# Patient Record
Sex: Male | Born: 1935
Health system: Southern US, Community
[De-identification: ages and names within clinical notes are randomized; demographics above are authoritative.]

## PROBLEM LIST (undated history)

## (undated) DIAGNOSIS — I4892 Unspecified atrial flutter: Secondary | ICD-10-CM

## (undated) DIAGNOSIS — E119 Type 2 diabetes mellitus without complications: Secondary | ICD-10-CM

## (undated) DIAGNOSIS — N529 Male erectile dysfunction, unspecified: Secondary | ICD-10-CM

## (undated) DIAGNOSIS — M1990 Unspecified osteoarthritis, unspecified site: Secondary | ICD-10-CM

## (undated) DIAGNOSIS — I2699 Other pulmonary embolism without acute cor pulmonale: Secondary | ICD-10-CM

## (undated) DIAGNOSIS — I428 Other cardiomyopathies: Secondary | ICD-10-CM

## (undated) DIAGNOSIS — I509 Heart failure, unspecified: Secondary | ICD-10-CM

## (undated) DIAGNOSIS — I447 Left bundle-branch block, unspecified: Secondary | ICD-10-CM

## (undated) DIAGNOSIS — I951 Orthostatic hypotension: Secondary | ICD-10-CM

## (undated) DIAGNOSIS — K635 Polyp of colon: Secondary | ICD-10-CM

## (undated) DIAGNOSIS — I10 Essential (primary) hypertension: Secondary | ICD-10-CM

## (undated) DIAGNOSIS — E785 Hyperlipidemia, unspecified: Secondary | ICD-10-CM

## (undated) DIAGNOSIS — Z950 Presence of cardiac pacemaker: Secondary | ICD-10-CM

## (undated) DIAGNOSIS — B019 Varicella without complication: Secondary | ICD-10-CM

## (undated) DIAGNOSIS — I495 Sick sinus syndrome: Secondary | ICD-10-CM

## (undated) DIAGNOSIS — M5020 Other cervical disc displacement, unspecified cervical region: Secondary | ICD-10-CM

## (undated) DIAGNOSIS — N400 Enlarged prostate without lower urinary tract symptoms: Secondary | ICD-10-CM

## (undated) DIAGNOSIS — D039 Melanoma in situ, unspecified: Secondary | ICD-10-CM

## (undated) HISTORY — PX: CYSTOSCOPY W/ TRANSURETHRAL RESECTION OF POSTERIOR URETHERAL VALVES: SHX1428

## (undated) HISTORY — PX: PACEMAKER INSERTION: SHX728

## (undated) HISTORY — DX: Type 2 diabetes mellitus without complications: E11.9

## (undated) HISTORY — DX: Male erectile dysfunction, unspecified: N52.9

## (undated) HISTORY — DX: Presence of cardiac pacemaker: Z95.0

## (undated) HISTORY — DX: Sick sinus syndrome: I49.5

## (undated) HISTORY — DX: Hyperlipidemia, unspecified: E78.5

## (undated) HISTORY — DX: Other cardiomyopathies: I42.8

## (undated) HISTORY — DX: Gilbert syndrome: E80.4

## (undated) HISTORY — DX: Left bundle-branch block, unspecified: I44.7

## (undated) HISTORY — DX: Polyp of colon: K63.5

## (undated) HISTORY — DX: Heart failure, unspecified: I50.9

## (undated) HISTORY — DX: Essential (primary) hypertension: I10

## (undated) HISTORY — DX: Varicella without complication: B01.9

## (undated) HISTORY — PX: CARDIOVERSION: SHX1299

## (undated) HISTORY — DX: Other pulmonary embolism without acute cor pulmonale: I26.99

## (undated) HISTORY — DX: Other cervical disc displacement, unspecified cervical region: M50.20

## (undated) HISTORY — DX: Orthostatic hypotension: I95.1

## (undated) HISTORY — DX: Melanoma in situ, unspecified: D03.9

## (undated) HISTORY — DX: Benign prostatic hyperplasia without lower urinary tract symptoms: N40.0

## (undated) HISTORY — DX: Unspecified osteoarthritis, unspecified site: M19.90

## (undated) HISTORY — DX: Unspecified atrial flutter: I48.92

## (undated) HISTORY — PX: OTHER SURGICAL HISTORY: SHX169

---

## 2002-10-21 ENCOUNTER — Encounter: Admission: RE | Admit: 2002-10-21 | Discharge: 2002-10-21 | Payer: Self-pay | Admitting: Orthopedic Surgery

## 2002-10-21 ENCOUNTER — Encounter: Payer: Self-pay | Admitting: Orthopedic Surgery

## 2002-11-26 ENCOUNTER — Emergency Department (HOSPITAL_COMMUNITY): Admission: EM | Admit: 2002-11-26 | Discharge: 2002-11-26 | Payer: Self-pay | Admitting: Emergency Medicine

## 2004-08-09 ENCOUNTER — Ambulatory Visit: Payer: Self-pay | Admitting: Cardiology

## 2004-08-09 ENCOUNTER — Inpatient Hospital Stay (HOSPITAL_COMMUNITY): Admission: EM | Admit: 2004-08-09 | Discharge: 2004-08-12 | Payer: Self-pay | Admitting: Emergency Medicine

## 2004-08-10 ENCOUNTER — Encounter: Payer: Self-pay | Admitting: Cardiology

## 2004-08-29 ENCOUNTER — Ambulatory Visit: Payer: Self-pay

## 2004-08-29 ENCOUNTER — Ambulatory Visit: Payer: Self-pay | Admitting: Internal Medicine

## 2004-11-29 ENCOUNTER — Ambulatory Visit: Payer: Self-pay | Admitting: Internal Medicine

## 2004-12-07 ENCOUNTER — Ambulatory Visit: Payer: Self-pay

## 2005-02-01 ENCOUNTER — Ambulatory Visit: Payer: Self-pay | Admitting: Gastroenterology

## 2005-02-16 ENCOUNTER — Ambulatory Visit: Payer: Self-pay | Admitting: Gastroenterology

## 2005-04-26 ENCOUNTER — Ambulatory Visit: Payer: Self-pay | Admitting: Cardiology

## 2005-08-21 ENCOUNTER — Ambulatory Visit: Payer: Self-pay

## 2006-01-10 ENCOUNTER — Emergency Department (HOSPITAL_COMMUNITY): Admission: EM | Admit: 2006-01-10 | Discharge: 2006-01-10 | Payer: Self-pay | Admitting: Emergency Medicine

## 2006-09-03 ENCOUNTER — Ambulatory Visit: Payer: Self-pay

## 2006-12-06 ENCOUNTER — Emergency Department (HOSPITAL_COMMUNITY): Admission: EM | Admit: 2006-12-06 | Discharge: 2006-12-07 | Payer: Self-pay | Admitting: Emergency Medicine

## 2006-12-19 ENCOUNTER — Encounter (INDEPENDENT_AMBULATORY_CARE_PROVIDER_SITE_OTHER): Payer: Self-pay | Admitting: Urology

## 2006-12-19 ENCOUNTER — Ambulatory Visit (HOSPITAL_COMMUNITY): Admission: RE | Admit: 2006-12-19 | Discharge: 2006-12-20 | Payer: Self-pay | Admitting: Urology

## 2006-12-25 ENCOUNTER — Emergency Department (HOSPITAL_COMMUNITY): Admission: EM | Admit: 2006-12-25 | Discharge: 2006-12-25 | Payer: Self-pay | Admitting: Emergency Medicine

## 2006-12-25 ENCOUNTER — Ambulatory Visit: Payer: Self-pay | Admitting: Internal Medicine

## 2007-04-26 ENCOUNTER — Ambulatory Visit: Payer: Self-pay | Admitting: Cardiology

## 2007-05-01 ENCOUNTER — Encounter: Payer: Self-pay | Admitting: Cardiology

## 2007-05-01 ENCOUNTER — Ambulatory Visit: Payer: Self-pay

## 2007-05-23 ENCOUNTER — Ambulatory Visit: Payer: Self-pay | Admitting: Cardiology

## 2007-06-05 ENCOUNTER — Ambulatory Visit: Payer: Self-pay | Admitting: Internal Medicine

## 2007-06-25 ENCOUNTER — Ambulatory Visit: Payer: Self-pay | Admitting: Cardiology

## 2007-08-08 ENCOUNTER — Ambulatory Visit: Payer: Self-pay | Admitting: Cardiology

## 2007-08-21 ENCOUNTER — Ambulatory Visit: Payer: Self-pay

## 2007-09-16 ENCOUNTER — Ambulatory Visit: Payer: Self-pay | Admitting: Cardiology

## 2007-11-20 ENCOUNTER — Ambulatory Visit: Payer: Self-pay | Admitting: Internal Medicine

## 2007-11-22 ENCOUNTER — Ambulatory Visit: Payer: Self-pay | Admitting: Cardiology

## 2008-02-18 ENCOUNTER — Ambulatory Visit: Payer: Self-pay | Admitting: Cardiology

## 2008-02-19 ENCOUNTER — Inpatient Hospital Stay (HOSPITAL_COMMUNITY): Admission: EM | Admit: 2008-02-19 | Discharge: 2008-02-24 | Payer: Self-pay | Admitting: Emergency Medicine

## 2008-02-20 ENCOUNTER — Ambulatory Visit: Payer: Self-pay | Admitting: Vascular Surgery

## 2008-02-20 ENCOUNTER — Encounter (INDEPENDENT_AMBULATORY_CARE_PROVIDER_SITE_OTHER): Payer: Self-pay | Admitting: Internal Medicine

## 2008-02-27 ENCOUNTER — Ambulatory Visit: Payer: Self-pay

## 2008-03-19 ENCOUNTER — Ambulatory Visit: Payer: Self-pay | Admitting: Cardiology

## 2008-04-01 ENCOUNTER — Ambulatory Visit: Payer: Self-pay | Admitting: Internal Medicine

## 2008-04-03 ENCOUNTER — Ambulatory Visit (HOSPITAL_COMMUNITY): Admission: RE | Admit: 2008-04-03 | Discharge: 2008-04-03 | Payer: Self-pay | Admitting: Cardiology

## 2008-06-03 ENCOUNTER — Encounter: Payer: Self-pay | Admitting: Internal Medicine

## 2008-06-03 ENCOUNTER — Ambulatory Visit: Payer: Self-pay | Admitting: Internal Medicine

## 2008-06-17 ENCOUNTER — Encounter: Payer: Self-pay | Admitting: Internal Medicine

## 2008-06-22 ENCOUNTER — Ambulatory Visit: Payer: Self-pay | Admitting: Internal Medicine

## 2008-08-27 DIAGNOSIS — I82409 Acute embolism and thrombosis of unspecified deep veins of unspecified lower extremity: Secondary | ICD-10-CM | POA: Insufficient documentation

## 2008-08-27 DIAGNOSIS — I2699 Other pulmonary embolism without acute cor pulmonale: Secondary | ICD-10-CM

## 2008-08-27 DIAGNOSIS — I509 Heart failure, unspecified: Secondary | ICD-10-CM | POA: Insufficient documentation

## 2008-08-27 DIAGNOSIS — I428 Other cardiomyopathies: Secondary | ICD-10-CM

## 2008-08-27 DIAGNOSIS — E785 Hyperlipidemia, unspecified: Secondary | ICD-10-CM | POA: Insufficient documentation

## 2008-08-27 DIAGNOSIS — K649 Unspecified hemorrhoids: Secondary | ICD-10-CM | POA: Insufficient documentation

## 2008-08-31 ENCOUNTER — Ambulatory Visit: Payer: Self-pay | Admitting: Internal Medicine

## 2008-08-31 DIAGNOSIS — Z860100 Personal history of colon polyps, unspecified: Secondary | ICD-10-CM | POA: Insufficient documentation

## 2008-08-31 DIAGNOSIS — R159 Full incontinence of feces: Secondary | ICD-10-CM | POA: Insufficient documentation

## 2008-09-03 ENCOUNTER — Ambulatory Visit: Payer: Self-pay | Admitting: Internal Medicine

## 2008-09-03 ENCOUNTER — Encounter: Payer: Self-pay | Admitting: Internal Medicine

## 2008-09-07 ENCOUNTER — Encounter: Payer: Self-pay | Admitting: Internal Medicine

## 2008-09-09 ENCOUNTER — Telehealth: Payer: Self-pay | Admitting: Internal Medicine

## 2008-09-14 ENCOUNTER — Ambulatory Visit: Payer: Self-pay

## 2008-09-14 ENCOUNTER — Encounter: Payer: Self-pay | Admitting: Internal Medicine

## 2008-10-01 ENCOUNTER — Encounter: Payer: Self-pay | Admitting: Internal Medicine

## 2008-10-06 ENCOUNTER — Encounter: Payer: Self-pay | Admitting: Internal Medicine

## 2008-10-06 ENCOUNTER — Ambulatory Visit: Payer: Self-pay | Admitting: Internal Medicine

## 2008-10-06 DIAGNOSIS — R55 Syncope and collapse: Secondary | ICD-10-CM

## 2008-10-06 DIAGNOSIS — I447 Left bundle-branch block, unspecified: Secondary | ICD-10-CM

## 2008-10-06 DIAGNOSIS — I495 Sick sinus syndrome: Secondary | ICD-10-CM | POA: Insufficient documentation

## 2008-12-03 ENCOUNTER — Ambulatory Visit: Payer: Self-pay | Admitting: Internal Medicine

## 2008-12-30 ENCOUNTER — Encounter: Payer: Self-pay | Admitting: Internal Medicine

## 2009-01-05 ENCOUNTER — Encounter: Payer: Self-pay | Admitting: Internal Medicine

## 2009-01-06 ENCOUNTER — Ambulatory Visit: Payer: Self-pay | Admitting: Internal Medicine

## 2009-03-24 ENCOUNTER — Encounter (INDEPENDENT_AMBULATORY_CARE_PROVIDER_SITE_OTHER): Payer: Self-pay | Admitting: *Deleted

## 2009-04-09 ENCOUNTER — Encounter: Payer: Self-pay | Admitting: Internal Medicine

## 2009-04-11 ENCOUNTER — Encounter: Payer: Self-pay | Admitting: Internal Medicine

## 2009-04-12 ENCOUNTER — Ambulatory Visit: Payer: Self-pay | Admitting: Internal Medicine

## 2009-04-14 ENCOUNTER — Telehealth: Payer: Self-pay | Admitting: Internal Medicine

## 2009-04-26 ENCOUNTER — Encounter: Payer: Self-pay | Admitting: Internal Medicine

## 2009-05-24 ENCOUNTER — Ambulatory Visit: Payer: Self-pay | Admitting: Cardiology

## 2009-05-24 ENCOUNTER — Encounter: Payer: Self-pay | Admitting: Internal Medicine

## 2009-05-24 ENCOUNTER — Ambulatory Visit (HOSPITAL_COMMUNITY): Admission: RE | Admit: 2009-05-24 | Discharge: 2009-05-24 | Payer: Self-pay | Admitting: Internal Medicine

## 2009-05-24 ENCOUNTER — Ambulatory Visit: Payer: Self-pay | Admitting: Internal Medicine

## 2009-05-24 DIAGNOSIS — I10 Essential (primary) hypertension: Secondary | ICD-10-CM | POA: Insufficient documentation

## 2009-07-09 ENCOUNTER — Encounter: Payer: Self-pay | Admitting: Internal Medicine

## 2009-07-10 ENCOUNTER — Encounter: Payer: Self-pay | Admitting: Internal Medicine

## 2009-07-14 ENCOUNTER — Ambulatory Visit: Payer: Self-pay | Admitting: Internal Medicine

## 2009-07-16 ENCOUNTER — Encounter: Payer: Self-pay | Admitting: Internal Medicine

## 2009-07-21 ENCOUNTER — Encounter: Payer: Self-pay | Admitting: Internal Medicine

## 2009-09-08 ENCOUNTER — Encounter: Payer: Self-pay | Admitting: Internal Medicine

## 2009-09-27 ENCOUNTER — Encounter: Payer: Self-pay | Admitting: Internal Medicine

## 2009-09-28 ENCOUNTER — Ambulatory Visit: Payer: Self-pay | Admitting: Internal Medicine

## 2009-11-17 ENCOUNTER — Ambulatory Visit: Payer: Self-pay | Admitting: Internal Medicine

## 2009-12-30 ENCOUNTER — Ambulatory Visit: Payer: Self-pay | Admitting: Internal Medicine

## 2010-01-02 ENCOUNTER — Encounter: Payer: Self-pay | Admitting: Internal Medicine

## 2010-01-05 ENCOUNTER — Encounter: Payer: Self-pay | Admitting: Internal Medicine

## 2010-04-10 ENCOUNTER — Encounter: Payer: Self-pay | Admitting: Internal Medicine

## 2010-04-15 ENCOUNTER — Encounter: Payer: Self-pay | Admitting: Internal Medicine

## 2010-04-20 ENCOUNTER — Telehealth (INDEPENDENT_AMBULATORY_CARE_PROVIDER_SITE_OTHER): Payer: Self-pay | Admitting: *Deleted

## 2010-04-20 ENCOUNTER — Telehealth: Payer: Self-pay | Admitting: Internal Medicine

## 2010-04-25 ENCOUNTER — Ambulatory Visit: Payer: Self-pay

## 2010-04-25 ENCOUNTER — Encounter: Payer: Self-pay | Admitting: Internal Medicine

## 2010-05-20 ENCOUNTER — Ambulatory Visit (HOSPITAL_COMMUNITY)
Admission: RE | Admit: 2010-05-20 | Discharge: 2010-05-20 | Payer: Self-pay | Source: Home / Self Care | Admitting: Internal Medicine

## 2010-05-20 ENCOUNTER — Ambulatory Visit: Payer: Self-pay | Admitting: Cardiology

## 2010-05-20 ENCOUNTER — Encounter: Payer: Self-pay | Admitting: Internal Medicine

## 2010-05-20 ENCOUNTER — Ambulatory Visit: Payer: Self-pay | Admitting: Internal Medicine

## 2010-07-19 NOTE — Progress Notes (Signed)
Summary: [facility]  [facility]   Imported By: Cala Bradford Mesiemore 04/20/2010 15:20:03  _____________________________________________________________________  External Attachment:    Type:   Image     Comment:   External Document

## 2010-07-19 NOTE — Cardiovascular Report (Signed)
Summary: Office Visit Remote   Office Visit Remote   Imported By: Roderic Ovens 07/26/2009 16:43:11  _____________________________________________________________________  External Attachment:    Type:   Image     Comment:   External Document

## 2010-07-19 NOTE — Progress Notes (Signed)
Summary: Med List   Med List   Imported By: Roderic Ovens 09/27/2009 15:20:28  _____________________________________________________________________  External Attachment:    Type:   Image     Comment:   External Document

## 2010-07-19 NOTE — Progress Notes (Signed)
   Walk in Patient Form Recieved " Pt left notes from Hospitalization" sent to Herbert Seta S this message was taken on 10/25...and ended up in paperwork..I just received today. [facility] Mesiemore  April 20, 2010 8:48 AM

## 2010-07-19 NOTE — Progress Notes (Signed)
   Phone Note Call from Patient   Summary of Call: pt called about delinquent letter ---device was checked on 04-08-10 at [facility].  device functioning normally.  scheduled pt for next remote transmission on 07-14-10.  pt aware of date. Vella Kohler  April 20, 2010 11:21 AM

## 2010-07-19 NOTE — Assessment & Plan Note (Signed)
Summary: pacer check.mdt.amber  Medications Added VITAMIN D 1000 UNIT  TABS (CHOLECALCIFEROL) once daily      Allergies Added: NKDA  Primary Provider:  Rodrigo Ran MD   History of Present Illness: History of Present Illness: Steven Macdonald is seen in followup for a pacemaker implant for severe bradycardia left bundle branch block and syncope. He has had no recurrent syncope.  He also is a history of pulmonary embolism  He has also had problems with a nonischemic cardiomyopathy with ejection fraction last fall of 31%. Echocardiogram from December demonstrated stable ejection fraction of 45-50%. The patient has modest fatigue but no significant shortness of breath. He is tolerating his medications well.  [place] has [place]     Current Medications (verified): 1)  Simvastatin 40 Mg Tabs (Simvastatin) .... 1/2 Tablet By Mouth Bid 2)  Lisinopril 10 Mg Tabs (Lisinopril) .Marland Kitchen.. 1 By Mouth Two Times A Day 3)  Coumadin 5 Mg Tabs (Warfarin Sodium) .... Take As Directed 4)  Coreg 25 Mg Tabs (Carvedilol) .Marland Kitchen.. 1 Tablet By Mouth Two Times A Day 5)  Viagra 100 Mg Tabs (Sildenafil Citrate) .... As Needed 6)  Fish Oil  Oil (Fish Oil) .... 1000mg  Once Daily 7)  Qc Fiber Laxative 0.52 Gm Caps (Psyllium) .... Take One Tablet By Mouth Once Daily. 8)  Vitamin D 1000 Unit  Tabs (Cholecalciferol) .... Once Daily  Allergies (verified): No Known Drug Allergies  Vital Signs:  Patient profile:   76 year old male Height:      76 inches Weight:      183 pounds BMI:     22.36 Pulse rate:   69 / minute BP sitting:   109 / 62  (right arm) Cuff size:   regular  Vitals Entered By: Steven Macdonald, RMA (September 28, 2009 11:08 AM)  Physical Exam  General:  The patient was alert and oriented in no acute distress. HEENT Normal.  Neck veins were flat, carotids were brisk.  Lungs were clear.  Heart sounds were regular without murmurs or gallops.  Abdomen was soft  with active bowel sounds. There is no clubbing cyanosis or edema. Skin Warm and dry    PPM Specifications Following MD:  Steven Manges, MD     PPM Vendor:  Medtronic     PPM Model Number:  [id number]     PPM Serial Number:  [id number] PPM DOI:  08/11/2004     PPM Implanting MD:  Steven Manges, MD  Lead 1    Location: RA     DOI: 08/11/2004     Model #: [id number]     Serial #: [id number]     Status: active Lead 2    Location: RV     DOI: 08/11/2004     Model #: 3875     Serial #: [id number]     Status: active  Magnet Response Rate:  BOL 85 ERI 65  Indications:  Syncope; Sinus node dysfunction Carelink   PPM Follow Up Remote Check?  No Battery Voltage:  2.77 V     Battery Est. Longevity:  5 years     Pacer Dependent:  Yes       PPM Device Measurements Atrium  Impedance: 512 ohms, Threshold: 0.5 V at 0.4 msec Right Ventricle  Amplitude: 5.6 mV, Impedance: 570 ohms, Threshold: 0.75 V at 0.4 msec  Episodes MS Episodes:  0     Percent Mode Switch:  0  Coumadin:  Yes Ventricular High Rate:  0     Atrial Pacing:  97.6%     Ventricular Pacing:  0.3%  Parameters Mode:  DDDR     Lower Rate Limit:  60     Upper Rate Limit:  140 Paced AV Delay:  180     Sensed AV Delay:  150 Next Remote Date:  12/30/2009     Next Cardiology Appt Due:  09/18/2010 Tech Comments:  2.5x safety margin for atrial autocapture.  Carelink transmissions every 3 months.  Checked by [employer].  ROV 1 year with Dr. Graciela Macdonald. Steven Harm, LPN  September 28, 2009 11:21 AM   Impression & Recommendations:  Problem # 1:  PACEMAKER, PERMANENT, MEDTRONIC ENPULSE (ICD-V45.01) Device parameters and data were reviewed and no changes were made  Problem # 2:  CARDIOMYOPATHY (ICD-425.4) largely improved ejection fraction of 50%    Problem # 3:  SICK SINUS/ TACHY-BRADY SYNDROME (ICD-427.81) newly 100% atrial paced with intrinsic intnsic conduction His updated medication list for this problem includes:    Lisinopril 10 Mg Tabs  (Lisinopril) .Marland Kitchen... 1 by mouth two times a day    Coumadin 5 Mg Tabs (Warfarin sodium) .Marland Kitchen... Take as directed    Coreg 25 Mg Tabs (Carvedilol) .Marland Kitchen... 1 tablet by mouth two times a day  Patient Instructions: 1)  Your physician wants you to follow-up in:12 months with Dr Steven Macdonald.   You will receive a reminder letter in the mail two months in advance. If you don't receive a letter, please call our office to schedule the follow-up appointment.

## 2010-07-19 NOTE — Letter (Signed)
Summary: [facility]   Imported By: Marylou Mccoy 02/10/2010 13:46:16  _____________________________________________________________________  External Attachment:    Type:   Image     Comment:   External Document

## 2010-07-19 NOTE — Miscellaneous (Signed)
  Clinical Lists Changes  Observations: Added new observation of ECHOINTERP:   - Left ventricle: There is hypokinesis of the inferior wall and some       dyssynergy related to pacing. There is mild upper septal       thickening. The cavity size was normal. Wall thickness was       increased in a pattern of mild LVH. The estimated ejection       fraction was 55%.     - Aorta: There is mild dilitation of the ascending aorta.     - Mitral valve: Mild regurgitation.     - Right ventricle: Pacer wire or catheter noted in right ventricle.     - Tricuspid valve: Mild regurgitation. (06/03/2009 15:45)      Echocardiogram  Procedure date:  06/03/2009  Findings:        - Left ventricle: There is hypokinesis of the inferior wall and some       dyssynergy related to pacing. There is mild upper septal       thickening. The cavity size was normal. Wall thickness was       increased in a pattern of mild LVH. The estimated ejection       fraction was 55%.     - Aorta: There is mild dilitation of the ascending aorta.     - Mitral valve: Mild regurgitation.     - Right ventricle: Pacer wire or catheter noted in right ventricle.     - Tricuspid valve: Mild regurgitation.

## 2010-07-19 NOTE — Cardiovascular Report (Signed)
Summary: Office Visit    Office Visit    Imported By: Roderic Ovens 09/28/2009 12:41:54  _____________________________________________________________________  External Attachment:    Type:   Image     Comment:   External Document

## 2010-07-19 NOTE — Letter (Signed)
Summary: [facility] Office Note  [facility] Office Note   Imported By: Roderic Ovens 09/27/2009 15:18:04  _____________________________________________________________________  External Attachment:    Type:   Image     Comment:   External Document

## 2010-07-19 NOTE — Letter (Signed)
Summary: Remote Device Check  [employer], Main Office  [address]   [place], [place] [address]   Phone: [phone]  Fax: [phone]     July 21, 2009 MRN: [id number]   [facility] [address] [place], [place]  [address]   Dear Steven Macdonald,   Your remote transmission was recieved and reviewed by your physician.  All diagnostics were within normal limits for you.    __X____Your next office visit is scheduled for:    APRIL 2011 WITH DR Graciela Husbands. Please call our office to schedule an appointment.    Sincerely,  Proofreader

## 2010-07-19 NOTE — Progress Notes (Signed)
Summary: syncopal episode   Phone Note Outgoing Call   Call placed by: Meredith Staggers, RN,  April 20, 2010 3:01 PM Call placed to: Patient Summary of Call: called pt regarding his mess he had left about being in hospital.  He states he was in [place] and had 2 syncopal episodes on 10/21.  He states he had a very busy day and had forgotten to eat lunch, he had a drink before dinner and then 2 glasses of wine w/dinner which is a little more than he normally has.  When he got up to go up to his room he passed out, after he got up he went to elevated and passed out again.  He states EMS was called and he was transported to hospital BP was low at 80/40 for a while, they did a lot of test including labs, cardiac enzymes, and head CT all of which were normal.  He also reports a device rep came in and checked his pacer which was normal as well.  Since then pt has been fine w/no problems, he has been doing normal activities including going to the gym w/no problem.  He is sch to see Dr Gala Romney 12/2 and is fine w/that appt, he just wanted Dr Gala Romney to be aware of what had happened, will forward mess to him.     Appended Document: syncopal episode please have our device guys look at his pacer and confirm no events. thanks  Appended Document: syncopal episode pt aware appt sch for 11/7 at 9:15 w/device clinic

## 2010-07-19 NOTE — Letter (Signed)
Summary: Device-Delinquent Phone Journalist, newspaper, Main Office  [address]   [place], [place] [address]   Phone: [phone]  Fax: [phone]     April 15, 2010 MRN: [id number]   [facility] [address] [place], [place]  [address]   Dear Steven Macdonald,  According to our records, you were scheduled for a device phone transmission on 04-07-2010.     We did not receive any results from this check.  If you transmitted on your scheduled day, please call us to help troubleshoot your system.  If you forgot to send your transmission, please send one upon receipt of this letter.  Thank you,   Architectural technologist [facility]

## 2010-07-19 NOTE — Cardiovascular Report (Signed)
Summary: Office Visit   Office Visit   Imported By: Roderic Ovens 04/28/2010 14:29:16  _____________________________________________________________________  External Attachment:    Type:   Image     Comment:   External Document

## 2010-07-19 NOTE — Assessment & Plan Note (Signed)
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 Normal coronaries on cardiac CT in 209 with EF 31%.  This spring spent 30 [place] on [place] with 50 pound pack. Near end of trip was feeling weak and dizzy. Went to primary care office. CKs elevated so sent to ER. Diagnosed with dehydration and exhaustion. Drank a lot of fluids and went [place] another [place].   Returns for routine f/u. In October was in [place] and was walking for miles. Didn't eat all day until dinner and had a big dinner with wine. After getting up from dinner had syncopal episode. Went back toward hotel room and had recurrent episode.  Went to [employer]. Pacer interrogated and was normal. Cardiac enzymes and head CT all normal.  Now back to his regular routine. Exercising every day without problem. Compliant with all meds. No CP, SOB or edema. Doing very well.   Echo today which I reviewed personally shows EF 30-35%   Current Medications (verified): 1)  Simvastatin 40 Mg Tabs (Simvastatin) .... 1/2 Tablet By Mouth Bid 2)  Lisinopril 10 Mg Tabs (Lisinopril) .Marland Kitchen.. 1 By Mouth Two Times A Day 3)  Coumadin 5 Mg Tabs (Warfarin Sodium) .... Take As Directed 4)  Coreg 25 Mg Tabs (Carvedilol) .Marland Kitchen.. 1 Tablet By Mouth Two Times A Day 5)  Fish Oil  Oil (Fish Oil) .... 1000mg  Once Daily 6)  Qc Fiber Laxative 0.52 Gm Caps (Psyllium) .... Take One Tablet By Mouth Once Daily. 7)  Vitamin D 1000 Unit  Tabs (Cholecalciferol) .... Once Daily  Allergies (verified): No Known Drug Allergies  Past History:  Past Medical  History: Last updated: 05/24/2009 Hyperlipidemia Hypertension Pulmonary Embolism 9/09 Nonischemic cardiomyopsthy   --ECHO 11/08 EF 25%   --Cardiac CT 10/09: EF 31%. Normal coronaries.   --ECHO 3/10: EF 45% Asc aorta mildly dilated 42mm   --ECHO12/10 EF 40-45% LBBB Symptomatic bradycardia s/p pacemaker 2006 Erectile dysfunction Hemorrhoids  Review of Systems       As per HPI and past medical history; otherwise all systems negative.   Vital Signs:  Patient profile:   76 year old male Height:      75.5 inches Weight:      180 pounds BMI:     22.28 Pulse rate:   65 / minute BP sitting:   100 / 70  (left arm)  Vitals Entered By: Laurance Flatten CMA (May 20, 2010 10:22 AM)  Physical Exam  General:  The patient was alert and oriented in no acute distress. HEENT Normal.  Neck veins were flat, carotids were brisk.  Lungs were clear.  Heart sounds were regular without murmurs or gallops.  Abdomen was soft with active bowel sounds. There is no clubbing cyanosis or edema. Skin Warm and dry    PPM Specifications Following MD:  Sherryl Manges, MD     PPM Vendor:  [employer]     PPM Model Number:  [id number]     PPM Serial Number:  [id number] PPM DOI:  08/11/2004     PPM Implanting MD:  Sherryl Manges, MD  Lead 1    Location: RA     DOI: 08/11/2004     Model #: 1191     Serial #: [id number]     Status: active Lead 2    Location: RV     DOI: 08/11/2004     Model #: 3086     Serial #: [id number]     Status: active  Magnet Response Rate:  BOL 85 ERI 65  Indications:  Syncope; Sinus node dysfunction Carelink   PPM Follow Up Pacer Dependent:  Yes      Episodes Coumadin:  Yes  Parameters Mode:  DDDR     Lower Rate Limit:  60     Upper Rate Limit:  140 Paced AV Delay:  180     Sensed AV Delay:  150  Impression & Recommendations:  Problem # 1:  SYNCOPE (ICD-780.2) I suspect this was due to vasodilation and orthostasis. Pacer interrogation reassuring.   Problem # 2:   CARDIOMYOPATHY (ICD-425.4) EF appears sllightly decreased from previous on echo today. However remains NYHA I so not ICD candidate. Will continue current regimen and see him back in 2-3 months with repeat echo. If developing symptoms needs to contact me sooner.   Other Orders: EKG w/ Interpretation (93[address]) Echocardiogram (Echo)  Patient Instructions: 1)  Your physician recommends that you schedule a follow-up appointment in: 3 months for appointment with Dr. Garth Schlatter and echo 2)  Your physician recommends that you continue on your current medications as directed. Please refer to the Current Medication list given to you today. 3)  Your physician has requested that you have an echocardiogram.  Echocardiography is a painless test that uses sound waves to create images of your heart. It provides your doctor with information about the size and shape of your heart and how well your heart's chambers and valves are working.  This procedure takes approximately one hour. There are no restrictions for this procedure.

## 2010-07-19 NOTE — Cardiovascular Report (Signed)
Summary: Office Visit Remote   Office Visit Remote   Imported By: Roderic Ovens 01/06/2010 10:53:32  _____________________________________________________________________  External Attachment:    Type:   Image     Comment:   External Document

## 2010-07-19 NOTE — Letter (Signed)
Summary: Remote Device Check  [employer], Main Office  [address]   [place], [place] [address]   Phone: [phone]  Fax: [phone]     January 05, 2010 MRN: [id number]   [facility] [address] [place], [place]  [address]   Dear Steven Macdonald,   Your remote transmission was recieved and reviewed by your physician.  All diagnostics were within normal limits for you.  __X___Your next transmission is scheduled for:   04-07-2010.  Please transmit at any time this day.  If you have a wireless device your transmission will be sent automatically.   Sincerely,  Vella Kohler

## 2010-07-19 NOTE — Assessment & Plan Note (Signed)
Summary: f47m   Primary Provider:  Rodrigo Ran MD  CC:  ROV; No complaints.  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  This spring spent 30 days on [place] with 50 pound pack. Near end of trip was feeling weak and dizzy. Went to primary care office. CKs elevated so sent to ER. Diagnosed with dehydration and exhaustion. Drank a lot of fluids and went back to [place] another week.   Now back to his regular routine. Compliant with all meds. No CP, SOB or edema. Doing very well.   Current Medications (verified): 1)  Simvastatin 40 Mg Tabs (Simvastatin) .... 1/2 Tablet By Mouth Bid 2)  Lisinopril 10 Mg Tabs (Lisinopril) .Marland Kitchen.. 1 By Mouth Two Times A Day 3)  Coumadin 5 Mg Tabs (Warfarin Sodium) .... Take As Directed 4)  Coreg 25 Mg Tabs (Carvedilol) .Marland Kitchen.. 1 Tablet By Mouth Two Times A Day 5)  Viagra 100 Mg Tabs (Sildenafil Citrate) .... As Needed 6)  Fish Oil  Oil (Fish Oil) .... 1000mg  Once Daily 7)  Qc Fiber Laxative 0.52 Gm Caps (Psyllium) .... Take One Tablet By Mouth Once Daily. 8)  Vitamin D 1000 Unit  Tabs (Cholecalciferol) .... Once Daily  Allergies (verified): No Known Drug Allergies  Past History:  Past Medical History: Reviewed history from 05/24/2009 and no changes required. Hyperlipidemia Hypertension Pulmonary Embolism 9/09 Nonischemic cardiomyopsthy   --ECHO 11/08 EF 25%   --Cardiac CT 10/09: EF 31%. Normal coronaries.   --ECHO 3/10: EF 45% Asc aorta mildly dilated 42mm   --ECHO12/10 EF 40-45% LBBB Symptomatic bradycardia s/p pacemaker 2006 Erectile dysfunction Hemorrhoids  Review of Systems       As per HPI and past medical history; otherwise all systems  negative.   Vital Signs:  Patient profile:   76 year old male Height:      75.5 inches Weight:      182.75 pounds BMI:     22.62 Pulse rate:   62 / minute Pulse rhythm:   regular BP sitting:   104 / 60  (left arm) Cuff size:   regular  Vitals Entered By: Stanton Kidney, EMT-P (November 17, 2009 9:43 AM)  Physical Exam  General:  The patient was alert and oriented in no acute distress. HEENT Normal.  Neck veins were flat, carotids were brisk.  Lungs were clear.  Heart sounds were regular without murmurs or gallops.  Abdomen was soft with active bowel sounds. There is no clubbing cyanosis or edema. Skin Warm and dry    PPM Specifications Following MD:  Sherryl Manges, MD     PPM Vendor:  [employer]     PPM Model Number:  [id number]     PPM Serial Number:  [id number] PPM DOI:  08/11/2004     PPM Implanting MD:  Sherryl Manges, MD  Lead 1    Location: RA     DOI: 08/11/2004     Model #: [id number]     Serial #: [id number]     Status: active Lead 2    Location: RV     DOI: 08/11/2004     Model #: 1093     Serial #: [id number]     Status: active  Magnet Response Rate:  BOL 85 ERI 65  Indications:  Syncope; Sinus node dysfunction Carelink   PPM Follow Up Pacer Dependent:  Yes      Episodes Coumadin:  Yes  Parameters Mode:  DDDR     Lower Rate Limit:  60     Upper Rate Limit:  140 Paced AV Delay:  180     Sensed AV Delay:  150  Impression & Recommendations:  Problem # 1:  CARDIOMYOPATHY (ICD-425.4) Doing very well. NYHA class I with only mild LV dysfunction. On good meds. Continue current regimen. F/u in 6 months with echo.  Other Orders: EKG w/ Interpretation ([address])  Patient Instructions: 1)  Your physician has requested that you have an echocardiogram.  Echocardiography is a painless test that uses sound waves to create images of your heart. It provides your doctor with information about the size and shape of your heart and how well your heart's chambers and valves are working.  This  procedure takes approximately one hour. There are no restrictions for this procedure.  Needs in 6 months 2)  Your physician wants you to follow-up in:  6 months.  You will receive a reminder letter in the mail two months in advance. If you don't receive a letter, please call our office to schedule the follow-up appointment.

## 2010-08-04 ENCOUNTER — Encounter (INDEPENDENT_AMBULATORY_CARE_PROVIDER_SITE_OTHER): Payer: Self-pay | Admitting: *Deleted

## 2010-08-10 NOTE — Letter (Signed)
Summary: Device-Delinquent Phone Journalist, newspaper, Main Office  [address]   [place], [place] [address]   Phone: [phone]  Fax: [phone]     August 04, 2010 MRN: [id number]   [facility] [place] [address] [place], [place]  [address]   Dear Steven Macdonald,  According to our records, you were scheduled for a device phone transmission on  07-28-10.     We did not receive any results from this check.  If you transmitted on your scheduled day, please call us to help troubleshoot your system.  If you forgot to send your transmission, please send one upon receipt of this letter.  Thank you,   Architectural technologist [facility]

## 2010-08-19 ENCOUNTER — Ambulatory Visit: Payer: Self-pay | Admitting: Internal Medicine

## 2010-08-19 ENCOUNTER — Other Ambulatory Visit (HOSPITAL_COMMUNITY): Payer: Self-pay

## 2010-08-20 ENCOUNTER — Encounter: Payer: Self-pay | Admitting: Internal Medicine

## 2010-08-22 ENCOUNTER — Encounter (INDEPENDENT_AMBULATORY_CARE_PROVIDER_SITE_OTHER): Payer: Medicare Other

## 2010-08-22 DIAGNOSIS — I495 Sick sinus syndrome: Secondary | ICD-10-CM

## 2010-08-26 ENCOUNTER — Encounter: Payer: Self-pay | Admitting: Internal Medicine

## 2010-09-07 ENCOUNTER — Other Ambulatory Visit (HOSPITAL_COMMUNITY): Payer: Self-pay | Admitting: Internal Medicine

## 2010-09-07 DIAGNOSIS — I428 Other cardiomyopathies: Secondary | ICD-10-CM

## 2010-09-08 ENCOUNTER — Ambulatory Visit (INDEPENDENT_AMBULATORY_CARE_PROVIDER_SITE_OTHER): Payer: Medicare Other | Admitting: Internal Medicine

## 2010-09-08 ENCOUNTER — Encounter: Payer: Self-pay | Admitting: Internal Medicine

## 2010-09-08 ENCOUNTER — Ambulatory Visit (HOSPITAL_BASED_OUTPATIENT_CLINIC_OR_DEPARTMENT_OTHER): Payer: Medicare Other | Admitting: Radiology

## 2010-09-08 VITALS — BP 98/68 | HR 69 | Resp 14 | Wt 186.0 lb

## 2010-09-08 DIAGNOSIS — E785 Hyperlipidemia, unspecified: Secondary | ICD-10-CM | POA: Insufficient documentation

## 2010-09-08 DIAGNOSIS — Z86718 Personal history of other venous thrombosis and embolism: Secondary | ICD-10-CM | POA: Insufficient documentation

## 2010-09-08 DIAGNOSIS — I428 Other cardiomyopathies: Secondary | ICD-10-CM | POA: Insufficient documentation

## 2010-09-08 DIAGNOSIS — I495 Sick sinus syndrome: Secondary | ICD-10-CM | POA: Insufficient documentation

## 2010-09-08 DIAGNOSIS — I10 Essential (primary) hypertension: Secondary | ICD-10-CM | POA: Insufficient documentation

## 2010-09-08 DIAGNOSIS — I4892 Unspecified atrial flutter: Secondary | ICD-10-CM | POA: Insufficient documentation

## 2010-09-08 DIAGNOSIS — I447 Left bundle-branch block, unspecified: Secondary | ICD-10-CM | POA: Insufficient documentation

## 2010-09-08 DIAGNOSIS — I059 Rheumatic mitral valve disease, unspecified: Secondary | ICD-10-CM | POA: Insufficient documentation

## 2010-09-08 DIAGNOSIS — I509 Heart failure, unspecified: Secondary | ICD-10-CM | POA: Insufficient documentation

## 2010-09-08 LAB — BASIC METABOLIC PANEL
CO2: 28 mEq/L (ref 19–32)
GFR: 71.16 mL/min (ref >60.00)
Glucose, Bld: 91 mg/dL (ref 70–99)
Potassium: 4.7 mEq/L (ref 3.5–5.1)
Sodium: 136 mEq/L (ref 135–145)

## 2010-09-08 LAB — POCT INR: INR: 2.4

## 2010-09-08 LAB — CBC WITH DIFFERENTIAL/PLATELET
Basophils Absolute: 0 10*3/uL (ref 0.0–0.1)
Basophils Relative: 0.6 % (ref 0.0–3.0)
Eosinophils Relative: 6.6 % — ABNORMAL HIGH (ref 0.0–5.0)
HCT: 42.9 % (ref 39.0–52.0)
Hemoglobin: 14.4 g/dL (ref 13.0–17.0)
Lymphs Abs: 3.1 10*3/uL (ref 0.7–4.0)
Monocytes Relative: 8.2 % (ref 3.0–12.0)
Neutro Abs: 3.3 10*3/uL (ref 1.4–7.7)
RBC: 4.93 Mil/uL (ref 4.22–5.81)
RDW: 15.3 % — ABNORMAL HIGH (ref 11.5–14.6)

## 2010-09-08 NOTE — Assessment & Plan Note (Signed)
New onset. INRs all therapeutic. Will plan DC-CV tomorrow and then f/u with Dr. Graciela Husbands for ablation. I discussed this at length with patient, his fiance and Dr. Graciela Husbands.

## 2010-09-08 NOTE — Assessment & Plan Note (Signed)
Steven Macdonald now has worsening of his LV function in the setting of good medical therapy. Fortunately he remains NYHA I without any signs/symptoms of CHF. I explained to him and his fiance that at this point he would not qualify for ICD. However, given dilated LV, worsening LV function and MR one has to consider whether or not he would benefit from an LV lead for resynchronization. I do think he would benefit from this and I discussed it with Dr. Graciela Husbands who feels similarly but once again by strict criteria he doesn't qualify given lack of symptoms. That said, Steven Macdonald would be willing to consider paying for it out of pocket if we think he will benefit significantly. He will f/u with Dr. Graciela Husbands to discuss. Certainly if his exercise tolerance diminishes would plan BiVICD at that point.   As far as worsening LV dysfunction, I cannot find any clear trigger except for the fact that he is in [place] (rate controllled) today. We will continue current meds (BP too low to titrate) and see if EF improves with DC-CV of AFL.  I do not think there is a role for LV biopsy at this time but may warrant further w/u with SPEP/UPEP.

## 2010-09-08 NOTE — Progress Notes (Signed)
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  He returns today with his fiance for routine f/u. They both have multiple questions about his condition and exercise regimen.  He continues to do very well. He is doing circuit weights routinely and walking every day. Typically walks on treadmilll at at 4% grade. Denies any CP or SOB whatsoever. Has not had any problems with palpitations, presyncope, edema, orthopnea or PND. Taking meds as prescribed. Drinks 1-2 glasses ETOH per night max.   Had echo today (which I reviewed personally and compared to previous studies). EF down a bit to 20-25% with significant LV dysynchrony with moderate MR and mod LAE.  ROS: All systems negative except as listed in HPI, PMH and Problem List.  Past Medical History  Diagnosis Date  . HLD (hyperlipidemia)   . HTN (hypertension)   . Pulmonary embolism   . Cardiomyopathy, nonischemic     Echo 2008: EF 25% Cardiac CT 2009: Normal cors EF 31% Echo 3/10 EF 45% Echo 3/12: EF 20-25% moderate MR (in setting of AFL)  . LBBB (left bundle branch block)   . Bradycardia     s/p PPM  . ED (erectile dysfunction)   . Hemorrhoids   . Atrial flutter     Onset 3/12    Current Outpatient Prescriptions  Medication Sig Dispense Refill  . carvedilol (COREG) 25 MG tablet Take 25 mg by mouth 2 (two) times daily with a meal.        . cholecalciferol (VITAMIN D) 1000 UNITS tablet Take 1,000 Units by mouth daily.        . fish oil-omega-3 fatty acids 1000 MG capsule 1 tab po qd      . lisinopril (PRINIVIL,ZESTRIL) 10 MG tablet Take 10 mg by mouth 2 (two) times daily.        . Psyllium (METAMUCIL PO) as needed.        . simvastatin (ZOCOR) 40 MG tablet Take 40 mg by mouth. 1/2  tb po qd       . warfarin (COUMADIN) 5 MG tablet Take 5 mg by mouth daily.        Marland Kitchen DISCONTD: simvastatin (ZOCOR) 40 MG tablet 1/2 tab po bid      . DISCONTD: warfarin (COUMADIN) 5 MG tablet Take 5 mg by mouth daily.        Marland Kitchen DISCONTD: carvedilol (COREG) 25 MG tablet Take 25 mg by mouth 2 (two) times daily with a meal.        . DISCONTD: diltiazem (CARDIZEM CD) 180 MG 24 hr capsule Take 180 mg by mouth daily.        Marland Kitchen DISCONTD: diphenhydrAMINE (BENADRYL) 25 MG tablet Take 25 mg by mouth every 6 (six) hours as needed.        Marland Kitchen DISCONTD: enalapril (VASOTEC) 10 MG tablet Take 10 mg by mouth 2 (two) times daily.        Marland Kitchen DISCONTD: etodolac (LODINE) 500 MG tablet Take 500 mg by mouth 2 (two) times daily.        Marland Kitchen DISCONTD: fexofenadine (ALLEGRA) 180 MG tablet Take 180 mg by mouth as needed.        Marland Kitchen DISCONTD: furosemide (LASIX) 20 MG tablet Take 20 mg by mouth as  needed.        Marland Kitchen DISCONTD: lisinopril (PRINIVIL,ZESTRIL) 10 MG tablet Take 10 mg by mouth 2 (two) times daily.        Marland Kitchen DISCONTD: methocarbamol (ROBAXIN) 750 MG tablet 2 tabs po qhs       . DISCONTD: metoprolol (LOPRESSOR) 50 MG tablet Take 50 mg by mouth 2 (two) times daily.        Marland Kitchen DISCONTD: mometasone (NASONEX) 50 MCG/ACT nasal spray 2 sprays by Nasal route daily.        Marland Kitchen DISCONTD: Multiple Vitamin (MULTIVITAMIN) capsule Take 1 capsule by mouth daily.        Marland Kitchen DISCONTD: nitroGLYCERIN (NITROSTAT) 0.4 MG SL tablet Place 0.4 mg under the tongue every 5 (five) minutes as needed.        Marland Kitchen DISCONTD: pravastatin (PRAVACHOL) 40 MG tablet Take 40 mg by mouth daily.        Marland Kitchen DISCONTD: psyllium (FIBER LAXATIVE) 0.52 G capsule Take 0.52 g by mouth as needed.          PHYSICAL EXAM: Filed Vitals:   09/08/10 1027  BP: 98/68  Pulse: 69  Resp: 14  General:  Well appearing. No resp difficulty HEENT: normal Neck: supple. JVP flat. Carotids 2+ bilaterally; no bruits. No lymphadenopathy or thryomegaly appreciated. Cor: PMI normal. Regular rate &  rhythm. No rubs, gallops or murmurs. Lungs: clear Abdomen: soft, nontender, nondistended. No hepatosplenomegaly. No bruits or masses. Good bowel sounds. Extremities: no cyanosis, clubbing, rash, edema Neuro: alert & orientedx3, cranial nerves grossly intact. Moves all 4 extremities w/o difficulty. Affect pleasant.    ECG: Atrial flutter with 4:1 block v-rate 70. LBBB (164 ms)   ASSESSMENT & PLAN:

## 2010-09-08 NOTE — Patient Instructions (Addendum)
Your physician recommends that you schedule a follow-up appointment in: 4 months with Arvilla Meres, MD Your physician has requested that you have a TEE/Cardioversion. During a TEE, sound waves are used to create images of your heart. It provides your doctor with information about the size and shape of your heart and how well your heart's chambers and valves are working. In this test, a transducer is attached to the end of a flexible tube that is guided down you throat and into your esophagus (the tube leading from your mouth to your stomach) to get a more detailed image of your heart. Once the TEE has determined that a blood clot is not present, the cardioversion begins. Electrical Cardioversion uses a jolt of electricity to your heart either through paddles or wired patches attached to your chest. This is a controlled, usually prescheduled, procedure. This procedure is done at the hospital and you are not awake during the procedure. You usually go home the day of the procedure. Please see the instruction sheet given to you today for more information.  SCHEDULED FOR 09/09/10 AT SHORT [facility]

## 2010-09-09 ENCOUNTER — Ambulatory Visit (HOSPITAL_COMMUNITY): Payer: Medicare Other

## 2010-09-09 ENCOUNTER — Ambulatory Visit (HOSPITAL_COMMUNITY)
Admission: RE | Admit: 2010-09-09 | Discharge: 2010-09-09 | Disposition: A | Discharge status: Home / Self Care | Payer: Medicare Other | Source: Ambulatory Visit | Attending: Internal Medicine | Admitting: Internal Medicine

## 2010-09-09 DIAGNOSIS — I428 Other cardiomyopathies: Secondary | ICD-10-CM | POA: Insufficient documentation

## 2010-09-09 DIAGNOSIS — I4892 Unspecified atrial flutter: Secondary | ICD-10-CM

## 2010-09-09 DIAGNOSIS — I447 Left bundle-branch block, unspecified: Secondary | ICD-10-CM | POA: Insufficient documentation

## 2010-09-09 DIAGNOSIS — I10 Essential (primary) hypertension: Secondary | ICD-10-CM | POA: Insufficient documentation

## 2010-09-09 DIAGNOSIS — Z86718 Personal history of other venous thrombosis and embolism: Secondary | ICD-10-CM | POA: Insufficient documentation

## 2010-09-09 LAB — APTT: aPTT: 33 seconds (ref 24–37)

## 2010-09-12 ENCOUNTER — Encounter: Payer: Self-pay | Admitting: Internal Medicine

## 2010-09-15 NOTE — Procedures (Signed)
  NAME:  Steven Macdonald, Steven Macdonald NO.:  [id number]  MEDICAL RECORD NO.:  [id number]          PATIENT TYPE:  LOCATION:                                 FACILITY:  PHYSICIAN:  Bevelyn Buckles. 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  He presented to the office yesterday and was found to be in atrial flutter.  We talked to him about the risks and indications of cardioversion and he agreed to proceed.  His INRs have all been therapeutic for long time.  The most recently INR was checked and was 2.6.  DESCRIPTION OF PROCEDURE:  After risks and indications were explained, consent was signed and placed on the chart.  The pads were placed in anterior and posterior position.  He then underwent sedation by Dr. Noreene Larsson of anesthesia with 70 mg of propofol. Once appropriately sedated, he received a single synchronized 120 joules biphasic shock.  Initially converted to a junctional rhythm with what appeared to be V-pacing and then he converted to sinus rhythm.  There are no apparent complications.  He will follow up with Dr. Graciela Husbands to discuss possible ablation.     Bevelyn Buckles. Bensimhon, MD     DRB/MEDQ  D:  09/09/2010  T:  09/09/2010  Job:  [id number]  cc:   Loraine Leriche A. Perini, M.D.  Electronically Signed by Arvilla Meres MD on 09/15/2010 06:46:42 PM

## 2010-09-18 ENCOUNTER — Encounter: Payer: Self-pay | Admitting: *Deleted

## 2010-09-27 ENCOUNTER — Other Ambulatory Visit: Payer: Self-pay | Admitting: Dermatology

## 2010-10-12 NOTE — Consult Note (Signed)
NAME:  RENNY, REMER NO.:  [id number]  MEDICAL RECORD NO.:  [id number]           PATIENT TYPE:  O  LOCATION:  [facility]                         FACILITY:  [facility]  PHYSICIAN:  Duke Salvia, MD, FACCDATE OF BIRTH:  [birth year]  DATE OF CONSULTATION:  09/09/2010 DATE OF DISCHARGE:  09/09/2010                                CONSULTATION   Thank you very much for asking [place] to see Mr. Sergi Gellner in consultation because of atrial flutter.  Mr. Kampf is a 76 year old gentleman with a known history of symptomatic sinus bradycardia status post pacemaker implantation with a history of nonischemic cardiomyopathy initially identified in 2008 with an echo demonstrating ejection fraction of 25%.  There was an improvement over the ensuing couple years so that in March 2010 it was 45% echo, now demonstrates ejection fraction of 20-25% with moderate mitral regurgitation occurring in the context of atrial flutter.  This atrial flutter was evident on routine followup which was scheduled yesterday. He underwent cardioversion for it today.  He has been quite amazingly fit.  A year ago he walked 30 days on the [place] trail with a 50 pound pack.  He has been working on his treadmill typically 4 miles an hour at 4% grade.  He has, however, noted some change in his exercise tolerance over the last couple of weeks. The atrial flutter was to him unbeknownst.  He denies syncope, presyncope peripheral edema, orthopnea, or nocturnal dyspnea.  He also has a history of pulmonary embolism for which he is on chronic Coumadin.  Thromboembolic risk factors for atrial flutter include hypertension, age, cardiomyopathy.  Outpatient medications include carvedilol 25, lisinopril 10, warfarin, and simvastatin.  He has no known drug allergies.  Last pacemaker check done remotely was in the fall demonstrating approximately 100% atrial pacing.  Apparently he also had 2 syncopal episodes and  VHR was reprogrammed at that time.  There have been no subsequent events apparently.  Past medical history in addition to above is notable for BPH status post TURP.  SOCIAL HISTORY:  He is a Advertising copywriter.  His father I took care of when he was 89.  His first wife had died of metastatic lung cancer.  He is separated and divorced from the second wife and apparently engaged to be married again.  He does not use cigarettes or recreational drugs.  He does drink alcohol once to twice per day.  His family history is noncontributory.  His review of systems is broadly negative apart from what is listed above.  PHYSICAL EXAMINATION:  GENERAL:  He is an elderly Caucasian male appearing his stated age but acting considerably younger than his stated age of 20. VITAL SIGNS:  His blood pressure was 120/80 with a pulse of 120 pre cardioversion.  Post cardioversion was 90/50 with a heart rate of 75. He recovered then to about 100 and or so over 60.  His respirations were 16 and unlabored. HEENT:  Normal. NECK:  Supple with carotids brisk bilaterally without bruits.  There is no adenopathy. HEART:  Sounds were regular without murmurs or gallops. LUNGS:  Clear.  ABDOMEN:  Soft with active bowel sounds. EXTREMITIES:  Without clubbing, cyanosis, or edema. NEUROLOGICAL:  Grossly normal.  Cranial nerves were grossly intact. There are no gait issues.  There are no obvious musculoskeletal abnormalities. SKIN:  Warm and dry.  Electrocardiogram dated yesterday demonstrated atrial flutter that was typical of 4 to 1.  His pacemaker was not interrogated.  Echo was as noted previously and laboratories were INR 2.67, a creatinine of 1.1, and a normal hemogram.  IMPRESSION: 1. Atrial flutter. 2. Thromboembolic risk factors notable for age, hypertension, and     cardiomyopathy with a CHADS VASc score of 3 and an annualized risk     of stroke estimated at about 3% and on the high side given his     older  age. 3. Sinus node dysfunction with 100% atrial pacing. 4. History of deep venous thrombosis and pulmonary emboli on chronic     Coumadin. 5. Nonischemic cardiomyopathy with ejection fraction of 25% with     moderate mitral regurgitation. 6. Functional class I-II. 7. Left bundle-branch block, QRS duration 162.  DISCUSSION:  Mr. Estis has atrial flutter in the setting of CHADS VASc score of 3-4.  This would be associated with annualized risk of about 3- 4%.  On therapy reduction, risk is estimated about 60-80% or so which would leave him with a residual 1% risk of stroke attributable to his atrial flutter.  Procedure risks are about 1 in 1000 and it is a reasonable thing to anticipate some diminution in risk by ablation of his atrial flutter.  There is some increased risk associated with the procedure given his history of DVT and pulmonary embolism and so we would like to minimize the number of sheath utilized for the procedure. We have discussed the potential risks and benefits as outlined above and he would like to pursue catheter ablation.  I am also bothered by his awareness of recent decline in exercise tolerance.  This may well be related to atrial flutter.  Unfortunately the device interrogation was not performed and I do not have the date for the onset of atrial flutter to see if it correlates.  There has been a diminution in left ventricular function which may also be attributed to the atrial flutter, but perhaps not and it may well be that will resynchronization of his broad left bundle would be helpful in terms of his symptoms and preventing the progression of symptoms.  For right now the plan would be then to proceed with catheter ablation. This will be done on Coumadin with minimal number of catheters.  We will also keep a close tab on his functional status and be actively pursuing resynchronization in the event that his exercise capacity does not return to  baseline.    Duke Salvia, MD, [facility]    SCK/MEDQ  D:  09/09/2010  T:  09/10/2010  Job:  [id number]  Electronically Signed by Sherryl Manges MD [facility] on 10/12/2010 08:47:16 AM

## 2010-10-13 ENCOUNTER — Telehealth: Payer: Self-pay | Admitting: Internal Medicine

## 2010-10-13 DIAGNOSIS — I4891 Unspecified atrial fibrillation: Secondary | ICD-10-CM

## 2010-10-13 NOTE — Telephone Encounter (Signed)
RN s/w Pt re: performing Echo prior to Dr Odessa Fleming appt in June. Pt reports speaking with Dr Graciela Husbands regarding his cardioversion possibly increasing his ejection fraction and would like to have the echo done prior to appt based on this conversation. RN advised Pt will forward to Dr Klein/Christine and his nurse will follow up with an answer. Pt verbalizes understanding.

## 2010-10-31 NOTE — Telephone Encounter (Signed)
Please arrange an echo af followup visit thx

## 2010-11-01 NOTE — Assessment & Plan Note (Signed)
[facility]                            CARDIOLOGY OFFICE NOTE   Steven Macdonald, Steven Macdonald                       MRN:          [id number]  DATE:09/16/2007                            DOB:          11-May-1935    The primary is Dr. Rodrigo Ran.   REASON FOR PRESENTATION:  Evaluate patient with cardiomyopathy.   HISTORY OF PRESENT ILLNESS:  The patient presents for follow-up of the  above.  He is 76 years old.  He at the last visit had carvedilol  increased from 3.125 twice a day to 6.25 twice a day.  He has had no  problems with this.  He had no lightheadedness, presyncope, or syncope.  No fatigue.  He says he feels better than he has in years.  He has been  quite active without any shortness of breath or chest discomfort.   PAST MEDICAL HISTORY:  1. Hypertension.  2. Symptomatic bradycardia with syncope (status post Medtronic      permanent dual-chamber pacemaker).  3. Reduced ejection fraction (45% in the past but most recently 25%.      Presumed nonischemic with negative stress perfusion studies).  4. Dyslipidemia.  5. Atrial arrhythmias.  6. Benign prostatic hypertrophy status post TURP.   ALLERGIES:  None.   MEDICATIONS:  1. Omega 3 fish oil.  2. Simvastatin 40 mg daily.  3. Lisinopril 5 mg b.i.d.  4. Aspirin 81 mg daily.  5. Carvedilol 6.25 mg b.i.d.   REVIEW OF SYSTEMS:  As stated in the HPI and otherwise negative for  other systems.   PHYSICAL EXAMINATION:  The patient is in no distress.  Blood pressure  114/80, heart rate 62 and regular, weight 185 pounds.  NECK:  No jugular distention at 45 degrees.  Carotid upstroke brisk and  symmetric.  No bruits, no thyromegaly.  LYMPHATICS:  No cervical, axillary, or inguinal adenopathy.  LUNGS:  Clear to auscultation bilaterally.  BACK:  No costovertebral angle tenderness.  CHEST:  Unremarkable.  HEART:  PMI not displaced or sustained, S1-S2 within normal limits.  No  S3-S4, no clicks, rubs,  murmurs.  ABDOMEN:  Flat, positive bowel sounds.  Normal in frequency and pitch,  no bruits, no rebound, no guarding, no midline pulsatile mass, no  organomegaly.  SKIN:  No rash, no nodules.  EXTREMITIES:  2+ pulse, no edema.   ASSESSMENT/PLAN:  1. Cardiomyopathy.  Today, I will increase his carvedilol to 9.375 mg      b.i.d.  He will remain on the current dose of ACE inhibitor.  2. Atrial arrhythmias.  Again, there is no indication that this is      contributing to his cardiomyopathy.  No further therapy is      warranted.  3. Follow-up.  See the patient back in about two months for the next      medication titration.     Rollene Rotunda, MD, [facility]  Electronically Signed    JH/MedQ  DD: 09/16/2007  DT: 09/17/2007  Job #: [id number]   cc:   Loraine Leriche A. Perini, M.D.

## 2010-11-01 NOTE — Assessment & Plan Note (Signed)
[facility]                            CARDIOLOGY OFFICE NOTE   DJON, TITH                       MRN:          [id number]  DATE:06/25/2007                            DOB:          30-Jan-1935    PRIMARY:  Dr. Rodrigo Ran.   REASON FOR PRESENTATION:  Evaluate patient with cardiomyopathy.   HISTORY OF PRESENT ILLNESS:  The patient is 76 years old.  He comes  today for titration of his medications.  However, he says was taking his  medications incorrectly; he was taking lisinopril twice a day and the  carvedilol once a day.  He just yesterday changed to the regimen as  listed below.  He is actually feeling fine.  He did not have any  problems when he was taking that regimen.  He has not been having any  presyncope or syncope.  He has not been having any chest pain.  He was  having no shortness of breath, PND or orthopnea.   Of note, his last pacemaker interrogation demonstrated that he does have  mode switching.  He has some atrial high-rate episodes.  I am not clear  what this arrhythmia is.  The rate is only 206 and it lasts for a very  short period.  The electrograms are not helpful, as they have not  recorded in the atrial arrhythmias.  The patient does not report any  palpitations, as mentioned above.   PAST MEDICAL HISTORY:  1. Hypertension.  2. Symptomatic bradycardia with syncope (status post Medtronic      permanent dual-chamber pacemaker).  3. Mildly reduced ejection fraction in the past (approximately 45% and      nonischemic, now 25%).  4. Dyslipidemia.  5. Benign prostatic hypertrophy, status post TURP.   ALLERGIES:  None.   MEDICATIONS:  1. Omega-3 fish oil.  2. Simvastatin 40 mg daily.  3. Lisinopril 5 mg daily.  4. Carvedilol 3.125 mg b.i.d.  5. Aspirin 162 mg weekly.   REVIEW OF SYSTEMS:  Review of systems is as stated in the HPI and  otherwise negative for other systems.   PHYSICAL EXAMINATION:  The patient is  in no distress.  Weight 191 pounds.  Blood pressure 110/73, heart rate 67 and regular.  HEENT:  Eyelids unremarkable.  Pupils are equal, round and reactive to  light.  Fundi not visualized.  Oral mucosa unremarkable.  NECK:  No jugular venous distention at 45 degrees.  Carotid upstroke  brisk and symmetric, no bruits.  No thyromegaly.  LYMPHATICS:  No cervical, axillary or inguinal adenopathy.  LUNGS:  Clear to auscultation bilaterally.  BACK:  No costovertebral angle tenderness.  CHEST:  Unremarkable.  HEART:  PMI not displaced or sustained.  S1 and S2 within normal limits.  No S3, no S4.  No clicks, no rubs, no murmurs.  ABDOMEN:  Flat.  Positive bowel sounds, normal in frequency and pitch.  No bruits, no rebound, no guarding, no midline pulsatile mass, no  organomegaly.  SKIN:  No rashes, no nodules.  EXTREMITIES:  Pulses 2+.  No edema.  NEUROLOGIC:  Oriented  to person, place and time.  Cranial nerves II-XII  grossly intact.  Motor grossly intact throughout.   ASSESSMENT AND PLAN:  1. Cardiomyopathy.  Today, I am going to increase the lisinopril to 5      mg b.i.d. and keep his carvedilol where it is.  I will slowly try      titration, though I do not think his blood pressure will allow me      to get to the targets that I would like.  After medication      titration, we will further evaluate his ejection fraction.  He has      class I symptoms at this point.  2. Atrial arrhythmias.  I am not sure of what these are.  We need to      make sure he is not having any atrial fibrillation because he would      need Coumadin.  I discussed this with Dr. Graciela Husbands and he will review      this and reevaluate his device to see if we can capture any of      these events.  I have discussed this with the patient.  3. Followup:  I would like to see the patient back in about 6 weeks or      sooner if needed.  I will continue medication titration, though I      am moving slightly slower because of  his blood pressure.     Rollene Rotunda, MD, [facility]  Electronically Signed    JH/MedQ  DD: 06/25/2007  DT: 06/26/2007  Job #: [id number]   cc:   Loraine Leriche A. Perini, M.D.

## 2010-11-01 NOTE — H&P (Signed)
NAME:  Steven Macdonald, Steven Macdonald NO.:  [id number]   MEDICAL RECORD NO.:  [id number]          PATIENT TYPE:  INP   LOCATION:  0109                         FACILITY:  [facility]   PHYSICIAN:  Barry Dienes. Eloise Harman, M.D.DATE OF BIRTH:  February 04, 1935   DATE OF ADMISSION:  02/19/2008  DATE OF DISCHARGE:                              HISTORY & PHYSICAL   CHIEF COMPLAINT:  Chest pain.   HISTORY OF PRESENT ILLNESS:  The patient is a 76 year old Caucasian man  who was seen by his cardiologist yesterday and was feeling fine.  Last  evening he had a fairly rapid onset of left-sided pleuritic chest pain  with intensity up to 5/10.  He also describes a very mild dyspnea on  exertion since that time, but denies fever or chills.  He has no  personal or family history of DVT or pulmonary embolism.  He has not had  recent prolonged bed rest, long trips by automobile or airplane, or  injury to his legs.  He has no history of malignancy.   PAST MEDICAL HISTORY:  1. Idiopathic cardiomyopathy with most recent left ventricular      ejection fraction 25%.  2. A 2006 Persantine Cardiolite test, showed no evidence of ischemia.  3 . Hypertension.  1. Pacemaker dependence for sick sinus syndrome.  2. Dyslipidemia.  3. Benign prostatic hypertrophy.  4. Colon polyps.  5. Lumbar spine degenerative disk disease seen on a 2004 MRI scan.   MEDICATIONS PRIOR TO ADMISSION:  1. Lisinopril 7.5 mg p.o. b.i.d. (just increased yesterday).  2. Coreg 12.5 mg p.o. b.i.d.  3. Simvastatin 20 mg p.o. daily.  4. Flax seed oil daily.  5. Aspirin 81 mg p.o. approximately once weekly.  6. Metamucil p.r.n.   ALLERGIES:  NO KNOWN DRUG ALLERGIES   PAST SURGICAL HISTORY:  1. A 2004 colonoscopy.  2. February 2006 cardiac pacemaker placement (dual chamber).  3. July 2008 TURP.   SOCIAL HISTORY:  He is a widow and has four children.  He smoked a pipe  until 1974.  Drinks a small amount of alcohol.  He is a Public relations account executive trial  attorney.   FAMILY HISTORY:  His father died at age 102.  His mother died at age 53  of breast cancer.  There is no family history of diabetes mellitus,  colon cancer, or premature coronary artery disease.   REVIEW OF SYSTEMS:  He exercises regularly without significant  difficulty.  He has had mild dyspnea on exertion since last night and a  dry cough since last night.  He denies substernal chest pain or  palpitations, nausea, vomiting, diarrhea, constipation, rectal bleeding,  low back pain, or current symptoms of benign prostatic hypertrophy.   INITIAL PHYSICAL EXAM:  VITAL SIGNS:  Blood pressure 134/84, pulse 60,  respirations 23, temperature 97.7, pulse oxygen saturation 99% on room  air.  GENERAL: He is a well-nourished, well-developed white male who had  frequent dry cough but no shortness of breath.  HEENT:  Exam was within normal limits.  NECK:  Without without jugular venous distention or carotid bruit.  CHEST:  Clear, although he had decreased lung excursion secondary to  pleuritic chest pain.  HEART:  Regular rate and rhythm without significant murmur or gallop.  ABDOMEN:  Normal bowel sounds and no hepatosplenomegaly or tenderness.  EXTREMITIES:  Without cyanosis, clubbing, or edema and there were no  palpable venous cords.   LABORATORY STUDIES:  White blood cell count 8.1, hemoglobin 13,  hematocrit 39.8, platelets 158, serum sodium 139, potassium 4.0,  chloride 105, carbon dioxide 30, BUN 17, creatinine 1.2, glucose 131,  calcium 9.7, CK-MB 1.1, troponin-I less than 0.05 and less than 0.05.  A  chest x-ray showed bibasilar atelectasis.  A CT angiogram study of the  chest showed bilateral pulmonary emboli with no evidence of mediastinal  lymphadenopathy and bibasilar atelectasis stool test in the emergency  room was hemoccult negative.  A fibrin derivatives test was 3.70.  EKG  showed 100% paced beats in a left bundle branch block pattern.   IMPRESSION AND PLAN:  1.  Pulmonary embolism.  He is stable on heparin IV which will be      continued.  The etiology of his pulmonary emboli is uncertain and      could be from his dilated cardiomyopathy.  I doubt that he has an      underlying malignancy or an inherited hypercoagulable state.  There      is no clinical evidence of a DVT, and detection of the DVT does not      alter his management.  I plan to continue heparin IV with a      transition to Coumadin.  We will add Vicodin and Tylenol as needed      for moderate or mild pain.  2. Dilated cardiomyopathy.  Stable with no jugular venous distention      to suggest right-sided heart failure.  His cough could be due to      the pulmonary emboli versus the recently increased ACE inhibitor      dose.           ______________________________  Barry Dienes. Eloise Harman, M.D.     DGP/MEDQ  D:  02/19/2008  T:  02/20/2008  Job:  [id number]   cc:   Loraine Leriche A. Perini, M.D.  Fax: [phone]   Rollene Rotunda, MD, [facility]  [address]  [address]  [place]  [place] [address]   Valetta Fuller, M.D.  Fax: [phone]

## 2010-11-01 NOTE — Op Note (Signed)
NAME:  Steven, Macdonald NO.:  [id number]   MEDICAL RECORD NO.:  [id number]          PATIENT TYPE:  OIB   LOCATION:  1437                         FACILITY:  [facility]   PHYSICIAN:  Valetta Fuller, M.D.  DATE OF BIRTH:  1934/07/10   DATE OF PROCEDURE:  12/19/2006  DATE OF DISCHARGE:  12/20/2006                               OPERATIVE REPORT   PREOPERATIVE DIAGNOSIS:  1. Benign prostatic hypertrophy and bladder neck obstruction.  2. Urinary retention.   POSTOPERATIVE DIAGNOSIS:  1. Benign prostatic hypertrophy and bladder neck obstruction.  2. Urinary retention.   PROCEDURE PERFORMED:  Transurethral resection of the prostate.   SURGEON:  Valetta Fuller, M.D.   ANESTHESIA:  Spinal.   INDICATIONS:  Steven Macdonald is a 76 year old male.  He has had some previous  long standing bladder neck obstruction and has developed some  intermittent episodes of urinary retention.  The patient had been  managed on alpha blockers and doing pretty well but recently developed  recurrent urinary retention.  He has failed several voiding trials  including double dose Flomax.  There has been no obvious etiology for  his retention in that there has been no evidence of prostatitis, no new  medication etc.  We talked about several options and he had elected to  have TURP.  PSA data on him has been within normal limits and there has  been no obvious evidence or concern about prostate cancer.  The patient  appeared to understand the advantages and disadvantages, potential  complications of TURP including the potential for sexual dysfunction,  retrograde ejaculation, urinary incontinence as well as bleeding issues.  The patient now presents for the procedure.   TECHNIQUE AND FINDINGS:  The patient was brought to the operating room  where he had successful induction of a spinal anesthetic.  He received  perioperative antibiotics.  He was placed in the lithotomy position,  prepped and draped in the  usual manner.  The patient underwent dilation  of his urethra and bladder neck with Sissy Hoff sounds to 32 [place].  A  28 French continuous flow resectoscope sheath was then placed.  The  bladder was carefully inspected.  Other than some mild trabeculation,  there was no other pathology.  The patient did have visual obstruction  from trilobar hyperplasia with a fairly high riding median bar but no  real middle lobe component.  The ureteral orifices were easily  identified.  Glycine was used as a bladder irrigant.  Continuous flow  resectoscope was utilized.  The TURP was initiated taking down the  median bar and bladder neck fibers.  Once this had been accomplished,  attention was turned to the right lateral lobe of the prostate.  Starting anteriorly and working posteriorly, we resected prostate tissue  down close to the prostate capsule and out to the apex.  The  verumontanum was preserved as well as some extreme apical tissue.  I did  not elect to go all the way down to the capsule because I felt that  would increase the risk of erectile dysfunction and we merely  resected  most of the visual tissue.  The contralateral left lobe was done in the  same manner.  At the completion of the procedure, about 20 grams of  tissue had been resected.  Hemostasis remained quite good.  Visually,  there was nice resolution of the visual obstruction.  The bladder neck  was quite flat with the prostatic urethra.  Ureteral orifices, again,  were uninjured.  At the completion of the procedure, a 24 [place] three  way Foley catheter was inserted and continuous bladder irrigation  started with normal saline.  The patient appeared to tolerate the  procedure well.  There were no obvious complications, obvious problems,  or mental status changes.  He was brought to the recovery room in stable  condition with relatively clear urine.           ______________________________  Valetta Fuller, M.D.   Electronically Signed     DSG/MEDQ  D:  12/20/2006  T:  12/21/2006  Job:  [id number]

## 2010-11-01 NOTE — Assessment & Plan Note (Signed)
[facility]                            CARDIOLOGY OFFICE NOTE   BOYCE, KELTNER                       MRN:          [id number]  DATE:04/01/2008                            DOB:          [birth year]    PRIMARY CARE PHYSICIAN:  Loraine Leriche A. Perini, MD   REASON FOR CONSULTATION:  Second opinion regarding cardiomyopathy.   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 Graceann Congress.  He  has been followed by Dr. Antoine Poche and Dr. Graciela Husbands in our practice for  history of cardiomyopathy which has been presumed to be nonischemic as  well as atrial dysrhythmias and syncope for which he has had a pacemaker  implanted.  He was asked to see me for a second opinion.   Looking over his history, it appears that he was diagnosed with a mild  cardiomyopathy back in 2002.  Nuclear study showed an ejection fraction  of 44%.  This appeared to be nonischemic.  He was treated medically,  although there was some issues with compliance.  He subsequently  socially developed problems with syncope and bradycardia in the setting  of left bundle-branch block and underwent pacemaker implantation in  2006.  In November 2008, he underwent routine echocardiogram which  showed an ejection fraction of approximately 25%.  I have reviewed this  and agree with the interpretation.  In the interim, he has been seen by  Dr. Antoine Poche as well as Dr. Orma Render in [place].  Both have suggested  a repeat echocardiogram and followup nuclear study or cardiac  catheterization.   Most recently last month, he developed some chest pain.  He was found to  have a DVT and large bilateral pulmonary emboli.  He has been started on  Coumadin.  Hypercoagulable workup has not been performed.  There is no  obvious inciting reasons for his clot.   From a functional point of view, he is doing to phenomenally.  He goes  to the club twice  a week and works out with a Psychologist, educational for an hour and  half.  He does 30 minutes on the treadmill at 4.8 miles an hour and 3%  grade.  He has absolutely no chest pain or shortness of breath.  He also  does an extensive weight in calisthenic routine.  He has not any  problems with orthopnea, PND, chest pain, or lower extremity edema.  He  also has a very active sex life.  He has some concern over the risk of  sudden cardiac death during sexual activity.  He does use Viagra 100 mg  p.r.n.  However, he has also gotten into the habit of skipping 1 or 2  doses of beta-blockers prior to sexual activity.  He is wondering if  this is acceptable.   Review of systems is negative except for HPI and problem list.   PROBLEM LIST:  1. Presumed nonischemic cardiomyopathy, ejection fraction previously      44% and now 25% by echo in November 2008.  2. History of symptomatic bradycardia and left bundle-branch block      associated with syncopes.      a.     Status post pacemaker placement in 2006.  3. Recent deep vein thrombosis and bilateral pulmonary emboli, started      on Coumadin in September 2009.   CURRENT MEDICATIONS:  1. Metamucil.  2. Simvastatin 20 b.i.d.  3. Flaxseed oil.  4. Lisinopril 7.5 b.i.d.  5. Coumadin.  6. Coreg 18.75 b.i.d.  7. Viagra 100 mg p.r.n.   ALLERGIES:  No known drug allergies.   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  He is  widowed.  His first wife died from metastatic lung cancer in 2000.  He  has been recently separated and divorced from his second wife.  He used  to smoke a pipe, quit in 1972.  No cigarette history, social alcohol, 1-  2 glasses of wine three to four times a week.   FAMILY HISTORY:  Father died at 29 years old, developed arrhythmias in  his 89s.  Mother died at 61 from metastatic breast cancer.  He has no  siblings.   PHYSICAL EXAMINATION:  GENERAL:  He is well-appearing in no acute  distress.  Ambulates around the  clinic without any respiratory  difficulty.  VITAL SIGNS:  Blood pressure is 120/78, heart rate 70, weight is 189  which is stable.  HEENT:  Normal.  NECK:  Supple.  There is no JVD.  Carotids are 2+ bilaterally without  any bruits.  There is no lymphadenopathy or thyromegaly.  CARDIAC:  PMI is not significantly displaced.  He has a regular rate and  rhythm.  There is no murmurs, rubs, or gallops.  There is no S3.  LUNGS:  Clear.  ABDOMEN:  Soft, nontender, nondistended.  There is no  hepatosplenomegaly, no bruits, no masses appreciated.  EXTREMITIES:  Warm with no cyanosis, clubbing, or edema.  There is good  pulses.  No rash.  NEUROLOGIC:  Alert and oriented x3.  Cranial nerves II through XII are  intact.  Moves all 4 extremities without difficulty.  Affect is very  pleasant.   EKG shows atrial pacing with a left bundle-branch block at a rate of 68.  This is from March 19, 2008.   Interrogation of pacemaker which I did not have in front of me  apparently showed no significant atrial arrhythmias.   ASSESSMENT AND PLAN:  Cardiomyopathy based on his echo in November 2008,  Mr. Pulis has significant left ventricular dysfunction which is  completely asymptomatic.  However, he has had a progressive decline in  his ejection fraction.  At this point, I have suggested that we proceed  with reevaluation of his ejection fraction and also have suggested that  cardiac catheterization would be helpful to rule out underlying ischemic  heart disease.  However, this is complicated by his recent pulmonary  embolus and need for anticoagulation.  At this point, I am not  comfortable stopping his Coumadin as he is only several weeks out from  his embolus.  We have discussed the issue in depth, and I think the best  plan would be to do a cardiac CT to quantify his ejection fraction and  get a sense of his coronary anatomy.  We will schedule this for Friday.  I have discussed it with Dr. Sherlie Ban.    From medication point of view, we will increase his Coreg to 25 b.i.d.  and hopefully  will be able to increase his lisinopril in the future.  I  suggested to him that he not skip doses prior to sexual activity.  Perhaps, he can find a way just to reduce the dose slightly, but not  skip it completely.   Finally, should his ejection fraction be less than 35%.  We will need to  consider re-evaluation by EP for a prophylactic ICD.   DISPOSITION:  Pending the results of his cardiac CT, we will see him  back in 2 months for followup.   TOTAL TIME FOR CONSULT:  Approximately a hour and 10 minutes.     Bevelyn Buckles. Bensimhon, MD  Electronically Signed    DRB/MedQ  DD: 04/01/2008  DT: 04/02/2008  Job #: [id number]

## 2010-11-01 NOTE — Assessment & Plan Note (Signed)
[facility]                            CARDIOLOGY OFFICE NOTE   Steven Macdonald, Steven Macdonald                       MRN:          [id number]  DATE:06/22/2008                            DOB:          February 09, 1935    PRIMARY CARE PHYSICIAN:  Loraine Leriche A. 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   I first saw him in October for a second opinion regarding his  cardiomyopathy.  Since that time, he underwent cardiac CT.  This showed  global LV dysfunction with an ejection fraction of 31%.  The calcium  score was 0.  However, there was some motion artifact, which raised the  possibility of a proximal LAD lesion.  I reviewed this with Dr. Shirlee Latch  and we both feel that this is artifact and not true coronary disease.   From a functional standpoint, he is doing great.  He continues to  exercise at the gym 2-3 times a week on the treadmill at 4.8 miles an  hour at 3% grade without any chest pain or shortness of breath.  He also  walks his dog 30 minutes each day without any problems.  He denies any  orthopnea, no PND, no lower extremity edema, no chest pain.  He is  tolerating his medications well.  He has not had any bleeding with his  Coumadin.   CURRENT MEDICATIONS:  1. Metamucil.  2. Simvastatin 20 b.i.d.  3. Flaxseed oil 1 g a day.  4. Lisinopril 7.5 b.i.d.  5. Coumadin.  6. Coreg 25 b.i.d.   PHYSICAL EXAMINATION:  GENERAL:  He is well appearing in no acute  distress, ambulates around the clinic without any respiratory  difficulty.  VITAL SIGNS:  Blood pressure is 116/68 with a heart rate of 68, his  weight is 190, which is stable.  HEENT:  Normal.  NECK:  Supple.  There is no JVD.   Carotids are 2+ bilaterally without  any bruits.  There is no lymphadenopathy or thyromegaly.  CARDIAC:  PMI is nondisplaced.  He has regular rate and rhythm.  There  is no murmur, rub, or gallop.  His S2 is widely split consistent with  his left bundle-branch block.  LUNGS:  Clear.  ABDOMEN:  Soft, nontender, and nondistended.  There is no  hepatosplenomegaly.  No bruits, no masses.  Good bowel sounds.  EXTREMITIES:  Warm with no cyanosis, clubbing, or edema.  NEUROLOGIC:  Alert and oriented x3.  Cranial nerves II through XII are  grossly intact.  Moves all 4 extremities without difficulty.  Affect is  pleasant.   EKG shows atrial pacing with a left bundle-branch block at a rate of 68.   Cholesterol is 121, triglycerides 117, HDL 35, LDL of 63.   ASSESSMENT AND PLAN:  1. Congestive heart failure secondary to nonischemic cardiomyopathy,  recent ejection fraction of 31%.  At this point, he is NYHA class      I.  His volume status looks good.  Given the fact that his      functional status is so well preserved, he does not meet strict      criteria for a defibrillator placement.  However, given the recent      made CRT data, he may have some benefit with reverse for modeling      and possible mortality benefit with a biventricular pacer.      Unfortunately, this indication has not been approved yet.  Thus at      this point I think the best thing to do is continue to titrate his      heart failure regimen.  We will increase his lisinopril to 10 mg      b.i.d.  I will see him back in a couple of months.  At that point,      I will recheck an echocardiogram as his ejection fraction is still      below 35%.  We will need to consider carefully whether or not he      should be a candidate for a biventricular ICD.  2. Hyperlipidemia.  His LDL looks very good.  His HDL remains low.  I      asked him to switch from flaxseed oil over to fish oil, which has      shown a benefit in raising  HDL as well as improving mortality in      heart failure patients.   DISPOSITION:  I will see him back in a couple of months for followup.     Bevelyn Buckles. Bensimhon, MD  Electronically Signed    DRB/MedQ  DD: 06/22/2008  DT: 06/23/2008  Job #: [id number]   cc:   Loraine Leriche A. Perini, M.D.

## 2010-11-01 NOTE — Assessment & Plan Note (Signed)
[facility]                            CARDIOLOGY OFFICE NOTE   [name], [name]                       MRN:          [id number]  DATE:02/18/2008                            DOB:          31-Oct-1934    PRIMARY CARE PHYSICIAN:  Loraine Leriche A. Perini, M.D.   REASON FOR PRESENTATION:  The patient with cardiomyopathy.   HISTORY OF PRESENT ILLNESS:  The patient returns for followup.  His last  ejection fraction was measured to be 25%.  This had been reduced from a  previous 45%.  I discussed with him an ischemia workup.  We had began  med titration with addition of lisinopril.  He wanted to defer any  further workup until he had an appointment in [place], where he had his  pacemaker.  He saw Dr. Reola Mosher there.  He concurred with medical regimen  plans for uptitration.  He also concurred with need for an ischemia  workup.   [name] now returns for followup.  He has had no new symptoms and  still remains quite active, working out of the [employer].  With his  vigorous activity, he denies any chest discomfort, neck or arm  discomfort.  He has had no palpitations, presyncope, or syncope.  He has  had no PND or orthopnea.   PAST MEDICAL HISTORY:  1. Hypertension, symptomatic bradycardia with syncope (status post      [employer] permanent dual chamber pacemaker).  2. Reduced ejection fraction 45% in the past and most recently 25%      presumed nonischemic with a negative stress perfusion study in the      past.  3. Dyslipidemia.  4. Atrial arrhythmias.  5. Benign prostatic hypertrophy status post TURP.   ALLERGIES:  None.   CURRENT MEDICATIONS:  1. Aspirin 81 mg daily.  2. Metamucil fiber.  3. Lisinopril 5 mg b.i.d.  4. Simvastatin 20 mg daily.  5. Carvedilol 12.5 mg b.i.d.  6. Flax seed oil.   REVIEW OF SYSTEMS:  As stated in the HPI and otherwise negative for all  other systems.   PHYSICAL EXAMINATION:  GENERAL:      The patient is in no acute  distress.  VITALS:  Blood pressure 108/78, heart rate 68 and regular, and body mass  index 23, and weight 186 pounds.  HEENT:  Eyelids unremarkable; pupils are equal, round, and react to  light; fundi not visualized; oral mucosa unremarkable.  NECK:  No jugular venous distention at 45 degrees.  Carotid upstroke  brisk and symmetric.  No bruits, no thyromegaly.  LYMPHATICS:  No cervical, axillary, or inguinal lymphadenopathy.  LUNGS:  Clear to auscultation bilaterally.  CHEST:  Well-healed pacemaker pocket.  HEART:  PMI is not displaced or sustained, S1 and S2 within normal  limits, no S3, no S4.  No clicks, no rubs, and no murmurs.  ABDOMEN:  Flat, positive bowel sounds, normal in frequency and pitch, no  bruits, no rebound or guarding.  No midline pulsatile mass, no  hepatomegaly, no splenomegaly.  SKIN:  No rashes, and no nodules.  EXTREMITIES:  2+  pulses throughout, no edema, no cyanosis, or clubbing.  NEURO:  Oriented to person, place, and time.  Cranial nerves II-XII  grossly intact, motor grossly intact.   ASSESSMENT AND PLAN:  1. Cardiomyopathy.  The patient agrees with continued med titration.      I have discussed with him an ischemia workup.  He wants to await      until we have titrated meds at which point he will probably consent      to a perfusion study.  Towards that end, I am going to increase his      lisinopril to 7.5 mg b.i.d.  The goal there will be at least 10 mg      twice a day or 20 mg once a day and carvedilol 25 mg twice a day.      For now, there is no indication for an ICD as he has non-ischemic      etiology and class I symptoms.  2. Bradycardia.  He is followed in our Pacemaker Clinic.  3. Followup.  I will see the patient again in about 1 month for the      next med titration.     Rollene Rotunda, MD, [facility]  Electronically Signed    JH/MedQ  DD: 02/18/2008  DT: 02/19/2008  Job #: [id number]   cc:   Loraine Leriche A. Perini, M.D.

## 2010-11-01 NOTE — Discharge Summary (Signed)
NAME:  Steven Macdonald, Steven Macdonald                ACCOUNT NO.:  [id number]   MEDICAL RECORD NO.:  [id number]          PATIENT TYPE:  INP   LOCATION:  1420                         FACILITY:  [facility]   PHYSICIAN:  Barry Dienes. Eloise Harman, M.D.DATE OF BIRTH:  06/27/1934   DATE OF ADMISSION:  02/19/2008  DATE OF DISCHARGE:  02/24/2008                               DISCHARGE SUMMARY   PERTINENT FINDINGS:  The patient is a 76 year old Caucasian man who, on  the evening prior to admission, had a fairly rapid onset of left-sided  pleuritic chest pain with intensity up to 5/10 and mild dyspnea on  exertion.  He denied fever or chills.  He had no personal history of DVT  or pulmonary embolism, and no family history of such.  He had not had  recent prolonged bedrest, long trips by automobile or airplane, or  injury to his legs.  He has no history of malignancy.   PAST MEDICAL HISTORY:  Most significant for idiopathic cardiomyopathy  with left ventricular ejection fraction 25%, pacemaker dependence due to  sick sinus syndrome, dyslipidemia, benign prostatic hypertrophy, colon  polyps, and lumbar spine degenerative disk disease.   MEDICATIONS PRIOR TO ADMISSION:  1. Lisinopril 7.5 mg p.o. b.i.d.  2. Coreg 12.5 mg p.o. b.i.d.  3. Simvastatin 20 mg p.o. daily.  4. Flaxseed oil daily.  5. Aspirin 81 mg once weekly.  6. Metamucil as needed.   ALLERGIES:  No known drug allergies.   INITIAL PHYSICAL EXAM:  VITAL SIGNS:  Blood pressure 134/84, pulse 60,  respirations 20-30, temperature 97.7, pulse oxygen saturation 99% on  room air.  GENERAL:  He is a well-nourished, well-developed white male who had  frequent dry cough but no shortness of breath.  HEAD, EYES, EARS, NOSE AND THROAT:  Exam was within normal limits.  NECK:  Without jugular venous distention or carotid bruit.  CHEST:  Clear to auscultation with decreased lung excursion due to  pleuritic chest pain.  HEART:  Had a regular rate and rhythm without  significant murmur or  gallop.  ABDOMEN:  Normal bowel sounds and no hepatosplenomegaly or tenderness.  EXTREMITIES:  Without cyanosis, clubbing, or edema.   INITIAL LABORATORY STUDIES:  White blood cell count 8.1, hemoglobin 13,  hematocrit 39.8, platelets 158,000.  Serum sodium 139, potassium 4,  chloride 105, carbon dioxide 30, BUN 17, creatinine 1.2, glucose 131,  troponin-I was less than 0.05.  Chest x-ray showed bibasilar  atelectasis.  A CT angiogram of the chest showed bilateral pulmonary  emboli with no evidence of mediastinal lymphadenopathy.  There was also  bibasilar atelectasis.  A stool test in the emergency room was Hemoccult  negative.  A fibrin derivatives test was 3.7.  EKG showed 100% paced  beats with a left bundle branch block pattern.   HOSPITAL COURSE:  The patient was started on IV heparin and Vicodin as  needed for breakthrough pain.  On September 3 he had a left lower a  bilateral lower extremity DVT ultrasound exam that was normal on the  right, but on the left show thrombus in the popliteal vein.  Gradually  his pleuritic chest pain resolved.  He had no unusual bleeding.  His  anticoagulation was co-managed by the pharmacist Ph.D.  He had numerous  hypercoagulable laboratory tests including antithrombin III (81), Factor  V mutation testing which was negative, protein S which was 56 (69 -  129).  His functional protein C was 100%, anti phospholipid antibodies  which were normal.   PROCEDURES:  CT angiogram of the chest and bilateral lower extremity DVT  ultrasound exam.   CONDITION ON DISCHARGE:  He feels fine and no longer has any pleuritic  chest pain.  He denies shortness of breath, nausea, headache, or unusual  bleeding.  His been eating well and ambulating in his room without  difficulty.   MOST RECENT PHYSICAL EXAM:  VITAL SIGNS:  Blood pressure 123/78, pulse  63, respirations 20, temperature 97.4, pulse oxygen saturation 95% on  room air.   GENERAL:  He is a well-nourished, well-developed white male who is in no  apparent distress.  NECK:  He had no jugular venous distention.  CHEST:  Clear to auscultation.  HEART:  Regular rate and rhythm without significant murmur or gallop.  ABDOMEN:  Benign.  EXTREMITIES:  Trace left lower extremity ankle edema.   MOST RECENT LABORATORY TESTS:  Included serum sodium 141, potassium 4,  chloride 109, carbon dioxide 29, BUN 13, creatinine 1.04, glucose 97,  white blood cell count 5.7, hemoglobin 12, hematocrit 37, platelets  148,000, PT 18.6, INR 1.5.   DISCHARGE DIAGNOSES:  1. Bilateral pulmonary emboli.  2. Left lower extremity deep vein thrombosis.  3. Idiopathic cardiomyopathy with left ventricular ejection fraction      25%.  4. Pacemaker dependence for sick sinus syndrome.  5. Dyslipidemia.  6. Benign prostatic hypertrophy.  7. Colon polyps.  8. Lumbar spine degenerative disk disease.   DISCHARGE MEDICATIONS:  1. Lisinopril 7.5 mg p.o. twice daily.  2. Coreg 12.5 mg p.o. twice daily.  3. Simvastatin 20 mg p.o. daily.  4. Flaxseed oil daily.  5. Metamucil as needed.  6. Coumadin 5 mg tablets take 2 tablets daily (note that he has been      taking 10 mg daily since September 5).  7. Lovenox 80 mg subcutaneously every 12 hours for 6 days (via the      hospital pharmacy).   He was also advised to stop taking aspirin for now.   DISPOSITION/FOLLOWUP:  He will be discharged to home.  He was advised to  stop by our lab tomorrow for a recheck of his INR test.  He should first  call my nurse, D.J.  He should have a followup visit with Dr. Waynard Edwards in  1 to 2 weeks.  In addition, he should schedule a followup visit with Dr.  Antoine Poche within approximately 4 weeks.  He should call our office as  soon as possible for any unusual bleeding.           ______________________________  Barry Dienes. Eloise Harman, M.D.     DGP/MEDQ  D:  02/24/2008  T:  02/24/2008  Job:  [id number]   cc:   Loraine Leriche  A. Perini, M.D.  Fax: [phone]   Rollene Rotunda, MD, [facility]  [address]  [address]  [place]  [place] [address]   Valetta Fuller, M.D.  Fax: [phone]

## 2010-11-01 NOTE — Assessment & Plan Note (Signed)
[facility]                            CARDIOLOGY OFFICE NOTE   Steven Macdonald, Steven Macdonald                       MRN:          [id number]  DATE:03/19/2008                            DOB:          01-Apr-1935    PRIMARY CARE PHYSICIAN:  Loraine Leriche A. Perini, MD   REASON FOR PRESENTATION:  Evaluate the patient with cardiomyopathy.   HISTORY OF PRESENT ILLNESS:  The patient returns for followup of the  above.  Interestingly, the evening after I saw him last time he  developed some chest discomfort.  He presented for Dr. Eloise Harman to  evaluate in the hospital and was actually found to have large pulmonary  emboli with apparent deep venous thrombosis.  He is now on Coumadin.  He  said that in retrospect he was a little dyspneic when he saw me to  mention this.  He has never had any leg tenderness or swelling.  The  etiology of the reason behind his DVT was not entirely clear.   He continues to deny any new cardiac symptoms.  He has not had any  palpitation, presyncope, or syncope.  She has had no PND or orthopnea.  At the last meeting, I went to up titrate his lisinopril to 7.5 mg which  would have been 1-1/2 tablets twice a day.  However, he did this with  his carvedilol increasing to 18.75 mg twice a day.  He did well with  that increase.   PAST MEDICAL HISTORY:  Hypertension, symptomatic bradycardia with  syncope (status post Medtronic permanent dual-chamber pacemaker),  reduced ejection fraction (45% in the past, most recently 25% presumed  to be nonischemic with a negative stress perfusion study in the past),  dyslipidemia, atrial arrhythmias, benign prostatic hypertrophy status  post TURP, DVT and PE recently diagnosed.   ALLERGIES:  None.   MEDICATIONS:  1. Aspirin 81 mg daily.  2. Metamucil.  3. Lisinopril 5 mg b.i.d.  4. Simvastatin 20 mg b.i.d.  5. Carvedilol 18.75 mg b.i.d.  6. Coumadin.  7. Flaxseed oil.   REVIEW OF SYSTEMS:  As stated in the  HPI and otherwise negative for  other systems.   PHYSICAL EXAMINATION:  GENERAL:  The patient is in no distress.  VITAL SIGNS:  Blood pressure 113/63, heart rate 68 and regular, and  weight 188 pounds.  NECK:  No jugular venous distention at 45 degrees.  Carotid upstroke  brisk and symmetrical.  No bruits, no thyromegaly.  LYMPHATICS:  No cervical, axillary, or inguinal adenopathy.  LUNGS:  Clear to auscultation bilaterally.  BACK:  No costovertebral angle tenderness.  CHEST:  Well-healed pacemaker pocket.  HEART:  PMI not displaced or sustained, S1-S2 within normal limits, no  S3, no S4, no clicks, no rubs, or no murmurs.  ABDOMEN:  Flat, positive bowel sounds normal in frequency and pitch, no  bruits, no rebound, no guarding or midline pulsatile mass.  No  hepatomegaly.  No splenomegaly.  SKIN:  No rashes, no nodules.  EXTREMITIES:  Pulses 2+, no edema, no calf tenderness or swelling.  NEURO:  Grossly intact.  EKG:  Atrial paced rhythm with a left bundle-branch block.   ASSESSMENT AND PLAN:  1. Cardiomyopathy.  At this point, I am going to titrate his      lisinopril to 7.5 mg twice a day.  It would probably take me      another 2 visits and I hope to get to 10 mg twice a day of      lisinopril and 25 mg twice a day of carvedilol.  The patient would      then like to have a followup echocardiogram to see if there is an      improvement in his ejection fraction.  This was not unreasonable.      He does not want to have a stress perfusion study for further      ischemic evaluation, though we have discussed this.  He might      consent if his ejection fraction is not improved with med      titration.  Currently, he has class I symptoms.  He has pulmonary      emboli per Dr. Waynard Edwards.  He will remain on Coumadin.  2. Hypertension.  Blood pressure is managed in the context of treating      his cardiomyopathy.  3. Bradyarrhythmia.  The patient has a follow up of his pacemaker in       our clinic and he is up-to-date with this.  4. Followup.  I will see him back in about a month to see if we can      titrate meds further.     Rollene Rotunda, MD, [facility]  Electronically Signed    JH/MedQ  DD: 03/19/2008  DT: 03/20/2008  Job #: [id number]   cc:   Loraine Leriche A. Perini, M.D.

## 2010-11-01 NOTE — Assessment & Plan Note (Signed)
[facility]                            CARDIOLOGY OFFICE NOTE   Steven Macdonald, Steven Macdonald                       MRN:          [id number]  DATE:08/08/2007                            DOB:          [birth year]    PRIMARY:  Dr. Rodrigo Ran.   REASON FOR PRESENTATION:  The patient with cardiomyopathy.   HISTORY OF PRESENT ILLNESS:  The patient 76 year old white gentleman  comes in for med titration.  He has been doing very well, says he feels  better than he has a long time.  He has been exercising.  He denies any  shortness of breath.  He denies any PND or orthopnea.  He has not been  having any chest discomfort, neck or arm discomfort.  He has been having  no palpitation, presyncope or syncope.   PAST MEDICAL HISTORY:  Hypertension, symptomatic bradycardia with  syncope (status post Medtronic permanent dual-chamber pacemaker),  reduced ejection fraction (he had been 45% in the past and most recently  was 25%), dyslipidemia, atrial arrhythmias, benign prostatic hypertrophy  status post TURP.   ALLERGIES:  None.   MEDICATIONS:  Omega 3 fish oil, simvastatin 40 mg daily, carvedilol  3.125 mg b.i.d., lisinopril 5 mg b.i.d. and aspirin 81 mg daily.   REVIEW OF SYSTEMS:  As stated in the HPI, otherwise negative for other  systems.   PHYSICAL EXAMINATION:  The patient is in no distress.  Blood pressure  123/79, heart rate 79 regular, weight 186 pounds, body mass 24.  HEENT:  Eyelids unremarkable, pupils equal, round, and reactive to  light, fundi not visualized.  NECK:  No jugular venous distention at 45 degrees, carotid upstroke  brisk and symmetrical.  No bruits.  LUNGS:  Clear to auscultation bilaterally.  HEART:  PMI not displaced or sustained, S1-S2 within normal limits, no  S3, no murmurs, no S3, no S4, no murmurs.  ABDOMEN:  Flat, positive bowel sounds normal in frequency and pitch, no  bruits, rebound, guarding, no midline pulsatile mass,  organomegaly.  EXTREMITIES:  2+ pulse throughout, no edema.   ASSESSMENT/PLAN:  1. Cardiomyopathy.  Increase his carvedilol to 6.25 mg b.i.d..  Target      will be 25 mg b.i.d..  He will continue on current dose of ACE      inhibitor.  2. Atrial arrhythmias.  We turned on the electrograms to try to      determine whether this was atrial fibrillation or another rhythm.      I only have 2 small strips that are retrievable currently and there      is a P-wave before every ventricular beat on these strips      indicating from this preliminary evaluation that is not atrial      fibrillation.  He has frequent bursts of this but they are short      lived none lasting longer than an hour.  He has mode switching and      so this should not be contributing to his cardiomyopathy.  He will      be seen in the pacemaker  clinic in a few weeks and they can      evaluate this again.  3. Followup.  We will see him  back in 6 weeks for the next med      titration.Rollene Rotunda, MD, [facility]  Electronically Signed    JH/MedQ  DD: 08/08/2007  DT: 08/09/2007  Job #: [id number]   cc:   Loraine Leriche A. Perini, M.D.

## 2010-11-01 NOTE — Assessment & Plan Note (Signed)
[facility]                            CARDIOLOGY OFFICE NOTE   Steven Macdonald, Steven Macdonald                       MRN:          [id number]  DATE:05/23/2007                            DOB:          1934/07/12    PRIMARY CARE PHYSICIAN:  Dr. Rodrigo Ran.   REASON FOR PRESENTATION:  Evaluate patient with cardiomyopathy.   HISTORY OF PRESENT ILLNESS:  The patient is 76 years old.  He presented  for routine followup in early November.  Because of his previously  mildly reduced ejection fraction I did another echocardiogram.  This  demonstrated that his ejection fraction has reduced with an EF 25% with  a moderate diffuse hypokinesis.  The patient had previously had a workup  that has included stress perfusion studies.  Last Cardiolite was in 11/20/04  which demonstrated no evidence of ischemia.  His EF was 44%.  The  etiology of this was felt to be nonischemic, though he has never had  cardiac catheterization.  He is quite active, and he denies any chest  discomfort, neck or arm discomfort.  He has no palpitations, presyncope  or syncope.  He has no PMD or orthopnea.   Of note the patient does have symptomatic bradycardia with syncope and  has a permanent dual chamber pacemaker.  He paces almost exclusively  from the atrial with no ventricular pacing required.   I did start him on lisinopril when I last saw him.  He has tolerated  this.   PAST MEDICAL HISTORY:  1. Hypertension.  2. Symptomatic bradycardia with syncope (status post Medtronic      permanent dual chamber pacemaker), mildly reduced ejection fraction      in the past (approximately 45%, nonischemic, now 25%).  3. Dyslipidemia.  4. Benign prostatic hypertrophy status post TURP.   ALLERGIES:  None.   MEDICATIONS:  1. Omega-3 fish oil.  2. Simvastatin 40 mg daily.  3. Lisinopril 5 mg daily.   REVIEW OF SYSTEMS:  As stated in the HPI, and, otherwise, negative for  other systems.   PHYSICAL  EXAMINATION:  GENERAL:  The patient is in no distress.  VITAL SIGNS:  Blood pressure 125/72, heart rate 60 and regular.  HEENT:  Eyes unremarkable, pupils equal, round, and reactive to light.  Fundi not visualized.  Oral mucosa unremarkable.  NECK:  No jugular venous distention at 45 degrees, carotid upstroke  brisk and symmetric, no bruits, no thyromegaly.  LYMPHATICS:  No cervical, axillary, inguinal adenopathy.  LUNGS:  Clear to auscultation bilaterally.  BACK:  No costovertebral angle tenderness.  CHEST:  PMI is displaced to the left midclavicular line and mildly  sustained.  There is no S3, no S4, no clicks, no rubs, no murmurs.  ABDOMEN:  Flat, positive bowel sounds normal in frequency and pitch.  No  bruits, no rebound, no guarding midline pulsatile mass, no hepatomegaly,  no splenomegaly.  SKIN:  No rashes, no nodules.  EXTREMITIES:  2+ upper pulses, no edema, no cyanosis or clubbing.  NEUROLOGICAL:  Oriented to person, place, and time.  Cranial nerves II-  XII grossly  intact.  Motor grossly intact.   ASSESSMENT/PLAN:  1. Cardiomyopathy.  The patient has a progressive reduction in his      ejection fraction.  No symptoms consistent with angina.  He has had      negative stress perfusion studies in the past for any evidence of      ischemia or infarct.  The etiology of this is not entirely clear.      I do not suspect any infiltrate of her secondary process.  For now      I am going to treat this medically with titration of medications.      We discussed this at length.  He has tolerated a low dose of ACE      inhibitor.  I am going to start carvedilol 3.125 mg b.i.d.  Will      see him back fairly frequently for medication titration.  2. Status post syncope with permanent pacemaker placement.  He will      follow with Dr. Graciela Husbands in March.  He is in atrial pacing, and so his      pacemaker should not be contributing to his reduced ejection      fraction.  3. Dyslipidemia per  Dr. Waynard Edwards.  4. Followup.  I will see him back in four weeks or sooner if needed.     Rollene Rotunda, MD, [facility]  Electronically Signed    JH/MedQ  DD: 05/23/2007  DT: 05/23/2007  Job #: [id number]   cc:   Loraine Leriche A. Perini, M.D.  Reola Mosher, M.D.

## 2010-11-01 NOTE — Assessment & Plan Note (Signed)
[facility]                            CARDIOLOGY OFFICE NOTE   Steven Macdonald, Steven Macdonald                       MRN:          [id number]  DATE:04/26/2007                            DOB:          12-09-1934    PRIMARY CARE PHYSICIAN:  Dr. Rodrigo Ran.   REASON FOR PRESENTATION:  Evaluate patient with mildly reduced ejection  fraction.   HISTORY OF PRESENT ILLNESS:  Patient is 76 years old.  Steven Macdonald presents for  followup.  Steven Macdonald has seen Dr. Graciela Husbands routinely for management of sinus node  dysfunction and syncope status post pacemaker placement.  I have seen  him in the past with a mildly reduced ejection fraction.  This was noted  in 2002.  Steven Macdonald has not had any symptoms related to this.  Steven Macdonald has had  stress perfusion studies.  The last was in 2006.  His ejection fraction,  I note, was said to be 44% at that time.  Steven Macdonald had mild global  hypokinesis, but Steven Macdonald has not had any perfusion defects.   The patient denies any symptoms.  Steven Macdonald gets along quite well, and says  that Steven Macdonald has no shortness of breath, PND, or orthopnea.  Steven Macdonald can be quite  vigorous in his activities.  Steven Macdonald even splits wood.  With this, Steven Macdonald gets  none of the above mentioned symptoms.  Steven Macdonald has had no further syncope.   PAST MEDICAL HISTORY:  1. Hypertension.  2. Symptomatic bradycardia with syncope (status post Medtronic      permanent dual-chamber pacemaker).  3. Mildly reduced ejection fraction (approximately 45%, nonischemic).  4. Dyslipidemia.  5. Benign prostatic hypertrophy status post TURP.   ALLERGIES:  NONE.   MEDICATIONS:  1. Simvastatin 40 mg daily.  2. Omega 3 fish oil.   REVIEW OF SYSTEMS:  As stated in the HPI, and otherwise negative for  other systems.   PHYSICAL EXAMINATION:  The patient is in no distress.  Blood pressure 128/84.  Heart rate 63 and regular.  Weight 188 pounds.  Body mass index 23.  HEENT:  Eyelids unremarkable.  Pupils equal, round, and reactive to  light.  Fundi not  visualized.  Oral mucosa unremarkable.  NECK:  No jugular venous distention at 45 degrees.  Carotid upstroke  brisk and symmetric.  No bruits.  No thyromegaly.  LYMPHATICS:  No cervical, axillary, or inguinal adenopathy.  LUNGS:  Clear to auscultation without wheezing or crackles.  No dullness  to percussion.  BACK:  No costovertebral angle tenderness.  CHEST:  His PMI is displaced to a little bit at left of the  midclavicular line and mildly sustained.  There is no S3.  No S4.  No  clicks.  No rubs.  No murmurs.  ABDOMEN:  Flat with positive bowel sounds.  Normal in frequency and  pitch.  No bruits.  No rebound.  No guarding.  No midline pulsatile  mass.  No hepatomegaly.  No splenomegaly.  SKIN:  No rashes.  No nodules.  EXTREMITIES:  Two plus pulses.  No edema.  No cyanosis.  No clubbing.  NEUROLOGIC:  Oriented to person, place, and time.  Cranial nerves 2  through 12 grossly intact.  Motor grossly intact.  EKG:  Atrial ventricular paced rhythm with 100% capture.   ASSESSMENT AND PLAN:  1. Cardiomyopathy.  The patient had a mildly reduced ejection fraction      in the past.  Physical exam does indicate the PMI is slightly      displaced laterally.  I am going to look at this to make sure it is      not reduced further.  We will get an echocardiogram.  Steven Macdonald will have      a low threshold for ACE inhibition.  2. Status post syncope and permanent pacemaker placement.  Steven Macdonald will      follow in March with Dr. Graciela Husbands.  3. Dyslipidemia.  Per Dr. Waynard Edwards.  4. Followup.  I will see the patient back about every 18 to 24 months,      or as needed.     Rollene Rotunda, MD, [facility]  Electronically Signed    JH/MedQ  DD: 04/26/2007  DT: 04/26/2007  Job #: [id number]   cc:   Loraine Leriche A. Perini, M.D.

## 2010-11-01 NOTE — Assessment & Plan Note (Signed)
[facility]                            CARDIOLOGY OFFICE NOTE   Steven Macdonald, Steven Macdonald                       MRN:          [id number]  DATE:11/22/2007                            DOB:          12-12-1934    PRIMARY CARE PHYSICIAN:  Loraine Leriche A. Perini, M.D.   REASON FOR PRESENTATION:  Evaluate patient with cardiomyopathy.   HISTORY OF PRESENT ILLNESS:  The patient is 76 years old.  He presents  for med titration.  Unfortunately, he did not understand the instruction  at the last visit which was to go up to 9.375 mg twice a day of the  carvedilol.  He has only been taking the previous dose which was 6.25  twice a day.  He says he feels great.  He has good energy.  Has no  shortness of breath, PND or orthopnea.  He has had no palpitation,  presyncope or syncope.   PAST MEDICAL HISTORY:  1. Hypertension.  2. Symptomatic bradycardia with syncope (status post Medtronic      permanent dual-chamber pacemaker), reduced ejection fraction (45%      in the past, most recently 25%.  A presumed nonischemic negative      stress perfusion study).  3. Dyslipidemia.  4. Atrial arrhythmias.  5. Benign prostatic hypertrophy status post TURP.   ALLERGIES:  NONE.   MEDICATIONS:  1. Simvastatin 40 mg daily.  2. Lisinopril 5 mg b.i.d.  3. Aspirin 81 mg daily.  4. Metamucil.  5. Carvedilol 6.25 mg b.i.d.  6. Flaxseed oil.   REVIEW OF SYSTEMS:  As stated in the HPI and otherwise negative for  other systems.   PHYSICAL EXAMINATION:  GENERAL:  The patient is in no distress.  VITAL SIGNS:  Blood pressure 114/80, heart rate 62 and regular, weight  185 pounds, body mass index 23.  NECK:  No jugular venous distention at 45 degrees.  Carotid upstroke  brisk and symmetric.  No bruits, no thyromegaly.  LYMPHATICS:  No lymphadenopathy.  LUNGS:  Clear to auscultation bilaterally.  BACK:  No costovertebral angle tenderness.  CHEST:  Unremarkable.  HEART:  PMI not displaced or  sustained, S1-S2 within normal limits.  No  S3-S4, no clicks, rubs, murmurs.  ABDOMEN:  Flat, positive bowel sounds normal in frequency and pitch, no  bruits, rebound, guarding, no midline pulsatile mass, no organomegaly.  SKIN:  No rashes, no nodules.  EXTREMITIES:  2+ pulses, no edema.   DIAGNOSTICS:  EKG:  Atrial paced rhythm, left bundle branch block.   ASSESSMENT/PLAN:  1. Cardiomyopathy.  Today I reiterated that I want him to be taking      9.375 mg twice a day of the carvedilol.  If he tolerates this for a      month, he will go up to 12.5 mg twice a day.  He will continue on      his ACE inhibitor at the current dose.  2. Bradyarrhythmia.  Patient has had no recurrent syncope or      presyncope.  No further cardiovascular testing is suggested.  He      will  be followed in our pacemaker clinic.   FOLLOW UP:  I will see the patient again in about 2 months to see if I  want to make any further med titrations.     Rollene Rotunda, MD, [facility]  Electronically Signed    JH/MedQ  DD: 11/22/2007  DT: 11/23/2007  Job #: [id number]   cc:   Loraine Leriche A. Perini, M.D.

## 2010-11-01 NOTE — Telephone Encounter (Signed)
I attempted to call the pt at his contact #. This went to his work and I was told he will be out of the office until 11/15/10. Home # is listed as the same. I attempted to call the cell # listed in his chart and the voice mail stated that it was for a "Yolanda." I will call the patient back on 5/29.

## 2010-11-04 NOTE — H&P (Signed)
NAME:  Steven Macdonald, Steven Macdonald NO.:  [id number]   MEDICAL RECORD NO.:  [id number]          PATIENT TYPE:  EMS   LOCATION:  ED                           FACILITY:  [facility]   PHYSICIAN:  Rollene Rotunda, M.D.   DATE OF BIRTH:  Dec 21, 1934   DATE OF ADMISSION:  08/09/2004  DATE OF DISCHARGE:                                HISTORY & PHYSICAL   CHIEF COMPLAINT:  Syncope.   PRIMARY CARE PHYSICIAN:  Mark A. Perini, M.D.   HISTORY OF PRESENT ILLNESS:  This is a pleasant 76 year old male, followed  by Dr. Antoine Poche in our group.  The patient was evaluated by Dr. Antoine Poche in  2002 for a left bundle branch block and bradycardia.  At that time, the  patient had a Persantine Cardiolite performed that showed no ischemia,  however, the ejection fraction was mildly low at 50%.  He subsequently had a  2D echo that showed a normal ejection fraction.  The patient's bradycardia  had been asymptomatic and no further workup or intervention was recommended  at that time.  This morning the patient awoke and felt like his blood pressure was high.  He checked it at home and found his blood pressure to be approximately  188/88.  He did not feel well.  He went on to work.  He felt dizzy and  spacey.  He had some visual changes that he describes as granular type  vision.  He had two syncopal episodes at work, 911 was called.  He was found  to be bradycardic with a rate in the 30s.  He was brought to [facility].  He states he feels fine now, however, his heart  rate is still in the 40 range, occasionally in the 50 range.  He denies any  chest pain or shortness of breath or tachy arrhythmias associated with  today's episodes.   PAST MEDICAL HISTORY:  1.  Hypertension for 3-4 years.  This is being followed and treated by Dr.      Rodrigo Ran.  He denies any history of diabetes mellitus.  He states that his cholesterol  has been fine on previous exams.  1.  As noted, he was  seen by Dr. Antoine Poche in 2002 for bradycardia and a left      bundle branch block on  EKG.  He had a negative echo and negative      Cardiolite at that time.  2.  He had a colonoscopy in the past that showed a precancerous lesion.  3.  He does have a long history of a slow heart rate, according to the      patient.   ALLERGIES:  No known drug allergies.   MEDICATIONS PRIOR TO ADMISSION:  Benazepril/HCT 10/12.5 one daily.   SOCIAL HISTORY:  The patient lives in [place].  He is widowed.  His wife  died from cancer.  He has four children.  He does have a significant other  at this point, however, he has been under a fair amount of stress due to  some problems with his  relationship and his father's declining health.  The  patient used to smoke a pipe.  He quit in 1974.  He drinks alcohol  approximately two glasses of wine per day, sometimes a little more.  He  works as an Pensions consultant.   FAMILY HISTORY:  The patient's father is 89 years old.  He is in poor  health.  He is in a nursing home and is not expected to live much longer.  He has a history of an MI and a CVA while in his 32s.  His mother died in  her 41s from cancer.  He is an only child.   REVIEW OF SYSTEMS:  As noted, the patient has been under a great deal of  stress recently.  He also had a recent upper respiratory tract infection.  He has had some headaches.  He noted some visual changes today associated  with the syncopal episodes.  He was recently evaluated for a retinal tear  that was later thought to be old.  He does have some type of lesion on his  foot that he treats with a steroid cream from time to time.  He denies any  recent chest pain, shortness of breath, lower extremity edema or  palpitations.  He has had presyncope and syncope today.  He has degenerative  joint disease of the spine.  He has a history of colon polyps.   PHYSICAL EXAMINATION:  GENERAL:  Reveals a pleasant 76 year old white male  with somewhat of  a flat affect.  VITAL SIGNS:  Blood pressure 104/65, respirations 16, temperature 97.8,  pulse rate 42.  HEENT:  Unremarkable.  NECK:  Reveals no bruits, no jugular venous distention.  HEART:  Reveals a slow but regular rhythm with a grade 2/6 systolic murmur.  LUNGS:  Clear.  ABDOMEN:  Soft, nontender.  EXTREMITIES:  Reveal pulses to be intact without edema.  SKIN:  Warm and dry.   LABORATORY DATA:  Chest x-ray is pending.  An EKG shows sinus bradycardia,  rate 38 beats per minute with a left bundle branch block.  Point-of-care  enzymes are negative x 1.  A CBC revealed hemoglobin 15.6, hematocrit 46.7,  WBC 6.6 thousand, platelets 159,000.  BUN is 12, creatinine 1.1, potassium  4.0, glucose 104.   IMPRESSION:  1.  Syncope.  2.  Long history of bradycardia with extremely slow rate today in the 30s      and 40s.  3.  History of hypertension with an elevated blood pressure this morning.  4.  Left bundle branch block, old.  5.  Persantine Cardiolite, November 21, 2000, no ischemia, ejection fraction 50%.  6.  A 2D echocardiogram performed following the Cardiolite that revealed      normal left ventricular function.  7.  Recent increased stress in his life.   PLAN:  1.  I discussed this patient by phone with Dr. Antoine Poche.  He will be over to      see the patient shortly.  2.  We will place external pacer pads with plans to transfer to [facility] for a probable permanent pacemaker.  3.  We will check cardiac enzymes to rule out any cardiac damage.      DR/MEDQ  D:  08/09/2004  T:  08/09/2004  Job:  [id number]   cc:   Loraine Leriche A. Waynard Edwards, M.D.  [address]  [place]  [place] [address]  Fax: [phone]

## 2010-11-04 NOTE — Discharge Summary (Signed)
NAME:  Steven Macdonald, Steven Macdonald NO.:  [id number]   MEDICAL RECORD NO.:  [id number]          PATIENT TYPE:  OUT   LOCATION:  CATH                         FACILITY:  [facility]   PHYSICIAN:  Duke Salvia, M.D.  DATE OF BIRTH:  05/06/1935   DATE OF ADMISSION:  08/11/2004  DATE OF DISCHARGE:  08/13/2004                                 DISCHARGE SUMMARY   ADDENDUM:  This addendum covers the fact that the patient has a left upper  extremity infiltrate based cellulitis, affecting the arm above the  antecubital space. This is most probably just inflammatory and will pass.  However, in setting of implantation of pacemaker roughly 36 hours ago, we  will provide Keflex support for 5 days. The patient will go home on Keflex  500 mg 1 tablet four times daily.      GM/MEDQ  D:  08/12/2004  T:  08/12/2004  Job:  [id number]   cc:   Loraine Leriche A. Waynard Edwards, M.D.  [address]  [place]  [place] [address]  Fax: [phone]   Valetta Fuller, M.D.  [address]  [place]  [place] [address]  Fax: [phone]   Duke Salvia, M.D.

## 2010-11-04 NOTE — Assessment & Plan Note (Signed)
 HEALTHCARE                         ELECTROPHYSIOLOGY OFFICE NOTE   DARRIO, BADE                       MRN:          [id number]  DATE:09/03/2006                            DOB:          [birth year]    Mr. Junkins was seen today in the clinic on September 03, 2006, for followup  of his Medtronic model #E2DR01 EnPulse.  Date of implant was August 11, 2004, for syncope and sinus node dysfunction.  On interrogation of  his device today, his battery voltage is 2.79.  P waves measured greater  than 5.6 millivolts with an atrial capture threshold of 0.5 volts at 0.4  milliseconds and an atrial lead impedance of 545 ohms.  R waves measured  8.0 to 11.20 millivolts with a ventricular capture threshold of 0.75  volts at 0.4 milliseconds and a ventricular lead impedance of 595 ohms.  There were 239 mode switch episodes totaling less than 0.1% of the time.  The longest was 2 hours 15 minutes and this gentleman is not on Coumadin  therapy at this time, and his regular cardiologist is Dr. Antoine Poche here  at the practice.  No changes were made in his parameters.  He will  continue with his [facility] transmissions with a return office visit in 1  year.      Altha Harm, LPN  Electronically Signed      Duke Salvia, MD, [facility]  Electronically Signed   PO/MedQ  DD: 09/03/2006  DT: 09/03/2006  Job #: [id number]

## 2010-11-04 NOTE — Discharge Summary (Signed)
NAME:  Steven Macdonald, Steven Macdonald NO.:  [id number]   MEDICAL RECORD NO.:  [id number]          PATIENT TYPE:  INP   LOCATION:  0157                         FACILITY:  [facility]   PHYSICIAN:  Duke Salvia, M.D.  DATE OF BIRTH:  [birth year]   DATE OF ADMISSION:  08/09/2004  DATE OF DISCHARGE:  08/12/2004                                 DISCHARGE SUMMARY   DISCHARGE DIAGNOSIS:  1.  Post implantation of Medtronic EnPulse pacemaker by Dr. Sherryl Manges.  2.  Presyncope/syncope in the setting of bradycardia and left bundle branch      block on admission August 09, 2004.  3.  Urinary retention requiring prolonged placement of Foley catheter.   SECONDARY DIAGNOSES:  1.  Workup for bradycardia in 2002 with demonstration of left bundle branch      block.  Persantine Cardiolite showed no ischemia, ejection fraction 50%.      Subsequent 2D echocardiogram showed normal ejection fraction.  2.  Hypertension.  3.  Colonoscopy which showed precancerous lesion.  4.  History of bradycardia.   PROCEDURE:  1.  On August 11, 2004, implantation of Medtronic permanent pacemaker by      Dr. Sherryl Manges.  2.  On August 12, 2004, a tilt table study.  The patient has been      demonstrating increased heart rate with going from sitting to standing      position.   DISCHARGE DISPOSITION:  Steven Macdonald is discharging after undergoing tilt table  study.  Results are pending at the time of this dictation.  He is afebrile,  vital signs have been stable.  At the current time, he is A-pacing in the  3s.  He was feeling fine.  His pacer pocket site is without swelling or  erythema.   DISCHARGE MEDICATIONS:  He goes home on the following medications:  1.  Benazepril/hydrochlorothiazide 10/12.5 mg 1 tablet daily.  2.  A new medication for one day, Pyridium 100 mg three times daily.  3.  Flomax 0.4 mg daily at bedtime.  The patient will have his Foley      catheter remain in place until Monday, August 15, 2004.  He is to      remove the catheter by using a syringe which has been provided to      deflate the balloon.  He will be given a larger bag for overnight use      but he will also have a leg bag for use during the day for discomfort at      the pacemaker site.  4.  Tylenol 325 mg 1-2 tablets every four to six hours.   DISCHARGE ACTIVITY:  As tolerated.  He has been given an activity sheet  describing the use of the right upper extremity.   DISCHARGE DIET:  Low sodium, low cholesterol diet.   FOLLOWUP:  1.  Will follow up at [facility] at [address].  2.  Pacer clinic Monday, August 29, 2004 at 10 a.m.  3.  Will see Dr. Graciela Husbands in May 2006.  Dr.  Klein's office will out      appointment.   Mr. Arey has been instructed to call Dr. Ellin Goodie office if he still has  problems on Monday, August 15, 2004 after Foley catheter has been removed.   BRIEF HISTORY:  Steven Macdonald is a 76 year old male.  He has previously  been followed by Dr. Antoine Poche in [facility] for bradycardia and left  bundle branch block. He was assessed for this in 2002.  At that time, a  Persantine Cardiolite was performed which showed no ischemia, ejection  fraction mildly depressed at 50%.  A subsequent 2D echocardiogram showed  normal left ventricular dysfunction.  The patient's bradycardia had been  asymptomatic and no further workup was recommended at that time.  However,  on the morning of August 09, 2004, four days ago, the patient woke up and  felt his blood pressure was high.  He checked his blood pressure on his own  home machine and found it was 188/88.  He did not feel well.  He went to  work, however, felt dizzy and spacey.  He had some visual changes that he  describes as a granular type vision.  He had two syncopal episodes at work  and 911 was called.  He was found to be bradycardic with a rate in the 30s.  He was brought to [facility] Emergency Room.  At  the time of this  interview and history and physical, he is feeling fine, however, his heart  is still in the 40s, occasionally bouncing into the 50s.  The patient denies  any chest pain or shortness of breath or any tachyarrhythmias associated  with today's episodes.  The plan will be to admit the patient.  Transcutaneous pacer pads will be placed and he will be scheduled for a  permanent pacemaker implantation.   HOSPITAL COURSE:  The patient was admitted to [facility] through  the emergency room, subsequently to the intensive care unit with  transcutaneous pacer pads in place and after treatment in the room, his  heart rate has remained low and the patient has not been particularly  symptomatic, however, when bedridden.  He was scheduled for a pacemaker  implantation and this was done on 08/11/04 without difficulty.  A Medtronic  pacemaker was implanted by Dr. Graciela Husbands.  He has had no postoperative  complications from the pacemaker site and he has atrial pacing at the time  of discharge.  However, he has had post implantation urinary retention  causing the Foley catheter to be reinserted.  Dr. Ellin Goodie office, Dr.  Vonita Moss has been consulted, recommended keeping the Foley in  place, starting the patient on Flomax, and then subsequent removal on  Monday, August 15, 2004.  Mr. Casella has been instructed to call Dr.  Ellin Goodie office if he has any further problems with voiding after removing  his Foley catheter.      GM/MEDQ  D:  08/12/2004  T:  08/12/2004  Job:  [id number]   cc:   Duke Salvia, M.D.   Redge Gainer. Waynard Edwards, M.D.  [address]  [place]  [place] [address]  Fax: [phone]   Valetta Fuller, M.D.  [address]  [place]  [place] [address]  Fax: [phone]

## 2010-11-04 NOTE — Op Note (Signed)
NAME:  Steven Macdonald, GURA NO.:  [id number]   MEDICAL RECORD NO.:  [id number]          PATIENT TYPE:  OUT   LOCATION:  CATH                         FACILITY:  [facility]   PHYSICIAN:  Duke Salvia, M.D.  DATE OF BIRTH:  01-13-1935   DATE OF PROCEDURE:  08/11/2004  DATE OF DISCHARGE:                                 OPERATIVE REPORT   PREOPERATIVE DIAGNOSIS:  Syncope with sinus node dysfunction and orthostatic  intolerance.   POSTOPERATIVE DIAGNOSIS:  Syncope with sinus node dysfunction and  orthostatic intolerance.   PROCEDURES:  1.  Dual-chamber pacemaker implantation.  2.  Contrast venography.   DESCRIPTION OF PROCEDURE:  Following the attainment of informed consent, the  patient was brought to the electrophysiology laboratory and placed on the  fluoroscopic table in the supine position.  After routine prep and drape of  the left upper chest, lidocaine was infiltrated in the prepectoral  subclavicular region.  An incision was made and carried down to the  prepectoral fascia using electrocautery and sharp dissection.  A pocket was  formed similarly.  Hemostasis was obtained.   Thereafter attention was turned to gaining access to the extrathoracic right  subclavian vein which was accomplished with moderate difficulty with  puncture of the artery on three occasions.  Ultimately venipuncture was  accomplished and we double wired the vein.  Notably, pressure had been had  for three to five minutes after the arterial punctures.  Sequentially 7  French tear-away introducer sheaths were placed, which were then passed and  Medtronics 15 76 52 cm active fixation ventricular lead, serial #[id number].  The atrial lead was a Medtronics 15 76 45 cm lead, serial #[id number].  The  ventricular lead was marked with a tie.  These leads were manipulated to the  right ventricular apex and the right atrial appendage, respectively.  The  apical location being tolerated because of the  patient's good intrinsic  conduction, notwithstanding his left bundle branch block.  The bipolar R  wave was 12.1 mV with a pacing impendence of 11 ohms and a threshold of 1.1  volts at 0.5 msec.  The current threshold was 1.2.  There was no  diaphragmatic pacing at 10 volts.   The bipolar P wave was 2 mV with a pacing impendence of 1000 ohms and a  threshold of 1.1 volts at 0.5 msec.  The current threshold was 1.2 MA.  With  these acceptable parameters recorded, the leads were secured to the  prepectoral fascia and then attached to a Medtronic Impulse E2 DR01 pulse  generator, serial #[id number].  Ventricular and AV pacing were identified.  The pocket was closely irrigated with antibiotic-containing solution.  Hemostasis was assured.  The leads of the pulse generator were placed in the  pocket and secured to the prepectoral fascia.  The  wound was washed and dried and a Benzoin and Steri-Strip dressing was then  applied.  The needle counts, sponge counts and instrument counts were  correct at the end of the procedure, according to the staff.   The patient tolerated the procedure without apparent  complication.      SCK/MEDQ  D:  08/11/2004  T:  08/11/2004  Job:  [id number]   cc:   Loraine Leriche A. Waynard Edwards, M.D.  [address]  [place]  [place] [address]  Fax: [phone]   Electrophysiology Laboratory   [facility]

## 2010-11-15 NOTE — Telephone Encounter (Signed)
Addended by: Sherri Rad C on: 11/15/2010 02:21 PM   Modules accepted: Orders

## 2010-11-15 NOTE — Telephone Encounter (Signed)
I have spoken with the patient. He cannot come to the open echo slot on Thursday due to a graduation for his granddaughter. Burna Mortimer has given the ok for the patient to come on Monday 11/21/10 @ 2:00pm. The patient is aware and agreeable. I have spoken to [place] to make the appt.

## 2010-11-15 NOTE — Telephone Encounter (Signed)
LM at contact number left on initial message to call.

## 2010-11-21 ENCOUNTER — Ambulatory Visit (HOSPITAL_COMMUNITY): Payer: Medicare Other | Attending: Internal Medicine

## 2010-11-21 DIAGNOSIS — I080 Rheumatic disorders of both mitral and aortic valves: Secondary | ICD-10-CM | POA: Insufficient documentation

## 2010-11-21 DIAGNOSIS — I4891 Unspecified atrial fibrillation: Secondary | ICD-10-CM

## 2010-11-21 DIAGNOSIS — I079 Rheumatic tricuspid valve disease, unspecified: Secondary | ICD-10-CM | POA: Insufficient documentation

## 2010-11-21 DIAGNOSIS — I379 Nonrheumatic pulmonary valve disorder, unspecified: Secondary | ICD-10-CM | POA: Insufficient documentation

## 2010-11-21 DIAGNOSIS — I428 Other cardiomyopathies: Secondary | ICD-10-CM

## 2010-11-22 ENCOUNTER — Encounter: Payer: Medicare Other | Admitting: Internal Medicine

## 2010-11-22 ENCOUNTER — Ambulatory Visit (INDEPENDENT_AMBULATORY_CARE_PROVIDER_SITE_OTHER): Admitting: Internal Medicine

## 2010-11-22 ENCOUNTER — Encounter: Payer: Self-pay | Admitting: Internal Medicine

## 2010-11-22 DIAGNOSIS — I4892 Unspecified atrial flutter: Secondary | ICD-10-CM

## 2010-11-22 DIAGNOSIS — I428 Other cardiomyopathies: Secondary | ICD-10-CM

## 2010-11-22 NOTE — Progress Notes (Signed)
  HPI  Steven Macdonald is a 76 y.o. male Seen on recent cardioversion for atrial flutter nose associated with significant cardiomyopathy.  TEE in march 2012 showed ejection fraction of 25% and he underwent cardioversion. Echo yesterday showed 55%.  Exercise tolerance is back to normal. He is currently on warfarin. He has a prior history of pulmonary embolism.  Past Medical History  Diagnosis Date  . HLD (hyperlipidemia)   . HTN (hypertension)   . Pulmonary embolism   . Cardiomyopathy, nonischemic     Echo 2008: EF 25% Cardiac CT 2009: Normal cors EF 31% Echo 3/10 EF 45% Echo 3/12: EF 20-25% moderate MR (in setting of AFL)  . LBBB (left bundle branch block)   . Bradycardia     s/p PPM  . ED (erectile dysfunction)   . Hemorrhoids   . Atrial flutter     Onset 3/12    Past Surgical History  Procedure Date  . Pacemaker insertion   . Cystoscopy w/ transurethral resection of posterior uretheral valves     Current Outpatient Prescriptions  Medication Sig Dispense Refill  . carvedilol (COREG) 25 MG tablet Take 25 mg by mouth 2 (two) times daily with a meal.        . cholecalciferol (VITAMIN D) 1000 UNITS tablet Take 1,000 Units by mouth daily.        . fish oil-omega-3 fatty acids 1000 MG capsule 1 tab po qd      . lisinopril (PRINIVIL,ZESTRIL) 10 MG tablet Take 10 mg by mouth 2 (two) times daily.        . Psyllium (METAMUCIL PO) 1 tablet daily.       . sildenafil (VIAGRA) 100 MG tablet Take 100 mg by mouth daily as needed.        . simvastatin (ZOCOR) 40 MG tablet Take 40 mg by mouth. 1/2 tb po bid       . warfarin (COUMADIN) 5 MG tablet Take 5 mg by mouth daily.          No Known Allergies  Review of Systems negative except from HPI and PMH  Physical Exam Well developed and well nourished in no acute distress regfular rate and rhythm No edema    Assessment and  Plan

## 2010-11-22 NOTE — Assessment & Plan Note (Signed)
Intercurrent normalization of LV function. Hoorray

## 2010-11-22 NOTE — Assessment & Plan Note (Signed)
Status post cardioversion. We will schedule Steven Macdonald for a catheter ablation of his atrial flutter sometime in August after he returns from [place]. We will do this on Coumadin. We have reviewed potential risks and benefits. He and his wife are agreeable to proceeding

## 2010-11-29 ENCOUNTER — Encounter: Payer: Self-pay | Admitting: Internal Medicine

## 2010-12-06 ENCOUNTER — Other Ambulatory Visit: Payer: Self-pay | Admitting: Cardiology

## 2011-01-03 ENCOUNTER — Other Ambulatory Visit: Payer: Self-pay | Admitting: *Deleted

## 2011-01-03 MED ORDER — LISINOPRIL 10 MG PO TABS: 10.0000 mg | ORAL_TABLET | Freq: Two times a day (BID) | ORAL | Status: DC

## 2011-01-04 ENCOUNTER — Telehealth: Payer: Self-pay | Admitting: *Deleted

## 2011-01-04 ENCOUNTER — Encounter: Payer: Self-pay | Admitting: *Deleted

## 2011-01-04 NOTE — Telephone Encounter (Signed)
I spoke with the patient. He is scheduled for his a-flutter ablation on 02/08/11 with Dr. Graciela Husbands. I will verify with Dr. Graciela Husbands if the patient needs to hold carvedilol or not and will send the patient his instructions. He verbalizes understanding.

## 2011-01-04 NOTE — Telephone Encounter (Signed)
I left a message for the patient to call about scheduling his a-flutter ablation for August. This will be done after he returns from his [place] trip.

## 2011-01-27 ENCOUNTER — Telehealth: Payer: Self-pay | Admitting: *Deleted

## 2011-01-27 ENCOUNTER — Encounter: Payer: Self-pay | Admitting: *Deleted

## 2011-01-27 DIAGNOSIS — Z01810 Encounter for preprocedural cardiovascular examination: Secondary | ICD-10-CM

## 2011-01-27 DIAGNOSIS — I4892 Unspecified atrial flutter: Secondary | ICD-10-CM

## 2011-01-27 NOTE — Telephone Encounter (Signed)
Lab order

## 2011-02-03 ENCOUNTER — Ambulatory Visit (INDEPENDENT_AMBULATORY_CARE_PROVIDER_SITE_OTHER): Payer: Medicare Other

## 2011-02-03 ENCOUNTER — Other Ambulatory Visit (INDEPENDENT_AMBULATORY_CARE_PROVIDER_SITE_OTHER): Payer: Medicare Other | Admitting: *Deleted

## 2011-02-03 VITALS — BP 120/70 | HR 61 | Ht 75.0 in | Wt 188.0 lb

## 2011-02-03 DIAGNOSIS — I4892 Unspecified atrial flutter: Secondary | ICD-10-CM

## 2011-02-03 DIAGNOSIS — Z01810 Encounter for preprocedural cardiovascular examination: Secondary | ICD-10-CM

## 2011-02-03 DIAGNOSIS — R0989 Other specified symptoms and signs involving the circulatory and respiratory systems: Secondary | ICD-10-CM

## 2011-02-03 LAB — CBC WITH DIFFERENTIAL/PLATELET
Basophils Absolute: 0 10*3/uL (ref 0.0–0.1)
Basophils Relative: 0.7 % (ref 0.0–3.0)
Eosinophils Absolute: 0.4 10*3/uL (ref 0.0–0.7)
Eosinophils Relative: 6.1 % — ABNORMAL HIGH (ref 0.0–5.0)
HCT: 43.1 % (ref 39.0–52.0)
Hemoglobin: 14.2 g/dL (ref 13.0–17.0)
Lymphocytes Relative: 42.5 % (ref 12.0–46.0)
Lymphs Abs: 2.5 10*3/uL (ref 0.7–4.0)
MCHC: 33 g/dL (ref 30.0–36.0)
MCV: 87.1 fl (ref 78.0–100.0)
Monocytes Absolute: 0.5 10*3/uL (ref 0.1–1.0)
Monocytes Relative: 9.4 % (ref 3.0–12.0)
Neutro Abs: 2.4 10*3/uL (ref 1.4–7.7)
Neutrophils Relative %: 41.3 % — ABNORMAL LOW (ref 43.0–77.0)
Platelets: 111 10*3/uL — ABNORMAL LOW (ref 150.0–400.0)
RBC: 4.94 Mil/uL (ref 4.22–5.81)
RDW: 14.5 % (ref 11.5–14.6)
WBC: 5.8 10*3/uL (ref 4.5–10.5)

## 2011-02-03 LAB — BASIC METABOLIC PANEL
BUN: 19 mg/dL (ref 6–23)
CO2: 27 mEq/L (ref 19–32)
Calcium: 9.3 mg/dL (ref 8.4–10.5)
Chloride: 106 mEq/L (ref 96–112)
Creatinine, Ser: 1.1 mg/dL (ref 0.4–1.5)
GFR: 67.46 mL/min (ref >60.00)
Glucose, Bld: 83 mg/dL (ref 70–99)
Potassium: 4.2 mEq/L (ref 3.5–5.1)
Sodium: 139 mEq/L (ref 135–145)

## 2011-02-03 LAB — PROTIME-INR
INR: 2.2 ratio — ABNORMAL HIGH (ref 0.8–1.0)
Prothrombin Time: 22.6 s — ABNORMAL HIGH (ref 10.2–12.4)

## 2011-02-03 NOTE — Progress Notes (Signed)
Patient in for EKG and blood work prior cardioversion. EKG done per nurse, it reads:" Electronic atrial pacemaker rate 61 beats per minute". No c/o offers at this time. Patient refused for RN to take  EKG electrodes off his chest , he said will take care off it, himself.

## 2011-02-08 ENCOUNTER — Ambulatory Visit (HOSPITAL_COMMUNITY)
Admission: RE | Admit: 2011-02-08 | Discharge: 2011-02-08 | Disposition: A | Discharge status: Home / Self Care | Payer: Medicare Other | Source: Ambulatory Visit | Attending: Internal Medicine | Admitting: Internal Medicine

## 2011-02-08 DIAGNOSIS — I4892 Unspecified atrial flutter: Secondary | ICD-10-CM | POA: Insufficient documentation

## 2011-02-08 DIAGNOSIS — I428 Other cardiomyopathies: Secondary | ICD-10-CM | POA: Insufficient documentation

## 2011-02-08 DIAGNOSIS — Z01810 Encounter for preprocedural cardiovascular examination: Secondary | ICD-10-CM | POA: Insufficient documentation

## 2011-02-08 DIAGNOSIS — Z950 Presence of cardiac pacemaker: Secondary | ICD-10-CM | POA: Insufficient documentation

## 2011-02-08 LAB — PROTIME-INR
INR: 2.34 — ABNORMAL HIGH (ref 0.00–1.49)
Prothrombin Time: 26 seconds — ABNORMAL HIGH (ref 11.6–15.2)

## 2011-02-13 NOTE — Discharge Summary (Signed)
NAME:  Steven Macdonald, Steven Macdonald NO.:  [id number]  MEDICAL RECORD NO.:  [id number]  LOCATION:  3730                         FACILITY:  [facility]  PHYSICIAN:  Duke Salvia, MD, FACCDATE OF BIRTH:  March 28, 1935  DATE OF ADMISSION:  02/08/2011 DATE OF DISCHARGE:  02/08/2011                              DISCHARGE SUMMARY   PRIMARY CARE PHYSICIAN:  Mark A. Perini, MD  PRIMARY CARDIOLOGIST:  Bevelyn Buckles. Bensimhon, MD  ELECTROPHYSIOLOGIST:  Duke Salvia, MD, [facility]  PRIMARY DIAGNOSIS:  Atrial flutter.  SECONDARY DIAGNOSES: 1. Symptomatic bradycardia, status post Medtronic pacemaker     implantation in 2006. 2. Nonischemic cardiomyopathy initially identify in 2008 with echocardiogram showed an ejection fraction of 20% to 25% with moderate mitral regurgitation.  Cardiomyopathy thought to be tachycardia mediated.  After restoration of sinus rhythm via  cardioversion, EF improved to 55%. 3. History of pulmonary embolism for which he is on chronic Coumadin. 4. Hypertension. 5. Left bundle-branch block. 6. Benign prostatic hyperplasia, status post transurethral resection     of the prostate.  ALLERGIES:  The patient has no known drug allergies.  PROCEDURES THIS ADMISSION: 1. Electrophysiology study and radiofrequency catheter ablation of     atrial flutter on February 08, 2011, by Dr. Graciela Husbands.  This study     demonstrated typical atrial flutter.  Catheter ablation across the     cavotricuspid isthmus resulting in complete bidirectional isthmus     block was achieved. 2. Pacemaker interrogation, status post ablation, verified normal     device function.  BRIEF HISTORY OF PRESENT ILLNESS:  Steven Macdonald is a 76 year old male with a history of symptomatic sinus bradycardia, status post pacemaker implant, nonischemic cardiomyopathy, and newly diagnosed atrial flutter in March 2012.  He was evaluated by Dr. Graciela Husbands at that time with recommendation to proceed with catheter ablation.  The  patient underwent TEE cardioversion in March 2012 with subsequent recovery of his ejection fraction.  Echocardiogram from June 2012 showed demonstrating ejection fraction of 55%.  Flutter ablation was still recommended with the patient because of his previous tachycardia-induced cardiomyopathy. Risks, benefits, and alternatives of procedure were reviewed with the patient and he wished to proceed.  HOSPITAL COURSE:  The patient was admitted on February 08, 2011, with planned ablation of atrial flutter.  This was carried out by Dr. Graciela Husbands with details as outlined above.  He was monitored on telemetry, which demonstrated atrial pacing.  EKG was obtained, which demonstrated atrial pacing at a rate of 60 with intervals of 0.17/0.17/0.47.  Dr. Graciela Husbands felt the patient will be stable for discharge after his bedrest was complete and he had ambulated.  Plans will be for the patient to be discharged home later this evening after ambulation and his groin is assessed.  FOLLOWUP APPOINTMENTS: 1. Dr. Graciela Husbands on January 19, 2011, at 3:15 p.m. 2. Dr. Gala Romney as scheduled. 3. Dr. Waynard Edwards for Coumadin check on February 09, 2011, as scheduled.  DISCHARGE INSTRUCTIONS: 1. Increase activity slowly. 2. No driving for 2 days. 3. Follow low-sodium heart-healthy diet. 4. Keep incisions clean and dry.  DISCHARGE MEDICATIONS: 1. Coreg 25 mg 1 tablet twice daily. 2. Warfarin 5 mg, take 1  tablet on Saturdays and Sundays and 1-1/2     tablets for rest of the week. 3. Fish oil 1000 mg daily. 4. Lisinopril 10 mg twice daily. 5. Metamucil one pack daily as needed. 6. Simvastatin 40 mg one-half tablet twice daily. 7. Viagra 100 mg daily as needed for erectile dysfunction. 8. Vitamin D3 1000 units daily.  DISPOSITION:  The patient will be examined by the physician at this time prior to discharge.  DURATION OF DISCHARGE ENCOUNTER:  35 minutes.     Gypsy Balsam, RN,BSN   ______________________________ Duke Salvia, MD, [facility]    AS/MEDQ  D:  02/08/2011  T:  02/08/2011  Job:  [id number]  cc:   Loraine Leriche A. Perini, M.D. Bevelyn Buckles. Bensimhon, MD  Electronically Signed by Gypsy Balsam RNBSN on 02/09/2011 08:53:47 AM Electronically Signed by Sherryl Manges MD [facility] on 02/13/2011 01:54:43 PM

## 2011-02-13 NOTE — Op Note (Signed)
NAME:  Steven Macdonald, VEAL NO.:  [id number]  MEDICAL RECORD NO.:  [id number]  LOCATION:  [facility]                         FACILITY:  [facility]  PHYSICIAN:  Duke Salvia, MD, FACCDATE OF BIRTH:  [birth year]  DATE OF PROCEDURE:  02/08/2011 DATE OF DISCHARGE:                              OPERATIVE REPORT   SURGEON:  Duke Salvia, MD, [facility]  PREOPERATIVE DIAGNOSES: 1. Atrial flutter with tachycardia-induced cardiomyopathy, now     resolved. 2. Sinus node dysfunction, status post Medtronic pacemaker     implantation.  POSTOPERATIVE DIAGNOSES: 1. Atrial flutter with tachycardia-induced cardiomyopathy, now     resolved. 2. Sinus node dysfunction, status post Medtronic pacemaker     implantation.  PROCEDURES:  Invasive electrophysiological study, pacemaker interrogation, arrhythmia mapping and catheter ablation.  Following obtaining informed consent, the patient was brought to the Electrophysiology Laboratory and placed on the fluoroscopic table in supine position.  After routine prep and drape of the left, cardiac catheterization was performed with local anesthesia and conscious sedation.  Noninvasive blood pressure monitoring and transcutaneous oxygen saturation monitoring were performed continuously throughout the procedure.  Following the procedure, the catheters were removed, hemostasis was obtained and the patient was transferred to the floor in stable condition.  CATHETERS:  A 5-French quadripolar catheter was inserted via the left femoral vein to the AV junction and measured his electrogram. A 6-French octapolar catheter was inserted via the right femoral vein to the coronary sinus. A 7-French duodecapolar catheter was inserted via the left femoral vein to the high right atrium and the tricuspid annulus. A 8-French 8-mm deflectable tip ablation catheter was inserted via the right femoral vein using SAFL sheath to mapping sites in the posterior septal  space.  Surface leads I, aVF and V1 were monitored continuously throughout the procedure.  Following insertion of the  catheters and stimulation protocol included incremental atrial pacing, incremental ventricular pacing, single atrial extrastimuli.  END-TIDAL RESULTS:  End-tidal surface electrocardiogram. Rhythm:  Atrial paced; cycle length 1000 milliseconds; PR interval was 270 milliseconds. P-wave duration is 175 milliseconds. QRS duration is 175 milliseconds. QT interval is 487 milliseconds. AH interval is 109 milliseconds and HV interval is 71 milliseconds.  Rhythm final:  RR interval was 1000 milliseconds and paced. PR interval was 281 milliseconds. P-wave duration 178 milliseconds. QRS duration 178 milliseconds. QT interval 443 milliseconds. AH interval was 140 milliseconds and HV interval was about 90 milliseconds.  AV nodal function for planned AV Wenckebach was 400 milliseconds. VA conduction was one-to-one with Wenckebach of 450 milliseconds. AV nodal effective refractory period of 600 milliseconds was 410 milliseconds. AV nodal conduction was continuous.  Accessory pathway function:  No evidence of an accessory pathway was identified.  Ventricular response programmed stimulation:  Normal for ventricular stimulation as described.  Arrhythmias induced.  The patient had a history of known typical atrial flutter.  Ablation was taken empirically across the cavotricuspid isthmus resulting in bidirectional isthmus conduction block with an AA interval postablation of 140 milliseconds.  FLUOROSCOPY TIME:  A total of 10 minutes and 50 seconds of fluoroscopy time was utilized at 7.5 frames per second.  A total of 4 minutes and 33 seconds  of RF was applied across the cavotricuspid isthmus.  IMPRESSION: 1. Sinus node dysfunction, requiring pacing with a Medtronic     pacemaker, which was interrogated and reprogrammed. 2. Abnormal atrial function manifested by sustained  atrial flutter,     status post successful cavotricuspid isthmus ablation. 3. Normal atrioventricular nodal function. 4. Abnormal His-Purkinje system function with left bundle-branch block     and prolonged HV interval. 5. No accessory pathway. 6. Normal ventricular response to programmed stimulation.  SUMMARY AND CONCLUSION:  The results of electrophysiological testing confirmed substrate for typical atrial flutter.  Catheter ablation across the cavotricuspid isthmus resulted in bidirectional isthmus conduction block thereby eliminating the patient's substrate.  The patient will be continued on Coumadin because of his history of DVT and pulmonary embolism.  The patient's pacemaker was interrogated and confirmed to be in the appropriate mode.     Duke Salvia, MD, [facility]     SCK/MEDQ  D:  02/08/2011  T:  02/08/2011  Job:  [id number]  Electronically Signed by Sherryl Manges MD [facility] on 02/13/2011 01:54:40 PM

## 2011-03-03 ENCOUNTER — Telehealth: Payer: Self-pay | Admitting: Internal Medicine

## 2011-03-03 NOTE — Telephone Encounter (Signed)
Pt calling with questions regarding mild pain/discomfort associated with ablation. Pt wants to know if this expected. Pt not c/o of any other negative symptoms. Please return pt call to discuss further.

## 2011-03-03 NOTE — Telephone Encounter (Signed)
I spoke with the patient. He states that he has some very mild intermittent chest discomfort that he states he mostly notices at night after he's been in one position most of the day. He has noticed this since his ablation, but he was having some discomfort the his shoulders as well, which has resolved. I explained to him that since his discomfort is intermittent and mild, I would review with Dr. Graciela Husbands to see if he wants to pursue an echo or not. I will call the patient back next week. He is agreeable and verbalizes understanding.

## 2011-03-03 NOTE — Telephone Encounter (Signed)
I attempted to call the patient, but was told by his staff that he is in a mediation now. I have left a message for him to call me back. Per Dr. Ladona Ridgel, if he is having chest pain this far out from ablation, that he should have an echo done and f/u with Dr. Graciela Husbands.

## 2011-03-06 NOTE — Telephone Encounter (Signed)
H  i can not find a stress or cath;  He had a neg CTA in 2009 with Ca score of 0  i think an echo makes sense, and t hen a trial of PPI rx for 4 weeks sthnaks steve

## 2011-03-07 NOTE — Telephone Encounter (Signed)
I spoke with the patient and made him aware of Dr. Odessa Fleming recommendations. He states that he feels like his symptoms are getting better. He states that they are very minimal to none. He would like to observe them for another week prior to obtaining and echo. He will call us back next week and let us know how he is doing.

## 2011-03-21 ENCOUNTER — Encounter: Payer: Medicare Other | Admitting: Internal Medicine

## 2011-03-22 LAB — PROTIME-INR
INR: 1.2
INR: 1.2
INR: 1.2
INR: 1.5
Prothrombin Time: 15.2
Prothrombin Time: 16.5 — ABNORMAL HIGH
Prothrombin Time: 18.6 — ABNORMAL HIGH

## 2011-03-22 LAB — COMPREHENSIVE METABOLIC PANEL
AST: 19
CO2: 29
Calcium: 8.9
Creatinine, Ser: 0.91
GFR calc Af Amer: >60
GFR calc non Af Amer: >60

## 2011-03-22 LAB — CBC
HCT: 36.8 — ABNORMAL LOW
HCT: 37.3 — ABNORMAL LOW
HCT: 37.5 — ABNORMAL LOW
Hemoglobin: 12.3 — ABNORMAL LOW
Hemoglobin: 12.4 — ABNORMAL LOW
Hemoglobin: 12.4 — ABNORMAL LOW
Hemoglobin: 12.6 — ABNORMAL LOW
Hemoglobin: 13.2
MCHC: 33.1
MCHC: 33.3
MCV: 85
MCV: 85
MCV: 85.3
Platelets: 144 — ABNORMAL LOW
Platelets: 158
RBC: 4.33
RBC: 4.42
RBC: 4.43
RDW: 14.7
RDW: 14.9
RDW: 15.1
WBC: 5.8
WBC: 7.3
WBC: 7.3

## 2011-03-22 LAB — BASIC METABOLIC PANEL
BUN: 13
BUN: 15
CO2: 27
CO2: 28
Calcium: 9
Calcium: 9.7
Chloride: 108
Chloride: 109
Creatinine, Ser: 1.02
Creatinine, Ser: 1.08
GFR calc Af Amer: >60
GFR calc Af Amer: >60
GFR calc Af Amer: >60
GFR calc non Af Amer: >60
GFR calc non Af Amer: >60
GFR calc non Af Amer: >60
GFR calc non Af Amer: >60
Glucose, Bld: 137 — ABNORMAL HIGH
Glucose, Bld: 97
Glucose, Bld: 98
Potassium: 3.7
Potassium: 3.8
Potassium: 4
Potassium: 4.3
Sodium: 137
Sodium: 140
Sodium: 141
Sodium: 143

## 2011-03-22 LAB — D-DIMER, QUANTITATIVE: D-Dimer, Quant: 3.7 — ABNORMAL HIGH

## 2011-03-22 LAB — DIFFERENTIAL
Basophils Absolute: 0
Basophils Absolute: 0
Basophils Absolute: 0.1
Basophils Relative: 0
Basophils Relative: 1
Basophils Relative: 1
Eosinophils Absolute: 0.3
Eosinophils Absolute: 0.3
Eosinophils Relative: 6 — ABNORMAL HIGH
Eosinophils Relative: 6 — ABNORMAL HIGH
Eosinophils Relative: 6 — ABNORMAL HIGH
Lymphocytes Relative: 34
Lymphocytes Relative: 43
Lymphocytes Relative: 44
Lymphs Abs: 2.2
Lymphs Abs: 2.5
Monocytes Absolute: 0.5
Monocytes Absolute: 0.6
Monocytes Absolute: 0.6
Monocytes Absolute: 0.8
Monocytes Absolute: 0.9
Monocytes Relative: 10
Monocytes Relative: 11
Monocytes Relative: 11
Monocytes Relative: 9
Neutro Abs: 2.5
Neutro Abs: 3.6
Neutrophils Relative %: 46
Neutrophils Relative %: 49
Neutrophils Relative %: 58

## 2011-03-22 LAB — POCT I-STAT, CHEM 8
BUN: 17
Calcium, Ion: 1.25
Chloride: 105
Creatinine, Ser: 1.2
Glucose, Bld: 131 — ABNORMAL HIGH

## 2011-03-22 LAB — HEPARIN LEVEL (UNFRACTIONATED)
Heparin Unfractionated: 0.24 — ABNORMAL LOW
Heparin Unfractionated: 0.27 — ABNORMAL LOW
Heparin Unfractionated: 0.66

## 2011-03-22 LAB — FACTOR 5 LEIDEN

## 2011-03-22 LAB — ANTIPHOSPHOLIPID SYNDROME EVAL, BLD
Anticardiolipin IgG: 7 — ABNORMAL LOW (ref <11)
Anticardiolipin IgM: <7 — ABNORMAL LOW (ref <10)
Antiphosphatidylserine IgA: <20 APS U/mL (ref <20.0)
Antiphosphatidylserine IgG: <11 GPS U/mL (ref <11.0)
Antiphosphatidylserine IgM: <25 MPS U/mL (ref <25.0)
PTTLA 4:1 Mix: 98 — ABNORMAL HIGH (ref 36.3–48.8)

## 2011-03-22 LAB — PROTEIN S ACTIVITY: Protein S Activity: 56 % — ABNORMAL LOW (ref 69–129)

## 2011-03-22 LAB — OCCULT BLOOD X 1 CARD TO LAB, STOOL: Fecal Occult Bld: NEGATIVE

## 2011-03-22 LAB — POCT CARDIAC MARKERS
Myoglobin, poc: 82.1
Troponin i, poc: <0.05

## 2011-03-22 LAB — ANTITHROMBIN III: AntiThromb III Func: 81

## 2011-03-31 ENCOUNTER — Encounter: Payer: Self-pay | Admitting: Internal Medicine

## 2011-04-04 LAB — URINE MICROSCOPIC-ADD ON

## 2011-04-04 LAB — URINALYSIS, ROUTINE W REFLEX MICROSCOPIC
Ketones, ur: NEGATIVE
Protein, ur: 100 — AB
Urobilinogen, UA: 0.2

## 2011-04-04 LAB — CBC
MCHC: 33.5
MCV: 81.8
RDW: 13.6

## 2011-04-04 LAB — BASIC METABOLIC PANEL
BUN: 14
BUN: 9
CO2: 28
Calcium: 8.4
Calcium: 9.4
Chloride: 106
Creatinine, Ser: 0.93
Creatinine, Ser: 0.94
GFR calc non Af Amer: >60
Glucose, Bld: 110 — ABNORMAL HIGH
Glucose, Bld: 93
Sodium: 142

## 2011-04-04 LAB — URINE CULTURE
Colony Count: NO GROWTH
Culture: NO GROWTH

## 2011-04-06 ENCOUNTER — Telehealth: Payer: Self-pay | Admitting: Internal Medicine

## 2011-04-06 NOTE — Telephone Encounter (Signed)
Pt calling wanting to know if pt needs to come in to see Dr. Graciela Husbands. Pt missed appt w/ Graciela Husbands on 10/2. Pt said he didn't know why he needed that appt and wants to know if it is recommended pt rs appt. Please return pt call to discuss further.

## 2011-04-06 NOTE — Telephone Encounter (Signed)
LMTC

## 2011-04-06 NOTE — Telephone Encounter (Signed)
I spoke with the patient. He will see Dr. Graciela Husbands on 04/11/11.

## 2011-04-11 ENCOUNTER — Encounter: Payer: Self-pay | Admitting: Internal Medicine

## 2011-04-11 ENCOUNTER — Ambulatory Visit (INDEPENDENT_AMBULATORY_CARE_PROVIDER_SITE_OTHER): Payer: Medicare Other | Admitting: Internal Medicine

## 2011-04-11 DIAGNOSIS — I4892 Unspecified atrial flutter: Secondary | ICD-10-CM

## 2011-04-11 DIAGNOSIS — Z950 Presence of cardiac pacemaker: Secondary | ICD-10-CM

## 2011-04-11 DIAGNOSIS — I428 Other cardiomyopathies: Secondary | ICD-10-CM

## 2011-04-11 DIAGNOSIS — I495 Sick sinus syndrome: Secondary | ICD-10-CM

## 2011-04-11 LAB — PACEMAKER DEVICE OBSERVATION
AL THRESHOLD: 0.5 V
BATTERY VOLTAGE: 2.75 V
RV LEAD AMPLITUDE: 5.6 mv
RV LEAD IMPEDENCE PM: 617 Ohm
VENTRICULAR PACING PM: 0

## 2011-04-11 MED ORDER — CARVEDILOL PHOSPHATE ER 40 MG PO CP24: 40.0000 mg | ORAL_CAPSULE | Freq: Every day | ORAL | Status: DC

## 2011-04-11 NOTE — Assessment & Plan Note (Signed)
As I dictate his carvedilol once a day. We'll see if we given a prescription for long-acting formulation

## 2011-04-11 NOTE — Assessment & Plan Note (Signed)
The patient's device was interrogated.  The information was reviewed. No changes were made in the programming.    

## 2011-04-11 NOTE — Assessment & Plan Note (Signed)
Status post ablation without evidence of recurrent

## 2011-04-11 NOTE — Progress Notes (Signed)
  HPI  Steven Macdonald is a 76 y.o. male Seen in followup for AFlutter s/p RFCA   He has had no recurrent arrhythmia.  He wonders whether he can take his Coreg once in the morning because it seems to have a negative impact on sexual activity; he is recently married. He denies exercise intolerance  Past Medical History  Diagnosis Date  . HLD (hyperlipidemia)   . HTN (hypertension)   . Pulmonary embolism   . Cardiomyopathy, nonischemic     Echo 2008: EF 25% Cardiac CT 2009: Normal cors EF 31% Echo 3/10 EF 45% Echo 3/12: EF 20-25% moderate MR (in setting of AFL)  . LBBB (left bundle branch block)   . Bradycardia     s/p PPM  . ED (erectile dysfunction)   . Hemorrhoids   . Atrial flutter     Onset 3/12    Past Surgical History  Procedure Date  . Pacemaker insertion   . Cystoscopy w/ transurethral resection of posterior uretheral valves     Current Outpatient Prescriptions  Medication Sig Dispense Refill  . cholecalciferol (VITAMIN D) 1000 UNITS tablet Take 1,000 Units by mouth daily.        Marland Kitchen COREG 25 MG tablet TAKE 1 TABLET TWICE      DAILY.  60 each  6  . fish oil-omega-3 fatty acids 1000 MG capsule 1 tab po qd      . lisinopril (PRINIVIL,ZESTRIL) 10 MG tablet Take 1 tablet (10 mg total) by mouth 2 (two) times daily.  60 tablet  6  . Psyllium (METAMUCIL PO) 1 tablet daily.       . sildenafil (VIAGRA) 100 MG tablet Take 100 mg by mouth daily as needed.        . simvastatin (ZOCOR) 40 MG tablet Take 40 mg by mouth. 1/2 tb po bid       . warfarin (COUMADIN) 5 MG tablet Take 5 mg by mouth daily.          No Known Allergies  Review of Systems negative except from HPI and PMH  Physical Exam Well developed and well nourished in no acute distress HENT normal E scleral and icterus clear Neck Supple JVP flat; carotids brisk and full Clear to ausculation Regular rate and rhythm, no murmurs gallops or rub Soft with active bowel sounds No clubbing cyanosis and edema Alert and  oriented, grossly normal motor and sensory function Skin Warm and Dry  ECG atrial pacing with intrinsic conduction left bundle branch block  Assessment and  Plan

## 2011-04-11 NOTE — Patient Instructions (Signed)
Your physician wants you to follow-up in: 3-4 months with Dr. Gala Romney & 1 year with Dr. Graciela Husbands. You will receive a reminder letter in the mail two months in advance. If you don't receive a letter, please call our office to schedule the follow-up appointment.  Your physician has recommended you make the following change in your medication:  1) Stop carvedilol twice daily. 2) Start Coreg CR 40mg  once daily.

## 2011-05-08 ENCOUNTER — Telehealth: Payer: Self-pay | Admitting: *Deleted

## 2011-05-08 DIAGNOSIS — I4891 Unspecified atrial fibrillation: Secondary | ICD-10-CM

## 2011-05-08 MED ORDER — CARVEDILOL 25 MG PO TABS: 25.0000 mg | ORAL_TABLET | Freq: Two times a day (BID) | ORAL | Status: DC

## 2011-05-08 NOTE — Telephone Encounter (Signed)
Fax received from [facility] stating the patient is wanting to switch back to twice daily carvedilol from the Coreg CR. RX sent for Carvedilol 25mg  twice daily to [facility]. I left a message on the voice mail at [facility] that this has been done electronically.

## 2011-07-13 ENCOUNTER — Encounter: Payer: Medicare Other | Admitting: *Deleted

## 2011-07-17 ENCOUNTER — Encounter: Payer: Self-pay | Admitting: *Deleted

## 2011-07-19 DIAGNOSIS — I2699 Other pulmonary embolism without acute cor pulmonale: Secondary | ICD-10-CM | POA: Diagnosis not present

## 2011-07-19 DIAGNOSIS — I82409 Acute embolism and thrombosis of unspecified deep veins of unspecified lower extremity: Secondary | ICD-10-CM | POA: Diagnosis not present

## 2011-07-19 DIAGNOSIS — Z7901 Long term (current) use of anticoagulants: Secondary | ICD-10-CM | POA: Diagnosis not present

## 2011-07-28 DIAGNOSIS — I2699 Other pulmonary embolism without acute cor pulmonale: Secondary | ICD-10-CM | POA: Diagnosis not present

## 2011-07-28 DIAGNOSIS — R040 Epistaxis: Secondary | ICD-10-CM | POA: Diagnosis not present

## 2011-08-08 ENCOUNTER — Other Ambulatory Visit: Payer: Self-pay | Admitting: Internal Medicine

## 2011-08-08 ENCOUNTER — Other Ambulatory Visit: Payer: Self-pay | Admitting: *Deleted

## 2011-08-08 MED ORDER — LISINOPRIL 10 MG PO TABS: 10.0000 mg | ORAL_TABLET | Freq: Two times a day (BID) | ORAL | Status: DC

## 2011-08-14 ENCOUNTER — Encounter: Payer: Self-pay | Admitting: Internal Medicine

## 2011-08-14 ENCOUNTER — Ambulatory Visit (INDEPENDENT_AMBULATORY_CARE_PROVIDER_SITE_OTHER): Payer: Medicare Other | Admitting: *Deleted

## 2011-08-14 DIAGNOSIS — I495 Sick sinus syndrome: Secondary | ICD-10-CM

## 2011-08-14 DIAGNOSIS — I428 Other cardiomyopathies: Secondary | ICD-10-CM

## 2011-08-14 DIAGNOSIS — I4892 Unspecified atrial flutter: Secondary | ICD-10-CM

## 2011-08-18 LAB — REMOTE PACEMAKER DEVICE
AL THRESHOLD: 0.5 V
BAMS-0001: 175 {beats}/min
BATTERY VOLTAGE: 2.75 V
RV LEAD THRESHOLD: 0.625 V

## 2011-08-24 DIAGNOSIS — I2699 Other pulmonary embolism without acute cor pulmonale: Secondary | ICD-10-CM | POA: Diagnosis not present

## 2011-08-24 DIAGNOSIS — I82409 Acute embolism and thrombosis of unspecified deep veins of unspecified lower extremity: Secondary | ICD-10-CM | POA: Diagnosis not present

## 2011-08-24 DIAGNOSIS — Z7901 Long term (current) use of anticoagulants: Secondary | ICD-10-CM | POA: Diagnosis not present

## 2011-08-29 ENCOUNTER — Encounter: Payer: Self-pay | Admitting: *Deleted

## 2011-09-08 NOTE — Progress Notes (Signed)
Remote pacer check  

## 2011-09-25 ENCOUNTER — Encounter: Payer: Self-pay | Admitting: Internal Medicine

## 2011-09-26 DIAGNOSIS — I82409 Acute embolism and thrombosis of unspecified deep veins of unspecified lower extremity: Secondary | ICD-10-CM | POA: Diagnosis not present

## 2011-09-26 DIAGNOSIS — I2699 Other pulmonary embolism without acute cor pulmonale: Secondary | ICD-10-CM | POA: Diagnosis not present

## 2011-09-26 DIAGNOSIS — Z7901 Long term (current) use of anticoagulants: Secondary | ICD-10-CM | POA: Diagnosis not present

## 2011-10-16 ENCOUNTER — Telehealth: Payer: Self-pay | Admitting: Internal Medicine

## 2011-10-16 NOTE — Telephone Encounter (Signed)
Pt is leaving to go out of the country and requests to be seen sooner than scheduled appt. Pt scheduled to see Mike Gip PA tomorrow at 3:30pm. Pt aware of appt date and time.

## 2011-10-17 ENCOUNTER — Ambulatory Visit (INDEPENDENT_AMBULATORY_CARE_PROVIDER_SITE_OTHER): Payer: Medicare Other | Admitting: Physician Assistant

## 2011-10-17 ENCOUNTER — Encounter: Payer: Self-pay | Admitting: Physician Assistant

## 2011-10-17 ENCOUNTER — Other Ambulatory Visit (INDEPENDENT_AMBULATORY_CARE_PROVIDER_SITE_OTHER): Payer: Medicare Other

## 2011-10-17 VITALS — BP 110/62 | HR 60 | Ht 75.0 in | Wt 186.6 lb

## 2011-10-17 DIAGNOSIS — R194 Change in bowel habit: Secondary | ICD-10-CM

## 2011-10-17 DIAGNOSIS — R198 Other specified symptoms and signs involving the digestive system and abdomen: Secondary | ICD-10-CM | POA: Diagnosis not present

## 2011-10-17 LAB — CBC WITH DIFFERENTIAL/PLATELET
Basophils Absolute: 0 10*3/uL (ref 0.0–0.1)
Eosinophils Relative: 5.3 % — ABNORMAL HIGH (ref 0.0–5.0)
Lymphocytes Relative: 43.8 % (ref 12.0–46.0)
Monocytes Relative: 8.7 % (ref 3.0–12.0)
Neutrophils Relative %: 41.7 % — ABNORMAL LOW (ref 43.0–77.0)
Platelets: 113 10*3/uL — ABNORMAL LOW (ref 150.0–400.0)
RDW: 14.9 % — ABNORMAL HIGH (ref 11.5–14.6)
WBC: 6.8 10*3/uL (ref 4.5–10.5)

## 2011-10-17 LAB — SEDIMENTATION RATE: Sed Rate: 10 mm/hr (ref 0–22)

## 2011-10-17 MED ORDER — GLYCOPYRROLATE 2 MG PO TABS: ORAL_TABLET | ORAL | Status: DC

## 2011-10-17 NOTE — Patient Instructions (Signed)
Your physician has requested that you go to the basement for lab work before leaving today  We have sent the following medications to your pharmacy for you to pick up at your convenience:  Robinul  You may take Imodium as needed for diarrhea.

## 2011-10-17 NOTE — Progress Notes (Signed)
Subjective:    Patient ID: Steven Macdonald, male    DOB: 1935-02-10, 77 y.o.   MRN: [id number]  HPI Steven Macdonald is a 77 year old white male known to Dr. Marina Macdonald with history of ischemic cardiomyopathy with ejection fraction 20-25%, he is status post pacemaker placement has history of congestive heart failure, tachybradycardia syndrome and previous PE. He is maintained on chronic Coumadin. He has been seen over the years for IBS and colon polyps. His last colonoscopy was done in March of 2010 1 diminutive rectal polyp found, this was adenomatous and 5 year followup was recommended. Appendectomy has had problems in the past with both constipation and some difficulty with intermittent fecal incontinence. He had been placed on Metamucil by Dr. Marina Macdonald.  He says now over the past 3-4 months he has been noticing increased frequency of bowel movements and has been having up to 4 bowel movements per day he says he also has occasional bouts of diarrhea with 5-6 bowel movements per day. He says mostly his stools are normal he is just going more frequently. He has no complaints of abdominal pain or discomfort no cramping no melena or hematochezia. He said he took himself off of Metamucil over-the-counter 2 weeks and says now he is generally having 2 bowel movements per day. He had also spoken to Dr. Waynard Macdonald  and had taken a course of probiotics which did not seem to change anything. Appetite has been fine his weight has been stable, he has not had any recent antibiotics or any changes in his medication. He says he got married about a year ago and has been using Viagra on a very regular basis.  He is concerned today because of the change in his bowel habits and also because he has an upcoming trip planned to [place].    Review of Systems  Constitutional: Negative.   HENT: Negative.   Eyes: Negative.   Respiratory: Negative.   Cardiovascular: Negative.   Gastrointestinal: Positive for diarrhea.  Genitourinary: Negative.     Musculoskeletal: Negative.   Neurological: Negative.   Hematological: Negative.   Psychiatric/Behavioral: Negative.    Outpatient Prescriptions Prior to Visit  Medication Sig Dispense Refill  . carvedilol (COREG) 25 MG tablet Take 1 tablet (25 mg total) by mouth 2 (two) times daily with a meal.  60 tablet  11  . fish oil-omega-3 fatty acids 1000 MG capsule 1 tab po qd      . lisinopril (PRINIVIL,ZESTRIL) 10 MG tablet Take 1 tablet (10 mg total) by mouth 2 (two) times daily.  60 tablet  6  . sildenafil (VIAGRA) 100 MG tablet Take 100 mg by mouth daily as needed.        . simvastatin (ZOCOR) 40 MG tablet Take 40 mg by mouth. 1/2 tb po bid       . warfarin (COUMADIN) 5 MG tablet Take 5 mg by mouth daily.        . cholecalciferol (VITAMIN D) 1000 UNITS tablet Take 1,000 Units by mouth daily.        . Psyllium (METAMUCIL PO) 1 tablet daily.           No Known Allergies Patient Active Problem List  Diagnoses  . HYPERLIPIDEMIA  . HYPERTENSION, BENIGN  . PE  . CARDIOMYOPATHY  . LBBB  . SICK SINUS/ TACHY-BRADY SYNDROME  . CHF  . DVT  . HEMORRHOIDS  . SYNCOPE  . FECAL INCONTINENCE  . PERSONAL HISTORY OF COLONIC POLYPS  . Atrial flutter  .  Pacemaker    Objective:   Physical Exam well-developed thin older white male in no acute distress, pleasant blood pressure 110/62, pulse 60. HEENT, EOMI, PERRLA sclera anicteric, Neck;supple,t no JVD, Cardiovascular; regular rate and rhythm with S1 and S2 soft systolic murmur, Pulmonary; clear bilaterally, Abdomen; soft ,nondistended, bowel sounds are present no palpable mass or hepatosplenomegaly, Rectal; not done, Extremities; no clubbing cyanosis or edema skin warm dry, Psych; mood and affect normal and appropriate.        Assessment & Plan:  #60 77 year old male with history of colon polyps, intermittent fecal incontinence and now presenting with a change in his bowel habits which has been subtle over the past 3-4 months. No known drug  triggerrs changes in medications etc., though Viagra does list diarrhea as a side effect. Etiology of change in bowel habits is not clear, I suspect functional. Plan; check Hemoccults,cbc,sed rate Add Robinul Forte 2 mg once daily in a.m. Trial of Imodium one -2 ,when necessary for episodes of diarrhea. Patient has a followup appointment with Dr. Marina Macdonald in a couple of weeks and  will keep this

## 2011-10-18 NOTE — Progress Notes (Signed)
i agree with the plan outlined in this note 

## 2011-10-19 DIAGNOSIS — J90 Pleural effusion, not elsewhere classified: Secondary | ICD-10-CM | POA: Diagnosis not present

## 2011-10-19 DIAGNOSIS — R079 Chest pain, unspecified: Secondary | ICD-10-CM | POA: Diagnosis not present

## 2011-10-20 ENCOUNTER — Other Ambulatory Visit: Payer: Medicare Other

## 2011-10-20 DIAGNOSIS — R194 Change in bowel habit: Secondary | ICD-10-CM

## 2011-10-20 DIAGNOSIS — I82409 Acute embolism and thrombosis of unspecified deep veins of unspecified lower extremity: Secondary | ICD-10-CM | POA: Diagnosis not present

## 2011-10-20 LAB — HEMOCCULT SLIDES (X 3 CARDS)
Fecal Occult Blood: NEGATIVE
OCCULT 2: NEGATIVE
OCCULT 3: NEGATIVE
OCCULT 4: NEGATIVE
OCCULT 5: NEGATIVE

## 2011-10-23 ENCOUNTER — Telehealth: Payer: Self-pay | Admitting: *Deleted

## 2011-10-23 NOTE — Telephone Encounter (Signed)
Patient saw Mike Gip, PA for OV on 10/17/11 for diarrhea/increased bowel movements. Patient had been scheduled to see Dr. Marina Goodell on 10/26/11 at 10:15 AM. When he was seen by Mike Gip, PA , this appointment was cancelled. Mike Gip, PA and patient thought he was still scheduled for this appointment. Called patient the negative hem card results. He was upset that this appointment had been cancelled since he is going out of the country Friday and wanted to see Dr. Marina Goodell. There are no appointments available at this time for patient. Please, advise.

## 2011-10-23 NOTE — Telephone Encounter (Signed)
Patient aware of appointment

## 2011-10-23 NOTE — Telephone Encounter (Signed)
He can see me on the 9th at 9am as an overbooked appointment

## 2011-10-26 ENCOUNTER — Encounter: Payer: Self-pay | Admitting: Internal Medicine

## 2011-10-26 ENCOUNTER — Ambulatory Visit (INDEPENDENT_AMBULATORY_CARE_PROVIDER_SITE_OTHER): Payer: Medicare Other | Admitting: Internal Medicine

## 2011-10-26 ENCOUNTER — Ambulatory Visit: Payer: Medicare Other | Admitting: Internal Medicine

## 2011-10-26 VITALS — BP 90/58 | HR 68 | Ht 75.0 in | Wt 186.4 lb

## 2011-10-26 DIAGNOSIS — R197 Diarrhea, unspecified: Secondary | ICD-10-CM | POA: Diagnosis not present

## 2011-10-26 DIAGNOSIS — K589 Irritable bowel syndrome without diarrhea: Secondary | ICD-10-CM

## 2011-10-26 DIAGNOSIS — R198 Other specified symptoms and signs involving the digestive system and abdomen: Secondary | ICD-10-CM | POA: Diagnosis not present

## 2011-10-26 NOTE — Progress Notes (Signed)
HISTORY OF PRESENT ILLNESS:  Steven Macdonald is a 77 y.o. male with multiple medical problems as listed below. He presents today for followup.CBC is unremarkable except for stable chronic thrombocytopenia. Sedimentation rate was normal. Hemoccult studies negative. He was prescribed Robinul, but has not tried this. As well, Imodium suggested. Has not needed this. His bowels have non-specifically improved. He is anticipating a trip to [place]. He wanted to discuss his bowel issues further. He was concerned about possible cancer  REVIEW OF SYSTEMS:  All non-GI ROS negative except   Past Medical History  Diagnosis Date  . HLD (hyperlipidemia)   . HTN (hypertension)   . Pulmonary embolism   . Cardiomyopathy, nonischemic     Echo 2008: EF 25% Cardiac CT 2009: Normal cors EF 31% Echo 3/10 EF 45% Echo 3/12: EF 20-25% moderate MR (in setting of AFL)  . LBBB (left bundle branch block)   . Bradycardia     s/p PPM  . ED (erectile dysfunction)   . Hemorrhoids   . Atrial flutter     Onset 3/12  . Arthritis     Past Surgical History  Procedure Date  . Pacemaker insertion   . Cystoscopy w/ transurethral resection of posterior uretheral valves     Social History Steven Macdonald  reports that he quit smoking about 41 years ago. He has never used smokeless tobacco. He reports that he drinks alcohol. He reports that he does not use illicit drugs.  family history includes Breast cancer in his mother; Colon cancer in his father; and Colon polyps in his father.  No Known Allergies     PHYSICAL EXAMINATION: Vital signs: BP 90/58  Pulse 68  Ht 6\' 3"  (1.905 m)  Wt 186 lb 6.4 oz (84.55 kg)  BMI 23.30 kg/m2 General: Well-developed, well-nourished, no acute distress Abdomen: not reexamined Psychiatric: alert and oriented x3. Cooperative    ASSESSMENT:  #1. History of IBS with recent increased frequency of bowel movements. Nonspecifically improved. No worrisome features #2. History of adenomatous  colon polyps. Last colonoscopy March 2010 #3. Multiple medical problems   PLAN:  #1. Recommended to use Imodium as needed. #2. May use Robinul if he develops abdominal cramping urgency #3. Surveillance colonoscopy around March 2010. Interval followup as needed

## 2011-10-26 NOTE — Patient Instructions (Signed)
Please follow up as needed 

## 2011-11-16 ENCOUNTER — Encounter: Payer: Medicare Other | Admitting: *Deleted

## 2011-11-20 DIAGNOSIS — E119 Type 2 diabetes mellitus without complications: Secondary | ICD-10-CM | POA: Diagnosis not present

## 2011-11-20 DIAGNOSIS — E785 Hyperlipidemia, unspecified: Secondary | ICD-10-CM | POA: Diagnosis not present

## 2011-11-20 DIAGNOSIS — Z7901 Long term (current) use of anticoagulants: Secondary | ICD-10-CM | POA: Diagnosis not present

## 2011-11-20 DIAGNOSIS — I10 Essential (primary) hypertension: Secondary | ICD-10-CM | POA: Diagnosis not present

## 2011-11-20 DIAGNOSIS — I82409 Acute embolism and thrombosis of unspecified deep veins of unspecified lower extremity: Secondary | ICD-10-CM | POA: Diagnosis not present

## 2011-11-20 DIAGNOSIS — Z125 Encounter for screening for malignant neoplasm of prostate: Secondary | ICD-10-CM | POA: Diagnosis not present

## 2011-11-22 ENCOUNTER — Encounter: Payer: Self-pay | Admitting: *Deleted

## 2011-11-27 DIAGNOSIS — Z0000 Encounter for general adult medical examination without abnormal findings: Secondary | ICD-10-CM | POA: Diagnosis not present

## 2011-11-27 DIAGNOSIS — I10 Essential (primary) hypertension: Secondary | ICD-10-CM | POA: Diagnosis not present

## 2011-11-27 DIAGNOSIS — Z125 Encounter for screening for malignant neoplasm of prostate: Secondary | ICD-10-CM | POA: Diagnosis not present

## 2011-11-27 DIAGNOSIS — I509 Heart failure, unspecified: Secondary | ICD-10-CM | POA: Diagnosis not present

## 2011-11-27 DIAGNOSIS — E119 Type 2 diabetes mellitus without complications: Secondary | ICD-10-CM | POA: Diagnosis not present

## 2011-12-05 ENCOUNTER — Encounter (HOSPITAL_COMMUNITY): Payer: Self-pay

## 2011-12-19 ENCOUNTER — Encounter: Payer: Self-pay | Admitting: Internal Medicine

## 2011-12-19 ENCOUNTER — Ambulatory Visit (INDEPENDENT_AMBULATORY_CARE_PROVIDER_SITE_OTHER): Payer: Medicare Other | Admitting: *Deleted

## 2011-12-19 DIAGNOSIS — I495 Sick sinus syndrome: Secondary | ICD-10-CM | POA: Diagnosis not present

## 2011-12-20 DIAGNOSIS — Z7901 Long term (current) use of anticoagulants: Secondary | ICD-10-CM | POA: Diagnosis not present

## 2011-12-20 DIAGNOSIS — I2699 Other pulmonary embolism without acute cor pulmonale: Secondary | ICD-10-CM | POA: Diagnosis not present

## 2011-12-20 DIAGNOSIS — I82409 Acute embolism and thrombosis of unspecified deep veins of unspecified lower extremity: Secondary | ICD-10-CM | POA: Diagnosis not present

## 2011-12-22 LAB — REMOTE PACEMAKER DEVICE
AL IMPEDENCE PM: 531 Ohm
AL THRESHOLD: 0.625 V
BATTERY VOLTAGE: 2.74 V
RV LEAD AMPLITUDE: 16 mv
VENTRICULAR PACING PM: 0

## 2012-01-04 DIAGNOSIS — M2040 Other hammer toe(s) (acquired), unspecified foot: Secondary | ICD-10-CM | POA: Diagnosis not present

## 2012-01-04 DIAGNOSIS — Q6689 Other  specified congenital deformities of feet: Secondary | ICD-10-CM | POA: Diagnosis not present

## 2012-01-12 ENCOUNTER — Encounter: Payer: Self-pay | Admitting: *Deleted

## 2012-01-30 DIAGNOSIS — I82409 Acute embolism and thrombosis of unspecified deep veins of unspecified lower extremity: Secondary | ICD-10-CM | POA: Diagnosis not present

## 2012-01-30 DIAGNOSIS — Z7901 Long term (current) use of anticoagulants: Secondary | ICD-10-CM | POA: Diagnosis not present

## 2012-01-30 DIAGNOSIS — I2699 Other pulmonary embolism without acute cor pulmonale: Secondary | ICD-10-CM | POA: Diagnosis not present

## 2012-02-26 DIAGNOSIS — H2510 Age-related nuclear cataract, unspecified eye: Secondary | ICD-10-CM | POA: Diagnosis not present

## 2012-03-04 ENCOUNTER — Other Ambulatory Visit: Payer: Self-pay | Admitting: Internal Medicine

## 2012-03-04 MED ORDER — LISINOPRIL 10 MG PO TABS: 10.0000 mg | ORAL_TABLET | Freq: Two times a day (BID) | ORAL | Status: DC

## 2012-03-06 DIAGNOSIS — S92309A Fracture of unspecified metatarsal bone(s), unspecified foot, initial encounter for closed fracture: Secondary | ICD-10-CM | POA: Diagnosis not present

## 2012-03-19 DIAGNOSIS — I82409 Acute embolism and thrombosis of unspecified deep veins of unspecified lower extremity: Secondary | ICD-10-CM | POA: Diagnosis not present

## 2012-03-19 DIAGNOSIS — Z23 Encounter for immunization: Secondary | ICD-10-CM | POA: Diagnosis not present

## 2012-03-19 DIAGNOSIS — Z7901 Long term (current) use of anticoagulants: Secondary | ICD-10-CM | POA: Diagnosis not present

## 2012-03-19 DIAGNOSIS — I2699 Other pulmonary embolism without acute cor pulmonale: Secondary | ICD-10-CM | POA: Diagnosis not present

## 2012-03-19 DIAGNOSIS — R55 Syncope and collapse: Secondary | ICD-10-CM

## 2012-03-27 DIAGNOSIS — S92309A Fracture of unspecified metatarsal bone(s), unspecified foot, initial encounter for closed fracture: Secondary | ICD-10-CM | POA: Diagnosis not present

## 2012-03-28 ENCOUNTER — Other Ambulatory Visit: Payer: Self-pay

## 2012-03-28 DIAGNOSIS — Z85828 Personal history of other malignant neoplasm of skin: Secondary | ICD-10-CM | POA: Diagnosis not present

## 2012-03-28 DIAGNOSIS — L821 Other seborrheic keratosis: Secondary | ICD-10-CM | POA: Diagnosis not present

## 2012-03-28 DIAGNOSIS — D485 Neoplasm of uncertain behavior of skin: Secondary | ICD-10-CM | POA: Diagnosis not present

## 2012-03-28 DIAGNOSIS — L570 Actinic keratosis: Secondary | ICD-10-CM | POA: Diagnosis not present

## 2012-03-28 DIAGNOSIS — L723 Sebaceous cyst: Secondary | ICD-10-CM | POA: Diagnosis not present

## 2012-03-28 DIAGNOSIS — L819 Disorder of pigmentation, unspecified: Secondary | ICD-10-CM | POA: Diagnosis not present

## 2012-03-28 DIAGNOSIS — D239 Other benign neoplasm of skin, unspecified: Secondary | ICD-10-CM | POA: Diagnosis not present

## 2012-04-03 ENCOUNTER — Encounter: Payer: Self-pay | Admitting: *Deleted

## 2012-04-12 ENCOUNTER — Encounter: Payer: Self-pay | Admitting: Internal Medicine

## 2012-04-12 ENCOUNTER — Ambulatory Visit (INDEPENDENT_AMBULATORY_CARE_PROVIDER_SITE_OTHER): Payer: Medicare Other | Admitting: Internal Medicine

## 2012-04-12 VITALS — BP 91/68 | HR 67 | Ht 75.0 in | Wt 185.0 lb

## 2012-04-12 DIAGNOSIS — I428 Other cardiomyopathies: Secondary | ICD-10-CM | POA: Diagnosis not present

## 2012-04-12 DIAGNOSIS — I10 Essential (primary) hypertension: Secondary | ICD-10-CM | POA: Diagnosis not present

## 2012-04-12 DIAGNOSIS — I4891 Unspecified atrial fibrillation: Secondary | ICD-10-CM | POA: Diagnosis not present

## 2012-04-12 DIAGNOSIS — I495 Sick sinus syndrome: Secondary | ICD-10-CM | POA: Diagnosis not present

## 2012-04-12 DIAGNOSIS — N529 Male erectile dysfunction, unspecified: Secondary | ICD-10-CM | POA: Diagnosis not present

## 2012-04-12 DIAGNOSIS — Z950 Presence of cardiac pacemaker: Secondary | ICD-10-CM

## 2012-04-12 NOTE — Assessment & Plan Note (Signed)
The patient's device was interrogated.  The information was reviewed. No changes were made in the programming.    

## 2012-04-12 NOTE — Progress Notes (Signed)
Patient Care Team: Ezequiel Kayser, MD as PCP - General   HPI  Steven Macdonald is a 77 y.o. male Seen in followup for AFlutter s/p RFCA He has had no recurrent arrhythmia.   He has a previously implanted dual-chamber Medtronic pacemaker sinus node dysfunction  He continues to feel well without chest pain exercise intolerance or shortness of breath.   Past Medical History  Diagnosis Date  . HLD (hyperlipidemia)   . HTN (hypertension)   . Pulmonary embolism   . Cardiomyopathy, nonischemic     Echo 2008: EF 25% Cardiac CT 2009: Normal cors EF 31% Echo 3/10 EF 45% Echo 3/12: EF 20-25% moderate MR (in setting of AFL)  . LBBB (left bundle branch block)   . Bradycardia     s/p PPM  . ED (erectile dysfunction)   . Hemorrhoids   . Atrial flutter     Onset 3/12  . Arthritis     Past Surgical History  Procedure Date  . Pacemaker insertion   . Cystoscopy w/ transurethral resection of posterior uretheral valves     Current Outpatient Prescriptions  Medication Sig Dispense Refill  . carvedilol (COREG) 25 MG tablet Take 1 tablet (25 mg total) by mouth 2 (two) times daily with a meal.  60 tablet  11  . cholecalciferol (VITAMIN D) 1000 UNITS tablet Take 1,000 Units by mouth daily.      . fish oil-omega-3 fatty acids 1000 MG capsule 1 tab po qd      . glycopyrrolate (ROBINUL-FORTE) 2 MG tablet Take one tablet daily in the morning  30 tablet  3  . lisinopril (PRINIVIL,ZESTRIL) 10 MG tablet Take 1 tablet (10 mg total) by mouth 2 (two) times daily.  60 tablet  1  . sildenafil (VIAGRA) 100 MG tablet Take 100 mg by mouth daily as needed.        . simvastatin (ZOCOR) 40 MG tablet 1/2 tb po bid       . vitamin E (VITAMIN E) 1000 UNIT capsule Take 1,000 Units by mouth daily.      Marland Kitchen warfarin (COUMADIN) 5 MG tablet Take 1.5 tablets (7.5 mg) by mouth once daily. On Sundays, take only 1 tablet (5 mg)        No Known Allergies  Review of Systems negative except from HPI and PMH  Physical  Exam BP 91/68  Pulse 67  Ht 6\' 3"  (1.905 m)  Wt 185 lb (83.915 kg)  BMI 23.12 kg/m2 Well developed and well nourished in no acute distress HENT normal E scleral and icterus clear Neck Supple JVP flat; carotids brisk and full Clear to ausculation Device pocket well healed; without hematoma or erythema  reggular rate and rhythm, no murmurs gallops or rub Soft with active bowel sounds No clubbing cyanosis none Edema Alert and oriented, grossly normal motor and sensory function Skin Warm and Dry    Assessment and  Plan

## 2012-04-12 NOTE — Assessment & Plan Note (Signed)
Continue current medications. 

## 2012-04-12 NOTE — Assessment & Plan Note (Signed)
Stable on current meds 

## 2012-04-12 NOTE — Assessment & Plan Note (Signed)
100% atrial pacing. Heart rate excursion or a little bit right shifted, however, the patient's symptoms are real and we will leave in the way it is

## 2012-04-12 NOTE — Patient Instructions (Signed)
Your physician wants you to follow-up in: 6 MONTHS WITH DR KLEIN You will receive a reminder letter in the mail two months in advance. If you don't receive a letter, please call our office to schedule the follow-up appointment.  

## 2012-04-17 DIAGNOSIS — E119 Type 2 diabetes mellitus without complications: Secondary | ICD-10-CM | POA: Diagnosis not present

## 2012-04-17 DIAGNOSIS — K625 Hemorrhage of anus and rectum: Secondary | ICD-10-CM | POA: Diagnosis not present

## 2012-04-17 DIAGNOSIS — I2699 Other pulmonary embolism without acute cor pulmonale: Secondary | ICD-10-CM | POA: Diagnosis not present

## 2012-04-17 DIAGNOSIS — Z7901 Long term (current) use of anticoagulants: Secondary | ICD-10-CM | POA: Diagnosis not present

## 2012-04-19 LAB — PACEMAKER DEVICE OBSERVATION: BAMS-0001: 175 {beats}/min

## 2012-04-22 ENCOUNTER — Encounter: Payer: Self-pay | Admitting: Internal Medicine

## 2012-04-23 ENCOUNTER — Other Ambulatory Visit: Payer: Self-pay | Admitting: Dermatology

## 2012-04-23 DIAGNOSIS — D485 Neoplasm of uncertain behavior of skin: Secondary | ICD-10-CM | POA: Diagnosis not present

## 2012-04-26 ENCOUNTER — Other Ambulatory Visit: Payer: Self-pay | Admitting: *Deleted

## 2012-04-26 MED ORDER — LISINOPRIL 10 MG PO TABS: 10.0000 mg | ORAL_TABLET | Freq: Two times a day (BID) | ORAL | Status: DC

## 2012-05-14 ENCOUNTER — Other Ambulatory Visit: Payer: Self-pay | Admitting: *Deleted

## 2012-05-14 DIAGNOSIS — I4891 Unspecified atrial fibrillation: Secondary | ICD-10-CM

## 2012-05-14 MED ORDER — CARVEDILOL 25 MG PO TABS: 25.0000 mg | ORAL_TABLET | Freq: Two times a day (BID) | ORAL | Status: DC

## 2012-05-24 DIAGNOSIS — L723 Sebaceous cyst: Secondary | ICD-10-CM | POA: Diagnosis not present

## 2012-05-24 DIAGNOSIS — Z23 Encounter for immunization: Secondary | ICD-10-CM | POA: Diagnosis not present

## 2012-05-24 DIAGNOSIS — I10 Essential (primary) hypertension: Secondary | ICD-10-CM | POA: Diagnosis not present

## 2012-05-24 DIAGNOSIS — Z7901 Long term (current) use of anticoagulants: Secondary | ICD-10-CM | POA: Diagnosis not present

## 2012-05-24 DIAGNOSIS — E119 Type 2 diabetes mellitus without complications: Secondary | ICD-10-CM | POA: Diagnosis not present

## 2012-05-24 DIAGNOSIS — I509 Heart failure, unspecified: Secondary | ICD-10-CM | POA: Diagnosis not present

## 2012-06-04 ENCOUNTER — Ambulatory Visit (HOSPITAL_COMMUNITY)
Admission: RE | Admit: 2012-06-04 | Discharge: 2012-06-04 | Disposition: A | Discharge status: Home / Self Care | Payer: Medicare Other | Source: Ambulatory Visit | Attending: Internal Medicine | Admitting: Internal Medicine

## 2012-06-04 ENCOUNTER — Encounter (HOSPITAL_COMMUNITY): Payer: Self-pay

## 2012-06-04 VITALS — BP 122/86 | HR 64 | Ht 75.5 in | Wt 185.4 lb

## 2012-06-04 DIAGNOSIS — I428 Other cardiomyopathies: Secondary | ICD-10-CM | POA: Insufficient documentation

## 2012-06-04 DIAGNOSIS — Z85828 Personal history of other malignant neoplasm of skin: Secondary | ICD-10-CM | POA: Diagnosis not present

## 2012-06-04 DIAGNOSIS — L723 Sebaceous cyst: Secondary | ICD-10-CM | POA: Diagnosis not present

## 2012-06-04 DIAGNOSIS — L0291 Cutaneous abscess, unspecified: Secondary | ICD-10-CM | POA: Diagnosis not present

## 2012-06-04 MED ORDER — BISOPROLOL FUMARATE 10 MG PO TABS: 10.0000 mg | ORAL_TABLET | Freq: Every day | ORAL | Status: DC

## 2012-06-04 NOTE — Progress Notes (Signed)
Patient ID: Steven Macdonald, male   DOB: August 12, 1934, 77 y.o.   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  In 3/12 EF down a bit to 20-25% with significant LV dysynchrony with moderate MR and mod LAE.  However repeat echo 6/12 read as 50-55%. I reviewed echo personally and EF probably more in the 35-40% range with dyssynchrony  He returns for follow up. Denies SOB/PND/Orthopnea/edema. Complaint with medications. Continues to exercise at gym 1 times a week. Does 30 min on treadmill up to 4. with 3% then weights. Walks steps at work. Walks 1.5 miles a day in am with wife.   Meds previously down titrated due to low BP. But BP seems to be coming back up. Previously taking carvedilol 1x/day 50mg  and then down titrated to 25mg . (avoids night dosing to permit sexual activity)   ROS: All systems negative except as listed in HPI, PMH and Problem List.  Past Medical History  Diagnosis Date  . HLD (hyperlipidemia)   . HTN (hypertension)   . Pulmonary embolism   . Cardiomyopathy, nonischemic -resolved     Echo 2008: EF 25% Cardiac CT 2009: Normal cors EF 31% Echo 3/10 EF 45% Echo 6/13: EF 50-55%    . LBBB (left bundle branch block)   . Sinus node dysfunction        . ED (erectile dysfunction)   . Hemorrhoids   . Atrial flutter     RFCA   . Arthritis   . Pacemaker  Medtronic     DOI 2/06    Current Outpatient Prescriptions  Medication Sig Dispense Refill  . carvedilol (COREG) 25 MG tablet Take 1 tablet (25 mg total) by mouth 2 (two) times daily with a meal.  60 tablet  11  . cholecalciferol (VITAMIN D) 1000 UNITS tablet Take 1,000 Units by mouth daily.      . fish oil-omega-3 fatty acids 1000 MG  capsule 1 tab po qd      . lisinopril (PRINIVIL,ZESTRIL) 10 MG tablet Take 1 tablet (10 mg total) by mouth 2 (two) times daily.  60 tablet  3  . sildenafil (VIAGRA) 100 MG tablet Take 100 mg by mouth daily as needed.        . simvastatin (ZOCOR) 40 MG tablet 1/2 tb po bid       . warfarin (COUMADIN) 5 MG tablet Take 1.5 tablets (7.5 mg) by mouth once daily. On Sundays, take only 1 tablet (5 mg)      . glycopyrrolate (ROBINUL-FORTE) 2 MG tablet Take one tablet daily in the morning  30 tablet  3     PHYSICAL EXAM: Filed Vitals:   06/04/12 1128  BP: 136/90  Pulse: 64  General:  Well appearing. No resp difficulty HEENT: normal Neck: supple. JVP flat. Carotids 2+ bilaterally; no bruits. No lymphadenopathy or thryomegaly appreciated. Cor: PMI normal. Regular rate & rhythm. No rubs, gallops or murmurs. Lungs: clear Abdomen: soft, nontender, nondistended. No hepatosplenomegaly. No bruits or masses. Good bowel sounds. Extremities: no cyanosis, clubbing, rash, edema Neuro: alert & orientedx3, cranial nerves grossly intact. Moves all 4 extremities w/o difficulty. Affect pleasant.    ECG: Atrial flutter with 4:1 block v-rate 70. LBBB (164 ms)  ASSESSMENT & PLAN:

## 2012-06-04 NOTE — Assessment & Plan Note (Signed)
He is doing great. EF in th 40% range. Class I. For simplicity purposes will switch from carvedilol to once-a-day bisoprolol 10mg  daily. Due for repeat echo. F/u 1 year.

## 2012-06-04 NOTE — Patient Instructions (Addendum)
Stop Carvedilol Start Bisoprolol 10 mg every AM  Your physician has requested that you have an echocardiogram. Echocardiography is a painless test that uses sound waves to create images of your heart. It provides your doctor with information about the size and shape of your heart and how well your heart's chambers and valves are working. This procedure takes approximately one hour. There are no restrictions for this procedure.  We will contact you in 1 year to schedule your next appointment.

## 2012-06-04 NOTE — Addendum Note (Signed)
Encounter addended by: Noralee Space, RN on: 06/04/2012 12:01 PM<BR>     Documentation filed: Visit Diagnoses, Patient Instructions Section, Orders

## 2012-06-10 ENCOUNTER — Ambulatory Visit (HOSPITAL_COMMUNITY)
Admission: RE | Admit: 2012-06-10 | Discharge: 2012-06-10 | Disposition: A | Discharge status: Home / Self Care | Payer: Medicare Other | Source: Ambulatory Visit | Attending: Internal Medicine | Admitting: Internal Medicine

## 2012-06-10 DIAGNOSIS — I428 Other cardiomyopathies: Secondary | ICD-10-CM | POA: Diagnosis not present

## 2012-06-10 DIAGNOSIS — I059 Rheumatic mitral valve disease, unspecified: Secondary | ICD-10-CM | POA: Diagnosis not present

## 2012-06-10 NOTE — Progress Notes (Signed)
  Echocardiogram 2D Echocardiogram has been performed.  [name] A 06/10/2012, 9:09 AM

## 2012-07-02 DIAGNOSIS — I82409 Acute embolism and thrombosis of unspecified deep veins of unspecified lower extremity: Secondary | ICD-10-CM | POA: Diagnosis not present

## 2012-07-02 DIAGNOSIS — Z7901 Long term (current) use of anticoagulants: Secondary | ICD-10-CM | POA: Diagnosis not present

## 2012-07-02 DIAGNOSIS — I2699 Other pulmonary embolism without acute cor pulmonale: Secondary | ICD-10-CM | POA: Diagnosis not present

## 2012-07-02 DIAGNOSIS — Z23 Encounter for immunization: Secondary | ICD-10-CM | POA: Diagnosis not present

## 2012-08-06 DIAGNOSIS — I82409 Acute embolism and thrombosis of unspecified deep veins of unspecified lower extremity: Secondary | ICD-10-CM | POA: Diagnosis not present

## 2012-08-06 DIAGNOSIS — I2699 Other pulmonary embolism without acute cor pulmonale: Secondary | ICD-10-CM | POA: Diagnosis not present

## 2012-08-06 DIAGNOSIS — Z7901 Long term (current) use of anticoagulants: Secondary | ICD-10-CM | POA: Diagnosis not present

## 2012-08-13 ENCOUNTER — Other Ambulatory Visit: Payer: Self-pay | Admitting: Internal Medicine

## 2012-09-02 DIAGNOSIS — Z85828 Personal history of other malignant neoplasm of skin: Secondary | ICD-10-CM | POA: Diagnosis not present

## 2012-09-02 DIAGNOSIS — L723 Sebaceous cyst: Secondary | ICD-10-CM | POA: Diagnosis not present

## 2012-09-03 DIAGNOSIS — Z7901 Long term (current) use of anticoagulants: Secondary | ICD-10-CM | POA: Diagnosis not present

## 2012-09-03 DIAGNOSIS — I82409 Acute embolism and thrombosis of unspecified deep veins of unspecified lower extremity: Secondary | ICD-10-CM | POA: Diagnosis not present

## 2012-09-03 DIAGNOSIS — I2699 Other pulmonary embolism without acute cor pulmonale: Secondary | ICD-10-CM | POA: Diagnosis not present

## 2012-10-02 DIAGNOSIS — Z7901 Long term (current) use of anticoagulants: Secondary | ICD-10-CM | POA: Diagnosis not present

## 2012-10-02 DIAGNOSIS — I2699 Other pulmonary embolism without acute cor pulmonale: Secondary | ICD-10-CM | POA: Diagnosis not present

## 2012-10-02 DIAGNOSIS — I82409 Acute embolism and thrombosis of unspecified deep veins of unspecified lower extremity: Secondary | ICD-10-CM | POA: Diagnosis not present

## 2012-10-17 ENCOUNTER — Encounter: Payer: Self-pay | Admitting: Internal Medicine

## 2012-10-17 ENCOUNTER — Ambulatory Visit (INDEPENDENT_AMBULATORY_CARE_PROVIDER_SITE_OTHER): Payer: Medicare Other | Admitting: Internal Medicine

## 2012-10-17 VITALS — BP 111/80 | HR 68 | Ht 75.5 in | Wt 187.0 lb

## 2012-10-17 DIAGNOSIS — I428 Other cardiomyopathies: Secondary | ICD-10-CM

## 2012-10-17 DIAGNOSIS — I2699 Other pulmonary embolism without acute cor pulmonale: Secondary | ICD-10-CM | POA: Diagnosis not present

## 2012-10-17 DIAGNOSIS — Z950 Presence of cardiac pacemaker: Secondary | ICD-10-CM

## 2012-10-17 DIAGNOSIS — I495 Sick sinus syndrome: Secondary | ICD-10-CM | POA: Diagnosis not present

## 2012-10-17 LAB — PACEMAKER DEVICE OBSERVATION
ATRIAL PACING PM: 98
BAMS-0001: 175 {beats}/min
RV LEAD AMPLITUDE: 5.6 mv
RV LEAD IMPEDENCE PM: 641 Ohm
RV LEAD THRESHOLD: 0.75 V

## 2012-10-17 NOTE — Progress Notes (Signed)
Patient Care Team: Ezequiel Kayser, MD as PCP - General   HPI  Steven Macdonald is a 78 y.o. male Seen in followup for AFlutter s/p RFCA He has had no recurrent arrhythmia.  He has a previously implanted dual-chamber Medtronic pacemaker sinus node dysfunction  He continues to feel well without chest pain exercise intolerance or shortness of breath.  He was seen in the heart failure clinic 12/13. There have been variable reports a left ventricular function initially in the 25% range with interval improvement. Echocardiogram 6/12 with crit is 50-55% which upon further review by Dr. Dorthea Cove. Repeat echo 12/13 with reticulocyte and however at 50-55%. He is quite active.    Past Medical History  Diagnosis Date  . HLD (hyperlipidemia)   . HTN (hypertension)   . Pulmonary embolism   . Cardiomyopathy, nonischemic -resolved     Echo 2008: EF 25% Cardiac CT 2009: Normal cors EF 31% Echo 3/10 EF 45% Echo 6/13: EF 50-55%    . LBBB (left bundle branch block)   . Sinus node dysfunction        . ED (erectile dysfunction)   . Hemorrhoids   . Atrial flutter     RFCA   . Arthritis   . Pacemaker  Medtronic     DOI 2/06    Past Surgical History  Procedure Laterality Date  . Pacemaker insertion    . Cystoscopy w/ transurethral resection of posterior uretheral valves      Current Outpatient Prescriptions  Medication Sig Dispense Refill  . bisoprolol (ZEBETA) 10 MG tablet Take 1 tablet (10 mg total) by mouth daily.  30 tablet  6  . cholecalciferol (VITAMIN D) 1000 UNITS tablet Take 1,000 Units by mouth daily.      . fish oil-omega-3 fatty acids 1000 MG capsule 1 tab po qd      . glycopyrrolate (ROBINUL-FORTE) 2 MG tablet Take one tablet daily in the morning  30 tablet  3  . lisinopril (PRINIVIL,ZESTRIL) 10 MG tablet TAKE 1 TABLET TWICE DAILY.  60 tablet  3  . sildenafil (VIAGRA) 100 MG tablet Take 100 mg by mouth daily as needed.        . simvastatin (ZOCOR) 40 MG tablet 1/2 tb po bid       .  warfarin (COUMADIN) 5 MG tablet Take 1.5 tablets (7.5 mg) by mouth once daily.       No current facility-administered medications for this visit.    No Known Allergies  Review of Systems negative except from HPI and PMH  Physical Exam BP 111/80  Pulse 68  Ht 6' 3.5" (1.918 m)  Wt 187 lb (84.823 kg)  BMI 23.06 kg/m2 Well developed and well nourished in no acute distress HENT normal E scleral and icterus clear Neck Supple JVP flat; carotids brisk and full Clear to ausculation  Regular rate and rhythm, no murmurs gallops or rub Soft with active bowel sounds No clubbing cyanosis none Edema Alert and oriented, grossly normal motor and sensory function Skin Warm and Dry    Assessment and  Plan

## 2012-10-17 NOTE — Assessment & Plan Note (Signed)
100% atrial pacing with good exercise tolerance

## 2012-10-17 NOTE — Patient Instructions (Addendum)
Your physician wants you to follow-up in: 1 year with Dr Graciela Husbands. You will receive a reminder letter in the mail two months in advance. If you don't receive a letter, please call our office to schedule the follow-up appointment.  Remote monitoring is used to monitor your Pacemaker of ICD from home. This monitoring reduces the number of office visits required to check your device to one time per year. It allows [place] to keep an eye on the functioning of your device to ensure it is working properly. You are scheduled for a device check from home on 01/20/13. You may send your transmission at any time that day. If you have a wireless device, the transmission will be sent automatically. After your physician reviews your transmission, you will receive a postcard with your next transmission date.

## 2012-10-17 NOTE — Assessment & Plan Note (Signed)
The patient's device was interrogated.  The information was reviewed. No changes were made in the programming.    

## 2012-10-17 NOTE — Assessment & Plan Note (Signed)
Cardiomyopathy is resolved. He is tolerating beta blockers and ACE inhibitors and so we will continue

## 2012-10-17 NOTE — Assessment & Plan Note (Signed)
We had a lengthy discussion today regarding NOACs versus Coumadin. We discussed risks and benefits. Discussed the limited, i.e. Relatively short term data, of NOACs and pulmonary embolism. He would like to discuss this further with Dr. Yehuda Budd

## 2012-11-07 DIAGNOSIS — I82409 Acute embolism and thrombosis of unspecified deep veins of unspecified lower extremity: Secondary | ICD-10-CM | POA: Diagnosis not present

## 2012-11-07 DIAGNOSIS — I2699 Other pulmonary embolism without acute cor pulmonale: Secondary | ICD-10-CM | POA: Diagnosis not present

## 2012-11-07 DIAGNOSIS — Z7901 Long term (current) use of anticoagulants: Secondary | ICD-10-CM | POA: Diagnosis not present

## 2012-12-09 ENCOUNTER — Other Ambulatory Visit (HOSPITAL_COMMUNITY): Payer: Self-pay | Admitting: *Deleted

## 2012-12-09 MED ORDER — BISOPROLOL FUMARATE 10 MG PO TABS: 10.0000 mg | ORAL_TABLET | Freq: Every day | ORAL | Status: DC

## 2012-12-10 ENCOUNTER — Other Ambulatory Visit: Payer: Self-pay | Admitting: Emergency Medicine

## 2012-12-10 DIAGNOSIS — Z7901 Long term (current) use of anticoagulants: Secondary | ICD-10-CM | POA: Diagnosis not present

## 2012-12-10 DIAGNOSIS — I82409 Acute embolism and thrombosis of unspecified deep veins of unspecified lower extremity: Secondary | ICD-10-CM | POA: Diagnosis not present

## 2012-12-10 DIAGNOSIS — I2699 Other pulmonary embolism without acute cor pulmonale: Secondary | ICD-10-CM | POA: Diagnosis not present

## 2012-12-10 MED ORDER — LISINOPRIL 10 MG PO TABS: ORAL_TABLET | ORAL | Status: DC

## 2012-12-13 ENCOUNTER — Other Ambulatory Visit (HOSPITAL_COMMUNITY): Payer: Self-pay | Admitting: *Deleted

## 2012-12-13 MED ORDER — BISOPROLOL FUMARATE 10 MG PO TABS: 10.0000 mg | ORAL_TABLET | Freq: Every day | ORAL | Status: DC

## 2012-12-16 ENCOUNTER — Other Ambulatory Visit: Payer: Self-pay | Admitting: *Deleted

## 2012-12-16 ENCOUNTER — Other Ambulatory Visit (HOSPITAL_COMMUNITY): Payer: Self-pay | Admitting: *Deleted

## 2012-12-16 MED ORDER — LISINOPRIL 10 MG PO TABS: ORAL_TABLET | ORAL | Status: DC

## 2012-12-16 MED ORDER — BISOPROLOL FUMARATE 10 MG PO TABS: 10.0000 mg | ORAL_TABLET | Freq: Every day | ORAL | Status: DC

## 2012-12-19 ENCOUNTER — Other Ambulatory Visit (HOSPITAL_COMMUNITY): Payer: Self-pay | Admitting: *Deleted

## 2012-12-19 MED ORDER — BISOPROLOL FUMARATE 10 MG PO TABS: 10.0000 mg | ORAL_TABLET | Freq: Every day | ORAL | Status: DC

## 2013-01-07 DIAGNOSIS — I2699 Other pulmonary embolism without acute cor pulmonale: Secondary | ICD-10-CM | POA: Diagnosis not present

## 2013-01-07 DIAGNOSIS — I82409 Acute embolism and thrombosis of unspecified deep veins of unspecified lower extremity: Secondary | ICD-10-CM | POA: Diagnosis not present

## 2013-01-07 DIAGNOSIS — Z7901 Long term (current) use of anticoagulants: Secondary | ICD-10-CM | POA: Diagnosis not present

## 2013-01-20 ENCOUNTER — Ambulatory Visit (INDEPENDENT_AMBULATORY_CARE_PROVIDER_SITE_OTHER): Payer: Medicare Other | Admitting: *Deleted

## 2013-01-20 DIAGNOSIS — Z950 Presence of cardiac pacemaker: Secondary | ICD-10-CM

## 2013-01-20 DIAGNOSIS — I495 Sick sinus syndrome: Secondary | ICD-10-CM | POA: Diagnosis not present

## 2013-01-24 LAB — REMOTE PACEMAKER DEVICE
AL IMPEDENCE PM: 556 Ohm
ATRIAL PACING PM: 99
BATTERY VOLTAGE: 2.72 V
RV LEAD IMPEDENCE PM: 647 Ohm
VENTRICULAR PACING PM: 0

## 2013-01-30 DIAGNOSIS — E119 Type 2 diabetes mellitus without complications: Secondary | ICD-10-CM | POA: Diagnosis not present

## 2013-01-30 DIAGNOSIS — Z125 Encounter for screening for malignant neoplasm of prostate: Secondary | ICD-10-CM | POA: Diagnosis not present

## 2013-01-30 DIAGNOSIS — Z7901 Long term (current) use of anticoagulants: Secondary | ICD-10-CM | POA: Diagnosis not present

## 2013-01-30 DIAGNOSIS — I10 Essential (primary) hypertension: Secondary | ICD-10-CM | POA: Diagnosis not present

## 2013-02-03 DIAGNOSIS — Z1212 Encounter for screening for malignant neoplasm of rectum: Secondary | ICD-10-CM | POA: Diagnosis not present

## 2013-02-10 DIAGNOSIS — I498 Other specified cardiac arrhythmias: Secondary | ICD-10-CM | POA: Diagnosis not present

## 2013-02-10 DIAGNOSIS — I10 Essential (primary) hypertension: Secondary | ICD-10-CM | POA: Diagnosis not present

## 2013-02-10 DIAGNOSIS — E119 Type 2 diabetes mellitus without complications: Secondary | ICD-10-CM | POA: Diagnosis not present

## 2013-02-10 DIAGNOSIS — Z1331 Encounter for screening for depression: Secondary | ICD-10-CM | POA: Diagnosis not present

## 2013-02-10 DIAGNOSIS — I509 Heart failure, unspecified: Secondary | ICD-10-CM | POA: Diagnosis not present

## 2013-02-10 DIAGNOSIS — Z125 Encounter for screening for malignant neoplasm of prostate: Secondary | ICD-10-CM | POA: Diagnosis not present

## 2013-02-10 DIAGNOSIS — I2699 Other pulmonary embolism without acute cor pulmonale: Secondary | ICD-10-CM | POA: Diagnosis not present

## 2013-02-10 DIAGNOSIS — Z0000 Encounter for general adult medical examination without abnormal findings: Secondary | ICD-10-CM | POA: Diagnosis not present

## 2013-02-10 DIAGNOSIS — N401 Enlarged prostate with lower urinary tract symptoms: Secondary | ICD-10-CM | POA: Diagnosis not present

## 2013-02-14 DIAGNOSIS — H2510 Age-related nuclear cataract, unspecified eye: Secondary | ICD-10-CM | POA: Diagnosis not present

## 2013-02-18 ENCOUNTER — Encounter: Payer: Self-pay | Admitting: *Deleted

## 2013-02-20 DIAGNOSIS — Z7901 Long term (current) use of anticoagulants: Secondary | ICD-10-CM | POA: Diagnosis not present

## 2013-02-20 DIAGNOSIS — I2699 Other pulmonary embolism without acute cor pulmonale: Secondary | ICD-10-CM | POA: Diagnosis not present

## 2013-03-13 DIAGNOSIS — Z7901 Long term (current) use of anticoagulants: Secondary | ICD-10-CM | POA: Diagnosis not present

## 2013-03-13 DIAGNOSIS — Z23 Encounter for immunization: Secondary | ICD-10-CM | POA: Diagnosis not present

## 2013-03-13 DIAGNOSIS — I2699 Other pulmonary embolism without acute cor pulmonale: Secondary | ICD-10-CM | POA: Diagnosis not present

## 2013-03-13 DIAGNOSIS — I82409 Acute embolism and thrombosis of unspecified deep veins of unspecified lower extremity: Secondary | ICD-10-CM | POA: Diagnosis not present

## 2013-03-25 ENCOUNTER — Encounter: Payer: Self-pay | Admitting: Internal Medicine

## 2013-04-10 ENCOUNTER — Other Ambulatory Visit (HOSPITAL_COMMUNITY): Payer: Self-pay | Admitting: Internal Medicine

## 2013-04-17 DIAGNOSIS — N529 Male erectile dysfunction, unspecified: Secondary | ICD-10-CM | POA: Diagnosis not present

## 2013-04-17 DIAGNOSIS — N401 Enlarged prostate with lower urinary tract symptoms: Secondary | ICD-10-CM | POA: Diagnosis not present

## 2013-04-17 DIAGNOSIS — N139 Obstructive and reflux uropathy, unspecified: Secondary | ICD-10-CM | POA: Diagnosis not present

## 2013-04-22 DIAGNOSIS — I2699 Other pulmonary embolism without acute cor pulmonale: Secondary | ICD-10-CM | POA: Diagnosis not present

## 2013-04-22 DIAGNOSIS — Z7901 Long term (current) use of anticoagulants: Secondary | ICD-10-CM | POA: Diagnosis not present

## 2013-04-28 ENCOUNTER — Encounter: Payer: Medicare Other | Admitting: *Deleted

## 2013-05-09 ENCOUNTER — Encounter: Payer: Self-pay | Admitting: *Deleted

## 2013-05-09 DIAGNOSIS — I2699 Other pulmonary embolism without acute cor pulmonale: Secondary | ICD-10-CM | POA: Diagnosis not present

## 2013-05-09 DIAGNOSIS — R0789 Other chest pain: Secondary | ICD-10-CM | POA: Diagnosis not present

## 2013-05-09 DIAGNOSIS — IMO0002 Reserved for concepts with insufficient information to code with codable children: Secondary | ICD-10-CM | POA: Diagnosis not present

## 2013-05-09 DIAGNOSIS — M5030 Other cervical disc degeneration, unspecified cervical region: Secondary | ICD-10-CM | POA: Diagnosis not present

## 2013-05-09 DIAGNOSIS — R079 Chest pain, unspecified: Secondary | ICD-10-CM | POA: Diagnosis not present

## 2013-05-09 DIAGNOSIS — Z7901 Long term (current) use of anticoagulants: Secondary | ICD-10-CM | POA: Diagnosis not present

## 2013-05-09 DIAGNOSIS — I10 Essential (primary) hypertension: Secondary | ICD-10-CM | POA: Diagnosis not present

## 2013-05-18 ENCOUNTER — Encounter: Payer: Self-pay | Admitting: Internal Medicine

## 2013-05-18 LAB — MDC_IDC_ENUM_SESS_TYPE_REMOTE
Battery Remaining Longevity: 21 mo
Brady Statistic AS VP Percent: 0 %
Date Time Interrogation Session: 20141130194752
Lead Channel Pacing Threshold Amplitude: 0.5 V
Lead Channel Pacing Threshold Pulse Width: 0.4 ms
Lead Channel Sensing Intrinsic Amplitude: 16 mV
Lead Channel Setting Pacing Amplitude: 1.5 V
Lead Channel Setting Sensing Sensitivity: 2.8 mV

## 2013-05-19 ENCOUNTER — Ambulatory Visit (INDEPENDENT_AMBULATORY_CARE_PROVIDER_SITE_OTHER): Payer: Medicare Other | Admitting: *Deleted

## 2013-05-19 DIAGNOSIS — Z7901 Long term (current) use of anticoagulants: Secondary | ICD-10-CM | POA: Diagnosis not present

## 2013-05-19 DIAGNOSIS — Z950 Presence of cardiac pacemaker: Secondary | ICD-10-CM

## 2013-05-19 DIAGNOSIS — I495 Sick sinus syndrome: Secondary | ICD-10-CM

## 2013-05-19 DIAGNOSIS — I2699 Other pulmonary embolism without acute cor pulmonale: Secondary | ICD-10-CM | POA: Diagnosis not present

## 2013-06-10 DIAGNOSIS — I2699 Other pulmonary embolism without acute cor pulmonale: Secondary | ICD-10-CM | POA: Diagnosis not present

## 2013-06-10 DIAGNOSIS — Z7901 Long term (current) use of anticoagulants: Secondary | ICD-10-CM | POA: Diagnosis not present

## 2013-06-10 DIAGNOSIS — I82409 Acute embolism and thrombosis of unspecified deep veins of unspecified lower extremity: Secondary | ICD-10-CM | POA: Diagnosis not present

## 2013-06-17 ENCOUNTER — Encounter (HOSPITAL_COMMUNITY): Payer: Self-pay | Admitting: Cardiology

## 2013-06-17 ENCOUNTER — Encounter: Payer: Self-pay | Admitting: *Deleted

## 2013-06-17 ENCOUNTER — Telehealth (HOSPITAL_COMMUNITY): Payer: Self-pay | Admitting: Cardiology

## 2013-06-17 NOTE — Telephone Encounter (Signed)
Attempting to contact pt regarding follow up I have been unable to reach this patient by phone.  A letter is being sent to the last known home address.  

## 2013-06-25 DIAGNOSIS — I2699 Other pulmonary embolism without acute cor pulmonale: Secondary | ICD-10-CM | POA: Diagnosis not present

## 2013-06-25 DIAGNOSIS — Z7901 Long term (current) use of anticoagulants: Secondary | ICD-10-CM | POA: Diagnosis not present

## 2013-06-25 DIAGNOSIS — I82409 Acute embolism and thrombosis of unspecified deep veins of unspecified lower extremity: Secondary | ICD-10-CM | POA: Diagnosis not present

## 2013-07-07 ENCOUNTER — Telehealth (HOSPITAL_COMMUNITY): Payer: Self-pay | Admitting: *Deleted

## 2013-07-07 DIAGNOSIS — R079 Chest pain, unspecified: Secondary | ICD-10-CM

## 2013-07-07 NOTE — Telephone Encounter (Signed)
myoview sch for 1/28 at 11:45, called pt and Left message to call back

## 2013-07-07 NOTE — Telephone Encounter (Signed)
Pt called to report occ CP, he states it has been off/on for about 3-4 weeks, it doesn't occur every day but some days may occur 3-4 times, it usually resolves on its own in about 1-2 hours, he states it is an "intense" pain on side of chest and through to his back, does not radiate down arm or up to jaw.  Pt denies SOB, nausea or vomiting.  Pt also states he has been continuing to work out at [employer] and has no discomfort at that time, pt is sch for f/u appt and echo on 1/29.  Discussed w/Dr Bensimhon, he would like pt to have a gxt myoview, pt is aware and agreeable will sch and call pt back with date and time

## 2013-07-10 ENCOUNTER — Other Ambulatory Visit (HOSPITAL_COMMUNITY): Payer: Self-pay | Admitting: Cardiology

## 2013-07-10 DIAGNOSIS — Z7901 Long term (current) use of anticoagulants: Secondary | ICD-10-CM | POA: Diagnosis not present

## 2013-07-10 DIAGNOSIS — I509 Heart failure, unspecified: Secondary | ICD-10-CM

## 2013-07-10 DIAGNOSIS — I82409 Acute embolism and thrombosis of unspecified deep veins of unspecified lower extremity: Secondary | ICD-10-CM | POA: Diagnosis not present

## 2013-07-10 DIAGNOSIS — I2699 Other pulmonary embolism without acute cor pulmonale: Secondary | ICD-10-CM | POA: Diagnosis not present

## 2013-07-15 ENCOUNTER — Encounter: Payer: Self-pay | Admitting: Cardiology

## 2013-07-16 ENCOUNTER — Ambulatory Visit (HOSPITAL_BASED_OUTPATIENT_CLINIC_OR_DEPARTMENT_OTHER): Payer: Medicare Other | Admitting: Radiology

## 2013-07-16 VITALS — BP 111/83 | Ht 75.5 in | Wt 186.0 lb

## 2013-07-16 DIAGNOSIS — I509 Heart failure, unspecified: Secondary | ICD-10-CM | POA: Diagnosis not present

## 2013-07-16 DIAGNOSIS — I079 Rheumatic tricuspid valve disease, unspecified: Secondary | ICD-10-CM | POA: Diagnosis not present

## 2013-07-16 DIAGNOSIS — R079 Chest pain, unspecified: Secondary | ICD-10-CM | POA: Diagnosis not present

## 2013-07-16 DIAGNOSIS — I77819 Aortic ectasia, unspecified site: Secondary | ICD-10-CM | POA: Diagnosis not present

## 2013-07-16 DIAGNOSIS — I2589 Other forms of chronic ischemic heart disease: Secondary | ICD-10-CM | POA: Diagnosis not present

## 2013-07-16 DIAGNOSIS — E785 Hyperlipidemia, unspecified: Secondary | ICD-10-CM | POA: Diagnosis not present

## 2013-07-16 DIAGNOSIS — I10 Essential (primary) hypertension: Secondary | ICD-10-CM | POA: Diagnosis not present

## 2013-07-16 DIAGNOSIS — I447 Left bundle-branch block, unspecified: Secondary | ICD-10-CM

## 2013-07-16 DIAGNOSIS — I253 Aneurysm of heart: Secondary | ICD-10-CM | POA: Diagnosis not present

## 2013-07-16 MED ORDER — ADENOSINE (DIAGNOSTIC) 3 MG/ML IV SOLN
0.5600 mg/kg | Freq: Once | INTRAVENOUS | Status: AC
  Administered 2013-07-16: 47.4 mg via INTRAVENOUS

## 2013-07-16 MED ORDER — TECHNETIUM TC 99M SESTAMIBI GENERIC - CARDIOLITE
30.0000 | Freq: Once | INTRAVENOUS | Status: AC | PRN
  Administered 2013-07-16: 30 via INTRAVENOUS

## 2013-07-16 MED ORDER — TECHNETIUM TC 99M SESTAMIBI GENERIC - CARDIOLITE
10.0000 | Freq: Once | INTRAVENOUS | Status: AC | PRN
  Administered 2013-07-16: 10 via INTRAVENOUS

## 2013-07-16 NOTE — Progress Notes (Signed)
[facility] [address] [place], [place] [address] [phone]    Cardiology Nuclear Med Study  Steven Macdonald is a 79 y.o. male     MRN : [id number]     DOB: 23-Jul-1934  Procedure Date: 07/16/2013  Nuclear Med Background Indication for Stress Test:  Evaluation for Ischemia History:  PVTP 2nd to Bradycardia H/O Aflutter with Ablation H/O PE, 6/13 ECHO: EF: 50-55% Cardiac Risk Factors: History of Smoking, Hypertension and LBBB  Symptoms:  Chest Pain and Palpitations   Nuclear Pre-Procedure Caffeine/Decaff Intake:  None NPO After: 8:00pm   Lungs:  clear O2 Sat: 95% on room air. IV 0.9% NS with Angio Cath:  22g  IV Site: R Antecubital  IV Started by:  Crissie Figures, RN  Chest Size (in):  44 Cup Size: n/a  Height: 6' 3.5" (1.918 m)  Weight:  186 lb (84.369 kg)  BMI:  Body mass index is 22.93 kg/(m^2). Tech Comments:  N/A    Nuclear Med Study 1 or 2 day study: 1 day  Stress Test Type:  Adenosine  Reading MD: N/A  Order Authorizing Provider:  Glori Bickers, MD  Resting Radionuclide: Technetium 46m Sestamibi  Resting Radionuclide Dose: 11.0 mCi   Stress Radionuclide:  Technetium 67m Sestamibi  Stress Radionuclide Dose: 33.0 mCi           Stress Protocol Rest HR: 60 Stress HR: 62  Rest BP: 111/83 Stress BP: 130/73  Exercise Time (min): n/a METS: n/a   Predicted Max HR: 144 bpm % Max HR: 43.06 bpm Rate Pressure Product: 8060   Dose of Adenosine (mg):  47.3 Dose of Lexiscan: n/a mg  Dose of Atropine (mg): n/a Dose of Dobutamine: n/a mcg/kg/min (at max HR)  Stress Test Technologist: Perrin Maltese, EMT-P  Nuclear Technologist:  Vedia Pereyra, CNMT     Rest Procedure:  Myocardial perfusion imaging was performed at rest 45 minutes following the intravenous administration of Technetium 16m Sestamibi. Rest ECG: NSR-LBBB  Stress Procedure:  The patient received IV adenosine at 140 mcg/kg/min for 4 minutes.  Technetium 66m Sestamibi was  injected at the 2 minute mark and quantitative spect images were obtained after a 45 minute delay. Stress ECG: No significant change from baseline ECG  QPS Raw Data Images:  Normal; no motion artifact; normal heart/lung ratio. Stress Images:  There is mild decreased uptake in the apical, distal inferoseptal, distal anteroseptal wall, fixed defect. No ischemia.  Rest Images:  There is mild decreased uptake in the apical, distal inferoseptal, distal anteroseptal wall, fixed defect. No ischemia.  Subtraction (SDS):  No evidence of ischemia. Transient Ischemic Dilatation (Normal <1.22):  1.10 Lung/Heart Ratio (Normal <0.45):  0.28  Quantitative Gated Spect Images QGS EDV:  155 ml QGS ESV:  83 ml  Impression Exercise Capacity:  Adenosine study with no exercise. BP Response:  Normal blood pressure response. Clinical Symptoms:  Typical symptoms with adenosine. ECG Impression:  No significant ST segment change suggestive of ischemia. Comparison with Prior Nuclear Study: No images to compare  Overall Impression:  Low risk stress nuclear study with no ischemia identified. Fixed defect likely a result of LBBB. .  LV Ejection Fraction: 46%.  LV Wall Motion:  There is distal septal wall hypokinesis.  If symptoms worsen or become more worrisome, further cardiac testing may be warranted.     Candee Furbish, MD

## 2013-07-17 ENCOUNTER — Ambulatory Visit (HOSPITAL_BASED_OUTPATIENT_CLINIC_OR_DEPARTMENT_OTHER)
Admission: RE | Admit: 2013-07-17 | Discharge: 2013-07-17 | Disposition: A | Discharge status: Home / Self Care | Payer: Medicare Other | Source: Ambulatory Visit | Attending: Internal Medicine | Admitting: Internal Medicine

## 2013-07-17 ENCOUNTER — Ambulatory Visit (HOSPITAL_COMMUNITY)
Admission: RE | Admit: 2013-07-17 | Discharge: 2013-07-17 | Disposition: A | Discharge status: Home / Self Care | Payer: Medicare Other | Source: Ambulatory Visit | Attending: Internal Medicine | Admitting: Internal Medicine

## 2013-07-17 ENCOUNTER — Encounter (HOSPITAL_COMMUNITY): Payer: Self-pay

## 2013-07-17 VITALS — BP 110/80 | HR 77 | Ht 75.5 in | Wt 188.0 lb

## 2013-07-17 DIAGNOSIS — I428 Other cardiomyopathies: Secondary | ICD-10-CM | POA: Diagnosis not present

## 2013-07-17 DIAGNOSIS — I2589 Other forms of chronic ischemic heart disease: Secondary | ICD-10-CM | POA: Insufficient documentation

## 2013-07-17 DIAGNOSIS — R072 Precordial pain: Secondary | ICD-10-CM | POA: Insufficient documentation

## 2013-07-17 DIAGNOSIS — E785 Hyperlipidemia, unspecified: Secondary | ICD-10-CM | POA: Insufficient documentation

## 2013-07-17 DIAGNOSIS — I77819 Aortic ectasia, unspecified site: Secondary | ICD-10-CM | POA: Insufficient documentation

## 2013-07-17 DIAGNOSIS — I447 Left bundle-branch block, unspecified: Secondary | ICD-10-CM | POA: Insufficient documentation

## 2013-07-17 DIAGNOSIS — I059 Rheumatic mitral valve disease, unspecified: Secondary | ICD-10-CM | POA: Diagnosis not present

## 2013-07-17 DIAGNOSIS — I079 Rheumatic tricuspid valve disease, unspecified: Secondary | ICD-10-CM | POA: Insufficient documentation

## 2013-07-17 DIAGNOSIS — I253 Aneurysm of heart: Secondary | ICD-10-CM | POA: Insufficient documentation

## 2013-07-17 DIAGNOSIS — I10 Essential (primary) hypertension: Secondary | ICD-10-CM | POA: Insufficient documentation

## 2013-07-17 DIAGNOSIS — I509 Heart failure, unspecified: Secondary | ICD-10-CM

## 2013-07-17 MED ORDER — PANTOPRAZOLE SODIUM 40 MG PO TBEC: 40.0000 mg | DELAYED_RELEASE_TABLET | Freq: Every day | ORAL | Status: DC

## 2013-07-17 NOTE — Addendum Note (Signed)
Encounter addended by: Scarlette Calico, RN on: 07/17/2013 11:50 AM<BR>     Documentation filed: Patient Instructions Section, Orders

## 2013-07-17 NOTE — Progress Notes (Signed)
Echocardiogram 2D Echocardiogram has been performed.  Steven Macdonald 07/17/2013, 9:57 AM

## 2013-07-17 NOTE — Progress Notes (Signed)
Patient ID: Steven Macdonald, male   DOB: 24-Aug-1934, 79 y.o.   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  In 3/12 EF down a bit to 20-25% with significant LV dysynchrony with moderate MR and mod LAE.  However repeat echo 6/12 read as 50-55%. Reviewed EF probably more in the 40-45% range with dyssynchrony Echo 07/17/13 EF 45-50%  Follow up: Last visit switched from coreg to bisoprolol which he tolerated. Denies SOB, orthopnea, PND or edema. +CP since yearly December. Notices it mostly at work and after eating. Reports that on L side of chest and sometimes radiates down left arm. Lasts usually 30-45 min, reports sitting straight up usually helps. Still exercising. Walks 2 miles every morning without problem. Also goes to gym 2-3x/week for 60-90 minutes without angina. Associates with eating and stress. Yesterday myoview which showed LV Ejection Fraction: 46%. LV Wall Motion: There is distal septal wall hypokinesis. Fixed defect due to LBBB. No ischemia. No reflux. Last week had a dark bowel movement with some red blood. Last colonoscopy was 3-4 years ago. Had one precancerous polyp in 80s otherwise all scopes clear. Never had EGD. No h/o GB disease.  Was taking extra coumadin by mistake for a few weeks. INR got up around 4 but now back down.     ROS: All systems negative except as listed in HPI, PMH and Problem List.  Past Medical History  Diagnosis Date  . HLD (hyperlipidemia)   . HTN (hypertension)   . Pulmonary embolism   . Cardiomyopathy, nonischemic -resolved     Echo 2008: EF 25% Cardiac CT 2009: Normal cors EF 31% Echo 3/10 EF 45% Echo 6/13: EF 50-55%    . LBBB (left bundle branch block)   . Sinus  node dysfunction        . ED (erectile dysfunction)   . Hemorrhoids   . Atrial flutter     RFCA   . Arthritis   . Pacemaker  Medtronic     DOI 2/06    Current Outpatient Prescriptions  Medication Sig Dispense Refill  . bisoprolol (ZEBETA) 10 MG tablet Take 1 tablet (10 mg total) by mouth daily.  90 tablet  1  . cholecalciferol (VITAMIN D) 1000 UNITS tablet Take 1,000 Units by mouth daily.      . fish oil-omega-3 fatty acids 1000 MG capsule 1 tab po qd      . lisinopril (PRINIVIL,ZESTRIL) 10 MG tablet TAKE 1 TABLET TWICE DAILY.  90 tablet  3  . Psyllium (METAMUCIL MULTIHEALTH FIBER PO) Take by mouth.      . sildenafil (VIAGRA) 100 MG tablet Take 100 mg by mouth daily as needed.        . simvastatin (ZOCOR) 40 MG tablet 1/2 tb po bid       . warfarin (COUMADIN) 5 MG tablet Take 1.5 tablets (7.5 mg) by mouth once daily.       No current facility-administered medications for this encounter.   Filed Vitals:   07/17/13 1015  BP: 110/80  Pulse: 77  Height: 6' 3.5" (1.918 m)  Weight: 188 lb (85.276 kg)  SpO2: 98%     PHYSICAL EXAM: General:  Well appearing. No resp difficulty HEENT: normal  Neck: supple. JVP flat. Carotids 2+ bilaterally; no bruits. No lymphadenopathy or thryomegaly appreciated. Cor: PMI normal. Regular rate & rhythm. No rubs, gallops or murmurs. Lungs: clear Abdomen: soft, nontender, nondistended. No hepatosplenomegaly. No bruits or masses. Good bowel sounds. Extremities: no cyanosis, clubbing, rash, edema Neuro: alert & orientedx3, cranial nerves grossly intact. Moves all 4 extremities w/o difficulty. Affect pleasant.   ASSESSMENT & PLAN:  1. Nonischemic CM 2. LBBB 3. Chest pain  4. Symptomatic bradycardia s/p PPM 5. H/o PE on coumadin  Patient seen and examined with Junie Bame, NP. We discussed all aspects of the encounter. I agree with the assessment and plan as stated above.   Very stable from a cardiac perspective. I reviewed echo personally and  EF 45-50%. No clinical HF symptoms. He remains very active. Given stress test results and lack of exertional symptoms, I very much doubt CP is anginal. I am more concerned over a GI etiology (GERD, PUD, GB, etc). Will start protonix 40 daily and refer to Dr. Henrene Pastor, his gastroenterologist for consideration of endoscsopy.   Estera Ozier,MD 11:45 AM

## 2013-07-17 NOTE — Patient Instructions (Signed)
Start Protonix 40 mg daily  Follow up with Dr Scarlette Shorts in 1-2 weeks  We will contact you in 6 months to schedule your next appointment.

## 2013-07-28 ENCOUNTER — Encounter: Payer: Self-pay | Admitting: Internal Medicine

## 2013-07-29 ENCOUNTER — Other Ambulatory Visit: Payer: Self-pay | Admitting: Internal Medicine

## 2013-07-29 DIAGNOSIS — R079 Chest pain, unspecified: Secondary | ICD-10-CM

## 2013-07-30 ENCOUNTER — Ambulatory Visit: Payer: Medicare Other | Admitting: Internal Medicine

## 2013-07-30 ENCOUNTER — Ambulatory Visit (INDEPENDENT_AMBULATORY_CARE_PROVIDER_SITE_OTHER): Payer: Medicare Other | Admitting: Internal Medicine

## 2013-07-30 ENCOUNTER — Ambulatory Visit
Admission: RE | Admit: 2013-07-30 | Discharge: 2013-07-30 | Disposition: A | Discharge status: Home / Self Care | Payer: Medicare Other | Source: Ambulatory Visit | Attending: Internal Medicine | Admitting: Internal Medicine

## 2013-07-30 ENCOUNTER — Encounter: Payer: Self-pay | Admitting: Internal Medicine

## 2013-07-30 VITALS — BP 100/70 | HR 73 | Ht 75.5 in | Wt 184.0 lb

## 2013-07-30 DIAGNOSIS — Z86010 Personal history of colonic polyps: Secondary | ICD-10-CM

## 2013-07-30 DIAGNOSIS — R079 Chest pain, unspecified: Secondary | ICD-10-CM

## 2013-07-30 DIAGNOSIS — I77810 Thoracic aortic ectasia: Secondary | ICD-10-CM | POA: Diagnosis not present

## 2013-07-30 MED ORDER — IOHEXOL 350 MG/ML SOLN
80.0000 mL | Freq: Once | INTRAVENOUS | Status: AC | PRN
  Administered 2013-07-30: 80 mL via INTRAVENOUS

## 2013-07-30 NOTE — Progress Notes (Signed)
HISTORY OF PRESENT ILLNESS:  Steven Macdonald is a 79 y.o. male with multiple significant medical problems including hypertension, hyperlipidemia, cardiomyopathy, atrial arrhythmia status post radiofrequency ablation and pacemaker placement, and a history of pulmonary embolism on chronic Coumadin. He also has a history of IBS and hyperplastic colon polyps. Multiple previous colonoscopies with Dr. Velora Heckler. Last colonoscopy March 2010 revealing diminutive rectal polyp (hyperplastic) and small hemorrhoids. Otherwise normal. He is sent today regarding noncardiac chest pain. The patient reports problems with localized chest pain on the higher portion of his left chest. Also noticing discomfort into the left shoulder blade. The problem began in early December and was described as low-grade, but occurring daily. He was seen by Dr. Joylene Draft and the pain was felt to be noncardiac. Subsequently seen by his cardiologist. Recent Myoview study was favorable with ejection fraction 46%. No evidence for ischemia. He was placed on Protonix and GI referral made. The patient reports that his discomfort may last for several hours. No problems after falling asleep. Most noticeable in the evening after dinner. He wonders if stress may be contributing. No obvious relationship to meals. No difficulty with exercising. No dysphagia. No history of reflux. No abdominal complaints. Bowel habits are unchanged. Rare episodes of minor bleeding attributed to hemorrhoids.Marland Kitchen He feels that the PPI may be helping some, but not completely. He feels it PPI may be causing erectile dysfunction. He tells me that he is supposed to have a chest CT later today.  REVIEW OF SYSTEMS:  All non-GI ROS negative except for arthritis  Past Medical History  Diagnosis Date  . HLD (hyperlipidemia)   . HTN (hypertension)   . Pulmonary embolism   . Cardiomyopathy, nonischemic -resolved     Echo 2008: EF 25% Cardiac CT 2009: Normal cors EF 31% Echo 3/10 EF 45% Echo  6/13: EF 50-55%    . LBBB (left bundle branch block)   . Sinus node dysfunction        . ED (erectile dysfunction)   . Hemorrhoids   . Atrial flutter     RFCA   . Arthritis   . Pacemaker  Medtronic     DOI 2/06  . Colon polyps     hyperplastic    Past Surgical History  Procedure Laterality Date  . Pacemaker insertion    . Cystoscopy w/ transurethral resection of posterior uretheral valves      Social History Steven Macdonald  reports that he quit smoking about 42 years ago. He has never used smokeless tobacco. He reports that he drinks alcohol. He reports that he does not use illicit drugs.  family history includes Breast cancer in his mother; Colon cancer in his father; Colon polyps in his father.  No Known Allergies     PHYSICAL EXAMINATION: Vital signs: BP 100/70  Pulse 73  Ht 6' 3.5" (1.918 m)  Wt 184 lb (83.462 kg)  BMI 22.69 kg/m2  Constitutional: generally well-appearing, no acute distress Psychiatric: alert and oriented x3, cooperative Eyes: extraocular movements intact, anicteric, conjunctiva pink Mouth: oral pharynx moist, no lesions Neck: supple no lymphadenopathy Chest wall: Moderate tenderness left chest wall, midline, and a few inches below the clavicle. Palpation reproduces his pain, he is certain Cardiovascular: heart regular rate and rhythm, no murmur Lungs: clear to auscultation bilaterally Abdomen: soft, nontender, nondistended, no obvious ascites, no peritoneal signs, normal bowel sounds, no organomegaly Extremities: no lower extremity edema bilaterally Skin: no lesions on visible extremities Neuro: No focal deficits.  ASSESSMENT:  #1.  Chest pain. Focal reproducible chest pain with palpation on the chest wall. This is some sort of musculoskeletal process. This is not GI. #2. History of hyperplastic colon polyp. No documented adenomas. Given results of last examination, current guidelines would recommend a check up 10 years from that point or 2020.  At that time, he would be 79 years old and age out of routine surveillance. Thus, I do not recommend further surveillance colonoscopy routinely, particularly given other comorbidities.   PLAN:  #1. Discontinue Protonix #2. Keep plans for CT scan of chest as scheduled #3. Followup with Dr. Joylene Draft regarding assessment and management of focal chest wall pain. GI followup as needed

## 2013-07-30 NOTE — Patient Instructions (Signed)
Please follow up with Dr. Perry as needed 

## 2013-08-11 DIAGNOSIS — J3489 Other specified disorders of nose and nasal sinuses: Secondary | ICD-10-CM | POA: Diagnosis not present

## 2013-08-19 DIAGNOSIS — Z7901 Long term (current) use of anticoagulants: Secondary | ICD-10-CM | POA: Diagnosis not present

## 2013-08-19 DIAGNOSIS — I509 Heart failure, unspecified: Secondary | ICD-10-CM | POA: Diagnosis not present

## 2013-08-19 DIAGNOSIS — E119 Type 2 diabetes mellitus without complications: Secondary | ICD-10-CM | POA: Diagnosis not present

## 2013-08-19 DIAGNOSIS — I2699 Other pulmonary embolism without acute cor pulmonale: Secondary | ICD-10-CM | POA: Diagnosis not present

## 2013-08-19 DIAGNOSIS — I10 Essential (primary) hypertension: Secondary | ICD-10-CM | POA: Diagnosis not present

## 2013-08-20 ENCOUNTER — Encounter: Payer: Medicare Other | Admitting: *Deleted

## 2013-08-21 ENCOUNTER — Telehealth: Payer: Self-pay | Admitting: Internal Medicine

## 2013-08-21 NOTE — Telephone Encounter (Signed)
We addressed this issue in the office a few weeks ago. There is no indication for colonoscopy at this time. Not appropriate based on current guidelines. He did not have any clinical or laboratory issues that would warrent colonoscopy and justify the risk. Not sure what is changed since then. His PCP to call me to discuss if he wishes.

## 2013-08-21 NOTE — Telephone Encounter (Signed)
Spoke with pt and he is aware. 

## 2013-08-21 NOTE — Telephone Encounter (Signed)
Pt states he saw his PCP and discussed the colonoscopy with his doctor. Pt states he would like to go ahead and have the colon done if Dr. Henrene Pastor is ok with doing it. Please advise.

## 2013-08-27 ENCOUNTER — Ambulatory Visit (INDEPENDENT_AMBULATORY_CARE_PROVIDER_SITE_OTHER): Payer: Medicare Other | Admitting: *Deleted

## 2013-08-27 DIAGNOSIS — Z950 Presence of cardiac pacemaker: Secondary | ICD-10-CM | POA: Diagnosis not present

## 2013-08-27 DIAGNOSIS — I495 Sick sinus syndrome: Secondary | ICD-10-CM

## 2013-09-04 ENCOUNTER — Other Ambulatory Visit (HOSPITAL_COMMUNITY): Payer: Self-pay | Admitting: Internal Medicine

## 2013-09-08 LAB — MDC_IDC_ENUM_SESS_TYPE_REMOTE
Battery Impedance: 2889 Ohm
Battery Voltage: 2.72 V
Brady Statistic AP VP Percent: 0 %
Brady Statistic AP VS Percent: 99 %
Brady Statistic AS VP Percent: 0 %
Brady Statistic AS VS Percent: 1 %
Date Time Interrogation Session: 20150311233043
Lead Channel Impedance Value: 648 Ohm
Lead Channel Pacing Threshold Amplitude: 0.5 V
Lead Channel Pacing Threshold Pulse Width: 0.4 ms
Lead Channel Pacing Threshold Pulse Width: 0.4 ms
Lead Channel Sensing Intrinsic Amplitude: 5.6 mV
Lead Channel Setting Pacing Amplitude: 2 V
Lead Channel Setting Pacing Pulse Width: 0.4 ms
MDC IDC MSMT BATTERY REMAINING LONGEVITY: 18 mo
MDC IDC MSMT LEADCHNL RA IMPEDANCE VALUE: 590 Ohm
MDC IDC MSMT LEADCHNL RV PACING THRESHOLD AMPLITUDE: 0.625 V
MDC IDC SET LEADCHNL RA PACING AMPLITUDE: 1.5 V
MDC IDC SET LEADCHNL RV SENSING SENSITIVITY: 2 mV

## 2013-09-16 DIAGNOSIS — I82409 Acute embolism and thrombosis of unspecified deep veins of unspecified lower extremity: Secondary | ICD-10-CM | POA: Diagnosis not present

## 2013-09-16 DIAGNOSIS — I2699 Other pulmonary embolism without acute cor pulmonale: Secondary | ICD-10-CM | POA: Diagnosis not present

## 2013-09-16 DIAGNOSIS — Z7901 Long term (current) use of anticoagulants: Secondary | ICD-10-CM | POA: Diagnosis not present

## 2013-10-09 DIAGNOSIS — Z7901 Long term (current) use of anticoagulants: Secondary | ICD-10-CM | POA: Diagnosis not present

## 2013-10-09 DIAGNOSIS — I2699 Other pulmonary embolism without acute cor pulmonale: Secondary | ICD-10-CM | POA: Diagnosis not present

## 2013-10-09 DIAGNOSIS — I82409 Acute embolism and thrombosis of unspecified deep veins of unspecified lower extremity: Secondary | ICD-10-CM | POA: Diagnosis not present

## 2013-10-16 ENCOUNTER — Encounter: Payer: Self-pay | Admitting: *Deleted

## 2013-10-23 ENCOUNTER — Encounter: Payer: Self-pay | Admitting: Internal Medicine

## 2013-10-23 ENCOUNTER — Ambulatory Visit (INDEPENDENT_AMBULATORY_CARE_PROVIDER_SITE_OTHER): Payer: Medicare Other | Admitting: Internal Medicine

## 2013-10-23 VITALS — BP 101/65 | HR 63 | Ht 75.5 in | Wt 182.6 lb

## 2013-10-23 DIAGNOSIS — I428 Other cardiomyopathies: Secondary | ICD-10-CM | POA: Diagnosis not present

## 2013-10-23 DIAGNOSIS — I495 Sick sinus syndrome: Secondary | ICD-10-CM

## 2013-10-23 DIAGNOSIS — Z950 Presence of cardiac pacemaker: Secondary | ICD-10-CM

## 2013-10-23 LAB — MDC_IDC_ENUM_SESS_TYPE_INCLINIC
Battery Impedance: 3084 Ohm
Battery Remaining Longevity: 17 mo
Battery Voltage: 2.71 V
Brady Statistic AP VP Percent: 0 %
Brady Statistic AS VS Percent: 1 %
Date Time Interrogation Session: 20150507162207
Lead Channel Impedance Value: 570 Ohm
Lead Channel Impedance Value: 617 Ohm
Lead Channel Pacing Threshold Amplitude: 0.5 V
Lead Channel Pacing Threshold Amplitude: 0.5 V
Lead Channel Pacing Threshold Pulse Width: 0.4 ms
Lead Channel Sensing Intrinsic Amplitude: 5.6 mV
Lead Channel Setting Pacing Amplitude: 1.5 V
Lead Channel Setting Pacing Amplitude: 2 V
Lead Channel Setting Pacing Pulse Width: 0.4 ms
Lead Channel Setting Sensing Sensitivity: 2.8 mV
MDC IDC MSMT LEADCHNL RA PACING THRESHOLD PULSEWIDTH: 0.4 ms
MDC IDC STAT BRADY AP VS PERCENT: 99 %
MDC IDC STAT BRADY AS VP PERCENT: 0 %

## 2013-10-23 NOTE — Progress Notes (Signed)
      Patient Care Team: Jerlyn Ly, MD as PCP - General   HPI  Steven Macdonald is a 79 y.o. male Seen in followup for AFlutter s/p RFCA He has had no recurrent arrhythmia.  He has a previously implanted dual-chamber Medtronic pacemaker sinus node dysfunction  He continues to feel well without chest pain exercise intolerance or shortness of breath.    He was seen in the heart failure clinic 12/13. There have been variable reports a left ventricular function initially in the 25% range with interval improvement. Echocardiogram 6/12 with crit is 50-55% which upon further review by Dr. Reine Just. Repeat echo 12/13 with reticulocyte and however at 50-55%.  He has a history of pulmonary embolism and is on Coumadin.  He is quite active.   Past Medical History  Diagnosis Date  . HLD (hyperlipidemia)   . HTN (hypertension)   . Pulmonary embolism   . Cardiomyopathy, nonischemic -resolved     Echo 2008: EF 25% Cardiac CT 2009: Normal cors EF 31% Echo 3/10 EF 45% Echo 6/13: EF 50-55%    . LBBB (left bundle branch block)   . Sinus node dysfunction        . ED (erectile dysfunction)   . Hemorrhoids   . Atrial flutter     RFCA   . Arthritis   . Pacemaker  Medtronic     DOI 2/06  . Colon polyps     hyperplastic    Past Surgical History  Procedure Laterality Date  . Pacemaker insertion    . Cystoscopy w/ transurethral resection of posterior uretheral valves      Current Outpatient Prescriptions  Medication Sig Dispense Refill  . bisoprolol (ZEBETA) 10 MG tablet TAKE 1 TABLET DAILY  90 tablet  3  . cholecalciferol (VITAMIN D) 1000 UNITS tablet Take 1,000 Units by mouth daily.      . fish oil-omega-3 fatty acids 1000 MG capsule Take 1 tablet by mouth daily      . lisinopril (PRINIVIL,ZESTRIL) 10 MG tablet Take 20 mg by mouth daily. TAKE 1 TABLET TWICE DAILY.      Marland Kitchen Psyllium (METAMUCIL MULTIHEALTH FIBER PO) Take by mouth daily.       . sildenafil (VIAGRA) 100 MG tablet Take 100 mg by  mouth daily as needed.        . simvastatin (ZOCOR) 40 MG tablet Take 20 mg by mouth 2 (two) times daily.       Marland Kitchen warfarin (COUMADIN) 7.5 MG tablet Take 7.5 mg by mouth daily.       No current facility-administered medications for this visit.    No Known Allergies  Review of Systems negative except from HPI and PMH  Physical Exam BP 101/65  Pulse 63  Ht 6' 3.5" (1.918 m)  Wt 182 lb 9.6 oz (82.827 kg)  BMI 22.52 kg/m2 Well developed and well nourished in no acute distress HENT normal E scleral and icterus clear Neck Supple JVP flat; carotids brisk and full Clear to ausculation  Regular rate and rhythm, no murmurs gallops or rub Soft with active bowel sounds No clubbing cyanosis  Edema Alert and oriented, grossly normal motor and sensory function Skin Warm and Dry  Atrial pacing with left bundle branch block  Assessment and  Plan  Sinus node dysfunction  Pacemaker-Medtronic The patient's device was interrogated.  The information was reviewed. No changes were made in the programming.     Nonischemic cardiomyopathy-resolved  Continue current medications.

## 2013-10-23 NOTE — Patient Instructions (Signed)
Your physician recommends that you continue on your current medications as directed. Please refer to the Current Medication list given to you today.  Remote monitoring is used to monitor your Pacemaker of ICD from home. This monitoring reduces the number of office visits required to check your device to one time per year. It allows us to keep an eye on the functioning of your device to ensure it is working properly. You are scheduled for a device check from home on 01/26/14. You may send your transmission at any time that day. If you have a wireless device, the transmission will be sent automatically. After your physician reviews your transmission, you will receive a postcard with your next transmission date.  Your physician wants you to follow-up in: 1 year with Dr. Klein.  You will receive a reminder letter in the mail two months in advance. If you don't receive a letter, please call our office to schedule the follow-up appointment.  

## 2013-10-31 ENCOUNTER — Encounter: Payer: Self-pay | Admitting: Internal Medicine

## 2013-11-03 ENCOUNTER — Encounter: Payer: Self-pay | Admitting: Internal Medicine

## 2013-11-11 ENCOUNTER — Other Ambulatory Visit: Payer: Self-pay | Admitting: Internal Medicine

## 2013-11-19 DIAGNOSIS — Z7901 Long term (current) use of anticoagulants: Secondary | ICD-10-CM | POA: Diagnosis not present

## 2013-11-19 DIAGNOSIS — I2699 Other pulmonary embolism without acute cor pulmonale: Secondary | ICD-10-CM | POA: Diagnosis not present

## 2013-11-19 DIAGNOSIS — I82409 Acute embolism and thrombosis of unspecified deep veins of unspecified lower extremity: Secondary | ICD-10-CM | POA: Diagnosis not present

## 2013-12-18 DIAGNOSIS — I2699 Other pulmonary embolism without acute cor pulmonale: Secondary | ICD-10-CM | POA: Diagnosis not present

## 2013-12-18 DIAGNOSIS — Z7901 Long term (current) use of anticoagulants: Secondary | ICD-10-CM | POA: Diagnosis not present

## 2013-12-18 DIAGNOSIS — I82409 Acute embolism and thrombosis of unspecified deep veins of unspecified lower extremity: Secondary | ICD-10-CM | POA: Diagnosis not present

## 2014-01-13 ENCOUNTER — Other Ambulatory Visit (HOSPITAL_COMMUNITY): Payer: Self-pay | Admitting: Internal Medicine

## 2014-01-16 ENCOUNTER — Encounter (HOSPITAL_COMMUNITY): Payer: Self-pay | Admitting: Vascular Surgery

## 2014-01-21 DIAGNOSIS — Z7901 Long term (current) use of anticoagulants: Secondary | ICD-10-CM | POA: Diagnosis not present

## 2014-01-21 DIAGNOSIS — I82409 Acute embolism and thrombosis of unspecified deep veins of unspecified lower extremity: Secondary | ICD-10-CM | POA: Diagnosis not present

## 2014-01-21 DIAGNOSIS — I2699 Other pulmonary embolism without acute cor pulmonale: Secondary | ICD-10-CM | POA: Diagnosis not present

## 2014-01-26 ENCOUNTER — Telehealth: Payer: Self-pay | Admitting: Cardiology

## 2014-01-26 ENCOUNTER — Encounter: Payer: Medicare Other | Admitting: *Deleted

## 2014-01-26 NOTE — Telephone Encounter (Signed)
LMOVM reminding pt to send remote transmission.   

## 2014-01-27 ENCOUNTER — Encounter: Payer: Self-pay | Admitting: Cardiology

## 2014-02-12 ENCOUNTER — Ambulatory Visit (INDEPENDENT_AMBULATORY_CARE_PROVIDER_SITE_OTHER): Payer: Medicare Other | Admitting: *Deleted

## 2014-02-12 DIAGNOSIS — I495 Sick sinus syndrome: Secondary | ICD-10-CM

## 2014-02-12 LAB — MDC_IDC_ENUM_SESS_TYPE_REMOTE
Battery Remaining Longevity: 9 mo
Brady Statistic AP VP Percent: 0 %
Brady Statistic AP VS Percent: 99 %
Brady Statistic AS VP Percent: 0 %
Brady Statistic AS VS Percent: 1 %
Date Time Interrogation Session: 20150827233409
Lead Channel Impedance Value: 633 Ohm
Lead Channel Impedance Value: 691 Ohm
Lead Channel Pacing Threshold Amplitude: 0.5 V
Lead Channel Pacing Threshold Pulse Width: 0.4 ms
Lead Channel Sensing Intrinsic Amplitude: 5.6 mV
Lead Channel Setting Pacing Amplitude: 1.5 V
Lead Channel Setting Sensing Sensitivity: 2 mV
MDC IDC MSMT BATTERY IMPEDANCE: 4542 Ohm
MDC IDC MSMT BATTERY VOLTAGE: 2.64 V
MDC IDC MSMT LEADCHNL RA PACING THRESHOLD AMPLITUDE: 0.5 V
MDC IDC MSMT LEADCHNL RV PACING THRESHOLD PULSEWIDTH: 0.4 ms
MDC IDC SET LEADCHNL RV PACING AMPLITUDE: 2 V
MDC IDC SET LEADCHNL RV PACING PULSEWIDTH: 0.4 ms

## 2014-02-13 NOTE — Progress Notes (Signed)
Remote pacemaker transmission.   

## 2014-02-25 DIAGNOSIS — I2699 Other pulmonary embolism without acute cor pulmonale: Secondary | ICD-10-CM | POA: Diagnosis not present

## 2014-02-25 DIAGNOSIS — Z7901 Long term (current) use of anticoagulants: Secondary | ICD-10-CM | POA: Diagnosis not present

## 2014-02-26 ENCOUNTER — Encounter: Payer: Self-pay | Admitting: Cardiology

## 2014-03-04 ENCOUNTER — Ambulatory Visit (HOSPITAL_COMMUNITY)
Admission: RE | Admit: 2014-03-04 | Discharge: 2014-03-04 | Disposition: A | Discharge status: Home / Self Care | Payer: Medicare Other | Source: Ambulatory Visit | Attending: Internal Medicine | Admitting: Internal Medicine

## 2014-03-04 ENCOUNTER — Encounter: Payer: Self-pay | Admitting: Internal Medicine

## 2014-03-04 VITALS — BP 90/52 | HR 65 | Wt 184.8 lb

## 2014-03-04 DIAGNOSIS — Z950 Presence of cardiac pacemaker: Secondary | ICD-10-CM | POA: Insufficient documentation

## 2014-03-04 DIAGNOSIS — M129 Arthropathy, unspecified: Secondary | ICD-10-CM | POA: Diagnosis not present

## 2014-03-04 DIAGNOSIS — E119 Type 2 diabetes mellitus without complications: Secondary | ICD-10-CM | POA: Diagnosis not present

## 2014-03-04 DIAGNOSIS — R5381 Other malaise: Secondary | ICD-10-CM | POA: Insufficient documentation

## 2014-03-04 DIAGNOSIS — I2699 Other pulmonary embolism without acute cor pulmonale: Secondary | ICD-10-CM

## 2014-03-04 DIAGNOSIS — D126 Benign neoplasm of colon, unspecified: Secondary | ICD-10-CM | POA: Insufficient documentation

## 2014-03-04 DIAGNOSIS — R031 Nonspecific low blood-pressure reading: Secondary | ICD-10-CM | POA: Insufficient documentation

## 2014-03-04 DIAGNOSIS — I495 Sick sinus syndrome: Secondary | ICD-10-CM | POA: Diagnosis not present

## 2014-03-04 DIAGNOSIS — R5383 Other fatigue: Secondary | ICD-10-CM

## 2014-03-04 DIAGNOSIS — Z125 Encounter for screening for malignant neoplasm of prostate: Secondary | ICD-10-CM | POA: Diagnosis not present

## 2014-03-04 DIAGNOSIS — K649 Unspecified hemorrhoids: Secondary | ICD-10-CM | POA: Insufficient documentation

## 2014-03-04 DIAGNOSIS — I447 Left bundle-branch block, unspecified: Secondary | ICD-10-CM | POA: Diagnosis not present

## 2014-03-04 DIAGNOSIS — N529 Male erectile dysfunction, unspecified: Secondary | ICD-10-CM | POA: Diagnosis not present

## 2014-03-04 DIAGNOSIS — Z79899 Other long term (current) drug therapy: Secondary | ICD-10-CM | POA: Diagnosis not present

## 2014-03-04 DIAGNOSIS — I428 Other cardiomyopathies: Secondary | ICD-10-CM

## 2014-03-04 DIAGNOSIS — E785 Hyperlipidemia, unspecified: Secondary | ICD-10-CM | POA: Diagnosis not present

## 2014-03-04 DIAGNOSIS — Z86711 Personal history of pulmonary embolism: Secondary | ICD-10-CM | POA: Diagnosis not present

## 2014-03-04 DIAGNOSIS — I4892 Unspecified atrial flutter: Secondary | ICD-10-CM | POA: Insufficient documentation

## 2014-03-04 DIAGNOSIS — Z9889 Other specified postprocedural states: Secondary | ICD-10-CM | POA: Insufficient documentation

## 2014-03-04 DIAGNOSIS — Z7901 Long term (current) use of anticoagulants: Secondary | ICD-10-CM | POA: Diagnosis not present

## 2014-03-04 DIAGNOSIS — I10 Essential (primary) hypertension: Secondary | ICD-10-CM | POA: Diagnosis not present

## 2014-03-04 MED ORDER — LISINOPRIL 10 MG PO TABS: 10.0000 mg | ORAL_TABLET | Freq: Every day | ORAL | Status: DC

## 2014-03-04 NOTE — Progress Notes (Signed)
Patient ID: Steven Macdonald, male   DOB: 1934-08-27, 79 y.o.   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  In 3/12 EF down a bit to 20-25% with significant LV dysynchrony with moderate MR and mod LAE.  However repeat echo 6/12 read as 50-55%. Reviewed EF probably more in the 40-45% range with dyssynchrony Echo 07/17/13 EF 45-50%  Follow up:  Remains very active walking 2 miles per day without any symptoms. Over past 2 days has several episodes where BP 90/50. Mildly lethargic. Previously BP 100/60. No orthopnea, PND or edema. Weight stable. No further CP after taking PPI for short period.      ROS: All systems negative except as listed in HPI, PMH and Problem List.  Past Medical History  Diagnosis Date  . HLD (hyperlipidemia)   . HTN (hypertension)   . Pulmonary embolism   . Cardiomyopathy, nonischemic -resolved     Echo 2008: EF 25% Cardiac CT 2009: Normal cors EF 31% Echo 3/10 EF 45% Echo 6/13: EF 50-55%    . LBBB (left bundle branch block)   . Sinus node dysfunction        . ED (erectile dysfunction)   . Hemorrhoids   . Atrial flutter     RFCA   . Arthritis   . Pacemaker  Medtronic     DOI 2/06  . Colon polyps     hyperplastic    Current Outpatient Prescriptions  Medication Sig Dispense Refill  . bisoprolol (ZEBETA) 10 MG tablet TAKE 1 TABLET ONCE DAILY.  30 tablet  3  . cholecalciferol (VITAMIN D) 1000 UNITS tablet Take 1,000 Units by mouth daily.      . fish oil-omega-3 fatty acids 1000 MG capsule Take 1 tablet by mouth daily      . lisinopril (PRINIVIL,ZESTRIL) 10 MG tablet TAKE 2 TABLET DAILY.      Marland Kitchen Psyllium (METAMUCIL MULTIHEALTH FIBER PO) Take by mouth daily.       .  sildenafil (VIAGRA) 100 MG tablet Take 100 mg by mouth daily as needed.        . simvastatin (ZOCOR) 40 MG tablet Take 20 mg by mouth 2 (two) times daily.       Marland Kitchen warfarin (COUMADIN) 7.5 MG tablet Take 7.5 mg by mouth daily.       No current facility-administered medications for this encounter.   Filed Vitals:   03/04/14 1356  BP: 90/52  Pulse: 65  Weight: 184 lb 12 oz (83.802 kg)  SpO2: 96%     PHYSICAL EXAM: General:  Well appearing. No resp difficulty HEENT: normal Neck: supple. JVP flat. Carotids 2+ bilaterally; no bruits. No lymphadenopathy or thryomegaly appreciated. Cor: PMI normal. Regular rate & rhythm. No rubs, gallops or murmurs. Lungs: clear Abdomen: soft, nontender, nondistended. No hepatosplenomegaly. No bruits or masses. Good bowel sounds. Extremities: no cyanosis, clubbing, rash, edema Neuro: alert & orientedx3, cranial nerves grossly intact. Moves all 4 extremities w/o difficulty. Affect pleasant.   ASSESSMENT & PLAN:  1. Nonischemic CM 2. LBBB 3. Symptomatic bradycardia s/p PPM 4. H/o PE on coumadin 5. Low BP  Doing very well. NYHA I. Volume status looks good. BP now low for over 2 weeks. Will cut  lisinopril to 10 mg daily and see if that improves his BP. He is on warfarin for long-term prophylaxis of recurrent PE. We discussed the recent AMPLIFY-EXT trial with Eliquis 2.5 bid and I suggested he consider switching to this. He will discuss further with Dr. Joylene Draft.   Saquan Furtick,MD 2:12 PM

## 2014-03-04 NOTE — Addendum Note (Signed)
Encounter addended by: Scarlette Calico, RN on: 03/04/2014  2:26 PM<BR>     Documentation filed: Patient Instructions Section, Orders

## 2014-03-04 NOTE — Patient Instructions (Signed)
Lisinopril 10 mg daily  We will contact you in 6 months to schedule your next appointment.

## 2014-03-05 ENCOUNTER — Encounter: Payer: Self-pay | Admitting: Internal Medicine

## 2014-03-16 DIAGNOSIS — I498 Other specified cardiac arrhythmias: Secondary | ICD-10-CM | POA: Diagnosis not present

## 2014-03-16 DIAGNOSIS — Z7901 Long term (current) use of anticoagulants: Secondary | ICD-10-CM | POA: Diagnosis not present

## 2014-03-16 DIAGNOSIS — I2699 Other pulmonary embolism without acute cor pulmonale: Secondary | ICD-10-CM | POA: Diagnosis not present

## 2014-03-16 DIAGNOSIS — M5137 Other intervertebral disc degeneration, lumbosacral region: Secondary | ICD-10-CM | POA: Diagnosis not present

## 2014-03-16 DIAGNOSIS — Z1331 Encounter for screening for depression: Secondary | ICD-10-CM | POA: Diagnosis not present

## 2014-03-16 DIAGNOSIS — M5030 Other cervical disc degeneration, unspecified cervical region: Secondary | ICD-10-CM | POA: Diagnosis not present

## 2014-03-16 DIAGNOSIS — Z0000 Encounter for general adult medical examination without abnormal findings: Secondary | ICD-10-CM | POA: Diagnosis not present

## 2014-03-16 DIAGNOSIS — I509 Heart failure, unspecified: Secondary | ICD-10-CM | POA: Diagnosis not present

## 2014-03-16 DIAGNOSIS — Z1212 Encounter for screening for malignant neoplasm of rectum: Secondary | ICD-10-CM | POA: Diagnosis not present

## 2014-03-16 DIAGNOSIS — E119 Type 2 diabetes mellitus without complications: Secondary | ICD-10-CM | POA: Diagnosis not present

## 2014-03-16 DIAGNOSIS — I82409 Acute embolism and thrombosis of unspecified deep veins of unspecified lower extremity: Secondary | ICD-10-CM | POA: Diagnosis not present

## 2014-03-16 DIAGNOSIS — Z23 Encounter for immunization: Secondary | ICD-10-CM | POA: Diagnosis not present

## 2014-04-13 ENCOUNTER — Telehealth: Payer: Self-pay | Admitting: Cardiology

## 2014-04-13 ENCOUNTER — Encounter: Payer: Medicare Other | Admitting: *Deleted

## 2014-04-13 NOTE — Telephone Encounter (Signed)
LMOVM reminding pt to send remote transmission.   

## 2014-04-20 ENCOUNTER — Encounter: Payer: Self-pay | Admitting: Cardiology

## 2014-04-24 ENCOUNTER — Encounter: Payer: Medicare Other | Admitting: *Deleted

## 2014-05-01 DIAGNOSIS — N401 Enlarged prostate with lower urinary tract symptoms: Secondary | ICD-10-CM | POA: Diagnosis not present

## 2014-05-01 DIAGNOSIS — R3912 Poor urinary stream: Secondary | ICD-10-CM | POA: Diagnosis not present

## 2014-05-01 DIAGNOSIS — N5201 Erectile dysfunction due to arterial insufficiency: Secondary | ICD-10-CM | POA: Diagnosis not present

## 2014-05-05 DIAGNOSIS — H2513 Age-related nuclear cataract, bilateral: Secondary | ICD-10-CM | POA: Diagnosis not present

## 2014-05-06 ENCOUNTER — Telehealth: Payer: Self-pay | Admitting: Internal Medicine

## 2014-05-06 NOTE — Telephone Encounter (Signed)
New message           C/o dizziness & sob when on the treadmill / is this due to him needing a battery change / pt feels pretty good right now / pt says this is not an emergency

## 2014-05-07 DIAGNOSIS — Z7901 Long term (current) use of anticoagulants: Secondary | ICD-10-CM | POA: Diagnosis not present

## 2014-05-07 DIAGNOSIS — I8290 Acute embolism and thrombosis of unspecified vein: Secondary | ICD-10-CM | POA: Diagnosis not present

## 2014-05-07 DIAGNOSIS — I2699 Other pulmonary embolism without acute cor pulmonale: Secondary | ICD-10-CM | POA: Diagnosis not present

## 2014-05-08 ENCOUNTER — Ambulatory Visit (INDEPENDENT_AMBULATORY_CARE_PROVIDER_SITE_OTHER): Payer: Medicare Other | Admitting: *Deleted

## 2014-05-08 DIAGNOSIS — I495 Sick sinus syndrome: Secondary | ICD-10-CM

## 2014-05-08 LAB — MDC_IDC_ENUM_SESS_TYPE_INCLINIC
Battery Impedance: 6504 Ohm
Battery Voltage: 2.61 V
Date Time Interrogation Session: 20151120104315
Lead Channel Impedance Value: 67 Ohm
Lead Channel Setting Pacing Amplitude: 2 V
Lead Channel Setting Pacing Pulse Width: 0.4 ms
MDC IDC MSMT LEADCHNL RV IMPEDANCE VALUE: 682 Ohm
MDC IDC SET LEADCHNL RV SENSING SENSITIVITY: 2.8 mV
MDC IDC STAT BRADY RV PERCENT PACED: 0 %

## 2014-05-08 NOTE — Progress Notes (Signed)
Pacemaker check in clinic for pt symptoms (N/C). ERI reached on 11/17---reverted to VVI 65. No VS seen at 50bpm so unable to turn on single chamber hysteresis. No episodes noted. No further testing performed. Previous lead measurements WNL. Patient will F/U w/SK on 11-23 @ 8:15 to discuss gen change.

## 2014-05-11 ENCOUNTER — Encounter: Payer: Self-pay | Admitting: *Deleted

## 2014-05-11 ENCOUNTER — Ambulatory Visit (INDEPENDENT_AMBULATORY_CARE_PROVIDER_SITE_OTHER): Payer: Medicare Other | Admitting: Internal Medicine

## 2014-05-11 ENCOUNTER — Encounter: Payer: Self-pay | Admitting: Internal Medicine

## 2014-05-11 VITALS — BP 86/52 | HR 65 | Ht 75.5 in | Wt 193.0 lb

## 2014-05-11 DIAGNOSIS — Z01812 Encounter for preprocedural laboratory examination: Secondary | ICD-10-CM

## 2014-05-11 DIAGNOSIS — Z45010 Encounter for checking and testing of cardiac pacemaker pulse generator [battery]: Secondary | ICD-10-CM

## 2014-05-11 DIAGNOSIS — I495 Sick sinus syndrome: Secondary | ICD-10-CM | POA: Diagnosis not present

## 2014-05-11 LAB — BASIC METABOLIC PANEL
BUN: 25 mg/dL — AB (ref 6–23)
CHLORIDE: 103 meq/L (ref 96–112)
CO2: 26 mEq/L (ref 19–32)
CREATININE: 1.4 mg/dL (ref 0.4–1.5)
Calcium: 9.6 mg/dL (ref 8.4–10.5)
GFR: 54 mL/min — ABNORMAL LOW (ref >60.00)
Glucose, Bld: 62 mg/dL — ABNORMAL LOW (ref 70–99)
POTASSIUM: 4.6 meq/L (ref 3.5–5.1)
Sodium: 138 mEq/L (ref 135–145)

## 2014-05-11 LAB — CBC WITH DIFFERENTIAL/PLATELET
BASOS PCT: 0.5 % (ref 0.0–3.0)
Basophils Absolute: 0 10*3/uL (ref 0.0–0.1)
Eosinophils Absolute: 0.3 10*3/uL (ref 0.0–0.7)
Eosinophils Relative: 4.4 % (ref 0.0–5.0)
HCT: 44.7 % (ref 39.0–52.0)
HEMOGLOBIN: 14.3 g/dL (ref 13.0–17.0)
LYMPHS PCT: 40 % (ref 12.0–46.0)
Lymphs Abs: 2.8 10*3/uL (ref 0.7–4.0)
MCHC: 32 g/dL (ref 30.0–36.0)
MCV: 88.1 fl (ref 78.0–100.0)
MONOS PCT: 8.4 % (ref 3.0–12.0)
Monocytes Absolute: 0.6 10*3/uL (ref 0.1–1.0)
NEUTROS PCT: 46.7 % (ref 43.0–77.0)
Neutro Abs: 3.3 10*3/uL (ref 1.4–7.7)
Platelets: 125 10*3/uL — ABNORMAL LOW (ref 150.0–400.0)
RBC: 5.07 Mil/uL (ref 4.22–5.81)
RDW: 13.9 % (ref 11.5–15.5)
WBC: 7 10*3/uL (ref 4.0–10.5)

## 2014-05-11 LAB — PROTIME-INR
INR: 2.2 ratio — ABNORMAL HIGH (ref 0.8–1.0)
Prothrombin Time: 24.2 s — ABNORMAL HIGH (ref 9.6–13.1)

## 2014-05-11 NOTE — Progress Notes (Signed)
He would like to      Patient Care Team: Jerlyn Ly, MD as PCP - General   HPI  Steven Macdonald is a 79 y.o. male Seen in followup for AFlutter s/p RFCA He has had no recurrent arrhythmia.  He has a previously implanted dual-chamber Medtronic pacemaker sinus node dysfunction  His device reverted to VVI. Exercise tolerance is markedly impaired   He was seen in the heart failure clinic 12/13. There have been variable reports a left ventricular function initially in the 25% range with interval improvement. Echocardiogram 1/15 demonstrated an EF of 45-50%. It had been in the 45-55% range now for a couple of years.   He has a history of pulmonary embolism and is on Coumadin.  He is quite active.   Past Medical History  Diagnosis Date  . HLD (hyperlipidemia)   . HTN (hypertension)   . Pulmonary embolism   . Cardiomyopathy, nonischemic -resolved     Echo 2008: EF 25% Cardiac CT 2009: Normal cors EF 31% Echo 3/10 EF 45% Echo 6/13: EF 50-55%    . LBBB (left bundle branch block)   . Sinus node dysfunction        . ED (erectile dysfunction)   . Hemorrhoids   . Atrial flutter     RFCA   . Arthritis   . Pacemaker  Medtronic     DOI 2/06  . Colon polyps     hyperplastic    Past Surgical History  Procedure Laterality Date  . Pacemaker insertion    . Cystoscopy w/ transurethral resection of posterior uretheral valves      Current Outpatient Prescriptions  Medication Sig Dispense Refill  . bisoprolol (ZEBETA) 10 MG tablet TAKE 1 TABLET ONCE DAILY. 30 tablet 3  . cholecalciferol (VITAMIN D) 1000 UNITS tablet Take 1,000 Units by mouth daily.    . fish oil-omega-3 fatty acids 1000 MG capsule Take 1 tablet by mouth daily    . lisinopril (PRINIVIL,ZESTRIL) 10 MG tablet Take 1 tablet (10 mg total) by mouth daily.    . Psyllium (METAMUCIL MULTIHEALTH FIBER PO) Take by mouth daily.     . sildenafil (VIAGRA) 100 MG tablet Take 100 mg by mouth daily as needed.      . simvastatin  (ZOCOR) 40 MG tablet Take 20 mg by mouth 2 (two) times daily.     Marland Kitchen warfarin (COUMADIN) 7.5 MG tablet Take 7.5 mg by mouth daily.     No current facility-administered medications for this visit.    No Known Allergies  Review of Systems negative except from HPI and PMH  Physical Exam BP 86/52 mmHg  Pulse 65  Ht 6' 3.5" (1.918 m)  Wt 87.544 kg (193 lb)  BMI 23.80 kg/m2 Well developed and well nourished in no acute distress HENT normal E scleral and icterus clear Neck Supple JVP flat; carotids brisk and full Clear to ausculation  Regular rate and rhythm, no murmurs gallops or rub Soft with active bowel sounds No clubbing cyanosis  Edema Alert and oriented, grossly normal motor and sensory function Skin Warm and Dry  Atrial pacing with left bundle branch block  Assessment and  Plan  Sinus node dysfunction  Hypotension  Pacemaker-Medtronic The patient's device was interrogated.  The information was reviewed. No changes were made in the programming.     Nonischemic cardiomyopathy-resolved   His device has reached ERI. As he has no sinus rhythm, we are unable to reprogram his device as it does  not have hysteresis.  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 We will hold his lisinopril and cut his bisoprolol in half between now and his procedure. He has a history of pulmonary embolism. We will not discontinue his Coumadin most recent was 1.9  We have reviewed the benefits and risks of generator replacement.  These include but are not limited to lead fracture and infection.  The patient understands, agrees and is willing to proceed.    His sinus node dysfunction is permanent and irreversible

## 2014-05-11 NOTE — Patient Instructions (Signed)
Your physician recommends that you continue on your current medications as directed. Please refer to the Current Medication list given to you today.  Pre-procedure lab today: BMET, CBCD  Your physician has recommended that you have a pacemaker generator change.  Please see the instruction sheet given to you today for more information.  Your wound check is scheduled for 05/25/14 at 4:30 p.m.  at [address]

## 2014-05-12 DIAGNOSIS — M1990 Unspecified osteoarthritis, unspecified site: Secondary | ICD-10-CM | POA: Diagnosis not present

## 2014-05-12 DIAGNOSIS — I509 Heart failure, unspecified: Secondary | ICD-10-CM | POA: Diagnosis not present

## 2014-05-12 DIAGNOSIS — Z950 Presence of cardiac pacemaker: Secondary | ICD-10-CM | POA: Diagnosis not present

## 2014-05-12 DIAGNOSIS — E785 Hyperlipidemia, unspecified: Secondary | ICD-10-CM | POA: Diagnosis not present

## 2014-05-12 DIAGNOSIS — I447 Left bundle-branch block, unspecified: Secondary | ICD-10-CM | POA: Diagnosis not present

## 2014-05-12 DIAGNOSIS — Z86711 Personal history of pulmonary embolism: Secondary | ICD-10-CM | POA: Diagnosis not present

## 2014-05-12 DIAGNOSIS — I429 Cardiomyopathy, unspecified: Secondary | ICD-10-CM | POA: Diagnosis not present

## 2014-05-12 DIAGNOSIS — I10 Essential (primary) hypertension: Secondary | ICD-10-CM | POA: Diagnosis not present

## 2014-05-12 DIAGNOSIS — I4892 Unspecified atrial flutter: Secondary | ICD-10-CM | POA: Diagnosis not present

## 2014-05-12 DIAGNOSIS — I495 Sick sinus syndrome: Secondary | ICD-10-CM | POA: Diagnosis present

## 2014-05-12 MED ORDER — SODIUM CHLORIDE 0.9 % IR SOLN
80.0000 mg | Status: AC
  Filled 2014-05-12: qty 2

## 2014-05-12 MED ORDER — CEFAZOLIN SODIUM-DEXTROSE 2-3 GM-% IV SOLR: 2.0000 g | INTRAVENOUS | Status: AC

## 2014-05-12 MED ORDER — CHLORHEXIDINE GLUCONATE 4 % EX LIQD
60.0000 mL | Freq: Once | CUTANEOUS | Status: DC
  Filled 2014-05-12: qty 60

## 2014-05-12 MED ORDER — SODIUM CHLORIDE 0.9 % IV SOLN
INTRAVENOUS | Status: DC
  Administered 2014-05-13: 12:00:00 via INTRAVENOUS

## 2014-05-13 ENCOUNTER — Encounter: Payer: Self-pay | Admitting: Internal Medicine

## 2014-05-13 ENCOUNTER — Encounter (HOSPITAL_COMMUNITY)
Admission: RE | Disposition: A | Discharge status: Home / Self Care | Payer: Self-pay | Source: Ambulatory Visit | Attending: Internal Medicine

## 2014-05-13 ENCOUNTER — Ambulatory Visit (HOSPITAL_COMMUNITY)
Admission: RE | Admit: 2014-05-13 | Discharge: 2014-05-13 | Disposition: A | Discharge status: Home / Self Care | Payer: Medicare Other | Source: Ambulatory Visit | Attending: Internal Medicine | Admitting: Internal Medicine

## 2014-05-13 DIAGNOSIS — Z86711 Personal history of pulmonary embolism: Secondary | ICD-10-CM | POA: Insufficient documentation

## 2014-05-13 DIAGNOSIS — I429 Cardiomyopathy, unspecified: Secondary | ICD-10-CM | POA: Insufficient documentation

## 2014-05-13 DIAGNOSIS — I495 Sick sinus syndrome: Secondary | ICD-10-CM | POA: Diagnosis not present

## 2014-05-13 DIAGNOSIS — Z950 Presence of cardiac pacemaker: Secondary | ICD-10-CM | POA: Diagnosis present

## 2014-05-13 DIAGNOSIS — I10 Essential (primary) hypertension: Secondary | ICD-10-CM | POA: Insufficient documentation

## 2014-05-13 DIAGNOSIS — I447 Left bundle-branch block, unspecified: Secondary | ICD-10-CM | POA: Insufficient documentation

## 2014-05-13 DIAGNOSIS — I509 Heart failure, unspecified: Secondary | ICD-10-CM | POA: Insufficient documentation

## 2014-05-13 DIAGNOSIS — I4892 Unspecified atrial flutter: Secondary | ICD-10-CM | POA: Insufficient documentation

## 2014-05-13 DIAGNOSIS — M1990 Unspecified osteoarthritis, unspecified site: Secondary | ICD-10-CM | POA: Insufficient documentation

## 2014-05-13 DIAGNOSIS — E785 Hyperlipidemia, unspecified: Secondary | ICD-10-CM | POA: Insufficient documentation

## 2014-05-13 HISTORY — PX: PACEMAKER GENERATOR CHANGE: SHX5481

## 2014-05-13 LAB — SURGICAL PCR SCREEN
MRSA, PCR: NEGATIVE
STAPHYLOCOCCUS AUREUS: NEGATIVE

## 2014-05-13 LAB — PROTIME-INR
INR: 1.99 — ABNORMAL HIGH (ref 0.00–1.49)
Prothrombin Time: 22.7 seconds — ABNORMAL HIGH (ref 11.6–15.2)

## 2014-05-13 SURGERY — PACEMAKER GENERATOR CHANGE
Anesthesia: LOCAL

## 2014-05-13 MED ORDER — ACETAMINOPHEN 325 MG PO TABS
325.0000 mg | ORAL_TABLET | ORAL | Status: DC | PRN
  Filled 2014-05-13: qty 2

## 2014-05-13 MED ORDER — MUPIROCIN 2 % EX OINT
1.0000 "application " | TOPICAL_OINTMENT | Freq: Once | CUTANEOUS | Status: AC
  Administered 2014-05-13: 1 via TOPICAL
  Filled 2014-05-13: qty 22

## 2014-05-13 MED ORDER — FENTANYL CITRATE 0.05 MG/ML IJ SOLN
INTRAMUSCULAR | Status: AC
  Filled 2014-05-13: qty 2

## 2014-05-13 MED ORDER — SODIUM CHLORIDE 0.9 % IV SOLN: INTRAVENOUS | Status: DC

## 2014-05-13 MED ORDER — MIDAZOLAM HCL 5 MG/5ML IJ SOLN
INTRAMUSCULAR | Status: AC
  Filled 2014-05-13: qty 5

## 2014-05-13 MED ORDER — ONDANSETRON HCL 4 MG/2ML IJ SOLN
4.0000 mg | Freq: Four times a day (QID) | INTRAMUSCULAR | Status: DC | PRN
Start: 2014-05-13 — End: 2014-05-13

## 2014-05-13 MED ORDER — MUPIROCIN 2 % EX OINT
TOPICAL_OINTMENT | CUTANEOUS | Status: AC
Start: 2014-05-13 — End: 2014-05-13
  Administered 2014-05-13: 1 via TOPICAL
  Filled 2014-05-13: qty 22

## 2014-05-13 MED ORDER — LIDOCAINE HCL (PF) 1 % IJ SOLN
INTRAMUSCULAR | Status: AC
  Filled 2014-05-13: qty 60

## 2014-05-13 NOTE — CV Procedure (Signed)
Preoperative diagnosis sinus node dysfunction  Postoperative diagnosis same/   Procedure: Generator replacement  Pocket revision  Following informed consent the patient was brought to the electrophysiology laboratory in place of the fluoroscopic table in the supine position after routine prep and drape lidocaine was infiltrated in the region of the previous incision and carried down to later the device pocket using sharp dissection and electrocautery. The pocket was opened the device was freed up and was explanted.  Interrogation of the previously implanted ventricular lead Medtronic MRI compatible 5076  demonstrated an R wave of N/A  Millivolts.Ventricular lead was not assessed during the procedure.   The previously implanted atrial lead Medtronic MRI compatible 5076 demonstrated a P-wave amplitude of NONE milllivolts  and impedance of  509 ohms, and a pacing threshold of 0.9 volts at  @ 0.92milliseconds.  The leads were inspected. The leads were then attached to a Medtronic MRI compatible ADVISA pulse generator, serial number [id number].    The pocket was irrigated with antibiotic containing saline solution hemostasis was assured and the leads and the device were placed in the pocket. The wound was then closed in 2 layers in normal fashion. Dermabond was appllied   EBL minimal   Pocket was revised cephalad and caudal to allow for insertion of the larger pulse generator  The patient tolerated the procedure without apparent complication.  Steven Macdonald

## 2014-05-13 NOTE — H&P (View-Only) (Signed)
He would like to      Patient Care Team: Jerlyn Ly, MD as PCP - General   HPI  Steven Macdonald is a 79 y.o. male Seen in followup for AFlutter s/p RFCA He has had no recurrent arrhythmia.  He has a previously implanted dual-chamber Medtronic pacemaker sinus node dysfunction  His device reverted to VVI. Exercise tolerance is markedly impaired   He was seen in the heart failure clinic 12/13. There have been variable reports a left ventricular function initially in the 25% range with interval improvement. Echocardiogram 1/15 demonstrated an EF of 45-50%. It had been in the 45-55% range now for a couple of years.   He has a history of pulmonary embolism and is on Coumadin.  He is quite active.   Past Medical History  Diagnosis Date  . HLD (hyperlipidemia)   . HTN (hypertension)   . Pulmonary embolism   . Cardiomyopathy, nonischemic -resolved     Echo 2008: EF 25% Cardiac CT 2009: Normal cors EF 31% Echo 3/10 EF 45% Echo 6/13: EF 50-55%    . LBBB (left bundle branch block)   . Sinus node dysfunction        . ED (erectile dysfunction)   . Hemorrhoids   . Atrial flutter     RFCA   . Arthritis   . Pacemaker  Medtronic     DOI 2/06  . Colon polyps     hyperplastic    Past Surgical History  Procedure Laterality Date  . Pacemaker insertion    . Cystoscopy w/ transurethral resection of posterior uretheral valves      Current Outpatient Prescriptions  Medication Sig Dispense Refill  . bisoprolol (ZEBETA) 10 MG tablet TAKE 1 TABLET ONCE DAILY. 30 tablet 3  . cholecalciferol (VITAMIN D) 1000 UNITS tablet Take 1,000 Units by mouth daily.    . fish oil-omega-3 fatty acids 1000 MG capsule Take 1 tablet by mouth daily    . lisinopril (PRINIVIL,ZESTRIL) 10 MG tablet Take 1 tablet (10 mg total) by mouth daily.    . Psyllium (METAMUCIL MULTIHEALTH FIBER PO) Take by mouth daily.     . sildenafil (VIAGRA) 100 MG tablet Take 100 mg by mouth daily as needed.      . simvastatin  (ZOCOR) 40 MG tablet Take 20 mg by mouth 2 (two) times daily.     Marland Kitchen warfarin (COUMADIN) 7.5 MG tablet Take 7.5 mg by mouth daily.     No current facility-administered medications for this visit.    No Known Allergies  Review of Systems negative except from HPI and PMH  Physical Exam BP 86/52 mmHg  Pulse 65  Ht 6' 3.5" (1.918 m)  Wt 87.544 kg (193 lb)  BMI 23.80 kg/m2 Well developed and well nourished in no acute distress HENT normal E scleral and icterus clear Neck Supple JVP flat; carotids brisk and full Clear to ausculation  Regular rate and rhythm, no murmurs gallops or rub Soft with active bowel sounds No clubbing cyanosis  Edema Alert and oriented, grossly normal motor and sensory function Skin Warm and Dry  Atrial pacing with left bundle branch block  Assessment and  Plan  Sinus node dysfunction  Hypotension  Pacemaker-Medtronic The patient's device was interrogated.  The information was reviewed. No changes were made in the programming.     Nonischemic cardiomyopathy-resolved   His device has reached ERI. As he has no sinus rhythm, we are unable to reprogram his device as it does  not have hysteresis.  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 We will hold his lisinopril and cut his bisoprolol in half between now and his procedure. He has a history of pulmonary embolism. We will not discontinue his Coumadin most recent was 1.9  We have reviewed the benefits and risks of generator replacement.  These include but are not limited to lead fracture and infection.  The patient understands, agrees and is willing to proceed.    His sinus node dysfunction is permanent and irreversible

## 2014-05-13 NOTE — Discharge Instructions (Signed)
Pacemaker Battery Change, Care After °Refer to this sheet in the next few weeks. These instructions provide you with information on caring for yourself after your procedure. Your health care provider may also give you more specific instructions. Your treatment has been planned according to current medical practices, but problems sometimes occur. Call your health care provider if you have any problems or questions after your procedure. °WHAT TO EXPECT AFTER THE PROCEDURE °After your procedure, it is typical to have the following sensations: °· Soreness at the pacemaker site. °HOME CARE INSTRUCTIONS  °· Keep the incision clean and dry. °· Unless advised otherwise, you may shower beginning 48 hours after your procedure. °· For the first week after the replacement, avoid stretching motions that pull at the incision site, and avoid heavy exercise with the arm that is on the same side as the incision. °· Take medicines only as directed by your health care provider. °· Keep all follow-up visits as directed by your health care provider. °SEEK MEDICAL CARE IF:  °· You have pain at the incision site that is not relieved by over-the-counter or prescription medicine. °· There is drainage or pus from the incision site. °· There is swelling larger than a lime at the incision site. °· You develop red streaking that extends above or below the incision site. °· You feel brief, intermittent palpitations, light-headedness, or any symptoms that you feel might be related to your heart. °SEEK IMMEDIATE MEDICAL CARE IF:  °· You experience chest pain that is different than the pain at the pacemaker site. °· You experience shortness of breath. °· You have palpitations or irregular heartbeat. °· You have light-headedness that does not go away quickly. °· You faint. °· You have pain that gets worse and is not relieved by medicine. °Document Released: 03/26/2013 Document Revised: 10/20/2013 Document Reviewed: 03/26/2013 °ExitCare® Patient  Information ©2015 [employer]. This information is not intended to replace advice given to you by your health care provider. Make sure you discuss any questions you have with your health care provider. ° °

## 2014-05-13 NOTE — Interval H&P Note (Signed)
History and Physical Interval Note:  05/13/2014 1:30 PM  Steven Macdonald  has presented today for surgery, with the diagnosis of eri  The various methods of treatment have been discussed with the patient and family. After consideration of risks, benefits and other options for treatment, the patient has consented to  Procedure(s): PACEMAKER GENERATOR CHANGE (N/A) as a surgical intervention .  The patient's history has been reviewed, patient examined, no change in status, stable for surgery.  I have reviewed the patient's chart and labs.  Questions were answered to the patient's satisfaction.     Virl Axe

## 2014-05-25 ENCOUNTER — Ambulatory Visit (INDEPENDENT_AMBULATORY_CARE_PROVIDER_SITE_OTHER): Payer: Medicare Other | Admitting: *Deleted

## 2014-05-25 DIAGNOSIS — I429 Cardiomyopathy, unspecified: Secondary | ICD-10-CM

## 2014-05-25 DIAGNOSIS — I428 Other cardiomyopathies: Secondary | ICD-10-CM

## 2014-05-25 LAB — MDC_IDC_ENUM_SESS_TYPE_INCLINIC
Battery Voltage: 3.12 V
Brady Statistic AP VP Percent: 0.2 %
Brady Statistic AP VS Percent: 99.31 %
Brady Statistic AS VP Percent: 0 %
Brady Statistic RV Percent Paced: 0.2 %
Date Time Interrogation Session: 20151207223111
Lead Channel Impedance Value: 437 Ohm
Lead Channel Impedance Value: 494 Ohm
Lead Channel Pacing Threshold Amplitude: 0.5 V
Lead Channel Pacing Threshold Pulse Width: 0.4 ms
Lead Channel Sensing Intrinsic Amplitude: 3.125 mV
Lead Channel Sensing Intrinsic Amplitude: 6.25 mV
Lead Channel Setting Pacing Amplitude: 1.5 V
Lead Channel Setting Sensing Sensitivity: 2.8 mV
MDC IDC MSMT LEADCHNL RA IMPEDANCE VALUE: 342 Ohm
MDC IDC MSMT LEADCHNL RA IMPEDANCE VALUE: 456 Ohm
MDC IDC MSMT LEADCHNL RA PACING THRESHOLD AMPLITUDE: 0.5 V
MDC IDC MSMT LEADCHNL RA SENSING INTR AMPL: 3 mV
MDC IDC MSMT LEADCHNL RV PACING THRESHOLD PULSEWIDTH: 0.4 ms
MDC IDC MSMT LEADCHNL RV SENSING INTR AMPL: 6.125 mV
MDC IDC SET LEADCHNL RV PACING AMPLITUDE: 2.5 V
MDC IDC SET LEADCHNL RV PACING PULSEWIDTH: 0.4 ms
MDC IDC STAT BRADY AS VS PERCENT: 0.5 %
MDC IDC STAT BRADY RA PERCENT PACED: 99.5 %
Zone Setting Detection Interval: 400 ms
Zone Setting Detection Interval: 400 ms

## 2014-05-25 NOTE — Progress Notes (Signed)
Wound check pacemaker appt. Normal device function. Wound well healed. F/U on 3-8 @ 4:00 w/SK.

## 2014-05-28 ENCOUNTER — Encounter (HOSPITAL_COMMUNITY): Payer: Self-pay | Admitting: Internal Medicine

## 2014-06-02 ENCOUNTER — Encounter (HOSPITAL_COMMUNITY): Payer: Medicare Other

## 2014-06-17 ENCOUNTER — Encounter: Payer: Self-pay | Admitting: Internal Medicine

## 2014-06-18 DIAGNOSIS — Z7901 Long term (current) use of anticoagulants: Secondary | ICD-10-CM | POA: Diagnosis not present

## 2014-06-18 DIAGNOSIS — I2699 Other pulmonary embolism without acute cor pulmonale: Secondary | ICD-10-CM | POA: Diagnosis not present

## 2014-06-18 DIAGNOSIS — I8290 Acute embolism and thrombosis of unspecified vein: Secondary | ICD-10-CM | POA: Diagnosis not present

## 2014-07-28 ENCOUNTER — Other Ambulatory Visit (HOSPITAL_COMMUNITY): Payer: Self-pay | Admitting: Cardiology

## 2014-07-28 MED ORDER — BISOPROLOL FUMARATE 5 MG PO TABS: 5.0000 mg | ORAL_TABLET | Freq: Every day | ORAL | Status: DC

## 2014-07-30 DIAGNOSIS — Z7901 Long term (current) use of anticoagulants: Secondary | ICD-10-CM | POA: Diagnosis not present

## 2014-07-30 DIAGNOSIS — I8290 Acute embolism and thrombosis of unspecified vein: Secondary | ICD-10-CM | POA: Diagnosis not present

## 2014-07-30 DIAGNOSIS — I2699 Other pulmonary embolism without acute cor pulmonale: Secondary | ICD-10-CM | POA: Diagnosis not present

## 2014-08-03 ENCOUNTER — Telehealth (HOSPITAL_COMMUNITY): Payer: Self-pay | Admitting: Vascular Surgery

## 2014-08-03 NOTE — Telephone Encounter (Signed)
Pt called he has been taking Bisoprolol 10 mg for a while a prescription for 10mg  was called in last week pt wants to know if Dr. Haroldine Laws change the dose on purpose

## 2014-08-04 MED ORDER — BISOPROLOL FUMARATE 10 MG PO TABS: 10.0000 mg | ORAL_TABLET | Freq: Every day | ORAL | Status: DC

## 2014-08-04 NOTE — Telephone Encounter (Signed)
Unsure why medication dose was changed in epic, there is no mention in any notes about decreasing dose, new rx for 10 mg tabs sent to pharmacy, pt is aware

## 2014-08-25 ENCOUNTER — Ambulatory Visit (INDEPENDENT_AMBULATORY_CARE_PROVIDER_SITE_OTHER): Payer: Medicare Other | Admitting: Internal Medicine

## 2014-08-25 ENCOUNTER — Encounter: Payer: Self-pay | Admitting: Internal Medicine

## 2014-08-25 VITALS — BP 100/68 | HR 62 | Ht 75.0 in | Wt 189.8 lb

## 2014-08-25 DIAGNOSIS — Z45018 Encounter for adjustment and management of other part of cardiac pacemaker: Secondary | ICD-10-CM | POA: Diagnosis not present

## 2014-08-25 DIAGNOSIS — I495 Sick sinus syndrome: Secondary | ICD-10-CM | POA: Diagnosis not present

## 2014-08-25 LAB — MDC_IDC_ENUM_SESS_TYPE_INCLINIC
Battery Remaining Longevity: 125 mo
Battery Voltage: 3.05 V
Brady Statistic AP VP Percent: 0.08 %
Brady Statistic AP VS Percent: 99.31 %
Brady Statistic AS VP Percent: 0 %
Date Time Interrogation Session: 20160308162823
Lead Channel Impedance Value: 342 Ohm
Lead Channel Impedance Value: 456 Ohm
Lead Channel Impedance Value: 494 Ohm
Lead Channel Pacing Threshold Amplitude: 0.625 V
Lead Channel Pacing Threshold Pulse Width: 0.4 ms
Lead Channel Pacing Threshold Pulse Width: 0.4 ms
Lead Channel Sensing Intrinsic Amplitude: 2.375 mV
Lead Channel Setting Pacing Amplitude: 2 V
Lead Channel Setting Pacing Pulse Width: 0.4 ms
MDC IDC MSMT LEADCHNL RA PACING THRESHOLD AMPLITUDE: 0.5 V
MDC IDC MSMT LEADCHNL RV IMPEDANCE VALUE: 437 Ohm
MDC IDC MSMT LEADCHNL RV SENSING INTR AMPL: 6.625 mV
MDC IDC SET LEADCHNL RV PACING AMPLITUDE: 2.5 V
MDC IDC SET LEADCHNL RV SENSING SENSITIVITY: 2.8 mV
MDC IDC SET ZONE DETECTION INTERVAL: 400 ms
MDC IDC STAT BRADY AS VS PERCENT: 0.61 %
MDC IDC STAT BRADY RA PERCENT PACED: 99.39 %
MDC IDC STAT BRADY RV PERCENT PACED: 0.08 %
Zone Setting Detection Interval: 400 ms

## 2014-08-25 NOTE — Progress Notes (Signed)
He would like to      Patient Care Team: Crist Infante, MD as PCP - General   HPI  Steven Macdonald is a 80 y.o. male Seen in followup for AFlutter s/p RFCA He has had no recurrent arrhythmia.  He has a previously implanted dual-chamber Medtronic pacemaker sinus node dysfunction  His device reverted to VVI and he underrwent generator repalcement 11/15  He was seen in the heart failure clinic 12/13. There have been variable reports a left ventricular function initially in the 25% range with interval improvement. Echocardiogram 1/15 demonstrated an EF of 45-50%. It had been in the 45-55% range now for a couple of years.   He has a history of pulmonary embolism and is on Coumadin.  He is quite active.   Past Medical History  Diagnosis Date  . HLD (hyperlipidemia)   . HTN (hypertension)   . Pulmonary embolism   . Cardiomyopathy, nonischemic -resolved     Echo 2008: EF 25% Cardiac CT 2009: Normal cors EF 31% Echo 3/10 EF 45% Echo 6/13: EF 50-55%    . LBBB (left bundle branch block)   . Sinus node dysfunction        . ED (erectile dysfunction)   . Hemorrhoids   . Atrial flutter     RFCA   . Arthritis   . Pacemaker  Medtronic     DOI 2/06  . Colon polyps     hyperplastic    Past Surgical History  Procedure Laterality Date  . Pacemaker insertion    . Cystoscopy w/ transurethral resection of posterior uretheral valves    . Pacemaker generator change N/A 05/13/2014    Procedure: PACEMAKER GENERATOR CHANGE;  Surgeon: Deboraha Sprang, MD;  Location: [facility] CATH LAB;  Service: Cardiovascular;  Laterality: N/A;    Current Outpatient Prescriptions  Medication Sig Dispense Refill  . bisoprolol (ZEBETA) 10 MG tablet Take 1 tablet (10 mg total) by mouth daily. 30 tablet 3  . cholecalciferol (VITAMIN D) 1000 UNITS tablet Take 1,000 Units by mouth daily.    . fish oil-omega-3 fatty acids 1000 MG capsule Take 1 tablet by mouth daily    . lisinopril (PRINIVIL,ZESTRIL) 10 MG tablet Take 1  tablet (10 mg total) by mouth daily.    . Psyllium (METAMUCIL MULTIHEALTH FIBER PO) Take by mouth daily.     . sildenafil (VIAGRA) 100 MG tablet Take 100 mg by mouth daily as needed.      . simvastatin (ZOCOR) 40 MG tablet Take 20 mg by mouth 2 (two) times daily.     Marland Kitchen warfarin (COUMADIN) 7.5 MG tablet Take 7.5 mg by mouth daily.     No current facility-administered medications for this visit.    No Known Allergies  Review of Systems negative except from HPI and PMH  Physical Exam BP 100/68 mmHg  Pulse 62  Ht 6\' 3"  (1.905 m)  Wt 189 lb 12.8 oz (86.093 kg)  BMI 23.72 kg/m2 Well developed and well nourished in no acute distress HENT normal E scleral and icterus clear Neck Supple JVP flat; carotids brisk and full Clear to ausculation  Regular rate and rhythm, no murmurs gallops or rub Soft with active bowel sounds No clubbing cyanosis  Edema Alert and oriented, grossly normal motor and sensory function Skin Warm and Dry  Atrial pacing with left bundle branch block  Assessment and  Plan  Sinus node dysfunction  Hypotension  Pacemaker-Medtronic The patient's device was interrogated.  The information was reviewed.  No changes were made in the programming.     Nonischemic cardiomyopathy-resolved

## 2014-09-03 ENCOUNTER — Encounter: Payer: Self-pay | Admitting: Internal Medicine

## 2014-09-15 DIAGNOSIS — Z7901 Long term (current) use of anticoagulants: Secondary | ICD-10-CM | POA: Diagnosis not present

## 2014-09-15 DIAGNOSIS — I2699 Other pulmonary embolism without acute cor pulmonale: Secondary | ICD-10-CM | POA: Diagnosis not present

## 2014-09-15 DIAGNOSIS — I8290 Acute embolism and thrombosis of unspecified vein: Secondary | ICD-10-CM | POA: Diagnosis not present

## 2014-10-14 DIAGNOSIS — H9202 Otalgia, left ear: Secondary | ICD-10-CM | POA: Diagnosis not present

## 2014-10-14 DIAGNOSIS — M2660 Temporomandibular joint disorder, unspecified: Secondary | ICD-10-CM | POA: Diagnosis not present

## 2014-10-27 DIAGNOSIS — Z7901 Long term (current) use of anticoagulants: Secondary | ICD-10-CM | POA: Diagnosis not present

## 2014-10-27 DIAGNOSIS — I2699 Other pulmonary embolism without acute cor pulmonale: Secondary | ICD-10-CM | POA: Diagnosis not present

## 2014-10-27 DIAGNOSIS — I8290 Acute embolism and thrombosis of unspecified vein: Secondary | ICD-10-CM | POA: Diagnosis not present

## 2014-11-24 ENCOUNTER — Telehealth: Payer: Self-pay | Admitting: Cardiology

## 2014-11-24 ENCOUNTER — Encounter: Payer: Medicare Other | Admitting: *Deleted

## 2014-11-24 NOTE — Telephone Encounter (Signed)
LMOVM reminding pt to send remote transmission.   

## 2014-11-25 ENCOUNTER — Encounter: Payer: Self-pay | Admitting: Cardiology

## 2014-11-28 ENCOUNTER — Other Ambulatory Visit: Payer: Self-pay | Admitting: Internal Medicine

## 2014-12-02 ENCOUNTER — Ambulatory Visit (INDEPENDENT_AMBULATORY_CARE_PROVIDER_SITE_OTHER): Payer: Medicare Other | Admitting: *Deleted

## 2014-12-02 DIAGNOSIS — I495 Sick sinus syndrome: Secondary | ICD-10-CM | POA: Diagnosis not present

## 2014-12-02 NOTE — Progress Notes (Signed)
Remote pacemaker transmission.   

## 2014-12-04 LAB — CUP PACEART REMOTE DEVICE CHECK
Battery Remaining Longevity: 117 mo
Battery Voltage: 3.03 V
Brady Statistic AP VP Percent: 0.1 %
Brady Statistic AS VP Percent: 0 %
Brady Statistic AS VS Percent: 0.62 %
Brady Statistic RV Percent Paced: 0.1 %
Lead Channel Impedance Value: 342 Ohm
Lead Channel Impedance Value: 437 Ohm
Lead Channel Impedance Value: 494 Ohm
Lead Channel Pacing Threshold Amplitude: 0.5 V
Lead Channel Pacing Threshold Amplitude: 0.75 V
Lead Channel Pacing Threshold Pulse Width: 0.4 ms
Lead Channel Sensing Intrinsic Amplitude: 4.125 mV
Lead Channel Sensing Intrinsic Amplitude: 6.125 mV
Lead Channel Sensing Intrinsic Amplitude: 6.125 mV
Lead Channel Setting Pacing Amplitude: 2.5 V
Lead Channel Setting Pacing Pulse Width: 0.4 ms
Lead Channel Setting Sensing Sensitivity: 2.8 mV
MDC IDC MSMT LEADCHNL RA IMPEDANCE VALUE: 456 Ohm
MDC IDC MSMT LEADCHNL RA SENSING INTR AMPL: 4.125 mV
MDC IDC MSMT LEADCHNL RV PACING THRESHOLD PULSEWIDTH: 0.4 ms
MDC IDC SESS DTM: 20160615134448
MDC IDC SET LEADCHNL RA PACING AMPLITUDE: 2 V
MDC IDC SET ZONE DETECTION INTERVAL: 400 ms
MDC IDC STAT BRADY AP VS PERCENT: 99.29 %
MDC IDC STAT BRADY RA PERCENT PACED: 99.38 %
Zone Setting Detection Interval: 400 ms

## 2014-12-08 DIAGNOSIS — Z7901 Long term (current) use of anticoagulants: Secondary | ICD-10-CM | POA: Diagnosis not present

## 2014-12-08 DIAGNOSIS — B356 Tinea cruris: Secondary | ICD-10-CM | POA: Diagnosis not present

## 2014-12-08 DIAGNOSIS — I8290 Acute embolism and thrombosis of unspecified vein: Secondary | ICD-10-CM | POA: Diagnosis not present

## 2014-12-08 DIAGNOSIS — D692 Other nonthrombocytopenic purpura: Secondary | ICD-10-CM | POA: Diagnosis not present

## 2014-12-08 DIAGNOSIS — I2699 Other pulmonary embolism without acute cor pulmonale: Secondary | ICD-10-CM | POA: Diagnosis not present

## 2014-12-08 DIAGNOSIS — L821 Other seborrheic keratosis: Secondary | ICD-10-CM | POA: Diagnosis not present

## 2014-12-08 DIAGNOSIS — Z85828 Personal history of other malignant neoplasm of skin: Secondary | ICD-10-CM | POA: Diagnosis not present

## 2014-12-15 DIAGNOSIS — Z6822 Body mass index (BMI) 22.0-22.9, adult: Secondary | ICD-10-CM | POA: Diagnosis not present

## 2014-12-15 DIAGNOSIS — I10 Essential (primary) hypertension: Secondary | ICD-10-CM | POA: Diagnosis not present

## 2014-12-15 DIAGNOSIS — M549 Dorsalgia, unspecified: Secondary | ICD-10-CM | POA: Diagnosis not present

## 2014-12-16 ENCOUNTER — Encounter: Payer: Self-pay | Admitting: Cardiology

## 2014-12-28 ENCOUNTER — Encounter: Payer: Self-pay | Admitting: Internal Medicine

## 2015-01-11 DIAGNOSIS — I2699 Other pulmonary embolism without acute cor pulmonale: Secondary | ICD-10-CM | POA: Diagnosis not present

## 2015-01-11 DIAGNOSIS — Z7901 Long term (current) use of anticoagulants: Secondary | ICD-10-CM | POA: Diagnosis not present

## 2015-02-08 DIAGNOSIS — Z7901 Long term (current) use of anticoagulants: Secondary | ICD-10-CM | POA: Diagnosis not present

## 2015-02-08 DIAGNOSIS — I8290 Acute embolism and thrombosis of unspecified vein: Secondary | ICD-10-CM | POA: Diagnosis not present

## 2015-02-08 DIAGNOSIS — I2699 Other pulmonary embolism without acute cor pulmonale: Secondary | ICD-10-CM | POA: Diagnosis not present

## 2015-02-08 DIAGNOSIS — I951 Orthostatic hypotension: Secondary | ICD-10-CM | POA: Diagnosis not present

## 2015-03-03 ENCOUNTER — Telehealth: Payer: Self-pay | Admitting: Cardiology

## 2015-03-03 ENCOUNTER — Ambulatory Visit (INDEPENDENT_AMBULATORY_CARE_PROVIDER_SITE_OTHER): Payer: Medicare Other | Admitting: *Deleted

## 2015-03-03 DIAGNOSIS — I495 Sick sinus syndrome: Secondary | ICD-10-CM | POA: Diagnosis not present

## 2015-03-03 NOTE — Telephone Encounter (Signed)
Spoke with pt and reminded pt of remote transmission that is due today. Pt verbalized understanding.   

## 2015-03-04 NOTE — Progress Notes (Signed)
Remote pacemaker transmission.   

## 2015-03-12 DIAGNOSIS — Z7901 Long term (current) use of anticoagulants: Secondary | ICD-10-CM | POA: Diagnosis not present

## 2015-03-12 DIAGNOSIS — I8290 Acute embolism and thrombosis of unspecified vein: Secondary | ICD-10-CM | POA: Diagnosis not present

## 2015-03-12 DIAGNOSIS — Z23 Encounter for immunization: Secondary | ICD-10-CM | POA: Diagnosis not present

## 2015-03-12 DIAGNOSIS — I951 Orthostatic hypotension: Secondary | ICD-10-CM | POA: Diagnosis not present

## 2015-03-12 DIAGNOSIS — I2699 Other pulmonary embolism without acute cor pulmonale: Secondary | ICD-10-CM | POA: Diagnosis not present

## 2015-03-17 DIAGNOSIS — M2041 Other hammer toe(s) (acquired), right foot: Secondary | ICD-10-CM | POA: Diagnosis not present

## 2015-03-18 LAB — CUP PACEART REMOTE DEVICE CHECK
Battery Remaining Longevity: 104 mo
Battery Voltage: 3.02 V
Brady Statistic AP VP Percent: 0.08 %
Brady Statistic AP VS Percent: 99.36 %
Brady Statistic AS VP Percent: 0 %
Brady Statistic AS VS Percent: 0.56 %
Date Time Interrogation Session: 20160914195451
Lead Channel Impedance Value: 342 Ohm
Lead Channel Impedance Value: 437 Ohm
Lead Channel Impedance Value: 456 Ohm
Lead Channel Impedance Value: 494 Ohm
Lead Channel Pacing Threshold Amplitude: 0.625 V
Lead Channel Pacing Threshold Amplitude: 0.625 V
Lead Channel Pacing Threshold Pulse Width: 0.4 ms
Lead Channel Pacing Threshold Pulse Width: 0.4 ms
Lead Channel Sensing Intrinsic Amplitude: 2.75 mV
Lead Channel Sensing Intrinsic Amplitude: 6.375 mV
Lead Channel Sensing Intrinsic Amplitude: 6.375 mV
Lead Channel Setting Pacing Amplitude: 2 V
Lead Channel Setting Pacing Amplitude: 2.5 V
Lead Channel Setting Sensing Sensitivity: 2.8 mV
MDC IDC MSMT LEADCHNL RA SENSING INTR AMPL: 2.75 mV
MDC IDC SET LEADCHNL RV PACING PULSEWIDTH: 0.4 ms
MDC IDC STAT BRADY RA PERCENT PACED: 99.44 %
MDC IDC STAT BRADY RV PERCENT PACED: 0.08 %
Zone Setting Detection Interval: 400 ms
Zone Setting Detection Interval: 400 ms

## 2015-03-24 ENCOUNTER — Telehealth: Payer: Self-pay | Admitting: *Deleted

## 2015-03-24 DIAGNOSIS — I472 Ventricular tachycardia, unspecified: Secondary | ICD-10-CM

## 2015-03-24 NOTE — Telephone Encounter (Signed)
Patient informed that Dr.Klein recommends that he have an echo due to his recent episodes of VT. Patient voiced understanding.  Forwarded to Lorenda Hatchet for scheduling.

## 2015-04-05 ENCOUNTER — Other Ambulatory Visit (HOSPITAL_COMMUNITY): Payer: Self-pay | Admitting: *Deleted

## 2015-04-05 MED ORDER — LISINOPRIL 10 MG PO TABS: 10.0000 mg | ORAL_TABLET | Freq: Two times a day (BID) | ORAL | Status: DC

## 2015-04-09 ENCOUNTER — Ambulatory Visit (HOSPITAL_COMMUNITY): Payer: Medicare Other | Attending: Cardiovascular Disease

## 2015-04-09 ENCOUNTER — Other Ambulatory Visit: Payer: Self-pay

## 2015-04-09 DIAGNOSIS — I340 Nonrheumatic mitral (valve) insufficiency: Secondary | ICD-10-CM | POA: Diagnosis not present

## 2015-04-09 DIAGNOSIS — E785 Hyperlipidemia, unspecified: Secondary | ICD-10-CM | POA: Diagnosis not present

## 2015-04-09 DIAGNOSIS — Z87891 Personal history of nicotine dependence: Secondary | ICD-10-CM | POA: Diagnosis not present

## 2015-04-09 DIAGNOSIS — I472 Ventricular tachycardia, unspecified: Secondary | ICD-10-CM

## 2015-04-09 DIAGNOSIS — I10 Essential (primary) hypertension: Secondary | ICD-10-CM | POA: Insufficient documentation

## 2015-04-16 DIAGNOSIS — Z7901 Long term (current) use of anticoagulants: Secondary | ICD-10-CM | POA: Diagnosis not present

## 2015-04-16 DIAGNOSIS — I2699 Other pulmonary embolism without acute cor pulmonale: Secondary | ICD-10-CM | POA: Diagnosis not present

## 2015-04-16 DIAGNOSIS — I951 Orthostatic hypotension: Secondary | ICD-10-CM | POA: Diagnosis not present

## 2015-04-16 DIAGNOSIS — I10 Essential (primary) hypertension: Secondary | ICD-10-CM | POA: Diagnosis not present

## 2015-04-16 DIAGNOSIS — I8290 Acute embolism and thrombosis of unspecified vein: Secondary | ICD-10-CM | POA: Diagnosis not present

## 2015-04-20 DIAGNOSIS — I10 Essential (primary) hypertension: Secondary | ICD-10-CM | POA: Diagnosis not present

## 2015-04-20 DIAGNOSIS — R7301 Impaired fasting glucose: Secondary | ICD-10-CM | POA: Diagnosis not present

## 2015-04-20 DIAGNOSIS — Z125 Encounter for screening for malignant neoplasm of prostate: Secondary | ICD-10-CM | POA: Diagnosis not present

## 2015-04-20 DIAGNOSIS — E785 Hyperlipidemia, unspecified: Secondary | ICD-10-CM | POA: Diagnosis not present

## 2015-04-27 DIAGNOSIS — D126 Benign neoplasm of colon, unspecified: Secondary | ICD-10-CM | POA: Diagnosis not present

## 2015-04-27 DIAGNOSIS — M549 Dorsalgia, unspecified: Secondary | ICD-10-CM | POA: Diagnosis not present

## 2015-04-27 DIAGNOSIS — N529 Male erectile dysfunction, unspecified: Secondary | ICD-10-CM | POA: Diagnosis not present

## 2015-04-27 DIAGNOSIS — Z0000 Encounter for general adult medical examination without abnormal findings: Secondary | ICD-10-CM | POA: Diagnosis not present

## 2015-04-27 DIAGNOSIS — K625 Hemorrhage of anus and rectum: Secondary | ICD-10-CM | POA: Diagnosis not present

## 2015-04-27 DIAGNOSIS — Z6822 Body mass index (BMI) 22.0-22.9, adult: Secondary | ICD-10-CM | POA: Diagnosis not present

## 2015-04-27 DIAGNOSIS — Z7901 Long term (current) use of anticoagulants: Secondary | ICD-10-CM | POA: Diagnosis not present

## 2015-04-27 DIAGNOSIS — I8290 Acute embolism and thrombosis of unspecified vein: Secondary | ICD-10-CM | POA: Diagnosis not present

## 2015-04-27 DIAGNOSIS — I2699 Other pulmonary embolism without acute cor pulmonale: Secondary | ICD-10-CM | POA: Diagnosis not present

## 2015-04-27 DIAGNOSIS — Z1389 Encounter for screening for other disorder: Secondary | ICD-10-CM | POA: Diagnosis not present

## 2015-04-27 DIAGNOSIS — R001 Bradycardia, unspecified: Secondary | ICD-10-CM | POA: Diagnosis not present

## 2015-04-27 DIAGNOSIS — R7301 Impaired fasting glucose: Secondary | ICD-10-CM | POA: Diagnosis not present

## 2015-04-28 ENCOUNTER — Encounter: Payer: Self-pay | Admitting: Internal Medicine

## 2015-05-07 DIAGNOSIS — N5201 Erectile dysfunction due to arterial insufficiency: Secondary | ICD-10-CM | POA: Diagnosis not present

## 2015-05-07 DIAGNOSIS — N401 Enlarged prostate with lower urinary tract symptoms: Secondary | ICD-10-CM | POA: Diagnosis not present

## 2015-05-07 DIAGNOSIS — N138 Other obstructive and reflux uropathy: Secondary | ICD-10-CM | POA: Diagnosis not present

## 2015-05-12 DIAGNOSIS — H2513 Age-related nuclear cataract, bilateral: Secondary | ICD-10-CM | POA: Diagnosis not present

## 2015-05-19 ENCOUNTER — Ambulatory Visit (INDEPENDENT_AMBULATORY_CARE_PROVIDER_SITE_OTHER): Payer: Medicare Other | Admitting: Internal Medicine

## 2015-05-19 ENCOUNTER — Encounter: Payer: Self-pay | Admitting: Internal Medicine

## 2015-05-19 VITALS — BP 110/70 | HR 66 | Ht 75.5 in | Wt 187.0 lb

## 2015-05-19 DIAGNOSIS — Z7901 Long term (current) use of anticoagulants: Secondary | ICD-10-CM | POA: Diagnosis not present

## 2015-05-19 DIAGNOSIS — Z860100 Personal history of colon polyps, unspecified: Secondary | ICD-10-CM

## 2015-05-19 DIAGNOSIS — K648 Other hemorrhoids: Secondary | ICD-10-CM

## 2015-05-19 NOTE — Progress Notes (Signed)
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 He also has a history of IBS. He presents today after discussion with his primary care provider, Steven Macdonald, interested in pursuing colonoscopy. I last saw the patient February 2015 for chest pain not felt to be GI in nature and the discussion regarding surveillance colonoscopy recommendations. At that time, no further routine surveillance was recommended. Patient tells me today that he is interested in repeat colonoscopy given the fact that he has had polyps previously. He tells me that there is great longevity and his family and he fears colon cancer having known friends and relatives developing this illness. I again reviewed his history. No first-degree relatives with colon cancer. The patient underwent colonoscopy in 1994 (diminutive hyperplastic polyp only), 1999 (diminutive tubular adenoma), 2002 (normal), 2006 (normal), 2010 (diminutive hyperplastic rectal polyp). He does have internal hemorrhoids. Patient's GI review of systems is only remarkable for minor blood on the tissue which is attributed to hemorrhoids. He continues on Coumadin. I reviewed with him in detail current consensus recommendations for polyp surveillance from our societies and task force. Despite all this, he wishes to proceed.  REVIEW OF SYSTEMS:  All non-GI ROS negative except for  Past Medical History  Diagnosis Date  . HLD (hyperlipidemia)   . HTN (hypertension)   . Pulmonary embolism ([place])   . Cardiomyopathy, nonischemic -resolved     Echo 2008: EF 25% Cardiac CT 2009: Normal cors EF 31% Echo 3/10 EF 45% Echo 6/13: EF 50-55%    . LBBB (left bundle branch  block)   . Sinus node dysfunction ([facility])        . ED (erectile dysfunction)   . Hemorrhoids   . Atrial flutter ([place])     RFCA   . Arthritis   . Pacemaker  Medtronic     DOI 2/06  . Colon polyps     hyperplastic  . Diabetes mellitus ([place])   . Gilbert's syndrome     possible  . BPH (benign prostatic hypertrophy)   . Orthostatic hypotension   . CHF (congestive heart failure) ([place])   . Herniated cervical disc   . Chicken pox   . Precancerous melanosis     Past Surgical History  Procedure Laterality Date  . Pacemaker insertion    . Cystoscopy w/ transurethral resection of posterior uretheral valves    . Pacemaker generator change N/A 05/13/2014    Procedure: PACEMAKER GENERATOR CHANGE;  Surgeon: Deboraha Sprang, MD;  Location: [facility] CATH LAB;  Service: Cardiovascular;  Laterality: N/A;  . Coronary ablation    . Cardioversion      Social History Steven Macdonald  reports that he quit smoking about 44 years ago. He has never used smokeless tobacco. He reports that he drinks alcohol. He reports that he does not use illicit drugs.  family history includes Bladder Cancer in his father; Breast cancer in his mother; Colon polyps in his father.  No Known Allergies     PHYSICAL EXAMINATION: Vital signs: BP 110/70 mmHg  Pulse 66  Ht 6' 3.5" (1.918 m)  Wt 187 lb (84.823 kg)  BMI 23.06 kg/m2  Constitutional: generally well-appearing, no acute distress Psychiatric: alert and oriented x3, cooperative Eyes: extraocular movements intact, anicteric,  conjunctiva pink Mouth: oral pharynx moist, no lesions Neck: supple no lymphadenopathy Cardiovascular: heart regular rate and rhythm, no murmur Lungs: clear to auscultation bilaterally Abdomen: soft, nontender, nondistended, no obvious ascites, no peritoneal signs, normal bowel sounds, no organomegaly Rectal: Deferred until colonoscopy Extremities: no clubbing cyanosis or lower extremity edema bilaterally Skin: no lesions on visible  extremities Neuro: No focal deficits. Normal DTRs.    ASSESSMENT:  #1. Remote history of diminutive tubular adenoma 1999. Multiple other colonoscopies negative for neoplasia #2. Minor blood per rectum secondary to known hemorrhoids #3. Multiple medical problems as outlined above. Stable\ #4. Chronic anticoagulation the form of Coumadin for history of PE  PLAN:  #1. Proceed with colonoscopy given a remote history of adenoma. Patient insists on proceeding despite being fully informed regarding his personal history and current guidelines. Also, I informed him that he is higher than baseline risk given his comorbidities and the need to manipulate anticoagulation therapy for the procedure. He feels that he has a strong endorsement from his primary providers well. Since has been greater than 5 years since his last examination, wants to proceed to eliminate even the smallest risk of interval cancer. I was agreeable to perform examination for him given his complete understanding of the issues at hand.The nature of the procedure, as well as the risks, benefits, and alternatives were carefully and thoroughly reviewed with the patient. Ample time for discussion and questions allowed. The patient understood, was satisfied, and agreed to proceed. #2. Fiber supplementation for known hemorrhoids #3. Confer with his primary provider regarding the feasibility of holding Coumadin approximate 5 days prior to his procedure. Anticipate resuming therapy that day assuming no significant colonic pathology.  25 minutes was spent face-to-face with the patient. The majority of the time used for counseling regarding colon polyp surveillance issues as outlined

## 2015-05-19 NOTE — Patient Instructions (Signed)
You have been scheduled for a colonoscopy. Please follow written instructions given to you at your visit today.  Please pick up your prep supplies at the pharmacy within the next 1-3 days. If you use inhalers (even only as needed), please bring them with you on the day of your procedure.   

## 2015-05-20 ENCOUNTER — Telehealth: Payer: Self-pay

## 2015-05-20 NOTE — Telephone Encounter (Signed)
Faxed anticoagulation letter to Dr. Joylene Draft

## 2015-05-24 ENCOUNTER — Other Ambulatory Visit (HOSPITAL_COMMUNITY): Payer: Self-pay | Admitting: *Deleted

## 2015-05-25 ENCOUNTER — Other Ambulatory Visit (HOSPITAL_COMMUNITY): Payer: Self-pay | Admitting: *Deleted

## 2015-05-25 MED ORDER — BISOPROLOL FUMARATE 10 MG PO TABS: 5.0000 mg | ORAL_TABLET | Freq: Every day | ORAL | Status: DC

## 2015-05-27 DIAGNOSIS — Z7901 Long term (current) use of anticoagulants: Secondary | ICD-10-CM | POA: Diagnosis not present

## 2015-05-27 DIAGNOSIS — I2699 Other pulmonary embolism without acute cor pulmonale: Secondary | ICD-10-CM | POA: Diagnosis not present

## 2015-05-27 DIAGNOSIS — I8290 Acute embolism and thrombosis of unspecified vein: Secondary | ICD-10-CM | POA: Diagnosis not present

## 2015-06-03 ENCOUNTER — Ambulatory Visit (INDEPENDENT_AMBULATORY_CARE_PROVIDER_SITE_OTHER): Payer: Medicare Other | Admitting: *Deleted

## 2015-06-03 ENCOUNTER — Telehealth: Payer: Self-pay | Admitting: Cardiology

## 2015-06-03 DIAGNOSIS — I495 Sick sinus syndrome: Secondary | ICD-10-CM | POA: Diagnosis not present

## 2015-06-03 NOTE — Telephone Encounter (Signed)
Spoke with pt and reminded pt of remote transmission that is due today. Pt verbalized understanding.   

## 2015-06-04 ENCOUNTER — Telehealth: Payer: Self-pay | Admitting: Internal Medicine

## 2015-06-04 NOTE — Progress Notes (Signed)
Remote pacemaker transmission.   

## 2015-06-04 NOTE — Telephone Encounter (Signed)
Spoke w/ pt and assisted him with sending remote transmission. Transmission was received.

## 2015-06-04 NOTE — Telephone Encounter (Signed)
New Message  Pt stated that his home monitor is not working and needs help selnding his remot transmission. Please call back and discuss.

## 2015-06-04 NOTE — Telephone Encounter (Signed)
LMOVM for pt to return call 

## 2015-06-07 LAB — CUP PACEART REMOTE DEVICE CHECK
Battery Remaining Longevity: 104 mo
Battery Voltage: 3.02 V
Brady Statistic AP VP Percent: 0.12 %
Brady Statistic AP VS Percent: 97.29 %
Brady Statistic AS VP Percent: 0 %
Brady Statistic AS VS Percent: 2.59 %
Brady Statistic RA Percent Paced: 97.41 %
Brady Statistic RV Percent Paced: 0.12 %
Date Time Interrogation Session: 20161216205342
Implantable Lead Implant Date: 20060223
Implantable Lead Implant Date: 20060223
Implantable Lead Location: 753859
Implantable Lead Location: 753860
Implantable Lead Model: 5076
Implantable Lead Model: 5076
Lead Channel Impedance Value: 342 Ohm
Lead Channel Impedance Value: 418 Ohm
Lead Channel Impedance Value: 456 Ohm
Lead Channel Impedance Value: 513 Ohm
Lead Channel Pacing Threshold Amplitude: 0.375 V
Lead Channel Pacing Threshold Amplitude: 0.625 V
Lead Channel Pacing Threshold Pulse Width: 0.4 ms
Lead Channel Pacing Threshold Pulse Width: 0.4 ms
Lead Channel Sensing Intrinsic Amplitude: 2.25 mV
Lead Channel Sensing Intrinsic Amplitude: 2.25 mV
Lead Channel Sensing Intrinsic Amplitude: 6.5 mV
Lead Channel Sensing Intrinsic Amplitude: 6.5 mV
Lead Channel Setting Pacing Amplitude: 2 V
Lead Channel Setting Pacing Amplitude: 2.5 V
Lead Channel Setting Pacing Pulse Width: 0.4 ms
Lead Channel Setting Sensing Sensitivity: 2.8 mV

## 2015-06-10 ENCOUNTER — Encounter: Payer: Self-pay | Admitting: Cardiology

## 2015-06-15 ENCOUNTER — Encounter: Payer: Self-pay | Admitting: *Deleted

## 2015-06-22 NOTE — Telephone Encounter (Signed)
refaxed anticoag letter for 3rd time

## 2015-06-29 NOTE — Telephone Encounter (Signed)
Left message that, per Dr. Joylene Draft, patient can hold his Coumadin for 5 day prior to his procedure.  I requested a return phone call to make sure he got these instructions.

## 2015-06-30 ENCOUNTER — Telehealth: Payer: Self-pay | Admitting: Internal Medicine

## 2015-06-30 MED ORDER — NA SULFATE-K SULFATE-MG SULF 17.5-3.13-1.6 GM/177ML PO SOLN: 1.0000 | Freq: Once | ORAL | Status: DC

## 2015-06-30 NOTE — Telephone Encounter (Signed)
Suprep sent to [facility]

## 2015-07-02 DIAGNOSIS — I2699 Other pulmonary embolism without acute cor pulmonale: Secondary | ICD-10-CM | POA: Diagnosis not present

## 2015-07-02 DIAGNOSIS — Z6823 Body mass index (BMI) 23.0-23.9, adult: Secondary | ICD-10-CM | POA: Diagnosis not present

## 2015-07-02 DIAGNOSIS — M169 Osteoarthritis of hip, unspecified: Secondary | ICD-10-CM | POA: Diagnosis not present

## 2015-07-02 DIAGNOSIS — M47817 Spondylosis without myelopathy or radiculopathy, lumbosacral region: Secondary | ICD-10-CM | POA: Diagnosis not present

## 2015-07-02 DIAGNOSIS — Z7901 Long term (current) use of anticoagulants: Secondary | ICD-10-CM | POA: Diagnosis not present

## 2015-07-02 DIAGNOSIS — I8290 Acute embolism and thrombosis of unspecified vein: Secondary | ICD-10-CM | POA: Diagnosis not present

## 2015-07-02 DIAGNOSIS — M25551 Pain in right hip: Secondary | ICD-10-CM | POA: Diagnosis not present

## 2015-07-02 DIAGNOSIS — M179 Osteoarthritis of knee, unspecified: Secondary | ICD-10-CM | POA: Diagnosis not present

## 2015-07-05 NOTE — Telephone Encounter (Signed)
Patient's wife called to verify that they received and understood the message that patient could hold his coumadin for 5 days prior to his procedure.

## 2015-07-09 ENCOUNTER — Ambulatory Visit (AMBULATORY_SURGERY_CENTER): Payer: Medicare Other | Admitting: Internal Medicine

## 2015-07-09 ENCOUNTER — Encounter: Payer: Self-pay | Admitting: Internal Medicine

## 2015-07-09 VITALS — BP 126/76 | HR 61 | Temp 96.9°F | Resp 17 | Ht 75.0 in | Wt 187.0 lb

## 2015-07-09 DIAGNOSIS — Z86010 Personal history of colonic polyps: Secondary | ICD-10-CM

## 2015-07-09 DIAGNOSIS — D123 Benign neoplasm of transverse colon: Secondary | ICD-10-CM

## 2015-07-09 DIAGNOSIS — E119 Type 2 diabetes mellitus without complications: Secondary | ICD-10-CM | POA: Diagnosis not present

## 2015-07-09 DIAGNOSIS — D120 Benign neoplasm of cecum: Secondary | ICD-10-CM

## 2015-07-09 MED ORDER — SODIUM CHLORIDE 0.9 % IV SOLN: 500.0000 mL | INTRAVENOUS | Status: DC

## 2015-07-09 NOTE — Progress Notes (Signed)
Called to room to assist during endoscopic procedure.  Patient ID and intended procedure confirmed with present staff. Received instructions for my participation in the procedure from the performing physician.  

## 2015-07-09 NOTE — Op Note (Signed)
[place]  [address] [place], [id number]   COLONOSCOPY PROCEDURE REPORT  PATIENT: Steven Macdonald, Steven Macdonald  MR#: [id number] BIRTHDATE: February 15, 1935 , 28  yrs. old GENDER: male ENDOSCOPIST: Eustace Quail, MD REFERRED BY:.  Self / Office PROCEDURE DATE:  07/09/2015 PROCEDURE:   Colonoscopy, surveillance and Colonoscopy with snare polypectomy x 3 First Screening Colonoscopy - Avg.  risk and is 50 yrs.  old or older - No.  Prior Negative Screening - Now for repeat screening. N/A  History of Adenoma - Now for follow-up colonoscopy & has been > or = to 3 yrs.  Yes hx of adenoma.  Has been 3 or more years since last colonoscopy.  Polyps removed today? Yes ASA CLASS:   Class III INDICATIONS:Surveillance due to prior colonic neoplasia and PH Colon Adenoma.. Index exam 1999 with small tubular adenoma. Multiple interval colonoscopies without neoplasia. Last examination 2010 MEDICATIONS: Monitored anesthesia care and Propofol 400 mg IV  DESCRIPTION OF PROCEDURE:   After the risks benefits and alternatives of the procedure were thoroughly explained, informed consent was obtained.  The digital rectal exam revealed no abnormalities of the rectum.   The LB [id number]  endoscope was introduced through the anus and advanced to the cecum, which was identified by both the appendix and ileocecal valve. No adverse events experienced.   The quality of the prep was excellent. (Suprep was used)  The instrument was then slowly withdrawn as the colon was fully examined. Estimated blood loss is zero unless otherwise noted in this procedure report.   COLON FINDINGS: Three polyps measuring 3 mm in size were found in the transverse colon and at the cecum.  A polypectomy was performed with a cold snare.  The resection was complete, the polyp tissue was completely retrieved and sent to histology.   The examination was otherwise normal.  Retroflexed views revealed internal hemorrhoids. The  time to cecum = 6.6 Withdrawal time = 16.4   The scope was withdrawn and the procedure completed. COMPLICATIONS: There were no immediate complications.  ENDOSCOPIC IMPRESSION: 1.   Three tiny polyps were found in the transverse colon and at the cecum; polypectomy was performed with a cold snare 2.   The examination was otherwise normal  RECOMMENDATIONS: 1. Return to the care of your primary provider.  GI follow up as needed  eSigned:  Eustace Quail, MD 07/09/2015 4:59 PM   cc: The Patient and Crist Infante, MD

## 2015-07-09 NOTE — Progress Notes (Signed)
Patient awakening,vss,report to rn 

## 2015-07-09 NOTE — Patient Instructions (Signed)
Restart your Coumadin tonight. Check your PT/INR within the week.  Handout: Polyps   YOU HAD AN ENDOSCOPIC PROCEDURE TODAY AT [facility]:   Refer to the procedure report that was given to you for any specific questions about what was found during the examination.  If the procedure report does not answer your questions, please call your gastroenterologist to clarify.  If you requested that your care partner not be given the details of your procedure findings, then the procedure report has been included in a sealed envelope for you to review at your convenience later.  YOU SHOULD EXPECT: Some feelings of bloating in the abdomen. Passage of more gas than usual.  Walking can help get rid of the air that was put into your GI tract during the procedure and reduce the bloating. If you had a lower endoscopy (such as a colonoscopy or flexible sigmoidoscopy) you may notice spotting of blood in your stool or on the toilet paper. If you underwent a bowel prep for your procedure, you may not have a normal bowel movement for a few days.  Please Note:  You might notice some irritation and congestion in your nose or some drainage.  This is from the oxygen used during your procedure.  There is no need for concern and it should clear up in a day or so.  SYMPTOMS TO REPORT IMMEDIATELY:   Following lower endoscopy (colonoscopy or flexible sigmoidoscopy):  Excessive amounts of blood in the stool  Significant tenderness or worsening of abdominal pains  Swelling of the abdomen that is new, acute  Fever of 100F or higher     For urgent or emergent issues, a gastroenterologist can be reached at any hour by calling [phone].   DIET: Your first meal following the procedure should be a small meal and then it is ok to progress to your normal diet. Heavy or fried foods are harder to digest and may make you feel nauseous or bloated.  Likewise, meals heavy in dairy and vegetables can increase  bloating.  Drink plenty of fluids but you should avoid alcoholic beverages for 24 hours.  ACTIVITY:  You should plan to take it easy for the rest of today and you should NOT DRIVE or use heavy machinery until tomorrow (because of the sedation medicines used during the test).    FOLLOW UP: Our staff will call the number listed on your records the next business day following your procedure to check on you and address any questions or concerns that you may have regarding the information given to you following your procedure. If we do not reach you, we will leave a message.  However, if you are feeling well and you are not experiencing any problems, there is no need to return our call.  We will assume that you have returned to your regular daily activities without incident.  If any biopsies were taken you will be contacted by phone or by letter within the next 1-3 weeks.  Please call us at [phone] if you have not heard about the biopsies in 3 weeks.    SIGNATURES/CONFIDENTIALITY: You and/or your care partner have signed paperwork which will be entered into your electronic medical record.  These signatures attest to the fact that that the information above on your After Visit Summary has been reviewed and is understood.  Full responsibility of the confidentiality of this discharge information lies with you and/or your care-partner.

## 2015-07-12 ENCOUNTER — Telehealth: Payer: Self-pay

## 2015-07-12 NOTE — Telephone Encounter (Signed)
  Follow up Call-  Call back number 07/09/2015  Post procedure Call Back phone  # [phone]  Permission to leave phone message Yes     Patient was called for follow up after procedure on 07/09/2015. No answer at the number given for follow up call. I was not able to leave a message.

## 2015-07-15 ENCOUNTER — Encounter: Payer: Self-pay | Admitting: Internal Medicine

## 2015-08-06 DIAGNOSIS — I8290 Acute embolism and thrombosis of unspecified vein: Secondary | ICD-10-CM | POA: Diagnosis not present

## 2015-08-06 DIAGNOSIS — I2699 Other pulmonary embolism without acute cor pulmonale: Secondary | ICD-10-CM | POA: Diagnosis not present

## 2015-08-06 DIAGNOSIS — Z7901 Long term (current) use of anticoagulants: Secondary | ICD-10-CM | POA: Diagnosis not present

## 2015-08-06 DIAGNOSIS — Z6822 Body mass index (BMI) 22.0-22.9, adult: Secondary | ICD-10-CM | POA: Diagnosis not present

## 2015-08-06 DIAGNOSIS — D126 Benign neoplasm of colon, unspecified: Secondary | ICD-10-CM | POA: Diagnosis not present

## 2015-08-12 ENCOUNTER — Encounter: Payer: Self-pay | Admitting: Internal Medicine

## 2015-08-12 ENCOUNTER — Ambulatory Visit (INDEPENDENT_AMBULATORY_CARE_PROVIDER_SITE_OTHER): Payer: Medicare Other | Admitting: Internal Medicine

## 2015-08-12 VITALS — BP 136/80 | HR 66 | Ht 75.0 in | Wt 186.0 lb

## 2015-08-12 DIAGNOSIS — I495 Sick sinus syndrome: Secondary | ICD-10-CM | POA: Diagnosis not present

## 2015-08-12 DIAGNOSIS — Z45018 Encounter for adjustment and management of other part of cardiac pacemaker: Secondary | ICD-10-CM | POA: Diagnosis not present

## 2015-08-12 DIAGNOSIS — I429 Cardiomyopathy, unspecified: Secondary | ICD-10-CM | POA: Diagnosis not present

## 2015-08-12 DIAGNOSIS — I428 Other cardiomyopathies: Secondary | ICD-10-CM

## 2015-08-12 LAB — CUP PACEART INCLINIC DEVICE CHECK
Battery Remaining Longevity: 103 mo
Battery Voltage: 3.02 V
Brady Statistic AS VP Percent: 0 %
Brady Statistic AS VS Percent: 1.91 %
Brady Statistic RA Percent Paced: 98.09 %
Implantable Lead Implant Date: 20060223
Implantable Lead Location: 753860
Lead Channel Impedance Value: 361 Ohm
Lead Channel Pacing Threshold Amplitude: 0.5 V
Lead Channel Pacing Threshold Amplitude: 0.75 V
Lead Channel Pacing Threshold Pulse Width: 0.4 ms
Lead Channel Pacing Threshold Pulse Width: 0.4 ms
Lead Channel Sensing Intrinsic Amplitude: 2.25 mV
Lead Channel Sensing Intrinsic Amplitude: 5.5 mV
Lead Channel Setting Pacing Pulse Width: 0.4 ms
MDC IDC LEAD IMPLANT DT: 20060223
MDC IDC LEAD LOCATION: 753859
MDC IDC MSMT LEADCHNL RA IMPEDANCE VALUE: 475 Ohm
MDC IDC MSMT LEADCHNL RA SENSING INTR AMPL: 2.25 mV
MDC IDC MSMT LEADCHNL RV IMPEDANCE VALUE: 494 Ohm
MDC IDC MSMT LEADCHNL RV IMPEDANCE VALUE: 532 Ohm
MDC IDC MSMT LEADCHNL RV SENSING INTR AMPL: 6.5 mV
MDC IDC SESS DTM: 20170223171612
MDC IDC SET LEADCHNL RA PACING AMPLITUDE: 2 V
MDC IDC SET LEADCHNL RV PACING AMPLITUDE: 2.5 V
MDC IDC SET LEADCHNL RV SENSING SENSITIVITY: 2.8 mV
MDC IDC STAT BRADY AP VP PERCENT: 0.11 %
MDC IDC STAT BRADY AP VS PERCENT: 97.99 %
MDC IDC STAT BRADY RV PERCENT PACED: 0.11 %

## 2015-08-12 NOTE — Patient Instructions (Signed)
Medication Instructions: - none  Labwork: - none  Procedures/Testing: - none  Follow-Up: - Remote monitoring is used to monitor your Pacemaker of ICD from home. This monitoring reduces the number of office visits required to check your device to one time per year. It allows [place] to keep an eye on the functioning of your device to ensure it is working properly. You are scheduled for a device check from home on 11/11/15. You may send your transmission at any time that day. If you have a wireless device, the transmission will be sent automatically. After your physician reviews your transmission, you will receive a postcard with your next transmission date.  - Your physician wants you to follow-up in: 1 year with Dr. Caryl Comes. You will receive a reminder letter in the mail two months in advance. If you don't receive a letter, please call our office to schedule the follow-up appointment.  Any Additional Special Instructions Will Be Listed Below (If Applicable).     If you need a refill on your cardiac medications before your next appointment, please call your pharmacy.

## 2015-08-12 NOTE — Progress Notes (Signed)
He would like to      Patient Care Team: Crist Infante, MD as PCP - General   HPI  Steven Macdonald is a 81 y.o. male Seen in followup for AFlutter s/p RFCA He has had no recurrent arrhythmia.  He has a previously implanted dual-chamber Medtronic pacemaker sinus node dysfunction  His device reverted to VVI and he underrwent generator repalcement 11/15  He was seen in the heart failure clinic 12/13. There have been variable reports a left ventricular function initially in the 25% range with interval improvement. Echocardiogram 1/15 demonstrated an EF of 45-50%. It had been in the 45-55% range now for a couple of years.   He has a history of pulmonary embolism and is on Coumadin.  He is quite active.   Past Medical History  Diagnosis Date  . HLD (hyperlipidemia)   . HTN (hypertension)   . Pulmonary embolism ([place])   . Cardiomyopathy, nonischemic -resolved     Echo 2008: EF 25% Cardiac CT 2009: Normal cors EF 31% Echo 3/10 EF 45% Echo 6/13: EF 50-55%    . LBBB (left bundle branch block)   . Sinus node dysfunction ([facility])        . ED (erectile dysfunction)   . Hemorrhoids   . Atrial flutter ([place])     RFCA   . Arthritis   . Pacemaker  Medtronic     DOI 2/06  . Colon polyps     hyperplastic  . Diabetes mellitus ([place])   . Gilbert's syndrome     possible  . BPH (benign prostatic hypertrophy)   . Orthostatic hypotension   . CHF (congestive heart failure) ([place])   . Herniated cervical disc   . Chicken pox   . Precancerous melanosis     Past Surgical History  Procedure Laterality Date  . Pacemaker insertion    . Cystoscopy w/ transurethral resection of posterior uretheral valves    . Pacemaker generator change N/A 05/13/2014    Procedure: PACEMAKER GENERATOR CHANGE;  Surgeon: Deboraha Sprang, MD;  Location: [facility] CATH LAB;  Service: Cardiovascular;  Laterality: N/A;  . Coronary ablation    . Cardioversion      Current Outpatient Prescriptions  Medication Sig Dispense Refill    . bisoprolol (ZEBETA) 5 MG tablet Take 2.5 mg by mouth daily.    . cholecalciferol (VITAMIN D) 1000 UNITS tablet Take 1,000 Units by mouth daily.    . fish oil-omega-3 fatty acids 1000 MG capsule Take 1 tablet by mouth daily    . lisinopril (PRINIVIL,ZESTRIL) 5 MG tablet Take 2.5 mg by mouth daily.    . Psyllium (METAMUCIL MULTIHEALTH FIBER PO) Take by mouth daily.     . sildenafil (VIAGRA) 100 MG tablet Take 100 mg by mouth daily as needed.      . simvastatin (ZOCOR) 40 MG tablet Take 20 mg by mouth 2 (two) times daily.     Marland Kitchen warfarin (COUMADIN) 7.5 MG tablet Take 7.5 mg by mouth daily. Except on Friday takes 5 mg     No current facility-administered medications for this visit.    No Known Allergies  Review of Systems negative except from HPI and PMH  Physical Exam BP 136/80 mmHg  Pulse 66  Ht 6\' 3"  (1.905 m)  Wt 186 lb (84.369 kg)  BMI 23.25 kg/m2 Well developed and well nourished in no acute distress HENT normal E scleral and icterus clear Neck Supple JVP flat; carotids brisk and full Clear to ausculation  Regular rate and rhythm, no murmurs gallops or rub Soft with active bowel sounds No clubbing cyanosis  Edema Alert and oriented, grossly normal motor and sensory function Skin Warm and Dry  Atrial pacing @66  with left bundle branch block 18/16/44  Assessment and  Plan  Sinus node dysfunction  Hypotension  better  Pacemaker-Medtronic The patient's device was interrogated.  The information was reviewed. No changes were made in the programming.    Nonischemic cardiomyopathy-resolved  Pulmonary Embolism  Chronic therapy  Stable  Cardiac   Discussion re NOAC v coumadin   He will review with Dr Joylene Draft re conversion  I am in favor of switching  We spent more than 50% of our >25 min visit in face to face counseling regarding the above

## 2015-09-09 DIAGNOSIS — I8290 Acute embolism and thrombosis of unspecified vein: Secondary | ICD-10-CM | POA: Diagnosis not present

## 2015-09-09 DIAGNOSIS — I2699 Other pulmonary embolism without acute cor pulmonale: Secondary | ICD-10-CM | POA: Diagnosis not present

## 2015-09-09 DIAGNOSIS — Z7901 Long term (current) use of anticoagulants: Secondary | ICD-10-CM | POA: Diagnosis not present

## 2015-10-12 DIAGNOSIS — I2699 Other pulmonary embolism without acute cor pulmonale: Secondary | ICD-10-CM | POA: Diagnosis not present

## 2015-10-12 DIAGNOSIS — I8290 Acute embolism and thrombosis of unspecified vein: Secondary | ICD-10-CM | POA: Diagnosis not present

## 2015-10-12 DIAGNOSIS — Z7901 Long term (current) use of anticoagulants: Secondary | ICD-10-CM | POA: Diagnosis not present

## 2015-10-29 ENCOUNTER — Other Ambulatory Visit: Payer: Self-pay | Admitting: Internal Medicine

## 2015-11-11 ENCOUNTER — Ambulatory Visit (INDEPENDENT_AMBULATORY_CARE_PROVIDER_SITE_OTHER): Payer: Medicare Other | Admitting: *Deleted

## 2015-11-11 ENCOUNTER — Telehealth: Payer: Self-pay | Admitting: Cardiology

## 2015-11-11 DIAGNOSIS — I495 Sick sinus syndrome: Secondary | ICD-10-CM | POA: Diagnosis not present

## 2015-11-11 NOTE — Telephone Encounter (Signed)
LM w/ receptionist for pt to return call.

## 2015-11-12 NOTE — Progress Notes (Signed)
Remote pacemaker transmission.   

## 2015-11-18 DIAGNOSIS — Z7901 Long term (current) use of anticoagulants: Secondary | ICD-10-CM | POA: Diagnosis not present

## 2015-11-18 DIAGNOSIS — I2699 Other pulmonary embolism without acute cor pulmonale: Secondary | ICD-10-CM | POA: Diagnosis not present

## 2015-11-18 DIAGNOSIS — I8290 Acute embolism and thrombosis of unspecified vein: Secondary | ICD-10-CM | POA: Diagnosis not present

## 2015-11-24 ENCOUNTER — Encounter: Payer: Self-pay | Admitting: Cardiology

## 2015-11-28 LAB — CUP PACEART REMOTE DEVICE CHECK
Battery Remaining Longevity: 91 mo
Brady Statistic AP VS Percent: 93.78 %
Brady Statistic AS VS Percent: 6.02 %
Implantable Lead Implant Date: 20060223
Implantable Lead Location: 753859
Implantable Lead Model: 5076
Lead Channel Impedance Value: 361 Ohm
Lead Channel Impedance Value: 456 Ohm
Lead Channel Pacing Threshold Amplitude: 0.75 V
Lead Channel Pacing Threshold Pulse Width: 0.4 ms
Lead Channel Sensing Intrinsic Amplitude: 2.125 mV
Lead Channel Sensing Intrinsic Amplitude: 2.125 mV
Lead Channel Sensing Intrinsic Amplitude: 6 mV
Lead Channel Setting Pacing Amplitude: 2.5 V
Lead Channel Setting Sensing Sensitivity: 2.8 mV
MDC IDC LEAD IMPLANT DT: 20060223
MDC IDC LEAD LOCATION: 753860
MDC IDC MSMT BATTERY VOLTAGE: 3.01 V
MDC IDC MSMT LEADCHNL RA PACING THRESHOLD AMPLITUDE: 0.5 V
MDC IDC MSMT LEADCHNL RA PACING THRESHOLD PULSEWIDTH: 0.4 ms
MDC IDC MSMT LEADCHNL RV IMPEDANCE VALUE: 456 Ohm
MDC IDC MSMT LEADCHNL RV IMPEDANCE VALUE: 513 Ohm
MDC IDC MSMT LEADCHNL RV SENSING INTR AMPL: 6 mV
MDC IDC SESS DTM: 20170525202911
MDC IDC SET LEADCHNL RA PACING AMPLITUDE: 2 V
MDC IDC SET LEADCHNL RV PACING PULSEWIDTH: 0.4 ms
MDC IDC STAT BRADY AP VP PERCENT: 0.2 %
MDC IDC STAT BRADY AS VP PERCENT: 0.01 %
MDC IDC STAT BRADY RA PERCENT PACED: 93.98 %
MDC IDC STAT BRADY RV PERCENT PACED: 0.21 %

## 2015-11-29 ENCOUNTER — Other Ambulatory Visit: Payer: Self-pay | Admitting: Internal Medicine

## 2015-12-16 DIAGNOSIS — I2699 Other pulmonary embolism without acute cor pulmonale: Secondary | ICD-10-CM | POA: Diagnosis not present

## 2015-12-16 DIAGNOSIS — I8290 Acute embolism and thrombosis of unspecified vein: Secondary | ICD-10-CM | POA: Diagnosis not present

## 2015-12-16 DIAGNOSIS — Z7901 Long term (current) use of anticoagulants: Secondary | ICD-10-CM | POA: Diagnosis not present

## 2015-12-17 DIAGNOSIS — L858 Other specified epidermal thickening: Secondary | ICD-10-CM | POA: Diagnosis not present

## 2015-12-17 DIAGNOSIS — D225 Melanocytic nevi of trunk: Secondary | ICD-10-CM | POA: Diagnosis not present

## 2015-12-17 DIAGNOSIS — D1801 Hemangioma of skin and subcutaneous tissue: Secondary | ICD-10-CM | POA: Diagnosis not present

## 2015-12-17 DIAGNOSIS — Z85828 Personal history of other malignant neoplasm of skin: Secondary | ICD-10-CM | POA: Diagnosis not present

## 2015-12-17 DIAGNOSIS — D485 Neoplasm of uncertain behavior of skin: Secondary | ICD-10-CM | POA: Diagnosis not present

## 2015-12-29 ENCOUNTER — Other Ambulatory Visit: Payer: Self-pay | Admitting: Internal Medicine

## 2016-01-14 DIAGNOSIS — I8290 Acute embolism and thrombosis of unspecified vein: Secondary | ICD-10-CM | POA: Diagnosis not present

## 2016-01-14 DIAGNOSIS — I2699 Other pulmonary embolism without acute cor pulmonale: Secondary | ICD-10-CM | POA: Diagnosis not present

## 2016-01-14 DIAGNOSIS — Z7901 Long term (current) use of anticoagulants: Secondary | ICD-10-CM | POA: Diagnosis not present

## 2016-02-10 ENCOUNTER — Telehealth: Payer: Self-pay | Admitting: Cardiology

## 2016-02-10 ENCOUNTER — Ambulatory Visit (INDEPENDENT_AMBULATORY_CARE_PROVIDER_SITE_OTHER): Payer: Medicare Other | Admitting: *Deleted

## 2016-02-10 DIAGNOSIS — I495 Sick sinus syndrome: Secondary | ICD-10-CM

## 2016-02-10 NOTE — Telephone Encounter (Signed)
Spoke with pt and reminded pt of remote transmission that is due today. Pt verbalized understanding.   

## 2016-02-11 DIAGNOSIS — M2041 Other hammer toe(s) (acquired), right foot: Secondary | ICD-10-CM | POA: Diagnosis not present

## 2016-02-11 DIAGNOSIS — M19071 Primary osteoarthritis, right ankle and foot: Secondary | ICD-10-CM | POA: Diagnosis not present

## 2016-02-11 NOTE — Progress Notes (Signed)
Remote pacemaker transmission.   

## 2016-02-17 ENCOUNTER — Encounter: Payer: Self-pay | Admitting: Cardiology

## 2016-02-18 DIAGNOSIS — Z6822 Body mass index (BMI) 22.0-22.9, adult: Secondary | ICD-10-CM | POA: Diagnosis not present

## 2016-02-18 DIAGNOSIS — I10 Essential (primary) hypertension: Secondary | ICD-10-CM | POA: Diagnosis not present

## 2016-02-18 DIAGNOSIS — Z7901 Long term (current) use of anticoagulants: Secondary | ICD-10-CM | POA: Diagnosis not present

## 2016-02-18 DIAGNOSIS — I8290 Acute embolism and thrombosis of unspecified vein: Secondary | ICD-10-CM | POA: Diagnosis not present

## 2016-02-18 DIAGNOSIS — I2699 Other pulmonary embolism without acute cor pulmonale: Secondary | ICD-10-CM | POA: Diagnosis not present

## 2016-02-23 LAB — CUP PACEART REMOTE DEVICE CHECK
Battery Remaining Longevity: 90 mo
Brady Statistic RA Percent Paced: 96.33 %
Brady Statistic RV Percent Paced: 0.21 %
Date Time Interrogation Session: 20170825145322
Implantable Lead Location: 753860
Implantable Lead Model: 5076
Implantable Lead Model: 5076
Lead Channel Impedance Value: 475 Ohm
Lead Channel Pacing Threshold Pulse Width: 0.4 ms
Lead Channel Sensing Intrinsic Amplitude: 1.625 mV
Lead Channel Setting Pacing Amplitude: 2 V
Lead Channel Setting Sensing Sensitivity: 2.8 mV
MDC IDC LEAD IMPLANT DT: 20060223
MDC IDC LEAD IMPLANT DT: 20060223
MDC IDC LEAD LOCATION: 753859
MDC IDC MSMT BATTERY VOLTAGE: 3.01 V
MDC IDC MSMT LEADCHNL RA IMPEDANCE VALUE: 342 Ohm
MDC IDC MSMT LEADCHNL RA IMPEDANCE VALUE: 456 Ohm
MDC IDC MSMT LEADCHNL RA PACING THRESHOLD AMPLITUDE: 0.625 V
MDC IDC MSMT LEADCHNL RA SENSING INTR AMPL: 1.625 mV
MDC IDC MSMT LEADCHNL RV IMPEDANCE VALUE: 437 Ohm
MDC IDC MSMT LEADCHNL RV PACING THRESHOLD AMPLITUDE: 0.75 V
MDC IDC MSMT LEADCHNL RV PACING THRESHOLD PULSEWIDTH: 0.4 ms
MDC IDC MSMT LEADCHNL RV SENSING INTR AMPL: 6.125 mV
MDC IDC MSMT LEADCHNL RV SENSING INTR AMPL: 6.125 mV
MDC IDC SET LEADCHNL RV PACING AMPLITUDE: 2.5 V
MDC IDC SET LEADCHNL RV PACING PULSEWIDTH: 0.4 ms
MDC IDC STAT BRADY AP VP PERCENT: 0.21 %
MDC IDC STAT BRADY AP VS PERCENT: 96.12 %
MDC IDC STAT BRADY AS VP PERCENT: 0 %
MDC IDC STAT BRADY AS VS PERCENT: 3.67 %

## 2016-03-21 DIAGNOSIS — I2699 Other pulmonary embolism without acute cor pulmonale: Secondary | ICD-10-CM | POA: Diagnosis not present

## 2016-03-21 DIAGNOSIS — Z7901 Long term (current) use of anticoagulants: Secondary | ICD-10-CM | POA: Diagnosis not present

## 2016-03-21 DIAGNOSIS — I8290 Acute embolism and thrombosis of unspecified vein: Secondary | ICD-10-CM | POA: Diagnosis not present

## 2016-03-21 DIAGNOSIS — Z23 Encounter for immunization: Secondary | ICD-10-CM | POA: Diagnosis not present

## 2016-05-08 DIAGNOSIS — N401 Enlarged prostate with lower urinary tract symptoms: Secondary | ICD-10-CM | POA: Diagnosis not present

## 2016-05-08 DIAGNOSIS — R3912 Poor urinary stream: Secondary | ICD-10-CM | POA: Diagnosis not present

## 2016-05-10 DIAGNOSIS — Z125 Encounter for screening for malignant neoplasm of prostate: Secondary | ICD-10-CM | POA: Diagnosis not present

## 2016-05-10 DIAGNOSIS — E784 Other hyperlipidemia: Secondary | ICD-10-CM | POA: Diagnosis not present

## 2016-05-10 DIAGNOSIS — I10 Essential (primary) hypertension: Secondary | ICD-10-CM | POA: Diagnosis not present

## 2016-05-10 DIAGNOSIS — R7301 Impaired fasting glucose: Secondary | ICD-10-CM | POA: Diagnosis not present

## 2016-05-10 DIAGNOSIS — Z7901 Long term (current) use of anticoagulants: Secondary | ICD-10-CM | POA: Diagnosis not present

## 2016-05-17 ENCOUNTER — Ambulatory Visit (INDEPENDENT_AMBULATORY_CARE_PROVIDER_SITE_OTHER): Payer: Medicare Other | Admitting: *Deleted

## 2016-05-17 DIAGNOSIS — I495 Sick sinus syndrome: Secondary | ICD-10-CM

## 2016-05-18 NOTE — Progress Notes (Signed)
Remote pacemaker transmission.   

## 2016-05-19 DIAGNOSIS — Z1389 Encounter for screening for other disorder: Secondary | ICD-10-CM | POA: Diagnosis not present

## 2016-05-19 DIAGNOSIS — Z950 Presence of cardiac pacemaker: Secondary | ICD-10-CM | POA: Diagnosis not present

## 2016-05-19 DIAGNOSIS — Z7901 Long term (current) use of anticoagulants: Secondary | ICD-10-CM | POA: Diagnosis not present

## 2016-05-19 DIAGNOSIS — I2699 Other pulmonary embolism without acute cor pulmonale: Secondary | ICD-10-CM | POA: Diagnosis not present

## 2016-05-19 DIAGNOSIS — R001 Bradycardia, unspecified: Secondary | ICD-10-CM | POA: Diagnosis not present

## 2016-05-19 DIAGNOSIS — I10 Essential (primary) hypertension: Secondary | ICD-10-CM | POA: Diagnosis not present

## 2016-05-19 DIAGNOSIS — R7301 Impaired fasting glucose: Secondary | ICD-10-CM | POA: Diagnosis not present

## 2016-05-19 DIAGNOSIS — I8290 Acute embolism and thrombosis of unspecified vein: Secondary | ICD-10-CM | POA: Diagnosis not present

## 2016-05-19 DIAGNOSIS — I509 Heart failure, unspecified: Secondary | ICD-10-CM | POA: Diagnosis not present

## 2016-05-19 DIAGNOSIS — E784 Other hyperlipidemia: Secondary | ICD-10-CM | POA: Diagnosis not present

## 2016-05-19 DIAGNOSIS — Z0000 Encounter for general adult medical examination without abnormal findings: Secondary | ICD-10-CM | POA: Diagnosis not present

## 2016-05-19 DIAGNOSIS — Z6823 Body mass index (BMI) 23.0-23.9, adult: Secondary | ICD-10-CM | POA: Diagnosis not present

## 2016-05-22 DIAGNOSIS — H2513 Age-related nuclear cataract, bilateral: Secondary | ICD-10-CM | POA: Diagnosis not present

## 2016-05-30 ENCOUNTER — Encounter: Payer: Self-pay | Admitting: Cardiology

## 2016-06-16 DIAGNOSIS — I480 Paroxysmal atrial fibrillation: Secondary | ICD-10-CM | POA: Diagnosis not present

## 2016-06-16 DIAGNOSIS — I2699 Other pulmonary embolism without acute cor pulmonale: Secondary | ICD-10-CM | POA: Diagnosis not present

## 2016-06-16 DIAGNOSIS — I8290 Acute embolism and thrombosis of unspecified vein: Secondary | ICD-10-CM | POA: Diagnosis not present

## 2016-06-20 LAB — CUP PACEART REMOTE DEVICE CHECK
Battery Remaining Longevity: 94 mo
Battery Voltage: 3.01 V
Brady Statistic AP VS Percent: 97.01 %
Brady Statistic RA Percent Paced: 96.96 %
Brady Statistic RV Percent Paced: 0.17 %
Implantable Lead Implant Date: 20060223
Implantable Lead Location: 753860
Implantable Lead Model: 5076
Implantable Lead Model: 5076
Implantable Pulse Generator Implant Date: 20151125
Lead Channel Impedance Value: 456 Ohm
Lead Channel Impedance Value: 513 Ohm
Lead Channel Pacing Threshold Amplitude: 0.625 V
Lead Channel Pacing Threshold Pulse Width: 0.4 ms
Lead Channel Sensing Intrinsic Amplitude: 2.375 mV
Lead Channel Sensing Intrinsic Amplitude: 2.375 mV
Lead Channel Sensing Intrinsic Amplitude: 5.75 mV
Lead Channel Setting Pacing Amplitude: 2 V
Lead Channel Setting Sensing Sensitivity: 2.8 mV
MDC IDC LEAD IMPLANT DT: 20060223
MDC IDC LEAD LOCATION: 753859
MDC IDC MSMT LEADCHNL RA IMPEDANCE VALUE: 361 Ohm
MDC IDC MSMT LEADCHNL RA IMPEDANCE VALUE: 475 Ohm
MDC IDC MSMT LEADCHNL RV PACING THRESHOLD AMPLITUDE: 0.75 V
MDC IDC MSMT LEADCHNL RV PACING THRESHOLD PULSEWIDTH: 0.4 ms
MDC IDC MSMT LEADCHNL RV SENSING INTR AMPL: 5.75 mV
MDC IDC SESS DTM: 20171129180000
MDC IDC SET LEADCHNL RV PACING AMPLITUDE: 2.5 V
MDC IDC SET LEADCHNL RV PACING PULSEWIDTH: 0.4 ms
MDC IDC STAT BRADY AP VP PERCENT: 0.15 %
MDC IDC STAT BRADY AS VP PERCENT: 0 %
MDC IDC STAT BRADY AS VS PERCENT: 2.84 %

## 2016-07-11 DIAGNOSIS — I480 Paroxysmal atrial fibrillation: Secondary | ICD-10-CM | POA: Diagnosis not present

## 2016-07-11 DIAGNOSIS — I8290 Acute embolism and thrombosis of unspecified vein: Secondary | ICD-10-CM | POA: Diagnosis not present

## 2016-07-11 DIAGNOSIS — Z7901 Long term (current) use of anticoagulants: Secondary | ICD-10-CM | POA: Diagnosis not present

## 2016-07-11 DIAGNOSIS — I2699 Other pulmonary embolism without acute cor pulmonale: Secondary | ICD-10-CM | POA: Diagnosis not present

## 2016-07-25 ENCOUNTER — Other Ambulatory Visit: Payer: Self-pay | Admitting: Internal Medicine

## 2016-08-10 DIAGNOSIS — I8290 Acute embolism and thrombosis of unspecified vein: Secondary | ICD-10-CM | POA: Diagnosis not present

## 2016-08-10 DIAGNOSIS — Z7952 Long term (current) use of systemic steroids: Secondary | ICD-10-CM | POA: Diagnosis not present

## 2016-08-10 DIAGNOSIS — I2699 Other pulmonary embolism without acute cor pulmonale: Secondary | ICD-10-CM | POA: Diagnosis not present

## 2016-08-10 DIAGNOSIS — Z7901 Long term (current) use of anticoagulants: Secondary | ICD-10-CM | POA: Diagnosis not present

## 2016-08-14 ENCOUNTER — Ambulatory Visit (INDEPENDENT_AMBULATORY_CARE_PROVIDER_SITE_OTHER): Payer: Medicare Other | Admitting: Internal Medicine

## 2016-08-14 ENCOUNTER — Encounter: Payer: Self-pay | Admitting: Internal Medicine

## 2016-08-14 VITALS — BP 110/60 | HR 68 | Ht 75.0 in | Wt 189.6 lb

## 2016-08-14 DIAGNOSIS — I428 Other cardiomyopathies: Secondary | ICD-10-CM | POA: Diagnosis not present

## 2016-08-14 DIAGNOSIS — I472 Ventricular tachycardia, unspecified: Secondary | ICD-10-CM

## 2016-08-14 DIAGNOSIS — I495 Sick sinus syndrome: Secondary | ICD-10-CM | POA: Diagnosis not present

## 2016-08-14 DIAGNOSIS — Z950 Presence of cardiac pacemaker: Secondary | ICD-10-CM | POA: Diagnosis not present

## 2016-08-14 LAB — CUP PACEART INCLINIC DEVICE CHECK
Battery Remaining Longevity: 87 mo
Battery Voltage: 3.01 V
Brady Statistic AS VP Percent: 0 %
Brady Statistic RA Percent Paced: 96.28 %
Brady Statistic RV Percent Paced: 0.24 %
Date Time Interrogation Session: 20180226135549
Implantable Lead Implant Date: 20060223
Implantable Lead Location: 753860
Implantable Lead Model: 5076
Implantable Lead Model: 5076
Implantable Pulse Generator Implant Date: 20151125
Lead Channel Impedance Value: 342 Ohm
Lead Channel Impedance Value: 494 Ohm
Lead Channel Pacing Threshold Amplitude: 0.5 V
Lead Channel Pacing Threshold Pulse Width: 0.4 ms
Lead Channel Pacing Threshold Pulse Width: 0.4 ms
Lead Channel Sensing Intrinsic Amplitude: 2.375 mV
Lead Channel Sensing Intrinsic Amplitude: 3.25 mV
Lead Channel Setting Pacing Pulse Width: 0.4 ms
Lead Channel Setting Sensing Sensitivity: 2.8 mV
MDC IDC LEAD IMPLANT DT: 20060223
MDC IDC LEAD LOCATION: 753859
MDC IDC MSMT LEADCHNL RA IMPEDANCE VALUE: 475 Ohm
MDC IDC MSMT LEADCHNL RV IMPEDANCE VALUE: 456 Ohm
MDC IDC MSMT LEADCHNL RV PACING THRESHOLD AMPLITUDE: 0.625 V
MDC IDC MSMT LEADCHNL RV SENSING INTR AMPL: 6.125 mV
MDC IDC MSMT LEADCHNL RV SENSING INTR AMPL: 6.5 mV
MDC IDC SET LEADCHNL RA PACING AMPLITUDE: 2 V
MDC IDC SET LEADCHNL RV PACING AMPLITUDE: 2.5 V
MDC IDC STAT BRADY AP VP PERCENT: 0.21 %
MDC IDC STAT BRADY AP VS PERCENT: 96.3 %
MDC IDC STAT BRADY AS VS PERCENT: 3.49 %

## 2016-08-14 NOTE — Progress Notes (Signed)
He would like to      Patient Care Team: Crist Infante, MD as PCP - General   HPI  Steven Macdonald is a 82 y.o. male Seen in followup for AFlutter s/p RFCA He has had no recurrent arrhythmia.  He has a previously implanted dual-chamber Medtronic pacemaker for sinus node dysfunction  He underrwent generator repalcement 11/15  He was seen in the heart failure clinic 12/13. There have been variable reports a left ventricular function initially in the 25% range with interval improvement.  Echocardiogram 1/15 demonstrated an EF of 45-50%. It had been in the 45-55% range now for a couple of years.   He has a history of pulmonary embolism and is on Coumadin.  Called and spoke to Dr Joylene Draft (2/18) re anticoagulation   2.1   2/18    11/17 Hgb  14.8 Cr 1.1   He remains quite active.  The patient denies chest pain, shortness of breath, nocturnal dyspnea, orthopnea or peripheral edema.  There have been no palpitations, lightheadedness or syncope.     Past Medical History:  Diagnosis Date  . Arthritis   . Atrial flutter ([place])    RFCA   . BPH (benign prostatic hypertrophy)   . Cardiomyopathy, nonischemic -resolved    Echo 2008: EF 25% Cardiac CT 2009: Normal cors EF 31% Echo 3/10 EF 45% Echo 6/13: EF 50-55%    . CHF (congestive heart failure) ([place])   . Chicken pox   . Colon polyps    hyperplastic  . Diabetes mellitus ([place])   . ED (erectile dysfunction)   . Gilbert's syndrome    possible  . Hemorrhoids   . Herniated cervical disc   . HLD (hyperlipidemia)   . HTN (hypertension)   . LBBB (left bundle branch block)   . Orthostatic hypotension   . Pacemaker  Medtronic    DOI 2/06  . Precancerous melanosis   . Pulmonary embolism ([place])   . Sinus node dysfunction [facility])         Past Surgical History:  Procedure Laterality Date  . CARDIOVERSION    . coronary ablation    . CYSTOSCOPY W/ TRANSURETHRAL RESECTION OF POSTERIOR URETHERAL VALVES    . PACEMAKER GENERATOR CHANGE N/A  05/13/2014   Procedure: PACEMAKER GENERATOR CHANGE;  Surgeon: Deboraha Sprang, MD;  Location: [facility] CATH LAB;  Service: Cardiovascular;  Laterality: N/A;  . PACEMAKER INSERTION      Current Outpatient Prescriptions  Medication Sig Dispense Refill  . bisoprolol (ZEBETA) 5 MG tablet TAKE 1 TABLET ONCE DAILY. 30 tablet 0  . cholecalciferol (VITAMIN D) 1000 UNITS tablet Take 1,000 Units by mouth daily.    . fish oil-omega-3 fatty acids 1000 MG capsule Take 1 tablet by mouth daily    . lisinopril (PRINIVIL,ZESTRIL) 5 MG tablet Take 2.5 mg by mouth daily.    . Psyllium (METAMUCIL MULTIHEALTH FIBER PO) Take by mouth daily.     . sildenafil (VIAGRA) 100 MG tablet Take 100 mg by mouth daily as needed.      . simvastatin (ZOCOR) 40 MG tablet Take 20 mg by mouth 2 (two) times daily.     Marland Kitchen warfarin (COUMADIN) 7.5 MG tablet Take 7.5 mg by mouth daily. Except on Friday takes 5 mg     No current facility-administered medications for this visit.     No Known Allergies  Review of Systems negative except from HPI and PMH  Physical Exam BP 110/60   Pulse 68   Ht  6\' 3"  (1.905 m)   Wt 189 lb 9.6 oz (86 kg)   SpO2 95%   BMI 23.70 kg/m  Well developed and well nourished in no acute distress HENT normal E scleral and icterus clear Neck Supple JVP flat; carotids brisk and full Clear to ausculation  Regular rate and rhythm, no murmurs gallops or rub Soft with active bowel sounds No clubbing cyanosis  Edema Alert and oriented, grossly normal motor and sensory function Skin Warm and Dry  Atrial pacing @ 62 16/17/46  LBBB   Assessment and  Plan  Sinus node dysfunction  Hypotension  better  Pacemaker-Medtronic The patient's device was interrogated.  The information was reviewed. No changes were made in the programming.    Nonischemic cardiomyopathy-resolved  LBBB   Pulmonary Embolism  Chronic therapy  Stable  Cardiac status  Blood pressure is still on the low side. I'm in favor of  decreasing his beta blocker and potentially increasing his lisinopril. He will discuss this with Dr. Wonda Amis when he sees him next.  On Anticoagulation;  No bleeding issues   He is not interested in pursuing NOACs. Not withstanding benefit assessments  Rate excursion is adequate to limitations on exercise. No reprogramming   Left bundle branch block is stable.

## 2016-08-14 NOTE — Patient Instructions (Addendum)
Medication Instructions: - Your physician recommends that you continue on your current medications as directed. Please refer to the Current Medication list given to you today.  Labwork: - none ordered  Procedures/Testing: - none ordered  Follow-Up: - Remote monitoring is used to monitor your Pacemaker of ICD from home. This monitoring reduces the number of office visits required to check your device to one time per year. It allows [place] to keep an eye on the functioning of your device to ensure it is working properly. You are scheduled for a device check from home on 11/14/16. You may send your transmission at any time that day. If you have a wireless device, the transmission will be sent automatically. After your physician reviews your transmission, you will receive a postcard with your next transmission date.  - Your physician wants you to follow-up in: 1 year with Dr. Caryl Comes. You will receive a reminder letter in the mail two months in advance. If you don't receive a letter, please call our office to schedule the follow-up appointment.   Any Additional Special Instructions Will Be Listed Below (If Applicable).     If you need a refill on your cardiac medications before your next appointment, please call your pharmacy.

## 2016-08-26 ENCOUNTER — Other Ambulatory Visit: Payer: Self-pay | Admitting: Internal Medicine

## 2016-09-07 DIAGNOSIS — Z7901 Long term (current) use of anticoagulants: Secondary | ICD-10-CM | POA: Diagnosis not present

## 2016-09-07 DIAGNOSIS — I10 Essential (primary) hypertension: Secondary | ICD-10-CM | POA: Diagnosis not present

## 2016-09-07 DIAGNOSIS — I480 Paroxysmal atrial fibrillation: Secondary | ICD-10-CM | POA: Diagnosis not present

## 2016-09-07 DIAGNOSIS — I8290 Acute embolism and thrombosis of unspecified vein: Secondary | ICD-10-CM | POA: Diagnosis not present

## 2016-09-07 DIAGNOSIS — I2699 Other pulmonary embolism without acute cor pulmonale: Secondary | ICD-10-CM | POA: Diagnosis not present

## 2016-10-13 DIAGNOSIS — Z7901 Long term (current) use of anticoagulants: Secondary | ICD-10-CM | POA: Diagnosis not present

## 2016-10-13 DIAGNOSIS — I480 Paroxysmal atrial fibrillation: Secondary | ICD-10-CM | POA: Diagnosis not present

## 2016-10-13 DIAGNOSIS — I2699 Other pulmonary embolism without acute cor pulmonale: Secondary | ICD-10-CM | POA: Diagnosis not present

## 2016-10-13 DIAGNOSIS — I8290 Acute embolism and thrombosis of unspecified vein: Secondary | ICD-10-CM | POA: Diagnosis not present

## 2016-11-03 DIAGNOSIS — Z85828 Personal history of other malignant neoplasm of skin: Secondary | ICD-10-CM | POA: Diagnosis not present

## 2016-11-03 DIAGNOSIS — L858 Other specified epidermal thickening: Secondary | ICD-10-CM | POA: Diagnosis not present

## 2016-11-14 ENCOUNTER — Ambulatory Visit (INDEPENDENT_AMBULATORY_CARE_PROVIDER_SITE_OTHER): Payer: Medicare Other | Admitting: *Deleted

## 2016-11-14 DIAGNOSIS — Z950 Presence of cardiac pacemaker: Secondary | ICD-10-CM | POA: Diagnosis not present

## 2016-11-14 DIAGNOSIS — I495 Sick sinus syndrome: Secondary | ICD-10-CM | POA: Diagnosis not present

## 2016-11-15 LAB — CUP PACEART REMOTE DEVICE CHECK
Battery Remaining Longevity: 85 mo
Brady Statistic AP VS Percent: 95.01 %
Brady Statistic AS VP Percent: 0.01 %
Brady Statistic AS VS Percent: 4.46 %
Brady Statistic RV Percent Paced: 0.65 %
Implantable Lead Implant Date: 20060223
Implantable Lead Location: 753859
Implantable Lead Model: 5076
Implantable Lead Model: 5076
Lead Channel Impedance Value: 342 Ohm
Lead Channel Impedance Value: 380 Ohm
Lead Channel Impedance Value: 456 Ohm
Lead Channel Pacing Threshold Amplitude: 0.625 V
Lead Channel Pacing Threshold Pulse Width: 0.4 ms
Lead Channel Sensing Intrinsic Amplitude: 2.875 mV
Lead Channel Sensing Intrinsic Amplitude: 2.875 mV
Lead Channel Sensing Intrinsic Amplitude: 6.5 mV
Lead Channel Sensing Intrinsic Amplitude: 6.5 mV
Lead Channel Setting Pacing Amplitude: 2 V
Lead Channel Setting Pacing Amplitude: 2.5 V
Lead Channel Setting Pacing Pulse Width: 0.4 ms
Lead Channel Setting Sensing Sensitivity: 2.8 mV
MDC IDC LEAD IMPLANT DT: 20060223
MDC IDC LEAD LOCATION: 753860
MDC IDC MSMT BATTERY VOLTAGE: 3.01 V
MDC IDC MSMT LEADCHNL RA PACING THRESHOLD AMPLITUDE: 0.375 V
MDC IDC MSMT LEADCHNL RA PACING THRESHOLD PULSEWIDTH: 0.4 ms
MDC IDC MSMT LEADCHNL RV IMPEDANCE VALUE: 494 Ohm
MDC IDC PG IMPLANT DT: 20151125
MDC IDC SESS DTM: 20180529172753
MDC IDC STAT BRADY AP VP PERCENT: 0.52 %
MDC IDC STAT BRADY RA PERCENT PACED: 93.9 %

## 2016-11-16 ENCOUNTER — Encounter: Payer: Self-pay | Admitting: Vascular Surgery

## 2016-11-16 DIAGNOSIS — I8290 Acute embolism and thrombosis of unspecified vein: Secondary | ICD-10-CM | POA: Diagnosis not present

## 2016-11-16 DIAGNOSIS — R2242 Localized swelling, mass and lump, left lower limb: Secondary | ICD-10-CM | POA: Diagnosis not present

## 2016-11-16 DIAGNOSIS — I83812 Varicose veins of left lower extremities with pain: Secondary | ICD-10-CM | POA: Diagnosis not present

## 2016-11-16 DIAGNOSIS — I480 Paroxysmal atrial fibrillation: Secondary | ICD-10-CM | POA: Diagnosis not present

## 2016-11-16 DIAGNOSIS — I2699 Other pulmonary embolism without acute cor pulmonale: Secondary | ICD-10-CM | POA: Diagnosis not present

## 2016-11-16 DIAGNOSIS — Z7901 Long term (current) use of anticoagulants: Secondary | ICD-10-CM | POA: Diagnosis not present

## 2016-11-17 ENCOUNTER — Ambulatory Visit (INDEPENDENT_AMBULATORY_CARE_PROVIDER_SITE_OTHER): Payer: Medicare Other | Admitting: Vascular Surgery

## 2016-11-17 ENCOUNTER — Encounter: Payer: Self-pay | Admitting: Vascular Surgery

## 2016-11-17 VITALS — BP 105/70 | HR 60 | Temp 97.2°F | Resp 20 | Ht 75.0 in | Wt 185.7 lb

## 2016-11-17 DIAGNOSIS — I8290 Acute embolism and thrombosis of unspecified vein: Secondary | ICD-10-CM | POA: Diagnosis not present

## 2016-11-17 DIAGNOSIS — I872 Venous insufficiency (chronic) (peripheral): Secondary | ICD-10-CM

## 2016-11-17 NOTE — Progress Notes (Signed)
Requested by:  Crist Infante, [address] [place], [place] [id number]  Reason for consultation: left calf mass    History of Present Illness   Steven Macdonald is a 82 y.o. (26-Mar-1935) male who presents with chief complaint: swelling of lower leg.  Patient has longstanding history of asx varicose veins with prior history of DVT with PE.  Patient is not certain which leg the DVT was in.  Over the last 3 days, he has developed a discolored "lump" in the left lower leg.  The patient denies pain per se but does have some mild discomfort there.  The patient has had known history of DVT, known history of varicose vein, no history of venous stasis ulcers, no history of  Lymphedema and no history of skin changes in lower legs.  There is no family history of venous disorders.  The patient has never used compression stockings in the past.   Past Medical History:  Diagnosis Date  . Arthritis   . Atrial flutter ([place])    RFCA   . BPH (benign prostatic hypertrophy)   . Cardiomyopathy, nonischemic -resolved    Echo 2008: EF 25% Cardiac CT 2009: Normal cors EF 31% Echo 3/10 EF 45% Echo 6/13: EF 50-55%    . CHF (congestive heart failure) ([place])   . Chicken pox   . Colon polyps    hyperplastic  . Diabetes mellitus ([place])   . ED (erectile dysfunction)   . Gilbert's syndrome    possible  . Hemorrhoids   . Herniated cervical disc   . HLD (hyperlipidemia)   . HTN (hypertension)   . LBBB (left bundle branch block)   . Orthostatic hypotension   . Pacemaker  Medtronic    DOI 2/06  . Precancerous melanosis   . Pulmonary embolism ([place])   . Sinus node dysfunction [facility])         Past Surgical History:  Procedure Laterality Date  . CARDIOVERSION    . coronary ablation    . CYSTOSCOPY W/ TRANSURETHRAL RESECTION OF POSTERIOR URETHERAL VALVES    . PACEMAKER GENERATOR CHANGE N/A 05/13/2014   Procedure: PACEMAKER GENERATOR CHANGE;  Surgeon: Deboraha Sprang, MD;  Location[facility] CATH LAB;  Service:  Cardiovascular;  Laterality: N/A;  . PACEMAKER INSERTION      Social History   Social History  . Marital status: Married    Spouse name: N/A  . Number of children: 4  . Years of education: N/A   Occupational History  . lawyer   .  Self-Employed   Social History Main Topics  . Smoking status: Former Smoker    Quit date: 10/17/1970  . Smokeless tobacco: Never Used  . Alcohol use Yes     Comment: 2 glasses wine daily  . Drug use: No  . Sexual activity: Not on file   Other Topics Concern  . Not on file   Social History Narrative  . No narrative on file    Family History  Problem Relation Age of Onset  . Breast cancer Mother   . Colon polyps Father        pre-cancerous  . Bladder Cancer Father     Current Outpatient Prescriptions  Medication Sig Dispense Refill  . bisoprolol (ZEBETA) 5 MG tablet TAKE 1 TABLET ONCE DAILY. 30 tablet 0  . Calcium-Magnesium-Vitamin D (CALCIUM 500 PO) Take by mouth daily.    . cholecalciferol (VITAMIN D) 1000 UNITS tablet Take 1,000 Units by mouth daily.    Marland Kitchen  fish oil-omega-3 fatty acids 1000 MG capsule Take 1 tablet by mouth daily    . lisinopril (PRINIVIL,ZESTRIL) 5 MG tablet Take 2.5 mg by mouth daily.    . Psyllium (METAMUCIL MULTIHEALTH FIBER PO) Take by mouth daily.     . sildenafil (VIAGRA) 100 MG tablet Take 100 mg by mouth daily as needed.      . simvastatin (ZOCOR) 40 MG tablet Take 20 mg by mouth 2 (two) times daily.     Marland Kitchen warfarin (COUMADIN) 7.5 MG tablet Take 7.5 mg by mouth daily. Except on Friday takes 5 mg     No current facility-administered medications for this visit.     No Known Allergies  REVIEW OF SYSTEMS (negative unless checked):   Cardiac:  []  Chest pain or chest pressure? []  Shortness of breath upon activity? []  Shortness of breath when lying flat? []  Irregular heart rhythm?  Vascular:  [x]  Pain in calf, thigh, or hip brought on by walking? []  Pain in feet at night that wakes you up from your  sleep? []  Blood clot in your veins? []  Leg swelling?  Pulmonary:  []  Oxygen at home? []  Productive cough? []  Wheezing?  Neurologic:  []  Sudden weakness in arms or legs? []  Sudden numbness in arms or legs? []  Sudden onset of difficult speaking or slurred speech? []  Temporary loss of vision in one eye? []  Problems with dizziness?  Gastrointestinal:  []  Blood in stool? []  Vomited blood?  Genitourinary:  []  Burning when urinating? []  Blood in urine?  Psychiatric:  []  Major depression  Hematologic:  []  Bleeding problems? []  Problems with blood clotting?  Dermatologic:  []  Rashes or ulcers?  Constitutional:  []  Fever or chills?  Ear/Nose/Throat:  []  Change in hearing? []  Nose bleeds? []  Sore throat?  Musculoskeletal:  []  Back pain? []  Joint pain? []  Muscle pain?   Physical Examination   Vitals:   11/17/16 0912  BP: 105/70  Pulse: 60  Resp: 20  Temp: 97.2 F (36.2 C)  TempSrc: Oral  SpO2: 97%  Weight: 185 lb 11.2 oz (84.2 kg)  Height: 6\' 3"  (1.905 m)    Body mass index is 23.21 kg/m.  General Alert, O x 3, WD, NAD  [place]/AT,    Ear/Nose/Throat Hearing grossly intact, nares without erythema or drainage, oropharynx without Erythema or Exudate, Mallampati score: 3, Dentition intact  Eyes PERRLA, EOMI,    Neck Supple, mid-line trachea,    Pulmonary Sym exp, good B air movt, CTA B  Cardiac RRR, Nl S1, S2, no Murmurs, No rubs, No S3,S4  Vascular Vessel Right Left  Radial Palpable Palpable  Brachial Palpable Palpable  Carotid Palpable Palpable  Aorta Not palpable due to pannus N/A  Femoral Palpable Palpable  Popliteal Not palpable Not palpable  PT Palpable Palpable  DP Palpable Palpable    Gastrointestinal soft, non-distended, non-tender to palpation, No guarding or rebound, no HSM, no masses, no CVAT B, No palpable prominent aortic pulse,    Musculoskeletal M/S 5/5 throughout  , Extremities without ischemic changes  , No edema present, Large  varicose veins in both legs, No Lipodermatosclerosis present, semi-firm erythematous mass extending off a branch of the greater saphenous vein suspicious for SVT  Neurologic Cranial nerves 2-12 intact  , Pain and light touch intact in extremities  , Motor exam as listed above  Psychiatric Judgement intact, Mood & affect appropriate for pt's clinical situation  Dermatologic See M/S exam for extremity exam, No rashes otherwise noted  Lymphatic  Palpable lymph nodes: None    Non-invasive Vascular Imaging   Outside Venous Duplex (11/17/15):   No DVT  Complex lesion in the lower calf demonstrates nonspecific imaging characteristics but may contain calcifications  I reviewed the venous duplex report and there are some report details missing that normally should be present, leading me to suspect the study   Outside Studies/Documentation   2 pages of outside documents were reviewed including: outside venous duplex.   Medical Decision Making   MCLEAN MOYA is a 82 y.o. male who presents with: BLE chronic venous insufficiency (C2), varicose veins with complications, likely thrombosed L calf nest of varicosities vs. traumatized varicose veins   Would manage the left calf superficial venous thrombosis conservatively with prn NSAID and warm compress.  If mass worsens or extends or get infected, might need excision but I doubt it.  Based on the patient's history and examination, I recommend: compressive therapy.  Given patient's relatively asx status, I doubt there is a need for venous insufficiency duplex to check valve closure times.  If the patient develops, CVI sx I would obtain such.  Thank you for allowing [place] to participate in this patient's care.   Adele Barthel, MD, FACS [facility] Office: [phone] Pager: [phone]  11/17/2016, 9:34 AM

## 2016-11-22 ENCOUNTER — Encounter: Payer: Self-pay | Admitting: Internal Medicine

## 2016-11-24 ENCOUNTER — Encounter: Payer: Self-pay | Admitting: Cardiology

## 2016-12-22 DIAGNOSIS — I8290 Acute embolism and thrombosis of unspecified vein: Secondary | ICD-10-CM | POA: Diagnosis not present

## 2016-12-22 DIAGNOSIS — Z7901 Long term (current) use of anticoagulants: Secondary | ICD-10-CM | POA: Diagnosis not present

## 2016-12-22 DIAGNOSIS — I2699 Other pulmonary embolism without acute cor pulmonale: Secondary | ICD-10-CM | POA: Diagnosis not present

## 2016-12-22 DIAGNOSIS — I480 Paroxysmal atrial fibrillation: Secondary | ICD-10-CM | POA: Diagnosis not present

## 2017-01-26 DIAGNOSIS — I480 Paroxysmal atrial fibrillation: Secondary | ICD-10-CM | POA: Diagnosis not present

## 2017-01-26 DIAGNOSIS — I8290 Acute embolism and thrombosis of unspecified vein: Secondary | ICD-10-CM | POA: Diagnosis not present

## 2017-01-26 DIAGNOSIS — Z7901 Long term (current) use of anticoagulants: Secondary | ICD-10-CM | POA: Diagnosis not present

## 2017-01-26 DIAGNOSIS — I2699 Other pulmonary embolism without acute cor pulmonale: Secondary | ICD-10-CM | POA: Diagnosis not present

## 2017-02-13 ENCOUNTER — Ambulatory Visit (INDEPENDENT_AMBULATORY_CARE_PROVIDER_SITE_OTHER): Payer: Medicare Other | Admitting: *Deleted

## 2017-02-13 DIAGNOSIS — I495 Sick sinus syndrome: Secondary | ICD-10-CM

## 2017-02-13 NOTE — Progress Notes (Signed)
Remote pacemaker transmission.   

## 2017-02-14 LAB — CUP PACEART REMOTE DEVICE CHECK
Brady Statistic AP VP Percent: 0.45 %
Brady Statistic AP VS Percent: 93.31 %
Brady Statistic AS VP Percent: 0.01 %
Brady Statistic RA Percent Paced: 92.84 %
Brady Statistic RV Percent Paced: 0.55 %
Date Time Interrogation Session: 20180828142044
Implantable Lead Implant Date: 20060223
Implantable Lead Location: 753860
Implantable Lead Model: 5076
Lead Channel Impedance Value: 456 Ohm
Lead Channel Impedance Value: 513 Ohm
Lead Channel Pacing Threshold Pulse Width: 0.4 ms
Lead Channel Sensing Intrinsic Amplitude: 6 mV
Lead Channel Sensing Intrinsic Amplitude: 6 mV
Lead Channel Setting Pacing Amplitude: 2 V
Lead Channel Setting Pacing Amplitude: 2.5 V
Lead Channel Setting Pacing Pulse Width: 0.4 ms
Lead Channel Setting Sensing Sensitivity: 2.8 mV
MDC IDC LEAD IMPLANT DT: 20060223
MDC IDC LEAD LOCATION: 753859
MDC IDC MSMT BATTERY REMAINING LONGEVITY: 83 mo
MDC IDC MSMT BATTERY VOLTAGE: 3.01 V
MDC IDC MSMT LEADCHNL RA IMPEDANCE VALUE: 361 Ohm
MDC IDC MSMT LEADCHNL RA IMPEDANCE VALUE: 475 Ohm
MDC IDC MSMT LEADCHNL RA PACING THRESHOLD AMPLITUDE: 0.5 V
MDC IDC MSMT LEADCHNL RA SENSING INTR AMPL: 2.875 mV
MDC IDC MSMT LEADCHNL RA SENSING INTR AMPL: 2.875 mV
MDC IDC MSMT LEADCHNL RV PACING THRESHOLD AMPLITUDE: 0.75 V
MDC IDC MSMT LEADCHNL RV PACING THRESHOLD PULSEWIDTH: 0.4 ms
MDC IDC PG IMPLANT DT: 20151125
MDC IDC STAT BRADY AS VS PERCENT: 6.24 %

## 2017-02-22 DIAGNOSIS — Z23 Encounter for immunization: Secondary | ICD-10-CM | POA: Diagnosis not present

## 2017-02-22 DIAGNOSIS — Z7901 Long term (current) use of anticoagulants: Secondary | ICD-10-CM | POA: Diagnosis not present

## 2017-02-22 DIAGNOSIS — I2699 Other pulmonary embolism without acute cor pulmonale: Secondary | ICD-10-CM | POA: Diagnosis not present

## 2017-02-22 DIAGNOSIS — I8290 Acute embolism and thrombosis of unspecified vein: Secondary | ICD-10-CM | POA: Diagnosis not present

## 2017-02-22 DIAGNOSIS — I480 Paroxysmal atrial fibrillation: Secondary | ICD-10-CM | POA: Diagnosis not present

## 2017-02-23 ENCOUNTER — Encounter: Payer: Self-pay | Admitting: Cardiology

## 2017-03-26 ENCOUNTER — Emergency Department (HOSPITAL_COMMUNITY)
Admission: EM | Admit: 2017-03-26 | Discharge: 2017-03-26 | Disposition: A | Discharge status: Home / Self Care | Payer: Medicare Other | Attending: Emergency Medicine | Admitting: Emergency Medicine

## 2017-03-26 ENCOUNTER — Encounter (HOSPITAL_COMMUNITY): Payer: Self-pay | Admitting: Emergency Medicine

## 2017-03-26 DIAGNOSIS — E119 Type 2 diabetes mellitus without complications: Secondary | ICD-10-CM | POA: Insufficient documentation

## 2017-03-26 DIAGNOSIS — M436 Torticollis: Secondary | ICD-10-CM | POA: Insufficient documentation

## 2017-03-26 DIAGNOSIS — Z87891 Personal history of nicotine dependence: Secondary | ICD-10-CM | POA: Diagnosis not present

## 2017-03-26 DIAGNOSIS — M542 Cervicalgia: Secondary | ICD-10-CM | POA: Diagnosis present

## 2017-03-26 DIAGNOSIS — I509 Heart failure, unspecified: Secondary | ICD-10-CM | POA: Insufficient documentation

## 2017-03-26 DIAGNOSIS — Z79899 Other long term (current) drug therapy: Secondary | ICD-10-CM | POA: Diagnosis not present

## 2017-03-26 DIAGNOSIS — I110 Hypertensive heart disease with heart failure: Secondary | ICD-10-CM | POA: Insufficient documentation

## 2017-03-26 DIAGNOSIS — Z7901 Long term (current) use of anticoagulants: Secondary | ICD-10-CM | POA: Diagnosis not present

## 2017-03-26 LAB — CBC
HCT: 43.3 % (ref 39.0–52.0)
HEMOGLOBIN: 14.4 g/dL (ref 13.0–17.0)
MCH: 28.7 pg (ref 26.0–34.0)
MCHC: 33.3 g/dL (ref 30.0–36.0)
MCV: 86.4 fL (ref 78.0–100.0)
PLATELETS: 120 10*3/uL — AB (ref 150–400)
RBC: 5.01 MIL/uL (ref 4.22–5.81)
RDW: 14.3 % (ref 11.5–15.5)
WBC: 7.6 10*3/uL (ref 4.0–10.5)

## 2017-03-26 LAB — BASIC METABOLIC PANEL
ANION GAP: 8 (ref 5–15)
BUN: 19 mg/dL (ref 6–20)
CALCIUM: 9.2 mg/dL (ref 8.9–10.3)
CO2: 23 mmol/L (ref 22–32)
Chloride: 105 mmol/L (ref 101–111)
Creatinine, Ser: 0.98 mg/dL (ref 0.61–1.24)
Glucose, Bld: 110 mg/dL — ABNORMAL HIGH (ref 65–99)
Potassium: 4.1 mmol/L (ref 3.5–5.1)
SODIUM: 136 mmol/L (ref 135–145)

## 2017-03-26 LAB — I-STAT TROPONIN, ED: TROPONIN I, POC: 0.01 ng/mL (ref 0.00–0.08)

## 2017-03-26 MED ORDER — PREDNISONE 20 MG PO TABS
20.0000 mg | ORAL_TABLET | Freq: Once | ORAL | Status: AC
  Administered 2017-03-26: 20 mg via ORAL
  Filled 2017-03-26: qty 1

## 2017-03-26 MED ORDER — CYCLOBENZAPRINE HCL 10 MG PO TABS: 10.0000 mg | ORAL_TABLET | Freq: Two times a day (BID) | ORAL | 0 refills | Status: DC | PRN

## 2017-03-26 MED ORDER — CYCLOBENZAPRINE HCL 10 MG PO TABS
10.0000 mg | ORAL_TABLET | Freq: Once | ORAL | Status: AC
  Administered 2017-03-26: 10 mg via ORAL
  Filled 2017-03-26: qty 1

## 2017-03-26 MED ORDER — PREDNISONE 10 MG PO TABS: 20.0000 mg | ORAL_TABLET | Freq: Two times a day (BID) | ORAL | 0 refills | Status: DC

## 2017-03-26 NOTE — ED Provider Notes (Signed)
[facility] DEPT Provider Note   CSN: [id number] Arrival date & time: 03/26/17  0144     History   Chief Complaint Chief Complaint  Patient presents with  . Neck Pain  . Shoulder Pain    HPI Steven Macdonald is a 82 y.o. male.  Patient is a 82 year old male with past medical history of sick sinus, left bundle branch block, and cardiomyopathy. He has a pacemaker. He presents today for evaluation of pain in his left neck. This started 2 days ago. It is worse with turning his head to the left. He denies any radiation into his arm. He denies any numbness or tingling. He denies any chest pain or difficulty breathing. He denies any specific injury or trauma, but does report running a chainsaw several days ago preceding the symptoms. His wife felt as though he may have a fever this evening and was concerned about the possibility of meningitis.   The history is provided by the patient.  Neck Pain   This is a new problem. The current episode started yesterday. The problem occurs constantly. The problem has been rapidly worsening. The pain is associated with nothing. There has been no fever. The pain is present in the left side. The quality of the pain is described as stabbing. The pain does not radiate. The pain is moderate. The pain is the same all the time. Pertinent negatives include no numbness, no bowel incontinence, no bladder incontinence, no paresis, no tingling and no weakness.  Shoulder Pain   Pertinent negatives include no numbness and no tingling.    Past Medical History:  Diagnosis Date  . Arthritis   . Atrial flutter ([place])    RFCA   . BPH (benign prostatic hypertrophy)   . Cardiomyopathy, nonischemic -resolved    Echo 2008: EF 25% Cardiac CT 2009: Normal cors EF 31% Echo 3/10 EF 45% Echo 6/13: EF 50-55%    . CHF (congestive heart failure) ([place])   . Chicken pox   . Colon polyps    hyperplastic  . Diabetes mellitus ([place])   . ED (erectile dysfunction)   . Gilbert's  syndrome    possible  . Hemorrhoids   . Herniated cervical disc   . HLD (hyperlipidemia)   . HTN (hypertension)   . LBBB (left bundle branch block)   . Orthostatic hypotension   . Pacemaker  Medtronic    DOI 2/06  . 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[place])   . Pulmonary embolism ([place])   . Sinus node dysfunction [facility])         Patient Active Problem List   Diagnosis Date Noted  . Precordial chest pain 07/17/2013  . Pulmonary embolism ([place]) 10/17/2012  . Nonischemic cardiomyopathy ([place]) 06/04/2012  . Pacemaker-Medtronic 04/11/2011  . HYPERTENSION, BENIGN 05/24/2009  . LBBB 10/06/2008  . SICK SINUS/ TACHY-BRADY SYNDROME 10/06/2008  . HYPERLIPIDEMIA 08/27/2008  . CARDIOMYOPATHY--resolved 08/27/2008    Past Surgical History:  Procedure Laterality Date  . CARDIOVERSION    . coronary ablation    . CYSTOSCOPY W/ TRANSURETHRAL RESECTION OF POSTERIOR URETHERAL VALVES    . PACEMAKER GENERATOR CHANGE N/A 05/13/2014   Procedure: PACEMAKER GENERATOR CHANGE;  Surgeon: Deboraha Sprang, MD;  Location: [facility] CATH LAB;  Service: Cardiovascular;  Laterality: N/A;  . PACEMAKER INSERTION         Home Medications    Prior to Admission medications   Medication Sig Start Date End Date Taking? Authorizing Provider  bisoprolol (ZEBETA) 5 MG tablet TAKE 1 TABLET ONCE DAILY.  07/25/16   Bensimhon, Shaune Pascal, MD  Calcium-Magnesium-Vitamin D (CALCIUM 500 PO) Take by mouth daily.    [provider]  cholecalciferol (VITAMIN D) 1000 UNITS tablet Take 1,000 Units by mouth daily.    [provider]  fish oil-omega-3 fatty acids 1000 MG capsule Take 1 tablet by mouth daily    [provider]  lisinopril (PRINIVIL,ZESTRIL) 5 MG tablet Take 2.5 mg by mouth daily.    [provider]  Psyllium (METAMUCIL MULTIHEALTH FIBER PO) Take by mouth daily.     [provider]  sildenafil (VIAGRA) 100 MG tablet Take 100 mg by mouth daily as needed.      [provider]  simvastatin  (ZOCOR) 40 MG tablet Take 20 mg by mouth 2 (two) times daily.     [provider]  warfarin (COUMADIN) 7.5 MG tablet Take 7.5 mg by mouth daily. Except on Friday takes 5 mg    [provider]    Family History Family History  Problem Relation Age of Onset  . Breast cancer Mother   . Colon polyps Father        pre-cancerous  . Bladder Cancer Father     Social History Social History  Substance Use Topics  . Smoking status: Former Smoker    Quit date: 10/17/1970  . Smokeless tobacco: Never Used  . Alcohol use Yes     Comment: 2 glasses wine daily     Allergies   Patient has no known allergies.   Review of Systems Review of Systems  Gastrointestinal: Negative for bowel incontinence.  Genitourinary: Negative for bladder incontinence.  Musculoskeletal: Positive for neck pain.  Neurological: Negative for tingling, weakness and numbness.  All other systems reviewed and are negative.    Physical Exam Updated Vital Signs BP 126/74 (BP Location: Right Arm)   Pulse 76   Temp (!) 97.4 F (36.3 C) (Oral)   Resp 18   Ht 6\' 3"  (1.905 m)   Wt 81.6 kg (180 lb)   SpO2 96%   BMI 22.50 kg/m   Physical Exam  Constitutional: He is oriented to person, place, and time. He appears well-developed and well-nourished. No distress.  HENT:  Head: Normocephalic and atraumatic.  Mouth/Throat: Oropharynx is clear and moist.  Neck: Normal range of motion. Neck supple.  There is tenderness to palpation in the soft tissues of the upper trapezius muscle and soft tissues in the left lateral cervical region. Pain is exacerbated with turning head to the left.  Cardiovascular: Normal rate and regular rhythm.  Exam reveals no friction rub.   No murmur heard. Pulmonary/Chest: Effort normal and breath sounds normal. No respiratory distress. He has no wheezes. He has no rales.  Abdominal: Soft. Bowel sounds are normal. He exhibits no distension. There is no tenderness.    Musculoskeletal: Normal range of motion. He exhibits no edema.  Lymphadenopathy:    He has no cervical adenopathy.  Neurological: He is alert and oriented to person, place, and time. Coordination normal.  Skin: Skin is warm and dry. He is not diaphoretic.  Nursing note and vitals reviewed.    ED Treatments / Results  Labs (all labs ordered are listed, but only abnormal results are displayed) Labs Reviewed  BASIC METABOLIC PANEL - Abnormal; Notable for the following:       Result Value   Glucose, Bld 110 (*)    All other components within normal limits  CBC - Abnormal; Notable for the following:  Platelets 120 (*)    All other components within normal limits  I-STAT TROPONIN, ED    EKG  EKG Interpretation None       Radiology No results found.  Procedures Procedures (including critical care time)  Medications Ordered in ED Medications  predniSONE (DELTASONE) tablet 20 mg (not administered)  cyclobenzaprine (FLEXERIL) tablet 10 mg (not administered)     Initial Impression / Assessment and Plan / ED Course  I have reviewed the triage vital signs and the nursing notes.  Pertinent labs & imaging results that were available during my care of the patient were reviewed by me and considered in my medical decision making (see chart for details).  Patient symptoms seem very musculoskeletal in nature. I have very little suspicion of meningitis. He has no fever and no white count. His symptoms are worse with palpation and when he turns his head to the left. He appears to have torticollis, likely from operating a chain saw several days ago. He will be treated with prednisone, Flexeril, and follow-up as needed.  Final Clinical Impressions(s) / ED Diagnoses   Final diagnoses:  None    New Prescriptions New Prescriptions   No medications on file     Veryl Speak, MD 03/26/17 [phone]

## 2017-03-26 NOTE — Discharge Instructions (Signed)
Prednisone as prescribed.  Flexeril as prescribed as needed for pain.  I'll put your primary Dr. if not improving in the next week, and return to the ER if symptoms significantly worsen or change in the meantime.

## 2017-03-26 NOTE — ED Notes (Signed)
Pt departed in NAD, refused use of wheelchair.  

## 2017-03-26 NOTE — ED Notes (Signed)
Pt and family report recent travel to [place] in the last month.

## 2017-03-26 NOTE — ED Triage Notes (Signed)
Pt c/o neck pain that radiates to the left shoulder. Symptoms began yesterday and progressed throughout the day. Pt reports fevers and a cold symptoms. Denies shortness of breath/nausea/vomiting/diaphoresis.

## 2017-03-28 ENCOUNTER — Encounter (HOSPITAL_COMMUNITY): Payer: Self-pay | Admitting: Emergency Medicine

## 2017-03-28 ENCOUNTER — Emergency Department (HOSPITAL_COMMUNITY)
Admission: EM | Admit: 2017-03-28 | Discharge: 2017-03-29 | Disposition: A | Discharge status: Home / Self Care | Payer: Medicare Other | Attending: Emergency Medicine | Admitting: Emergency Medicine

## 2017-03-28 DIAGNOSIS — R339 Retention of urine, unspecified: Secondary | ICD-10-CM | POA: Diagnosis not present

## 2017-03-28 DIAGNOSIS — I4892 Unspecified atrial flutter: Secondary | ICD-10-CM | POA: Diagnosis not present

## 2017-03-28 DIAGNOSIS — E119 Type 2 diabetes mellitus without complications: Secondary | ICD-10-CM | POA: Insufficient documentation

## 2017-03-28 DIAGNOSIS — Z87891 Personal history of nicotine dependence: Secondary | ICD-10-CM | POA: Diagnosis not present

## 2017-03-28 DIAGNOSIS — Z7901 Long term (current) use of anticoagulants: Secondary | ICD-10-CM | POA: Diagnosis not present

## 2017-03-28 DIAGNOSIS — I10 Essential (primary) hypertension: Secondary | ICD-10-CM | POA: Insufficient documentation

## 2017-03-28 DIAGNOSIS — Z950 Presence of cardiac pacemaker: Secondary | ICD-10-CM | POA: Insufficient documentation

## 2017-03-28 DIAGNOSIS — Z79899 Other long term (current) drug therapy: Secondary | ICD-10-CM | POA: Diagnosis not present

## 2017-03-28 NOTE — ED Triage Notes (Signed)
Pt c/o being unable to void x 5 hours. Hx urinary retention and catheters. Abdomen distended, bladder scan volume >999.

## 2017-03-29 DIAGNOSIS — R339 Retention of urine, unspecified: Secondary | ICD-10-CM | POA: Diagnosis not present

## 2017-03-29 DIAGNOSIS — I480 Paroxysmal atrial fibrillation: Secondary | ICD-10-CM | POA: Diagnosis not present

## 2017-03-29 DIAGNOSIS — I8290 Acute embolism and thrombosis of unspecified vein: Secondary | ICD-10-CM | POA: Diagnosis not present

## 2017-03-29 DIAGNOSIS — Z7901 Long term (current) use of anticoagulants: Secondary | ICD-10-CM | POA: Diagnosis not present

## 2017-03-29 DIAGNOSIS — I2699 Other pulmonary embolism without acute cor pulmonale: Secondary | ICD-10-CM | POA: Diagnosis not present

## 2017-03-29 LAB — COMPREHENSIVE METABOLIC PANEL
ALK PHOS: 55 U/L (ref 38–126)
ALT: 19 U/L (ref 17–63)
ANION GAP: 7 (ref 5–15)
AST: 27 U/L (ref 15–41)
Albumin: 3.8 g/dL (ref 3.5–5.0)
BUN: 16 mg/dL (ref 6–20)
CALCIUM: 9.6 mg/dL (ref 8.9–10.3)
CO2: 26 mmol/L (ref 22–32)
Chloride: 104 mmol/L (ref 101–111)
Creatinine, Ser: 0.96 mg/dL (ref 0.61–1.24)
Glucose, Bld: 90 mg/dL (ref 65–99)
Potassium: 3.9 mmol/L (ref 3.5–5.1)
SODIUM: 137 mmol/L (ref 135–145)
Total Bilirubin: 1.5 mg/dL — ABNORMAL HIGH (ref 0.3–1.2)
Total Protein: 6.4 g/dL — ABNORMAL LOW (ref 6.5–8.1)

## 2017-03-29 LAB — PROTIME-INR
INR: 2.15
PROTHROMBIN TIME: 23.8 s — AB (ref 11.4–15.2)

## 2017-03-29 LAB — CBC
HCT: 44.2 % (ref 39.0–52.0)
HEMOGLOBIN: 14.5 g/dL (ref 13.0–17.0)
MCH: 28.5 pg (ref 26.0–34.0)
MCHC: 32.8 g/dL (ref 30.0–36.0)
MCV: 87 fL (ref 78.0–100.0)
PLATELETS: 142 10*3/uL — AB (ref 150–400)
RBC: 5.08 MIL/uL (ref 4.22–5.81)
RDW: 14.3 % (ref 11.5–15.5)
WBC: 7.1 10*3/uL (ref 4.0–10.5)

## 2017-03-29 LAB — URINALYSIS, ROUTINE W REFLEX MICROSCOPIC
Bilirubin Urine: NEGATIVE
Glucose, UA: NEGATIVE mg/dL
HGB URINE DIPSTICK: NEGATIVE
Ketones, ur: NEGATIVE mg/dL
LEUKOCYTES UA: NEGATIVE
NITRITE: NEGATIVE
PROTEIN: NEGATIVE mg/dL
SPECIFIC GRAVITY, URINE: 1.004 — AB (ref 1.005–1.030)
pH: 6 (ref 5.0–8.0)

## 2017-03-29 LAB — LIPASE, BLOOD: LIPASE: 32 U/L (ref 11–51)

## 2017-03-29 NOTE — ED Notes (Signed)
Pt in and out catheterized by RN with 1475 mL of urine obtained.

## 2017-03-29 NOTE — ED Provider Notes (Signed)
TIME SEEN: 1:25 AM  CHIEF COMPLAINT: Urinary retention  HPI: Patient is a 82 year old male with history of nonischemic cardiomyopathy, atrial flutter on Coumadin, BPH status post TURP who presents to the emergency department with urinary retention. Patient states was last able to urinate around 7 PM last night. Had significant discomfort with a bladder scan of over a liter. Patient reports he has done well without Foley catheter before. Requesting that we leave it out today. No fevers, nausea, vomiting or diarrhea. He was recently put on prednisone and Flexeril for muscle spasm of his neck. No other new medications. No midline neck or back pain, numbness, tingling or focal weakness. No overflow incontinence. No fecal incontinence.  ROS: See HPI Constitutional: no fever  Eyes: no drainage  ENT: no runny nose   Cardiovascular:  no chest pain  Resp: no SOB  GI: no vomiting GU: no dysuria Integumentary: no rash  Allergy: no hives  Musculoskeletal: no leg swelling  Neurological: no slurred speech ROS otherwise negative  PAST MEDICAL HISTORY/PAST SURGICAL HISTORY:  Past Medical History:  Diagnosis Date  . Arthritis   . Atrial flutter ([place])    RFCA   . BPH (benign prostatic hypertrophy)   . Cardiomyopathy, nonischemic -resolved    Echo 2008: EF 25% Cardiac CT 2009: Normal cors EF 31% Echo 3/10 EF 45% Echo 6/13: EF 50-55%    . CHF (congestive heart failure) ([place])   . Chicken pox   . Colon polyps    hyperplastic  . Diabetes mellitus ([place])   . ED (erectile dysfunction)   . Gilbert's syndrome    possible  . Hemorrhoids   . Herniated cervical disc   . HLD (hyperlipidemia)   . HTN (hypertension)   . LBBB (left bundle branch block)   . Orthostatic hypotension   . Pacemaker  Medtronic    DOI 2/06  . 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 Pulmonary embolism ([place])   . Sinus node dysfunction ([facility])         MEDICATIONS:  Prior to Admission medications   Medication Sig Start Date End Date  Taking? Authorizing Provider  bisoprolol (ZEBETA) 5 MG tablet TAKE 1 TABLET ONCE DAILY. 07/25/16   Bensimhon, Shaune Pascal, MD  Calcium-Magnesium-Vitamin D (CALCIUM 500 PO) Take by mouth daily.    [provider]  cholecalciferol (VITAMIN D) 1000 UNITS tablet Take 1,000 Units by mouth daily.    [provider]  cyclobenzaprine (FLEXERIL) 10 MG tablet Take 1 tablet (10 mg total) by mouth 2 (two) times daily as needed for muscle spasms. 03/26/17   Veryl Speak, MD  fish oil-omega-3 fatty acids 1000 MG capsule Take 1 tablet by mouth daily    [provider]  lisinopril (PRINIVIL,ZESTRIL) 5 MG tablet Take 2.5 mg by mouth daily.    [provider]  predniSONE (DELTASONE) 10 MG tablet Take 2 tablets (20 mg total) by mouth 2 (two) times daily with a meal. 03/26/17   Delo, Nathaneil Canary, MD  Psyllium (METAMUCIL MULTIHEALTH FIBER PO) Take by mouth daily.     [provider]  sildenafil (VIAGRA) 100 MG tablet Take 100 mg by mouth daily as needed.      [provider]  simvastatin (ZOCOR) 40 MG tablet Take 20 mg by mouth 2 (two) times daily.     [provider]  warfarin (COUMADIN) 7.5 MG tablet Take 7.5 mg by mouth daily. Except on Friday takes 5 mg    [provider]    ALLERGIES:  No Known Allergies  SOCIAL HISTORY:  Social History  Substance Use Topics  . Smoking status: Former Smoker    Quit date: 10/17/1970  . Smokeless tobacco: Never Used  . Alcohol use Yes     Comment: 2 glasses wine daily    FAMILY HISTORY: Family History  Problem Relation Age of Onset  . Breast cancer Mother   . Colon polyps Father        pre-cancerous  . Bladder Cancer Father     EXAM: BP 120/83 (BP Location: Right Arm)   Pulse 60   Temp (!) 97.5 F (36.4 C) (Oral)   Resp 16   Ht 6\' 3"  (1.905 m)   Wt 81.6 kg (180 lb)   SpO2 97%   BMI 22.50 kg/m  CONSTITUTIONAL: Alert and oriented and responds appropriately to questions. Well-appearing;  well-nourished, Elderly, appears comfortable now, afebrile HEAD: Normocephalic EYES: Conjunctivae clear, pupils appear equal, EOMI ENT: normal nose; moist mucous membranes NECK: Supple, no meningismus, no nuchal rigidity, no LAD; no midline spinal tenderness or step-off or deformity CARD: RRR; S1 and S2 appreciated; no murmurs, no clicks, no rubs, no gallops RESP: Normal chest excursion without splinting or tachypnea; breath sounds clear and equal bilaterally; no wheezes, no rhonchi, no rales, no hypoxia or respiratory distress, speaking full sentences ABD/GI: Normal bowel sounds; non-distended; soft, non-tender, no rebound, no guarding, no peritoneal signs, no hepatosplenomegaly BACK:  The back appears normal and is non-tender to palpation, there is no CVA tenderness, no midline spinal tenderness or step-off or deformity EXT: Normal ROM in all joints; non-tender to palpation; no edema; normal capillary refill; no cyanosis, no calf tenderness or swelling    SKIN: Normal color for age and race; warm; no rash NEURO: Moves all extremities equally, normal sensation diffusely, normal gait PSYCH: The patient's mood and manner are appropriate. Grooming and personal hygiene are appropriate.  MEDICAL DECISION MAKING: Patient here with urinary retention. Has history of BPH. Patient refuses Foley catheter. We have been able to successfully in and out cath patient with 1475 mL of urine drained. Labs, urine pending. He has no focal neurologic deficits to suggest cauda equina, spinal stenosis. Will check for urinary tract infection. Reports feeling much better. Discussed with him that he will need to be able to void on his own in the emergency department prior to discharge.  ED PROGRESS: 2:20 AM Labs unremarkable. Urine shows no sign of infection. Normal creatinine. Patient still trying to void on his own. He has had several cups of water and coffee. We'll continue to monitor.   3:45 AM  Pt unable to urinate  and feels more distended.  Unable to urinate. We will place a Foley catheter. Patient agrees. He will follow-up with his urologist at [facility] urology. Discussed Foley catheter care. Patient and wife comfortable with this plan.   At this time, I do not feel there is any life-threatening condition present. I have reviewed and discussed all results (EKG, imaging, lab, urine as appropriate) and exam findings with patient/family. I have reviewed nursing notes and appropriate previous records.  I feel the patient is safe to be discharged home without further emergent workup and can continue workup as an outpatient as needed. Discussed usual and customary return precautions. Patient/family verbalize understanding and are comfortable with this plan.  Outpatient follow-up has been provided if needed. All questions have been answered.     Genaro Bekker, Delice Bison, DO 03/29/17 [phone]

## 2017-03-29 NOTE — Discharge Instructions (Signed)
Your labs and urine were reassuring. No sign of urinary tract infection. Please keep your Foley catheter in and follow-up with urology.

## 2017-04-02 DIAGNOSIS — R339 Retention of urine, unspecified: Secondary | ICD-10-CM | POA: Diagnosis not present

## 2017-04-12 DIAGNOSIS — R339 Retention of urine, unspecified: Secondary | ICD-10-CM | POA: Diagnosis not present

## 2017-04-13 DIAGNOSIS — R339 Retention of urine, unspecified: Secondary | ICD-10-CM | POA: Diagnosis not present

## 2017-04-16 DIAGNOSIS — R339 Retention of urine, unspecified: Secondary | ICD-10-CM | POA: Diagnosis not present

## 2017-04-16 DIAGNOSIS — N401 Enlarged prostate with lower urinary tract symptoms: Secondary | ICD-10-CM | POA: Diagnosis not present

## 2017-04-16 DIAGNOSIS — N3020 Other chronic cystitis without hematuria: Secondary | ICD-10-CM | POA: Diagnosis not present

## 2017-04-25 DIAGNOSIS — N400 Enlarged prostate without lower urinary tract symptoms: Secondary | ICD-10-CM | POA: Insufficient documentation

## 2017-04-26 DIAGNOSIS — I2699 Other pulmonary embolism without acute cor pulmonale: Secondary | ICD-10-CM | POA: Diagnosis not present

## 2017-04-26 DIAGNOSIS — I480 Paroxysmal atrial fibrillation: Secondary | ICD-10-CM | POA: Diagnosis not present

## 2017-04-26 DIAGNOSIS — Z7901 Long term (current) use of anticoagulants: Secondary | ICD-10-CM | POA: Diagnosis not present

## 2017-04-26 DIAGNOSIS — I8290 Acute embolism and thrombosis of unspecified vein: Secondary | ICD-10-CM | POA: Diagnosis not present

## 2017-04-27 DIAGNOSIS — R338 Other retention of urine: Secondary | ICD-10-CM | POA: Diagnosis not present

## 2017-04-27 DIAGNOSIS — N401 Enlarged prostate with lower urinary tract symptoms: Secondary | ICD-10-CM | POA: Diagnosis not present

## 2017-05-15 ENCOUNTER — Ambulatory Visit (INDEPENDENT_AMBULATORY_CARE_PROVIDER_SITE_OTHER): Payer: Medicare Other | Admitting: *Deleted

## 2017-05-15 DIAGNOSIS — I495 Sick sinus syndrome: Secondary | ICD-10-CM

## 2017-05-16 ENCOUNTER — Telehealth: Payer: Self-pay | Admitting: Cardiology

## 2017-05-16 NOTE — Telephone Encounter (Signed)
Opened in error

## 2017-05-16 NOTE — Progress Notes (Signed)
Remote pacemaker transmission.   

## 2017-05-18 ENCOUNTER — Encounter: Payer: Self-pay | Admitting: Cardiology

## 2017-05-18 LAB — CUP PACEART REMOTE DEVICE CHECK
Battery Voltage: 3.01 V
Brady Statistic AP VP Percent: 1.09 %
Brady Statistic AS VP Percent: 0.11 %
Brady Statistic RA Percent Paced: 81.41 %
Brady Statistic RV Percent Paced: 1.38 %
Date Time Interrogation Session: 20181127153621
Implantable Lead Location: 753860
Implantable Lead Model: 5076
Implantable Pulse Generator Implant Date: 20151125
Lead Channel Impedance Value: 342 Ohm
Lead Channel Impedance Value: 437 Ohm
Lead Channel Impedance Value: 494 Ohm
Lead Channel Pacing Threshold Pulse Width: 0.4 ms
Lead Channel Pacing Threshold Pulse Width: 0.4 ms
Lead Channel Sensing Intrinsic Amplitude: 5.375 mV
Lead Channel Setting Pacing Amplitude: 2 V
Lead Channel Setting Pacing Pulse Width: 0.4 ms
MDC IDC LEAD IMPLANT DT: 20060223
MDC IDC LEAD IMPLANT DT: 20060223
MDC IDC LEAD LOCATION: 753859
MDC IDC MSMT BATTERY REMAINING LONGEVITY: 82 mo
MDC IDC MSMT LEADCHNL RA IMPEDANCE VALUE: 456 Ohm
MDC IDC MSMT LEADCHNL RA PACING THRESHOLD AMPLITUDE: 0.625 V
MDC IDC MSMT LEADCHNL RA SENSING INTR AMPL: 2.875 mV
MDC IDC MSMT LEADCHNL RA SENSING INTR AMPL: 2.875 mV
MDC IDC MSMT LEADCHNL RV PACING THRESHOLD AMPLITUDE: 0.75 V
MDC IDC MSMT LEADCHNL RV SENSING INTR AMPL: 5.375 mV
MDC IDC SET LEADCHNL RV PACING AMPLITUDE: 2.5 V
MDC IDC SET LEADCHNL RV SENSING SENSITIVITY: 2.8 mV
MDC IDC STAT BRADY AP VS PERCENT: 82.01 %
MDC IDC STAT BRADY AS VS PERCENT: 16.78 %

## 2017-05-24 DIAGNOSIS — Z7901 Long term (current) use of anticoagulants: Secondary | ICD-10-CM | POA: Diagnosis not present

## 2017-05-24 DIAGNOSIS — M5136 Other intervertebral disc degeneration, lumbar region: Secondary | ICD-10-CM | POA: Diagnosis not present

## 2017-05-24 DIAGNOSIS — I2699 Other pulmonary embolism without acute cor pulmonale: Secondary | ICD-10-CM | POA: Diagnosis not present

## 2017-05-24 DIAGNOSIS — I8290 Acute embolism and thrombosis of unspecified vein: Secondary | ICD-10-CM | POA: Diagnosis not present

## 2017-05-24 DIAGNOSIS — I480 Paroxysmal atrial fibrillation: Secondary | ICD-10-CM | POA: Diagnosis not present

## 2017-06-04 ENCOUNTER — Ambulatory Visit (INDEPENDENT_AMBULATORY_CARE_PROVIDER_SITE_OTHER): Payer: Medicare Other | Admitting: Physician Assistant

## 2017-06-04 ENCOUNTER — Encounter (INDEPENDENT_AMBULATORY_CARE_PROVIDER_SITE_OTHER): Payer: Self-pay | Admitting: Physician Assistant

## 2017-06-04 ENCOUNTER — Ambulatory Visit (INDEPENDENT_AMBULATORY_CARE_PROVIDER_SITE_OTHER): Payer: Medicare Other

## 2017-06-04 VITALS — Ht 75.0 in | Wt 180.0 lb

## 2017-06-04 DIAGNOSIS — M545 Low back pain, unspecified: Secondary | ICD-10-CM

## 2017-06-04 NOTE — Progress Notes (Signed)
Office Visit Note   Patient: Steven Macdonald           Date of Birth: 1934/11/11           MRN: [id number] Visit Date: 06/04/2017              Requested by: Crist Infante, MD [address] [place], [place] [address] PCP: Crist Infante, MD   Assessment & Plan: Visit Diagnoses:  1. Acute left-sided low back pain without sciatica     Plan: We will have him go to physical therapy for hamstring stretching back exercises home exercise program modalities.  Have him follow-up in a month check his progress lack of.  He will talk to Dr. Joylene Draft about his concerns for a possible abdominal mass see if he feels there is some type of scan is necessary.  Follow-Up Instructions: Return in about 4 weeks (around 07/02/2017).   Orders:  Orders Placed This Encounter  Procedures  . XR Lumbar Spine 2-3 Views   No orders of the defined types were placed in this encounter.     Procedures: No procedures performed   Clinical Data: No additional findings.   Subjective: Chief Complaint  Patient presents with  . Lower Back - Pain    HPI Steven Macdonald is a 82 year old male who was seen for the first time for some low back pain for the past 2-3 months.  No known injury.  Pain is worse with standing for prolonged period of time or walking.  He has no waking pain.  He denies any radicular symptoms down either leg.  May next pain is left lower lumbar region.  He has tried no treatments for his back pain.  He denies any bowel bladder incontinence.  He is worried about a possible abdominal mass causing his low back pain.  He states that he took some Flexeril for some neck pain and feels that this caused him some urinary retention problems.  He also is on Coumadin and does not like to take any medications unless absolutely necessary. History of MRI of lumbar spine -2004 showed multi-level degenerative changes lumbar spine with a small posterior lateral disc protrusion at the level of L2-3 and a shallow left  paracentral disc protrusion at L5-S1.  By foraminal narrowing present at L3 through L5 S1.  No large disc herniation or high-grade central stenosis was present.   Review of Systems Denies fevers,, chills, nausea, vomiting, shortness of breath, or chest pain.  Please see HPI otherwise negative  Objective: Vital Signs: Ht 6\' 3"  (1.905 m)   Wt 180 lb (81.6 kg)   BMI 22.50 kg/m   Physical Exam  Constitutional: He is oriented to person, place, and time. He appears well-developed and well-nourished. No distress.  Cardiovascular: Intact distal pulses.  Pulmonary/Chest: Effort normal.  Neurological: He is alert and oriented to person, place, and time.  Skin: He is not diaphoretic.  Psychiatric: He has a normal mood and affect.    Ortho Exam 5 out of 5 strength throughout the lower extremities against resistance.  Negative straight leg raise bilaterally exquisitely tight hamstrings.  Greater than 6 inches of being able to touch his toes.  He has limited extension of the lumbar spine.  Deep tendon reflexes are 1+ at the knees and ankles and equal and symmetric.  Sensation intact bilateral feet.  Dorsal pedal pulses are intact bilaterally.  Good range of motion of both hips.  He has no tenderness with palpation along the lumbar spine  or paraspinous region  Specialty Comments:  No specialty comments available.  Imaging: Xr Lumbar Spine 2-3 Views  Result Date: 06/04/2017 Lumbar spine AP lateral views shows endplate spurring throughout.  Degenerative disc disease with moderate narrowing at L4-5 and severe narrowing at L5-S1.  Loss of lordotic curvature is noted.  No acute fractures.  No spondylolisthesis.    PMFS History: Patient Active Problem List   Diagnosis Date Noted  . Precordial chest pain 07/17/2013  . Pulmonary embolism ([place]) 10/17/2012  . Nonischemic cardiomyopathy ([place]) 06/04/2012  . Pacemaker-Medtronic 04/11/2011  . HYPERTENSION, BENIGN 05/24/2009  . LBBB 10/06/2008  . SICK  SINUS/ TACHY-BRADY SYNDROME 10/06/2008  . HYPERLIPIDEMIA 08/27/2008  . CARDIOMYOPATHY--resolved 08/27/2008   Past Medical History:  Diagnosis Date  . Arthritis   . Atrial flutter ([place])    RFCA   . BPH (benign prostatic hypertrophy)   . Cardiomyopathy, nonischemic -resolved    Echo 2008: EF 25% Cardiac CT 2009: Normal cors EF 31% Echo 3/10 EF 45% Echo 6/13: EF 50-55%    . CHF (congestive heart failure) ([place])   . Chicken pox   . Colon polyps    hyperplastic  . Diabetes mellitus ([place])   . ED (erectile dysfunction)   . Gilbert's syndrome    possible  . Hemorrhoids   . Herniated cervical disc   . HLD (hyperlipidemia)   . HTN (hypertension)   . LBBB (left bundle branch block)   . Orthostatic hypotension   . Pacemaker  Medtronic    DOI 2/06  . 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 Pulmonary embolism ([place])   . Sinus node dysfunction ([facility])         Family History  Problem Relation Age of Onset  . Breast cancer Mother   . Colon polyps Father        pre-cancerous  . Bladder Cancer Father     Past Surgical History:  Procedure Laterality Date  . CARDIOVERSION    . coronary ablation    . CYSTOSCOPY W/ TRANSURETHRAL RESECTION OF POSTERIOR URETHERAL VALVES    . PACEMAKER GENERATOR CHANGE N/A 05/13/2014   Procedure: PACEMAKER GENERATOR CHANGE;  Surgeon: Deboraha Sprang, MD;  Location[facility] C[facility] LAB;  Service: Cardiovascular;  Laterality: N/A;  . PACEMAKER INSERTION     Social History   Occupational History  . Occupation: Insurance underwriter: SELF-EMPLOYED  Tobacco Use  . Smoking status: Former Smoker    Last attempt to quit: 10/17/1970    Years since quitting: 46.6  . Smokeless tobacco: Never Used  Substance and Sexual Activity  . Alcohol use: Yes    Comment: 2 glasses wine daily  . Drug use: No  . Sexual activity: Not on file

## 2017-06-08 DIAGNOSIS — M545 Low back pain: Secondary | ICD-10-CM | POA: Diagnosis not present

## 2017-06-13 DIAGNOSIS — M545 Low back pain: Secondary | ICD-10-CM | POA: Diagnosis not present

## 2017-06-15 DIAGNOSIS — N401 Enlarged prostate with lower urinary tract symptoms: Secondary | ICD-10-CM | POA: Diagnosis not present

## 2017-06-15 DIAGNOSIS — N138 Other obstructive and reflux uropathy: Secondary | ICD-10-CM | POA: Diagnosis not present

## 2017-06-21 DIAGNOSIS — L84 Corns and callosities: Secondary | ICD-10-CM | POA: Diagnosis not present

## 2017-06-21 DIAGNOSIS — Z85828 Personal history of other malignant neoplasm of skin: Secondary | ICD-10-CM | POA: Diagnosis not present

## 2017-06-21 DIAGNOSIS — D692 Other nonthrombocytopenic purpura: Secondary | ICD-10-CM | POA: Diagnosis not present

## 2017-06-25 DIAGNOSIS — R634 Abnormal weight loss: Secondary | ICD-10-CM | POA: Diagnosis not present

## 2017-06-25 DIAGNOSIS — Z7901 Long term (current) use of anticoagulants: Secondary | ICD-10-CM | POA: Diagnosis not present

## 2017-06-25 DIAGNOSIS — N401 Enlarged prostate with lower urinary tract symptoms: Secondary | ICD-10-CM | POA: Diagnosis not present

## 2017-06-25 DIAGNOSIS — M545 Low back pain: Secondary | ICD-10-CM | POA: Diagnosis not present

## 2017-06-25 DIAGNOSIS — I480 Paroxysmal atrial fibrillation: Secondary | ICD-10-CM | POA: Diagnosis not present

## 2017-07-03 DIAGNOSIS — I10 Essential (primary) hypertension: Secondary | ICD-10-CM | POA: Diagnosis not present

## 2017-07-03 DIAGNOSIS — Z1212 Encounter for screening for malignant neoplasm of rectum: Secondary | ICD-10-CM | POA: Diagnosis not present

## 2017-07-09 ENCOUNTER — Ambulatory Visit (INDEPENDENT_AMBULATORY_CARE_PROVIDER_SITE_OTHER): Payer: Medicare Other | Admitting: Orthopaedic Surgery

## 2017-07-09 ENCOUNTER — Encounter (INDEPENDENT_AMBULATORY_CARE_PROVIDER_SITE_OTHER): Payer: Self-pay | Admitting: Orthopaedic Surgery

## 2017-07-09 DIAGNOSIS — M545 Low back pain, unspecified: Secondary | ICD-10-CM

## 2017-07-09 NOTE — Progress Notes (Signed)
The patient is following up after having been to physical therapy for acute left-sided sciatica.  He says physical therapy is done wonderful he is doing great overall.  He is a very active 83 years old.  He denies any radicular symptoms at this point going down his left leg and he feels better overall.  On exam he is mobilizing without any assistive device.  He gets up from a chair easily.  He has negative straight leg raise bilaterally.  He has excellent strength in his bilateral lower extremities and normal sensation.  At this point he is already with a home exercise program.  He will follow-up as needed.  All questions and concerns were answered and addressed.

## 2017-07-12 DIAGNOSIS — R338 Other retention of urine: Secondary | ICD-10-CM | POA: Diagnosis not present

## 2017-07-12 DIAGNOSIS — Z1389 Encounter for screening for other disorder: Secondary | ICD-10-CM | POA: Diagnosis not present

## 2017-07-12 DIAGNOSIS — E291 Testicular hypofunction: Secondary | ICD-10-CM | POA: Diagnosis not present

## 2017-07-12 DIAGNOSIS — N529 Male erectile dysfunction, unspecified: Secondary | ICD-10-CM | POA: Diagnosis not present

## 2017-07-12 DIAGNOSIS — R634 Abnormal weight loss: Secondary | ICD-10-CM | POA: Diagnosis not present

## 2017-07-12 DIAGNOSIS — N528 Other male erectile dysfunction: Secondary | ICD-10-CM | POA: Diagnosis not present

## 2017-07-12 DIAGNOSIS — F329 Major depressive disorder, single episode, unspecified: Secondary | ICD-10-CM | POA: Diagnosis not present

## 2017-07-12 DIAGNOSIS — F3289 Other specified depressive episodes: Secondary | ICD-10-CM | POA: Diagnosis not present

## 2017-07-12 DIAGNOSIS — Z6823 Body mass index (BMI) 23.0-23.9, adult: Secondary | ICD-10-CM | POA: Diagnosis not present

## 2017-07-16 ENCOUNTER — Other Ambulatory Visit: Payer: Self-pay | Admitting: Internal Medicine

## 2017-07-25 ENCOUNTER — Other Ambulatory Visit: Payer: Self-pay | Admitting: Internal Medicine

## 2017-07-25 DIAGNOSIS — R7989 Other specified abnormal findings of blood chemistry: Secondary | ICD-10-CM

## 2017-07-25 DIAGNOSIS — Z86711 Personal history of pulmonary embolism: Secondary | ICD-10-CM

## 2017-07-25 DIAGNOSIS — R634 Abnormal weight loss: Secondary | ICD-10-CM

## 2017-07-25 DIAGNOSIS — R945 Abnormal results of liver function studies: Secondary | ICD-10-CM

## 2017-07-26 ENCOUNTER — Ambulatory Visit
Admission: RE | Admit: 2017-07-26 | Discharge: 2017-07-26 | Disposition: A | Discharge status: Home / Self Care | Payer: Medicare Other | Source: Ambulatory Visit | Attending: Internal Medicine | Admitting: Internal Medicine

## 2017-07-26 DIAGNOSIS — I700 Atherosclerosis of aorta: Secondary | ICD-10-CM | POA: Diagnosis not present

## 2017-07-26 DIAGNOSIS — Z86711 Personal history of pulmonary embolism: Secondary | ICD-10-CM

## 2017-07-26 MED ORDER — IOPAMIDOL (ISOVUE-370) INJECTION 76%
75.0000 mL | Freq: Once | INTRAVENOUS | Status: AC | PRN
  Administered 2017-07-26: 75 mL via INTRAVENOUS

## 2017-07-27 DIAGNOSIS — Z6823 Body mass index (BMI) 23.0-23.9, adult: Secondary | ICD-10-CM | POA: Diagnosis not present

## 2017-07-27 DIAGNOSIS — Z7901 Long term (current) use of anticoagulants: Secondary | ICD-10-CM | POA: Diagnosis not present

## 2017-08-02 DIAGNOSIS — I255 Ischemic cardiomyopathy: Secondary | ICD-10-CM | POA: Diagnosis present

## 2017-08-10 DIAGNOSIS — Z7901 Long term (current) use of anticoagulants: Secondary | ICD-10-CM | POA: Diagnosis not present

## 2017-08-10 DIAGNOSIS — I8290 Acute embolism and thrombosis of unspecified vein: Secondary | ICD-10-CM | POA: Diagnosis not present

## 2017-08-14 ENCOUNTER — Ambulatory Visit (INDEPENDENT_AMBULATORY_CARE_PROVIDER_SITE_OTHER): Payer: Medicare Other | Admitting: *Deleted

## 2017-08-14 DIAGNOSIS — I495 Sick sinus syndrome: Secondary | ICD-10-CM | POA: Diagnosis not present

## 2017-08-15 NOTE — Progress Notes (Signed)
Remote pacemaker transmission.   

## 2017-08-16 ENCOUNTER — Encounter: Payer: Self-pay | Admitting: Cardiology

## 2017-08-29 LAB — CUP PACEART REMOTE DEVICE CHECK
Brady Statistic AP VS Percent: 72.61 %
Brady Statistic AS VP Percent: 0.1 %
Brady Statistic RA Percent Paced: 71.77 %
Brady Statistic RV Percent Paced: 0.87 %
Date Time Interrogation Session: 20190226154141
Implantable Lead Implant Date: 20060223
Implantable Lead Location: 753860
Implantable Lead Model: 5076
Implantable Lead Model: 5076
Implantable Pulse Generator Implant Date: 20151125
Lead Channel Impedance Value: 361 Ohm
Lead Channel Impedance Value: 494 Ohm
Lead Channel Pacing Threshold Pulse Width: 0.4 ms
Lead Channel Sensing Intrinsic Amplitude: 3.75 mV
Lead Channel Sensing Intrinsic Amplitude: 5.25 mV
Lead Channel Sensing Intrinsic Amplitude: 5.25 mV
Lead Channel Setting Pacing Amplitude: 2.5 V
Lead Channel Setting Pacing Pulse Width: 0.4 ms
MDC IDC LEAD IMPLANT DT: 20060223
MDC IDC LEAD LOCATION: 753859
MDC IDC MSMT BATTERY REMAINING LONGEVITY: 83 mo
MDC IDC MSMT BATTERY VOLTAGE: 3.01 V
MDC IDC MSMT LEADCHNL RA IMPEDANCE VALUE: 475 Ohm
MDC IDC MSMT LEADCHNL RA PACING THRESHOLD AMPLITUDE: 0.5 V
MDC IDC MSMT LEADCHNL RA PACING THRESHOLD PULSEWIDTH: 0.4 ms
MDC IDC MSMT LEADCHNL RA SENSING INTR AMPL: 3.75 mV
MDC IDC MSMT LEADCHNL RV IMPEDANCE VALUE: 418 Ohm
MDC IDC MSMT LEADCHNL RV PACING THRESHOLD AMPLITUDE: 0.75 V
MDC IDC SET LEADCHNL RA PACING AMPLITUDE: 2 V
MDC IDC SET LEADCHNL RV SENSING SENSITIVITY: 2.8 mV
MDC IDC STAT BRADY AP VP PERCENT: 0.66 %
MDC IDC STAT BRADY AS VS PERCENT: 26.63 %

## 2017-09-06 DIAGNOSIS — R7301 Impaired fasting glucose: Secondary | ICD-10-CM | POA: Diagnosis not present

## 2017-09-06 DIAGNOSIS — E785 Hyperlipidemia, unspecified: Secondary | ICD-10-CM | POA: Diagnosis not present

## 2017-09-06 DIAGNOSIS — Z125 Encounter for screening for malignant neoplasm of prostate: Secondary | ICD-10-CM | POA: Diagnosis not present

## 2017-09-06 DIAGNOSIS — Z7901 Long term (current) use of anticoagulants: Secondary | ICD-10-CM | POA: Diagnosis not present

## 2017-09-06 DIAGNOSIS — Z0000 Encounter for general adult medical examination without abnormal findings: Secondary | ICD-10-CM | POA: Diagnosis not present

## 2017-09-06 DIAGNOSIS — R945 Abnormal results of liver function studies: Secondary | ICD-10-CM | POA: Diagnosis not present

## 2017-09-06 DIAGNOSIS — R339 Retention of urine, unspecified: Secondary | ICD-10-CM | POA: Diagnosis not present

## 2017-09-06 DIAGNOSIS — R82998 Other abnormal findings in urine: Secondary | ICD-10-CM | POA: Diagnosis not present

## 2017-09-06 DIAGNOSIS — Z1389 Encounter for screening for other disorder: Secondary | ICD-10-CM | POA: Diagnosis not present

## 2017-09-06 DIAGNOSIS — R634 Abnormal weight loss: Secondary | ICD-10-CM | POA: Diagnosis not present

## 2017-09-06 DIAGNOSIS — F329 Major depressive disorder, single episode, unspecified: Secondary | ICD-10-CM | POA: Diagnosis not present

## 2017-09-06 DIAGNOSIS — I480 Paroxysmal atrial fibrillation: Secondary | ICD-10-CM | POA: Diagnosis not present

## 2017-09-06 DIAGNOSIS — I10 Essential (primary) hypertension: Secondary | ICD-10-CM | POA: Diagnosis not present

## 2017-09-06 DIAGNOSIS — E291 Testicular hypofunction: Secondary | ICD-10-CM | POA: Diagnosis not present

## 2017-09-06 DIAGNOSIS — Z6823 Body mass index (BMI) 23.0-23.9, adult: Secondary | ICD-10-CM | POA: Diagnosis not present

## 2017-09-06 DIAGNOSIS — K4090 Unilateral inguinal hernia, without obstruction or gangrene, not specified as recurrent: Secondary | ICD-10-CM | POA: Diagnosis not present

## 2017-09-06 DIAGNOSIS — H6122 Impacted cerumen, left ear: Secondary | ICD-10-CM | POA: Diagnosis not present

## 2017-09-06 DIAGNOSIS — I83812 Varicose veins of left lower extremities with pain: Secondary | ICD-10-CM | POA: Diagnosis not present

## 2017-09-10 ENCOUNTER — Encounter: Payer: Self-pay | Admitting: Internal Medicine

## 2017-09-10 ENCOUNTER — Ambulatory Visit (INDEPENDENT_AMBULATORY_CARE_PROVIDER_SITE_OTHER): Payer: Medicare Other | Admitting: Internal Medicine

## 2017-09-10 VITALS — BP 110/54 | HR 81 | Ht 75.0 in | Wt 183.0 lb

## 2017-09-10 DIAGNOSIS — I447 Left bundle-branch block, unspecified: Secondary | ICD-10-CM

## 2017-09-10 DIAGNOSIS — Z950 Presence of cardiac pacemaker: Secondary | ICD-10-CM | POA: Diagnosis not present

## 2017-09-10 DIAGNOSIS — I495 Sick sinus syndrome: Secondary | ICD-10-CM

## 2017-09-10 DIAGNOSIS — I428 Other cardiomyopathies: Secondary | ICD-10-CM | POA: Diagnosis not present

## 2017-09-10 DIAGNOSIS — R0609 Other forms of dyspnea: Secondary | ICD-10-CM | POA: Diagnosis not present

## 2017-09-10 LAB — CUP PACEART INCLINIC DEVICE CHECK
Battery Remaining Longevity: 81 mo
Battery Voltage: 3.01 V
Brady Statistic AP VP Percent: 0.65 %
Brady Statistic AS VP Percent: 0.07 %
Brady Statistic AS VS Percent: 15.23 %
Brady Statistic RA Percent Paced: 83.27 %
Date Time Interrogation Session: 20190325150120
Implantable Lead Implant Date: 20060223
Implantable Lead Location: 753859
Implantable Lead Location: 753860
Implantable Pulse Generator Implant Date: 20151125
Lead Channel Impedance Value: 361 Ohm
Lead Channel Impedance Value: 418 Ohm
Lead Channel Pacing Threshold Amplitude: 0.625 V
Lead Channel Pacing Threshold Amplitude: 0.625 V
Lead Channel Pacing Threshold Pulse Width: 0.4 ms
Lead Channel Pacing Threshold Pulse Width: 0.4 ms
Lead Channel Sensing Intrinsic Amplitude: 3.25 mV
Lead Channel Sensing Intrinsic Amplitude: 6.125 mV
MDC IDC LEAD IMPLANT DT: 20060223
MDC IDC MSMT LEADCHNL RA IMPEDANCE VALUE: 494 Ohm
MDC IDC MSMT LEADCHNL RV IMPEDANCE VALUE: 494 Ohm
MDC IDC MSMT LEADCHNL RV SENSING INTR AMPL: 5.875 mV
MDC IDC MSMT LEADCHNL RV SENSING INTR AMPL: 7.625 mV
MDC IDC SET LEADCHNL RA PACING AMPLITUDE: 2 V
MDC IDC SET LEADCHNL RV PACING AMPLITUDE: 2.5 V
MDC IDC SET LEADCHNL RV PACING PULSEWIDTH: 0.4 ms
MDC IDC SET LEADCHNL RV SENSING SENSITIVITY: 2.8 mV
MDC IDC STAT BRADY AP VS PERCENT: 84.05 %
MDC IDC STAT BRADY RV PERCENT PACED: 0.84 %

## 2017-09-10 MED ORDER — METOPROLOL TARTRATE 50 MG PO TABS: ORAL_TABLET | ORAL | 0 refills | Status: DC

## 2017-09-10 NOTE — Patient Instructions (Addendum)
Medication Instructions:  Your physician recommends that you continue on your current medications as directed. Please refer to the Current Medication list given to you today.  Labwork: None ordered.  Testing/Procedures: Your physician has requested that you have an echocardiogram. Echocardiography is a painless test that uses sound waves to create images of your heart. It provides your doctor with information about the size and shape of your heart and how well your heart's chambers and valves are working. This procedure takes approximately one hour. There are no restrictions for this procedure.  Your physician has requested that you have cardiac MRI.   Please follow instructions regarding your MRI.  Follow-Up: Your physician recommends that you schedule a follow-up appointment in:   One Year with Dr Caryl Comes   Any Other Special Instructions Will Be Listed Below (If Applicable).     If you need a refill on your cardiac medications before your next appointment, please call your pharmacy.  Marland Kitchen

## 2017-09-10 NOTE — Progress Notes (Signed)
Patient Care Team: Crist Infante, MD as PCP - General   HPI  Steven Macdonald is a 83 y.o. male Seen in followup for AFlutter s/p RFCA He has had no recurrent arrhythmia.  He had a previously implanted dual-chamber Medtronic pacemaker for sinus node dysfunction  He underrwent generator repalcement 11/15     DATE TEST EF   10/09 CTA  Ca score 0  12/13 Echo   25 %   1/15 Myoview  Multiple perfusion defects   1/15 Echo   45-50 %   10/16 Echo  45-50%    He has a history of pulmonary embolism and is on Coumadin.     Date Hgb  10/18 14.5       He comes in having noted a significant change in his overall status in the last  4 months.  11/18 things were great.  By 1/19 things were not.  This included lassitude, emotional physical fatigue, dyspnea on exertion and loss of sexual interest and function  He has seen [facility]-MD eval is apparently negative; he was started on an antidepressant     Past Medical History:  Diagnosis Date  . Arthritis   . Atrial flutter ([place])    RFCA   . BPH (benign prostatic hypertrophy)   . Cardiomyopathy, nonischemic -resolved    Echo 2008: EF 25% Cardiac CT 2009: Normal cors EF 31% Echo 3/10 EF 45% Echo 6/13: EF 50-55%    . CHF (congestive heart failure) ([place])   . Chicken pox   . Colon polyps    hyperplastic  . Diabetes mellitus ([place])   . ED (erectile dysfunction)   . Gilbert's syndrome    possible  . Hemorrhoids   . Herniated cervical disc   . HLD (hyperlipidemia)   . HTN (hypertension)   . LBBB (left bundle branch block)   . Orthostatic hypotension   . Pacemaker  Medtronic    DOI 2/06  . 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 Pulmonary embolism ([place])   . Sinus node dysfunction [facility])         Past Surgical History:  Procedure Laterality Date  . CARDIOVERSION    . coronary ablation    . CYSTOSCOPY W/ TRANSURETHRAL RESECTION OF POSTERIOR URETHERAL VALVES    . PACEMAKER GENERATOR CHANGE N/A 05/13/2014   Procedure: PACEMAKER GENERATOR  CHANGE;  Surgeon: Deboraha Sprang, MD;  Location: [facility] CATH LAB;  Service: Cardiovascular;  Laterality: N/A;  . PACEMAKER INSERTION      Current Outpatient Medications  Medication Sig Dispense Refill  . Calcium-Magnesium-Vitamin D (CALCIUM 500 PO) Take 1 tablet by mouth daily.     . cholecalciferol (VITAMIN D) 1000 UNITS tablet Take 1,000 Units by mouth daily.    . cyclobenzaprine (FLEXERIL) 10 MG tablet Take 1 tablet (10 mg total) by mouth 2 (two) times daily as needed for muscle spasms. 20 tablet 0  . finasteride (PROSCAR) 5 MG tablet Take 5 mg by mouth.    . fish oil-omega-3 fatty acids 1000 MG capsule Take 1 tablet by mouth daily    . lisinopril (PRINIVIL,ZESTRIL) 5 MG tablet Take 5 mg by mouth daily.     . mirtazapine (REMERON) 15 MG tablet Take 15 mg by mouth at bedtime.    . Psyllium (METAMUCIL MULTIHEALTH FIBER PO) Take 1 Applicatorful by mouth daily.     . sildenafil (VIAGRA) 100 MG tablet Take 100 mg by mouth daily as needed for erectile dysfunction.     Marland Kitchen  simvastatin (ZOCOR) 40 MG tablet Take 20 mg by mouth 2 (two) times daily.     . tamsulosin (FLOMAX) 0.4 MG CAPS capsule Take 0.4 mg by mouth.    . warfarin (COUMADIN) 7.5 MG tablet Take 5-7.5 mg by mouth daily. Except on Monday, Wednesday and Friday take 5 mg    . Zn-Pyg Afri-Nettle-Saw Palmet (SAW PALMETTO COMPLEX PO) Take 1 tablet by mouth daily.     No current facility-administered medications for this visit.     No Known Allergies  Review of Systems negative except from HPI and PMH  Physical Exam BP (!) 110/54   Pulse 81   Ht 6\' 3"  (1.905 m)   Wt 183 lb (83 kg)   SpO2 95%   BMI 22.87 kg/m  Well developed and nourished in no acute distress HENT normal Neck supple with JVP-flat Clear Regular rate and rhythm, no murmurs or gallops Abd-soft with active BS No Clubbing cyanosis edema Skin-warm and dry Affect flat A & Oriented  Grossly normal sensory and motor function  ECG sinus rhythm with left bundle branch  block unchanged from 2/17  Assessment and  Plan  Sinus node dysfunction  Dypsnea on exertion  Pacemaker-Medtronic The patient's device was interrogated.  The information was reviewed. No changes were made in the programming.    Nonischemic cardiomyopathy-resolved  LBBB   Pulmonary Embolism  Chronic therapy  depression    Given his dyspnea on exertion, we will undertake an echocardiogram to see if there is been any worsening of his LV function.  In the context of a prior nonischemic myopathy and ongoing left bundle branch block this is a concern.  Situation we will be contributing to depression.  He  is anticipating retirement after 57 years.  He has a daughter, Alexa, who is an from [address] to [place].   More than 50% of 45 min was spent in counseling related to the above

## 2017-09-12 ENCOUNTER — Other Ambulatory Visit: Payer: Self-pay

## 2017-09-12 ENCOUNTER — Ambulatory Visit (HOSPITAL_COMMUNITY): Payer: Medicare Other | Attending: Internal Medicine

## 2017-09-12 DIAGNOSIS — R0609 Other forms of dyspnea: Secondary | ICD-10-CM | POA: Diagnosis not present

## 2017-09-12 DIAGNOSIS — I082 Rheumatic disorders of both aortic and tricuspid valves: Secondary | ICD-10-CM | POA: Insufficient documentation

## 2017-09-12 DIAGNOSIS — I5030 Unspecified diastolic (congestive) heart failure: Secondary | ICD-10-CM | POA: Diagnosis not present

## 2017-09-12 DIAGNOSIS — I420 Dilated cardiomyopathy: Secondary | ICD-10-CM | POA: Insufficient documentation

## 2017-09-13 ENCOUNTER — Encounter: Payer: Medicare Other | Admitting: Internal Medicine

## 2017-09-14 DIAGNOSIS — Z1212 Encounter for screening for malignant neoplasm of rectum: Secondary | ICD-10-CM | POA: Diagnosis not present

## 2017-09-18 DIAGNOSIS — Z7901 Long term (current) use of anticoagulants: Secondary | ICD-10-CM | POA: Diagnosis not present

## 2017-09-21 DIAGNOSIS — H2513 Age-related nuclear cataract, bilateral: Secondary | ICD-10-CM | POA: Diagnosis not present

## 2017-09-24 DIAGNOSIS — F329 Major depressive disorder, single episode, unspecified: Secondary | ICD-10-CM | POA: Diagnosis not present

## 2017-09-24 DIAGNOSIS — F419 Anxiety disorder, unspecified: Secondary | ICD-10-CM | POA: Diagnosis not present

## 2017-10-04 NOTE — Addendum Note (Signed)
Addended by: Dollene Primrose on: 10/04/2017 10:25 AM   Modules accepted: Orders

## 2017-10-05 ENCOUNTER — Telehealth: Payer: Self-pay

## 2017-10-05 DIAGNOSIS — I429 Cardiomyopathy, unspecified: Secondary | ICD-10-CM

## 2017-10-05 DIAGNOSIS — I447 Left bundle-branch block, unspecified: Secondary | ICD-10-CM

## 2017-10-05 NOTE — Telephone Encounter (Signed)
Called pt to verify time and date for labs to be drawn in preporation for his CT on 5/8.

## 2017-10-09 DIAGNOSIS — F419 Anxiety disorder, unspecified: Secondary | ICD-10-CM | POA: Diagnosis not present

## 2017-10-09 DIAGNOSIS — F329 Major depressive disorder, single episode, unspecified: Secondary | ICD-10-CM | POA: Diagnosis not present

## 2017-10-11 DIAGNOSIS — Z7901 Long term (current) use of anticoagulants: Secondary | ICD-10-CM | POA: Diagnosis not present

## 2017-10-16 ENCOUNTER — Other Ambulatory Visit: Payer: Medicare Other

## 2017-10-16 DIAGNOSIS — I447 Left bundle-branch block, unspecified: Secondary | ICD-10-CM

## 2017-10-16 DIAGNOSIS — I429 Cardiomyopathy, unspecified: Secondary | ICD-10-CM | POA: Diagnosis not present

## 2017-10-16 LAB — CBC WITH DIFFERENTIAL/PLATELET
Basophils Absolute: 0 10*3/uL (ref 0.0–0.2)
Basos: 1 %
EOS (ABSOLUTE): 0.2 10*3/uL (ref 0.0–0.4)
Eos: 3 %
HEMATOCRIT: 44 % (ref 37.5–51.0)
HEMOGLOBIN: 14.6 g/dL (ref 13.0–17.7)
Immature Grans (Abs): 0 10*3/uL (ref 0.0–0.1)
Immature Granulocytes: 0 %
LYMPHS ABS: 2.9 10*3/uL (ref 0.7–3.1)
Lymphs: 45 %
MCH: 28.8 pg (ref 26.6–33.0)
MCHC: 33.2 g/dL (ref 31.5–35.7)
MCV: 87 fL (ref 79–97)
MONOCYTES: 10 %
MONOS ABS: 0.6 10*3/uL (ref 0.1–0.9)
NEUTROS ABS: 2.6 10*3/uL (ref 1.4–7.0)
Neutrophils: 41 %
Platelets: 127 10*3/uL — ABNORMAL LOW (ref 150–379)
RBC: 5.07 x10E6/uL (ref 4.14–5.80)
RDW: 14.9 % (ref 12.3–15.4)
WBC: 6.4 10*3/uL (ref 3.4–10.8)

## 2017-10-16 LAB — BASIC METABOLIC PANEL
BUN / CREAT RATIO: 17 (ref 10–24)
BUN: 19 mg/dL (ref 8–27)
CHLORIDE: 103 mmol/L (ref 96–106)
CO2: 24 mmol/L (ref 20–29)
Calcium: 9.7 mg/dL (ref 8.6–10.2)
Creatinine, Ser: 1.1 mg/dL (ref 0.76–1.27)
GFR calc non Af Amer: 63 mL/min/{1.73_m2} (ref >59)
GFR, EST AFRICAN AMERICAN: 73 mL/min/{1.73_m2} (ref >59)
GLUCOSE: 101 mg/dL — AB (ref 65–99)
Potassium: 4.7 mmol/L (ref 3.5–5.2)
SODIUM: 140 mmol/L (ref 134–144)

## 2017-10-17 ENCOUNTER — Telehealth: Payer: Self-pay

## 2017-10-17 NOTE — Telephone Encounter (Signed)
-----   Message from Deboraha Sprang, MD sent at 10/17/2017 10:20 AM EDT ----- Please Inform Patient that labs are normal x   Thanks

## 2017-10-17 NOTE — Telephone Encounter (Signed)
lmtcb for lab results

## 2017-10-24 ENCOUNTER — Ambulatory Visit (HOSPITAL_COMMUNITY)
Admission: RE | Admit: 2017-10-24 | Discharge: 2017-10-24 | Disposition: A | Discharge status: Home / Self Care | Payer: Medicare Other | Source: Ambulatory Visit | Attending: Internal Medicine | Admitting: Internal Medicine

## 2017-10-24 ENCOUNTER — Encounter (HOSPITAL_COMMUNITY): Payer: Self-pay

## 2017-10-24 DIAGNOSIS — I428 Other cardiomyopathies: Secondary | ICD-10-CM | POA: Diagnosis not present

## 2017-10-24 DIAGNOSIS — I700 Atherosclerosis of aorta: Secondary | ICD-10-CM | POA: Diagnosis not present

## 2017-10-24 DIAGNOSIS — I77810 Thoracic aortic ectasia: Secondary | ICD-10-CM | POA: Insufficient documentation

## 2017-10-24 MED ORDER — IOPAMIDOL (ISOVUE-370) INJECTION 76%
100.0000 mL | Freq: Once | INTRAVENOUS | Status: AC | PRN
  Administered 2017-10-24: 100 mL via INTRAVENOUS

## 2017-10-24 MED ORDER — NITROGLYCERIN 0.4 MG SL SUBL
0.8000 mg | SUBLINGUAL_TABLET | Freq: Once | SUBLINGUAL | Status: AC
  Administered 2017-10-24: 0.4 mg via SUBLINGUAL
  Filled 2017-10-24: qty 25

## 2017-10-24 MED ORDER — NITROGLYCERIN 0.4 MG SL SUBL
SUBLINGUAL_TABLET | SUBLINGUAL | Status: AC
  Administered 2017-10-24: 0.4 mg via SUBLINGUAL
  Filled 2017-10-24: qty 1

## 2017-11-05 ENCOUNTER — Telehealth: Payer: Self-pay | Admitting: Internal Medicine

## 2017-11-05 NOTE — Telephone Encounter (Signed)
Pt as CT done 10-24-17 and would like to get those results-pls call [phone]

## 2017-11-05 NOTE — Telephone Encounter (Signed)
-----   Message from Deboraha Sprang, MD sent at 11/05/2017  9:18 AM EDT ----- Please Inform Patient that CT shows finding consistent with myoview 2015 but distinct from 2009-- now evidence of an occluded artery which has collateral flow Also some widening of the Aorta which prompts annual reassessment  Thanks

## 2017-11-05 NOTE — Telephone Encounter (Signed)
Left message for patient to call back  

## 2017-11-06 NOTE — Telephone Encounter (Signed)
Pt informed of test results and recommendations. He verbalized understanding and had no additional questions.

## 2017-11-06 NOTE — Telephone Encounter (Signed)
LVM for return call. 

## 2017-11-07 DIAGNOSIS — F329 Major depressive disorder, single episode, unspecified: Secondary | ICD-10-CM | POA: Diagnosis not present

## 2017-11-07 DIAGNOSIS — F419 Anxiety disorder, unspecified: Secondary | ICD-10-CM | POA: Diagnosis not present

## 2017-11-13 ENCOUNTER — Ambulatory Visit (INDEPENDENT_AMBULATORY_CARE_PROVIDER_SITE_OTHER): Payer: Medicare Other | Admitting: *Deleted

## 2017-11-13 DIAGNOSIS — I495 Sick sinus syndrome: Secondary | ICD-10-CM

## 2017-11-14 NOTE — Progress Notes (Signed)
Remote pacemaker transmission.   

## 2017-11-15 DIAGNOSIS — Z7901 Long term (current) use of anticoagulants: Secondary | ICD-10-CM | POA: Diagnosis not present

## 2017-11-15 DIAGNOSIS — I480 Paroxysmal atrial fibrillation: Secondary | ICD-10-CM | POA: Diagnosis not present

## 2017-11-15 LAB — CUP PACEART REMOTE DEVICE CHECK
Battery Remaining Longevity: 77 mo
Brady Statistic AP VP Percent: 0.46 %
Brady Statistic AS VP Percent: 0.1 %
Brady Statistic AS VS Percent: 22.82 %
Brady Statistic RA Percent Paced: 76.44 %
Brady Statistic RV Percent Paced: 0.6 %
Implantable Lead Implant Date: 20060223
Implantable Lead Location: 753859
Implantable Lead Model: 5076
Implantable Lead Model: 5076
Lead Channel Impedance Value: 361 Ohm
Lead Channel Impedance Value: 437 Ohm
Lead Channel Impedance Value: 456 Ohm
Lead Channel Pacing Threshold Amplitude: 0.5 V
Lead Channel Pacing Threshold Amplitude: 0.625 V
Lead Channel Pacing Threshold Pulse Width: 0.4 ms
Lead Channel Sensing Intrinsic Amplitude: 3.375 mV
Lead Channel Sensing Intrinsic Amplitude: 3.375 mV
Lead Channel Sensing Intrinsic Amplitude: 6.375 mV
Lead Channel Setting Pacing Amplitude: 2 V
MDC IDC LEAD IMPLANT DT: 20060223
MDC IDC LEAD LOCATION: 753860
MDC IDC MSMT BATTERY VOLTAGE: 3 V
MDC IDC MSMT LEADCHNL RA PACING THRESHOLD PULSEWIDTH: 0.4 ms
MDC IDC MSMT LEADCHNL RV IMPEDANCE VALUE: 494 Ohm
MDC IDC MSMT LEADCHNL RV SENSING INTR AMPL: 6.375 mV
MDC IDC PG IMPLANT DT: 20151125
MDC IDC SESS DTM: 20190528200543
MDC IDC SET LEADCHNL RV PACING AMPLITUDE: 2.5 V
MDC IDC SET LEADCHNL RV PACING PULSEWIDTH: 0.4 ms
MDC IDC SET LEADCHNL RV SENSING SENSITIVITY: 2.8 mV
MDC IDC STAT BRADY AP VS PERCENT: 76.62 %

## 2017-11-16 ENCOUNTER — Encounter: Payer: Self-pay | Admitting: Cardiology

## 2017-12-07 DIAGNOSIS — I10 Essential (primary) hypertension: Secondary | ICD-10-CM | POA: Diagnosis not present

## 2017-12-07 DIAGNOSIS — I480 Paroxysmal atrial fibrillation: Secondary | ICD-10-CM | POA: Diagnosis not present

## 2017-12-07 DIAGNOSIS — E7849 Other hyperlipidemia: Secondary | ICD-10-CM | POA: Diagnosis not present

## 2017-12-07 DIAGNOSIS — F3289 Other specified depressive episodes: Secondary | ICD-10-CM | POA: Diagnosis not present

## 2017-12-07 DIAGNOSIS — Z950 Presence of cardiac pacemaker: Secondary | ICD-10-CM | POA: Diagnosis not present

## 2017-12-07 DIAGNOSIS — N401 Enlarged prostate with lower urinary tract symptoms: Secondary | ICD-10-CM | POA: Diagnosis not present

## 2017-12-07 DIAGNOSIS — I509 Heart failure, unspecified: Secondary | ICD-10-CM | POA: Diagnosis not present

## 2017-12-07 DIAGNOSIS — R6882 Decreased libido: Secondary | ICD-10-CM | POA: Diagnosis not present

## 2017-12-07 DIAGNOSIS — Z6823 Body mass index (BMI) 23.0-23.9, adult: Secondary | ICD-10-CM | POA: Diagnosis not present

## 2017-12-07 DIAGNOSIS — I77810 Thoracic aortic ectasia: Secondary | ICD-10-CM | POA: Diagnosis not present

## 2017-12-07 DIAGNOSIS — N138 Other obstructive and reflux uropathy: Secondary | ICD-10-CM | POA: Diagnosis not present

## 2017-12-07 DIAGNOSIS — I2510 Atherosclerotic heart disease of native coronary artery without angina pectoris: Secondary | ICD-10-CM | POA: Diagnosis not present

## 2017-12-11 DIAGNOSIS — H2513 Age-related nuclear cataract, bilateral: Secondary | ICD-10-CM | POA: Diagnosis not present

## 2017-12-17 ENCOUNTER — Encounter (INDEPENDENT_AMBULATORY_CARE_PROVIDER_SITE_OTHER): Payer: Self-pay | Admitting: Physician Assistant

## 2017-12-17 ENCOUNTER — Ambulatory Visit (INDEPENDENT_AMBULATORY_CARE_PROVIDER_SITE_OTHER): Payer: Medicare Other

## 2017-12-17 ENCOUNTER — Ambulatory Visit (INDEPENDENT_AMBULATORY_CARE_PROVIDER_SITE_OTHER): Payer: Medicare Other | Admitting: Physician Assistant

## 2017-12-17 DIAGNOSIS — M1711 Unilateral primary osteoarthritis, right knee: Secondary | ICD-10-CM

## 2017-12-17 DIAGNOSIS — M25561 Pain in right knee: Secondary | ICD-10-CM | POA: Diagnosis not present

## 2017-12-17 NOTE — Progress Notes (Signed)
Office Visit Note   Patient: Steven Macdonald           Date of Birth: Jun 28, 1934           MRN: [id number] Visit Date: 12/17/2017              Requested by: Crist Infante, MD [address] [place], [place] [address] PCP: Crist Infante, MD   Assessment & Plan: Visit Diagnoses:  1. Right knee pain, unspecified chronicity   2. Primary osteoarthritis of right knee     Plan: Gave him quad strengthening exercises including handouts whenever it with him extensively.  Discussed knee friendly exercises with him.  Offered a cortisone injection in the knee defers.  See him back on an as-needed basis pain persist or becomes worse.  Follow-Up Instructions: Return if symptoms worsen or fail to improve.   Orders:  Orders Placed This Encounter  Procedures  . XR Knee 1-2 Views Right   No orders of the defined types were placed in this encounter.     Procedures: No procedures performed   Clinical Data: No additional findings.   Subjective: Chief Complaint  Patient presents with  . Right Knee - Pain    HPI Steven Macdonald is well-known to Dr. Ninfa Linden service returns today for right knee pain that is been ongoing for the past 3 to 4 months.  He is recently seen for his back pain and went to therapy and his back pain is totally resolved.  Is now having right knee pain with no known injury.  He notes no swelling or mechanical symptoms of the knee other than some sensation in the knee giving way but no actual giving way.  Has pain worse whenever he is on the treadmill.  He also has some difficulty with steps.  He is tried no medications or treatment for this. Review of Systems Please see HPI otherwise negative  Objective: Vital Signs: There were no vitals taken for this visit.  Physical Exam General: Well-developed well-nourished male in no acute distress mood affect appropriate Psych: Alert and oriented x3 Ortho Exam Bilateral knees he has atrophy of the quads.  No significant crepitus  with passive range of motion of both knees.  Good range of motion of both knees.  No instability with valgus varus stressing of either knee no effusion abnormal warmth erythema either knee.  Right knee has a positive Osmond Lockman's negative bilaterally Adrieanna Boteler test.  Specialty Comments:  No specialty comments available.  Imaging: Xr Knee 1-2 Views Right  Result Date: 12/17/2017 Right knee AP lateral views: Patellofemoral moderate to severe medial compartmental narrowing.  Mild to moderate changes.  Mild changes lateral compartment.  Knee is well located.  No acute fractures.    PMFS History: Patient Active Problem List   Diagnosis Date Noted  . Acute left-sided low back pain without sciatica 06/04/2017  . Precordial chest pain 07/17/2013  . Pulmonary embolism ([place]) 10/17/2012  . Nonischemic cardiomyopathy ([place]) 06/04/2012  . Pacemaker-Medtronic 04/11/2011  . HYPERTENSION, BENIGN 05/24/2009  . LBBB 10/06/2008  . SICK SINUS/ TACHY-BRADY SYNDROME 10/06/2008  . HYPERLIPIDEMIA 08/27/2008  . CARDIOMYOPATHY--resolved 08/27/2008   Past Medical History:  Diagnosis Date  . Arthritis   . Atrial flutter ([place])    RFCA   . BPH (benign prostatic hypertrophy)   . Cardiomyopathy, nonischemic -resolved    Echo 2008: EF 25% Cardiac CT 2009: Normal cors EF 31% Echo 3/10 EF 45% Echo 6/13: EF 50-55%    . CHF (congestive  heart failure) ([place])   . Chicken pox   . Colon polyps    hyperplastic  . Diabetes mellitus ([place])   . ED (erectile dysfunction)   . 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 Hemorrhoids   . Herniated cervical disc   . HLD (hyperlipidemia)   . HTN (hypertension)   . LBBB (left bundle branch block)   . Orthostatic hypotension   . Pacemaker  Medtronic    DOI 2/06  . 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 Pulmonary embolism ([place])   . Sinus node dysfunction ([facility])         Family History  Problem Relation Age of Onset  . Breast cancer Mother   . Colon polyps Father        pre-cancerous    . Bladder Cancer Father     Past Surgical History:  Procedure Laterality Date  . CARDIOVERSION    . coronary ablation    . CYSTOSCOPY W/ TRANSURETHRAL RESECTION OF POSTERIOR URETHERAL VALVES    . PACEMAKER GENERATOR CHANGE N/A 05/13/2014   Procedure: PACEMAKER GENERATOR CHANGE;  Surgeon: Deboraha Sprang, MD;  Location: [facility] CATH LAB;  Service: Cardiovascular;  Laterality: N/A;  . PACEMAKER INSERTION     Social History   Occupational History  . Occupation: Insurance underwriter: SELF-EMPLOYED  Tobacco Use  . Smoking status: Former Smoker    Last attempt to quit: 10/17/1970    Years since quitting: 47.2  . Smokeless tobacco: Never Used  Substance and Sexual Activity  . Alcohol use: Yes    Comment: 2 glasses wine daily  . Drug use: No  . Sexual activity: Not on file

## 2018-01-07 DIAGNOSIS — Z7901 Long term (current) use of anticoagulants: Secondary | ICD-10-CM | POA: Diagnosis not present

## 2018-02-06 DIAGNOSIS — E7849 Other hyperlipidemia: Secondary | ICD-10-CM | POA: Diagnosis not present

## 2018-02-06 DIAGNOSIS — Z7901 Long term (current) use of anticoagulants: Secondary | ICD-10-CM | POA: Diagnosis not present

## 2018-02-06 DIAGNOSIS — E785 Hyperlipidemia, unspecified: Secondary | ICD-10-CM | POA: Diagnosis not present

## 2018-02-08 ENCOUNTER — Other Ambulatory Visit: Payer: Self-pay | Admitting: Internal Medicine

## 2018-02-12 ENCOUNTER — Ambulatory Visit (INDEPENDENT_AMBULATORY_CARE_PROVIDER_SITE_OTHER): Payer: Medicare Other | Admitting: *Deleted

## 2018-02-12 DIAGNOSIS — I495 Sick sinus syndrome: Secondary | ICD-10-CM | POA: Diagnosis not present

## 2018-02-12 NOTE — Progress Notes (Signed)
Remote pacemaker transmission.   

## 2018-02-14 ENCOUNTER — Encounter: Payer: Self-pay | Admitting: Cardiology

## 2018-02-14 ENCOUNTER — Ambulatory Visit
Admission: RE | Admit: 2018-02-14 | Discharge: 2018-02-14 | Disposition: A | Discharge status: Home / Self Care | Payer: Medicare Other | Source: Ambulatory Visit | Attending: Internal Medicine | Admitting: Internal Medicine

## 2018-03-05 DIAGNOSIS — I2510 Atherosclerotic heart disease of native coronary artery without angina pectoris: Secondary | ICD-10-CM | POA: Diagnosis not present

## 2018-03-05 DIAGNOSIS — I2699 Other pulmonary embolism without acute cor pulmonale: Secondary | ICD-10-CM | POA: Diagnosis not present

## 2018-03-05 DIAGNOSIS — Z6823 Body mass index (BMI) 23.0-23.9, adult: Secondary | ICD-10-CM | POA: Diagnosis not present

## 2018-03-05 DIAGNOSIS — I10 Essential (primary) hypertension: Secondary | ICD-10-CM | POA: Diagnosis not present

## 2018-03-05 DIAGNOSIS — Z950 Presence of cardiac pacemaker: Secondary | ICD-10-CM | POA: Diagnosis not present

## 2018-03-05 DIAGNOSIS — I509 Heart failure, unspecified: Secondary | ICD-10-CM | POA: Diagnosis not present

## 2018-03-05 DIAGNOSIS — R7301 Impaired fasting glucose: Secondary | ICD-10-CM | POA: Diagnosis not present

## 2018-03-05 DIAGNOSIS — I480 Paroxysmal atrial fibrillation: Secondary | ICD-10-CM | POA: Diagnosis not present

## 2018-03-05 LAB — CUP PACEART REMOTE DEVICE CHECK
Brady Statistic AP VS Percent: 83.37 %
Brady Statistic AS VP Percent: 0.03 %
Brady Statistic AS VS Percent: 16.1 %
Brady Statistic RV Percent Paced: 0.55 %
Implantable Lead Implant Date: 20060223
Implantable Lead Location: 753859
Implantable Lead Model: 5076
Implantable Lead Model: 5076
Lead Channel Impedance Value: 342 Ohm
Lead Channel Impedance Value: 456 Ohm
Lead Channel Impedance Value: 475 Ohm
Lead Channel Pacing Threshold Amplitude: 0.625 V
Lead Channel Pacing Threshold Amplitude: 0.75 V
Lead Channel Pacing Threshold Pulse Width: 0.4 ms
Lead Channel Sensing Intrinsic Amplitude: 2.5 mV
Lead Channel Sensing Intrinsic Amplitude: 2.5 mV
Lead Channel Sensing Intrinsic Amplitude: 4.875 mV
Lead Channel Sensing Intrinsic Amplitude: 4.875 mV
Lead Channel Setting Pacing Amplitude: 2.5 V
Lead Channel Setting Pacing Pulse Width: 0.4 ms
Lead Channel Setting Sensing Sensitivity: 2.8 mV
MDC IDC LEAD IMPLANT DT: 20060223
MDC IDC LEAD LOCATION: 753860
MDC IDC MSMT BATTERY REMAINING LONGEVITY: 76 mo
MDC IDC MSMT BATTERY VOLTAGE: 3 V
MDC IDC MSMT LEADCHNL RA PACING THRESHOLD PULSEWIDTH: 0.4 ms
MDC IDC MSMT LEADCHNL RV IMPEDANCE VALUE: 399 Ohm
MDC IDC PG IMPLANT DT: 20151125
MDC IDC SESS DTM: 20190827134133
MDC IDC SET LEADCHNL RA PACING AMPLITUDE: 2 V
MDC IDC STAT BRADY AP VP PERCENT: 0.5 %
MDC IDC STAT BRADY RA PERCENT PACED: 83.5 %

## 2018-03-29 DIAGNOSIS — I480 Paroxysmal atrial fibrillation: Secondary | ICD-10-CM | POA: Diagnosis not present

## 2018-04-05 DIAGNOSIS — Z23 Encounter for immunization: Secondary | ICD-10-CM | POA: Diagnosis not present

## 2018-04-08 DIAGNOSIS — M545 Low back pain: Secondary | ICD-10-CM | POA: Diagnosis not present

## 2018-04-08 DIAGNOSIS — M9903 Segmental and somatic dysfunction of lumbar region: Secondary | ICD-10-CM | POA: Diagnosis not present

## 2018-04-09 DIAGNOSIS — M9903 Segmental and somatic dysfunction of lumbar region: Secondary | ICD-10-CM | POA: Diagnosis not present

## 2018-04-09 DIAGNOSIS — M545 Low back pain: Secondary | ICD-10-CM | POA: Diagnosis not present

## 2018-05-07 ENCOUNTER — Ambulatory Visit (INDEPENDENT_AMBULATORY_CARE_PROVIDER_SITE_OTHER): Payer: Medicare Other

## 2018-05-07 ENCOUNTER — Encounter (INDEPENDENT_AMBULATORY_CARE_PROVIDER_SITE_OTHER): Payer: Self-pay | Admitting: Orthopaedic Surgery

## 2018-05-07 ENCOUNTER — Ambulatory Visit (INDEPENDENT_AMBULATORY_CARE_PROVIDER_SITE_OTHER): Payer: Medicare Other | Admitting: Orthopaedic Surgery

## 2018-05-07 DIAGNOSIS — M7541 Impingement syndrome of right shoulder: Secondary | ICD-10-CM | POA: Diagnosis not present

## 2018-05-07 MED ORDER — LIDOCAINE HCL 1 % IJ SOLN
3.0000 mL | INTRAMUSCULAR | Status: AC | PRN
  Administered 2018-05-07: 3 mL

## 2018-05-07 MED ORDER — METHYLPREDNISOLONE ACETATE 40 MG/ML IJ SUSP
40.0000 mg | INTRAMUSCULAR | Status: AC | PRN
  Administered 2018-05-07: 40 mg via INTRA_ARTICULAR

## 2018-05-07 NOTE — Progress Notes (Signed)
Office Visit Note   Patient: Steven Macdonald           Date of Birth: [birth year]           MRN: [id number] Visit Date: 05/07/2018              Requested by: Crist Infante, MD [address] [place], [place] [address] PCP: Crist Infante, MD   Assessment & Plan: Visit Diagnoses:  1. Shoulder impingement syndrome, right     Plan: He is given Thera-Band exercise handout sheets.  These are gone over with him at length.  He demonstrated the exercises today.  Like for him to refrain from [employer] press benchpress exercises at the gym at least for 3 weeks.  Offered cortisone injection discussed the fact that it may raise his glucose levels.  He would like to proceed with the shoulder injection.  We will see what type of response he has from the injection if he does not get good response he can call the office and we did obtain an MRI to rule out a  rotator cuff tear.  Questions encouraged and answered.  Follow-Up Instructions: Return if symptoms worsen or fail to improve.   Orders:  Orders Placed This Encounter  Procedures  . Large Joint Inj  . XR Shoulder Right   No orders of the defined types were placed in this encounter.     Procedures: Large Joint Inj: R subacromial bursa on 05/07/2018 9:37 AM Indications: pain Details: 22 G 1.5 in needle, superior approach  Arthrogram: No  Medications: 3 mL lidocaine 1 %; 40 mg methylPREDNISolone acetate 40 MG/ML Outcome: tolerated well, no immediate complications Procedure, treatment alternatives, risks and benefits explained, specific risks discussed. Consent was given by the patient. Immediately prior to procedure a time out was called to verify the correct patient, procedure, equipment, support staff and site/side marked as required. Patient was prepped and draped in the usual sterile fashion.       Clinical Data: No additional findings.   Subjective: Chief Complaint  Patient presents with  . Right Shoulder - Pain    HPI Mr.  Ducey returns today due to right shoulder pain for the past 3 months.  No known injury does go to the gym often and does upper body workouts .  He is finding these workouts are more difficult and painful.  Has decreased range of motion of the right shoulder and decreased strength.  He denies any radicular symptoms down the arm.  He states that he is prediabetic.   Review of Systems Please see HPI otherwise negative  Objective: Vital Signs: There were no vitals taken for this visit.  Physical Exam  Constitutional: He is oriented to person, place, and time. He appears well-developed and well-nourished. No distress.  Pulmonary/Chest: Effort normal.  Neurological: He is alert and oriented to person, place, and time.  Skin: He is not diaphoretic.  Psychiatric: He has a normal mood and affect.    Ortho Exam Right shoulder tenderness over the greater tuberosity region.  Bilateral biceps without gross evidence of biceps rupture.  Distal biceps intact bilaterally.  5 out of 5 strength with external and internal rotation against resistance bilateral shoulders.  Negative impingement test bilaterally.  Liftoff test is negative bilaterally.  Positive impingement testing on the right negative on the left.  He has fluid range of motion of bilateral shoulders without significant crepitus.  Slight discomfort with abduction right arm across chest.  Full forward flexion bilateral shoulders  actively. Specialty Comments:  No specialty comments available.  Imaging: Xr Shoulder Right  Result Date: 05/07/2018 Right shoulder 3 views: Glenohumeral joint well-preserved.  Mild acromioclavicular joint arthritic changes.  Subacromial space is well-maintained on the Y view.  Humerus is well located.  No acute fractures noted.  Right lung field appears clear .    PMFS History: Patient Active Problem List   Diagnosis Date Noted  . Acute left-sided low back pain without sciatica 06/04/2017  . Precordial chest pain  07/17/2013  . Pulmonary embolism ([place]) 10/17/2012  . Nonischemic cardiomyopathy ([place]) 06/04/2012  . Pacemaker-Medtronic 04/11/2011  . HYPERTENSION, BENIGN 05/24/2009  . LBBB 10/06/2008  . SICK SINUS/ TACHY-BRADY SYNDROME 10/06/2008  . HYPERLIPIDEMIA 08/27/2008  . CARDIOMYOPATHY--resolved 08/27/2008   Past Medical History:  Diagnosis Date  . Arthritis   . Atrial flutter ([place])    RFCA   . BPH (benign prostatic hypertrophy)   . Cardiomyopathy, nonischemic -resolved    Echo 2008: EF 25% Cardiac CT 2009: Normal cors EF 31% Echo 3/10 EF 45% Echo 6/13: EF 50-55%    . CHF (congestive heart failure) ([place])   . Chicken pox   . Colon polyps    hyperplastic  . Diabetes mellitus ([place])   . ED (erectile dysfunction)   . 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 Hemorrhoids   . Herniated cervical disc   . HLD (hyperlipidemia)   . HTN (hypertension)   . LBBB (left bundle branch block)   . Orthostatic hypotension   . Pacemaker  Medtronic    DOI 2/06  . 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 Pulmonary embolism ([place])   . Sinus node dysfunction ([facility])         Family History  Problem Relation Age of Onset  . Breast cancer Mother   . Colon polyps Father        pre-cancerous  . Bladder Cancer Father     Past Surgical History:  Procedure Laterality Date  . CARDIOVERSION    . coronary ablation    . CYSTOSCOPY W/ TRANSURETHRAL RESECTION OF POSTERIOR URETHERAL VALVES    . PACEMAKER GENERATOR CHANGE N/A 05/13/2014   Procedure: PACEMAKER GENERATOR CHANGE;  Surgeon: Deboraha Sprang, MD;  Location: [facility] CATH LAB;  Service: Cardiovascular;  Laterality: N/A;  . PACEMAKER INSERTION     Social History   Occupational History  . Occupation: Insurance underwriter: SELF-EMPLOYED  Tobacco Use  . Smoking status: Former Smoker    Last attempt to quit: 10/17/1970    Years since quitting: 47.5  . Smokeless tobacco: Never Used  Substance and Sexual Activity  . Alcohol use: Yes    Comment: 2 glasses wine daily  . Drug  use: No  . Sexual activity: Not on file

## 2018-05-14 ENCOUNTER — Ambulatory Visit (INDEPENDENT_AMBULATORY_CARE_PROVIDER_SITE_OTHER): Payer: Medicare Other

## 2018-05-14 DIAGNOSIS — I472 Ventricular tachycardia, unspecified: Secondary | ICD-10-CM

## 2018-05-14 DIAGNOSIS — I428 Other cardiomyopathies: Secondary | ICD-10-CM | POA: Diagnosis not present

## 2018-05-14 DIAGNOSIS — Z7901 Long term (current) use of anticoagulants: Secondary | ICD-10-CM | POA: Diagnosis not present

## 2018-05-14 DIAGNOSIS — I480 Paroxysmal atrial fibrillation: Secondary | ICD-10-CM | POA: Diagnosis not present

## 2018-05-14 NOTE — Progress Notes (Signed)
Remote pacemaker transmission.   

## 2018-05-30 DIAGNOSIS — K5909 Other constipation: Secondary | ICD-10-CM | POA: Diagnosis not present

## 2018-05-30 DIAGNOSIS — Z6824 Body mass index (BMI) 24.0-24.9, adult: Secondary | ICD-10-CM | POA: Diagnosis not present

## 2018-05-30 DIAGNOSIS — R140 Abdominal distension (gaseous): Secondary | ICD-10-CM | POA: Diagnosis not present

## 2018-06-03 DIAGNOSIS — N401 Enlarged prostate with lower urinary tract symptoms: Secondary | ICD-10-CM | POA: Diagnosis not present

## 2018-06-03 DIAGNOSIS — N138 Other obstructive and reflux uropathy: Secondary | ICD-10-CM | POA: Diagnosis not present

## 2018-06-13 DIAGNOSIS — I2699 Other pulmonary embolism without acute cor pulmonale: Secondary | ICD-10-CM | POA: Diagnosis not present

## 2018-06-13 DIAGNOSIS — M25512 Pain in left shoulder: Secondary | ICD-10-CM | POA: Diagnosis not present

## 2018-06-13 DIAGNOSIS — B029 Zoster without complications: Secondary | ICD-10-CM | POA: Diagnosis not present

## 2018-06-13 DIAGNOSIS — Z6824 Body mass index (BMI) 24.0-24.9, adult: Secondary | ICD-10-CM | POA: Diagnosis not present

## 2018-06-13 DIAGNOSIS — Z7901 Long term (current) use of anticoagulants: Secondary | ICD-10-CM | POA: Diagnosis not present

## 2018-07-02 DIAGNOSIS — R140 Abdominal distension (gaseous): Secondary | ICD-10-CM | POA: Diagnosis not present

## 2018-07-02 DIAGNOSIS — I10 Essential (primary) hypertension: Secondary | ICD-10-CM | POA: Diagnosis not present

## 2018-07-02 DIAGNOSIS — Z6823 Body mass index (BMI) 23.0-23.9, adult: Secondary | ICD-10-CM | POA: Diagnosis not present

## 2018-07-02 DIAGNOSIS — K5900 Constipation, unspecified: Secondary | ICD-10-CM | POA: Diagnosis not present

## 2018-07-04 ENCOUNTER — Ambulatory Visit (INDEPENDENT_AMBULATORY_CARE_PROVIDER_SITE_OTHER): Payer: Medicare Other | Admitting: Internal Medicine

## 2018-07-04 ENCOUNTER — Encounter: Payer: Self-pay | Admitting: Internal Medicine

## 2018-07-04 VITALS — BP 126/80 | HR 76 | Ht 75.0 in | Wt 185.0 lb

## 2018-07-04 DIAGNOSIS — K5900 Constipation, unspecified: Secondary | ICD-10-CM

## 2018-07-04 NOTE — Progress Notes (Signed)
HISTORY OF PRESENT ILLNESS:  Steven Macdonald is a 84 y.o. male, recently retired Forensic psychologist with multiple medical problems as listed below, who is sent today by his primary care provider Steven Macdonald regarding change in bowel habits.  The patient was last seen in this office November 2016 desiring colonoscopy because of a history of colon polyps.  See that dictation.  Colonoscopy revealed 3 diminutive adenomas which were removed.  No other abnormalities.  Small internal hemorrhoids present.  Patient tells me that for about 6 or 8 months he has issues with straining to defecate.  Post defecation residual fecal matter in the perianal area.  Over the past 2 and half months he describes constipation.  Prior to this, he reports about 3 bowel movements per day.  Over the past 2-1/2 months he has had 4 episodes where he is gone 2 days without a bowel movement.  He has been on Colace and 1 fiber tablet daily.  He will use MiraLAX on these occasions when he does not have a bowel movement.  His only new medication over the past year is Remeron that he is used for insomnia and depression.  No change in diet or lifestyle.  He denies abdominal pain, weight loss, or rectal bleeding review of outside x-rays includes abdominal ultrasound from August 2019.  Normal right upper quadrant.  Review of outside blood work from April 2019 finds normal hemoglobin of 14.6.  Platelets 127,000.  Normal calcium.  REVIEW OF SYSTEMS:  All non-GI ROS negative unless otherwise stated in the HPI except for sleeping problems  Past Medical History:  Diagnosis Date  . Arthritis   . Atrial flutter ([place])    RFCA   . BPH (benign prostatic hypertrophy)   . Cardiomyopathy, nonischemic -resolved    Echo 2008: EF 25% Cardiac CT 2009: Normal cors EF 31% Echo 3/10 EF 45% Echo 6/13: EF 50-55%    . CHF (congestive heart failure) ([place])   . Chicken pox   . Colon polyps    hyperplastic  . Diabetes mellitus ([place])   . ED (erectile dysfunction)   .  Gilbert's syndrome    possible  . Hemorrhoids   . Herniated cervical disc   . HLD (hyperlipidemia)   . HTN (hypertension)   . LBBB (left bundle branch block)   . Orthostatic hypotension   . Pacemaker  Medtronic    DOI 2/06  . 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 Pulmonary embolism ([place])   . Sinus node dysfunction [facility])         Past Surgical History:  Procedure Laterality Date  . CARDIOVERSION    . coronary ablation    . CYSTOSCOPY W/ TRANSURETHRAL RESECTION OF POSTERIOR URETHERAL VALVES    . PACEMAKER GENERATOR CHANGE N/A 05/13/2014   Procedure: PACEMAKER GENERATOR CHANGE;  Surgeon: Deboraha Sprang, MD;  Location: [facility] CATH LAB;  Service: Cardiovascular;  Laterality: N/A;  . PACEMAKER INSERTION      Social History Steven Macdonald  reports that he quit smoking about 47 years ago. He has never used smokeless tobacco. He reports current alcohol use. He reports that he does not use drugs.  family history includes Bladder Cancer in his father; Breast cancer in his mother; Colon polyps in his father.  No Known Allergies     PHYSICAL EXAMINATION: Vital signs: BP 126/80   Pulse 76   Ht 6\' 3"  (1.905 m)   Wt 185 lb (83.9 kg)   SpO2 96%   BMI 23.12  kg/m   Constitutional: generally well-appearing, no acute distress Psychiatric: alert and oriented x3, cooperative Eyes: extraocular movements intact, anicteric, conjunctiva pink Mouth: oral pharynx moist, no lesions Neck: supple no lymphadenopathy Cardiovascular: heart regular rate and rhythm, no murmur Lungs: clear to auscultation bilaterally Abdomen: soft, nontender, nondistended, no obvious ascites, no peritoneal signs, normal bowel sounds, no organomegaly Rectal: No external abnormalities.  Normal sensation.  Normal tone.  Normal squeeze.  No mass or tenderness.  Hemoccult negative stool Extremities: no clubbing, cyanosis, or lower extremity edema bilaterally Skin: no lesions on visible extremities Neuro: No focal deficits.  Cranial  nerves intact  ASSESSMENT:  1.  Change in bowel habits as manifested by constipation.  Functional 2.  Fecal smearing post defecation 3.  History of adenomatous colon polyps.  Last colonoscopy 3 years ago 4.  Multiple general medical problems   PLAN:  1.  Advised to change from 1 fiber tablet daily to Metamucil 2 tablespoons daily in 14 to 16 ounces of water or juice 2.  Advised to introduce MiraLAX on a more regular basis if needed.  We discussed titration strategies 3.  Aged out of routine colon cancer surveillance 4.  Contact the office for questions or problems.  Otherwise follow-up as needed.  Return to the care of Steven Macdonald.

## 2018-07-04 NOTE — Patient Instructions (Signed)
Begin taking Metamucil daily - 2 tablespoons in approximately 14-16 ounces of water or juice.  You may use Miralax regularly - adjust as needed  Please follow up as needed

## 2018-07-05 LAB — CUP PACEART REMOTE DEVICE CHECK
Brady Statistic AP VP Percent: 0.43 %
Brady Statistic AP VS Percent: 77.7 %
Brady Statistic AS VP Percent: 0.03 %
Brady Statistic RV Percent Paced: 0.48 %
Implantable Lead Implant Date: 20060223
Implantable Lead Implant Date: 20060223
Implantable Lead Model: 5076
Lead Channel Impedance Value: 418 Ohm
Lead Channel Impedance Value: 494 Ohm
Lead Channel Sensing Intrinsic Amplitude: 2.75 mV
Lead Channel Sensing Intrinsic Amplitude: 6.25 mV
Lead Channel Sensing Intrinsic Amplitude: 6.25 mV
Lead Channel Setting Pacing Amplitude: 2 V
Lead Channel Setting Pacing Amplitude: 2.5 V
Lead Channel Setting Pacing Pulse Width: 0.4 ms
Lead Channel Setting Sensing Sensitivity: 2.8 mV
MDC IDC LEAD LOCATION: 753859
MDC IDC LEAD LOCATION: 753860
MDC IDC MSMT BATTERY REMAINING LONGEVITY: 73 mo
MDC IDC MSMT BATTERY VOLTAGE: 3 V
MDC IDC MSMT LEADCHNL RA IMPEDANCE VALUE: 361 Ohm
MDC IDC MSMT LEADCHNL RA IMPEDANCE VALUE: 475 Ohm
MDC IDC MSMT LEADCHNL RA PACING THRESHOLD AMPLITUDE: 0.5 V
MDC IDC MSMT LEADCHNL RA PACING THRESHOLD PULSEWIDTH: 0.4 ms
MDC IDC MSMT LEADCHNL RA SENSING INTR AMPL: 2.75 mV
MDC IDC MSMT LEADCHNL RV PACING THRESHOLD AMPLITUDE: 0.75 V
MDC IDC MSMT LEADCHNL RV PACING THRESHOLD PULSEWIDTH: 0.4 ms
MDC IDC PG IMPLANT DT: 20151125
MDC IDC SESS DTM: 20191126165027
MDC IDC STAT BRADY AS VS PERCENT: 21.84 %
MDC IDC STAT BRADY RA PERCENT PACED: 77.58 %

## 2018-07-24 DIAGNOSIS — H25043 Posterior subcapsular polar age-related cataract, bilateral: Secondary | ICD-10-CM | POA: Diagnosis not present

## 2018-07-24 DIAGNOSIS — H2511 Age-related nuclear cataract, right eye: Secondary | ICD-10-CM | POA: Diagnosis not present

## 2018-07-24 DIAGNOSIS — H18413 Arcus senilis, bilateral: Secondary | ICD-10-CM | POA: Diagnosis not present

## 2018-07-24 DIAGNOSIS — H2513 Age-related nuclear cataract, bilateral: Secondary | ICD-10-CM | POA: Diagnosis not present

## 2018-07-24 DIAGNOSIS — H25013 Cortical age-related cataract, bilateral: Secondary | ICD-10-CM | POA: Diagnosis not present

## 2018-07-30 DIAGNOSIS — H25812 Combined forms of age-related cataract, left eye: Secondary | ICD-10-CM | POA: Diagnosis not present

## 2018-07-30 DIAGNOSIS — H2512 Age-related nuclear cataract, left eye: Secondary | ICD-10-CM | POA: Diagnosis not present

## 2018-07-31 DIAGNOSIS — H25011 Cortical age-related cataract, right eye: Secondary | ICD-10-CM | POA: Diagnosis not present

## 2018-07-31 DIAGNOSIS — H2511 Age-related nuclear cataract, right eye: Secondary | ICD-10-CM | POA: Diagnosis not present

## 2018-07-31 DIAGNOSIS — H25041 Posterior subcapsular polar age-related cataract, right eye: Secondary | ICD-10-CM | POA: Diagnosis not present

## 2018-08-06 DIAGNOSIS — H2512 Age-related nuclear cataract, left eye: Secondary | ICD-10-CM | POA: Diagnosis not present

## 2018-08-08 ENCOUNTER — Encounter: Payer: Self-pay | Admitting: Internal Medicine

## 2018-08-13 ENCOUNTER — Ambulatory Visit (INDEPENDENT_AMBULATORY_CARE_PROVIDER_SITE_OTHER): Payer: Medicare Other | Admitting: *Deleted

## 2018-08-13 DIAGNOSIS — I495 Sick sinus syndrome: Secondary | ICD-10-CM

## 2018-08-13 DIAGNOSIS — R55 Syncope and collapse: Secondary | ICD-10-CM

## 2018-08-14 LAB — CUP PACEART REMOTE DEVICE CHECK
Battery Remaining Longevity: 70 mo
Battery Voltage: 3 V
Brady Statistic AP VP Percent: 0.5 %
Brady Statistic RA Percent Paced: 86.64 %
Date Time Interrogation Session: 20200225154831
Implantable Lead Implant Date: 20060223
Implantable Lead Location: 753860
Implantable Lead Model: 5076
Implantable Lead Model: 5076
Implantable Pulse Generator Implant Date: 20151125
Lead Channel Impedance Value: 456 Ohm
Lead Channel Pacing Threshold Amplitude: 0.5 V
Lead Channel Pacing Threshold Pulse Width: 0.4 ms
Lead Channel Pacing Threshold Pulse Width: 0.4 ms
Lead Channel Sensing Intrinsic Amplitude: 2.75 mV
Lead Channel Sensing Intrinsic Amplitude: 2.75 mV
Lead Channel Setting Pacing Amplitude: 2 V
MDC IDC LEAD IMPLANT DT: 20060223
MDC IDC LEAD LOCATION: 753859
MDC IDC MSMT LEADCHNL RA IMPEDANCE VALUE: 361 Ohm
MDC IDC MSMT LEADCHNL RA IMPEDANCE VALUE: 475 Ohm
MDC IDC MSMT LEADCHNL RV IMPEDANCE VALUE: 513 Ohm
MDC IDC MSMT LEADCHNL RV PACING THRESHOLD AMPLITUDE: 0.625 V
MDC IDC MSMT LEADCHNL RV SENSING INTR AMPL: 5.625 mV
MDC IDC MSMT LEADCHNL RV SENSING INTR AMPL: 5.625 mV
MDC IDC SET LEADCHNL RV PACING AMPLITUDE: 2.5 V
MDC IDC SET LEADCHNL RV PACING PULSEWIDTH: 0.4 ms
MDC IDC SET LEADCHNL RV SENSING SENSITIVITY: 2.8 mV
MDC IDC STAT BRADY AP VS PERCENT: 86.69 %
MDC IDC STAT BRADY AS VP PERCENT: 0.01 %
MDC IDC STAT BRADY AS VS PERCENT: 12.8 %
MDC IDC STAT BRADY RV PERCENT PACED: 0.55 %

## 2018-08-20 DIAGNOSIS — H25811 Combined forms of age-related cataract, right eye: Secondary | ICD-10-CM | POA: Diagnosis not present

## 2018-08-20 DIAGNOSIS — H2511 Age-related nuclear cataract, right eye: Secondary | ICD-10-CM | POA: Diagnosis not present

## 2018-08-20 NOTE — Progress Notes (Signed)
Remote pacemaker transmission.   

## 2018-09-24 ENCOUNTER — Telehealth: Payer: Self-pay

## 2018-09-24 ENCOUNTER — Encounter: Payer: Medicare Other | Admitting: Internal Medicine

## 2018-09-24 NOTE — Telephone Encounter (Signed)
I called patients wife and left a message about switching office visit to virtual visit on 10/01/18.

## 2018-09-25 NOTE — Telephone Encounter (Signed)
Pt agrees to virtual visit. Verbal consent obtained. Will send MyChart consent when/if MyChart is set up.

## 2018-09-30 ENCOUNTER — Other Ambulatory Visit: Payer: Self-pay

## 2018-09-30 ENCOUNTER — Telehealth (INDEPENDENT_AMBULATORY_CARE_PROVIDER_SITE_OTHER): Payer: Medicare Other | Admitting: Internal Medicine

## 2018-09-30 VITALS — BP 129/62 | HR 64 | Ht 75.0 in | Wt 175.0 lb

## 2018-09-30 DIAGNOSIS — I447 Left bundle-branch block, unspecified: Secondary | ICD-10-CM | POA: Diagnosis not present

## 2018-09-30 DIAGNOSIS — I495 Sick sinus syndrome: Secondary | ICD-10-CM | POA: Diagnosis not present

## 2018-09-30 DIAGNOSIS — I472 Ventricular tachycardia, unspecified: Secondary | ICD-10-CM

## 2018-09-30 DIAGNOSIS — I428 Other cardiomyopathies: Secondary | ICD-10-CM

## 2018-09-30 DIAGNOSIS — Z950 Presence of cardiac pacemaker: Secondary | ICD-10-CM

## 2018-09-30 NOTE — Progress Notes (Signed)
Electrophysiology TeleHealth Note   Due to national recommendations of social distancing due to COVID 19, an audio/video telehealth visit is felt to be most appropriate for this patient at this time.  See MyChart message from today for the patient's consent to telehealth for [facility].   Date:  09/30/2018   ID:  Steven Macdonald, DOB [birth year], MRN [id number]  Location: patient's home  Provider location: [address] [place]  Evaluation Performed: Follow-up visit  PCP:  Crist Infante, MD  Cardiologist:  No primary care provider on file.   Electrophysiologist: SK  Chief Complaint:  Followup cAD and pacemaker   History of Present Illness:    Steven Macdonald is a 84 y.o. male who presents via audio/video conferencing for a telehealth visit today. technical difficulties--preclude video  Since last being seen in our clinic, the patient reports doing quite well  Seen in followup for AFlutter s/p RFCA He has had no recurrent arrhythmia.  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       DATE TEST EF   10/09 CTA  Ca score 0  12/13 Echo   25 %   1/15 Myoview  Multiple perfusion defects   1/15 Echo   45-50 %   10/16 Echo  45-50%    He has a history of pulmonary embolism and is on Coumadin.     Date Cr K Hgb  10/18    14.5   4/19 1.1 4.7 14.6      The patient denies symptoms of fevers, chills, cough, or new SOB worrisome for COVID 19.    Past Medical History:  Diagnosis Date  . Arthritis   .  Atrial flutter ([place])    RFCA   . BPH (benign prostatic hypertrophy)   . Cardiomyopathy, nonischemic -resolved    Echo 2008: EF 25% Cardiac CT 2009: Normal cors EF 31% Echo 3/10 EF 45% Echo 6/13: EF 50-55%    . CHF (congestive heart failure) ([place])   . Chicken pox   . Colon polyps    hyperplastic  . Diabetes mellitus ([place])   . ED (erectile dysfunction)   . Gilbert's syndrome    possible  . Hemorrhoids   . Herniated cervical disc   . HLD (hyperlipidemia)   . HTN (hypertension)   . LBBB (left bundle branch block)   . Orthostatic hypotension   . Pacemaker  Medtronic    DOI 2/06  . 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 Pulmonary embolism ([place])   . Sinus node dysfunction [facility])         Past Surgical History:  Procedure Laterality Date  . CARDIOVERSION    . coronary ablation    . CYSTOSCOPY W/ TRANSURETHRAL RESECTION OF POSTERIOR URETHERAL VALVES    . PACEMAKER GENERATOR CHANGE N/A 05/13/2014   Procedure: PACEMAKER GENERATOR CHANGE;  Surgeon: Deboraha Sprang, MD;  Location: [facility] LAB;  Service: Cardiovascular;  Laterality: N/A;  . PACEMAKER INSERTION      Current Outpatient Medications  Medication Sig Dispense Refill  .  Calcium-Magnesium-Vitamin D (CALCIUM 500 PO) Take 1 tablet by mouth daily.     . cholecalciferol (VITAMIN D) 1000 UNITS tablet Take 1,000 Units by mouth daily.    . finasteride (PROSCAR) 5 MG tablet Take 5 mg by mouth.    . fish oil-omega-3 fatty acids 1000 MG capsule Take 1 tablet by mouth daily    . lisinopril (PRINIVIL,ZESTRIL) 5 MG tablet Take 5 mg by mouth daily.     . mirtazapine (REMERON) 15 MG tablet Take 15 mg by mouth at bedtime.    . Probiotic Product (ALIGN PO) Take by mouth.    . Psyllium (METAMUCIL MULTIHEALTH FIBER PO) Take 1 Applicatorful by mouth daily.     . sildenafil (VIAGRA) 100 MG tablet Take 100 mg by mouth daily as needed for erectile dysfunction.     . simvastatin (ZOCOR) 40 MG tablet Take 20 mg by mouth 2 (two) times daily.     .  tamsulosin (FLOMAX) 0.4 MG CAPS capsule Take 0.4 mg by mouth.    . warfarin (COUMADIN) 7.5 MG tablet Take 5-7.5 mg by mouth daily. Except on Monday, Wednesday and Friday take 5 mg     No current facility-administered medications for this visit.     Allergies:   Patient has no known allergies.   Social History:  The patient  reports that he quit smoking about 47 years ago. He has never used smokeless tobacco. He reports current alcohol use. He reports that he does not use drugs.   Family History:  The patient's   family history includes Bladder Cancer in his father; Breast cancer in his mother; Colon polyps in his father.   ROS:  Please see the history of present illness.   All other systems are personally reviewed and negative.    Exam:    Vital Signs:  BP 129/62   Pulse 64   Ht 6\' 3"  (1.905 m)   Wt 175 lb (79.4 kg)   BMI 21.87 kg/m     Well appearing, alert and conversant, regular work of breathing,  good skin color Eyes- anicteric, neuro- grossly intact, skin- no apparent rash or lesions or cyanosis, mouth- oral mucosa is pink   Labs/Other Tests and Data Reviewed:    Recent Labs: 10/16/2017: BUN 19; Creatinine, Ser 1.10; Hemoglobin 14.6; Platelets 127; Potassium 4.7; Sodium 140   Wt Readings from Last 3 Encounters:  09/30/18 175 lb (79.4 kg)  07/04/18 185 lb (83.9 kg)  09/10/17 183 lb (83 kg)     Other studies personally reviewed:  *     Last device remote is reviewed from [facility] PDF dated 2/20  which reveals normal device function, SVT wth 2 different RP intervals, one long and one short  A pacing 86 %   But HR > 100 appears less than 1 %   ASSESSMENT & PLAN:     Sinus node dysfunction  Dypsnea on exertion  Pacemaker-Medtronic The patient's device was interrogated.  The information was reviewed. No changes were made in the programming.    Nonischemic cardiomyopathy-resolved  LBBB   SVT   Pulmonary Embolism  Chronic therapy w coumadin    depression  SVT asymptomatic  Euvolemic continue current meds  Depression less pervasive       COVID 19 screen The patient denies symptoms of COVID 19 at this time.  The importance of social distancing was discussed today.  Follow-up:  39 m  Next remo: As Scheduled   Current medicines are reviewed at length  with the patient today.      Labs/ tests ordered today include:  No orders of the defined types were placed in this encounter.      Patient Risk:  after full review of this patients clinical status, I feel that they are at moderate risk at this time.  Today, I have spent 16 minutes with the patient with telehealth technology discussing the above.  Signed, Virl Axe, MD  09/30/2018 11:30 AM     [facility] [address]  [place] [address] [phone] (office) [phone] (fax)

## 2018-10-01 ENCOUNTER — Encounter: Payer: Medicare Other | Admitting: Internal Medicine

## 2018-10-01 DIAGNOSIS — I2699 Other pulmonary embolism without acute cor pulmonale: Secondary | ICD-10-CM | POA: Diagnosis not present

## 2018-10-01 DIAGNOSIS — I10 Essential (primary) hypertension: Secondary | ICD-10-CM | POA: Diagnosis not present

## 2018-10-01 DIAGNOSIS — E7849 Other hyperlipidemia: Secondary | ICD-10-CM | POA: Diagnosis not present

## 2018-10-01 DIAGNOSIS — R82998 Other abnormal findings in urine: Secondary | ICD-10-CM | POA: Diagnosis not present

## 2018-10-01 DIAGNOSIS — Z125 Encounter for screening for malignant neoplasm of prostate: Secondary | ICD-10-CM | POA: Diagnosis not present

## 2018-10-01 DIAGNOSIS — R7301 Impaired fasting glucose: Secondary | ICD-10-CM | POA: Diagnosis not present

## 2018-10-01 DIAGNOSIS — Z7901 Long term (current) use of anticoagulants: Secondary | ICD-10-CM | POA: Diagnosis not present

## 2018-10-08 DIAGNOSIS — Z950 Presence of cardiac pacemaker: Secondary | ICD-10-CM | POA: Diagnosis not present

## 2018-10-08 DIAGNOSIS — I77810 Thoracic aortic ectasia: Secondary | ICD-10-CM | POA: Diagnosis not present

## 2018-10-08 DIAGNOSIS — B029 Zoster without complications: Secondary | ICD-10-CM | POA: Diagnosis not present

## 2018-10-08 DIAGNOSIS — I2699 Other pulmonary embolism without acute cor pulmonale: Secondary | ICD-10-CM | POA: Diagnosis not present

## 2018-10-08 DIAGNOSIS — I2510 Atherosclerotic heart disease of native coronary artery without angina pectoris: Secondary | ICD-10-CM | POA: Diagnosis not present

## 2018-10-08 DIAGNOSIS — K5900 Constipation, unspecified: Secondary | ICD-10-CM | POA: Diagnosis not present

## 2018-10-08 DIAGNOSIS — Z0000 Encounter for general adult medical examination without abnormal findings: Secondary | ICD-10-CM | POA: Diagnosis not present

## 2018-10-08 DIAGNOSIS — I480 Paroxysmal atrial fibrillation: Secondary | ICD-10-CM | POA: Diagnosis not present

## 2018-10-08 DIAGNOSIS — Z1331 Encounter for screening for depression: Secondary | ICD-10-CM | POA: Diagnosis not present

## 2018-10-08 DIAGNOSIS — I509 Heart failure, unspecified: Secondary | ICD-10-CM | POA: Diagnosis not present

## 2018-10-08 DIAGNOSIS — I8290 Acute embolism and thrombosis of unspecified vein: Secondary | ICD-10-CM | POA: Diagnosis not present

## 2018-10-09 ENCOUNTER — Other Ambulatory Visit: Payer: Self-pay | Admitting: Internal Medicine

## 2018-10-09 DIAGNOSIS — Z85828 Personal history of other malignant neoplasm of skin: Secondary | ICD-10-CM | POA: Diagnosis not present

## 2018-10-09 DIAGNOSIS — D1801 Hemangioma of skin and subcutaneous tissue: Secondary | ICD-10-CM | POA: Diagnosis not present

## 2018-10-09 DIAGNOSIS — L821 Other seborrheic keratosis: Secondary | ICD-10-CM | POA: Diagnosis not present

## 2018-10-09 DIAGNOSIS — L820 Inflamed seborrheic keratosis: Secondary | ICD-10-CM | POA: Diagnosis not present

## 2018-10-14 ENCOUNTER — Other Ambulatory Visit: Payer: Self-pay | Admitting: Internal Medicine

## 2018-10-14 DIAGNOSIS — I77810 Thoracic aortic ectasia: Secondary | ICD-10-CM

## 2018-11-13 ENCOUNTER — Ambulatory Visit (INDEPENDENT_AMBULATORY_CARE_PROVIDER_SITE_OTHER): Payer: Medicare Other | Admitting: *Deleted

## 2018-11-13 DIAGNOSIS — I495 Sick sinus syndrome: Secondary | ICD-10-CM

## 2018-11-14 ENCOUNTER — Telehealth: Payer: Self-pay | Admitting: Student

## 2018-11-14 LAB — CUP PACEART REMOTE DEVICE CHECK
Battery Remaining Longevity: 60 mo
Battery Voltage: 3 V
Brady Statistic AP VP Percent: 0.5 %
Brady Statistic AP VS Percent: 89.1 %
Brady Statistic AS VP Percent: 0.1 %
Brady Statistic AS VS Percent: 10.4 %
Brady Statistic RA Percent Paced: 89.6 %
Brady Statistic RV Percent Paced: 0.6 %
Date Time Interrogation Session: 20200528105430
Implantable Lead Implant Date: 20060223
Implantable Lead Implant Date: 20060223
Implantable Lead Location: 753859
Implantable Lead Location: 753860
Implantable Lead Model: 5076
Implantable Lead Model: 5076
Implantable Pulse Generator Implant Date: 20151125
Lead Channel Impedance Value: 475 Ohm
Lead Channel Impedance Value: 494 Ohm
Lead Channel Pacing Threshold Amplitude: 0.5 V
Lead Channel Pacing Threshold Amplitude: 0.75 V
Lead Channel Pacing Threshold Pulse Width: 0.4 ms
Lead Channel Pacing Threshold Pulse Width: 0.4 ms
Lead Channel Sensing Intrinsic Amplitude: 2.5 mV
Lead Channel Sensing Intrinsic Amplitude: 5.9 mV
Lead Channel Setting Pacing Amplitude: 2 V
Lead Channel Setting Pacing Amplitude: 2.5 V
Lead Channel Setting Pacing Pulse Width: 0.4 ms
Lead Channel Setting Sensing Sensitivity: 2.8 mV

## 2018-11-14 NOTE — Telephone Encounter (Signed)
Called to discuss SVT on Remote check with patient. Known history. If asymptomatic, nothing further per Dr. Caryl Comes last note.     LMOM for CB.

## 2018-11-14 NOTE — Telephone Encounter (Signed)
Asymptomatic. No symptoms of SOB, near syncope, syncope, or palpitations. + General fatigue which is chronic.   Pt knows to call with any change in symptoms.

## 2018-11-14 NOTE — Telephone Encounter (Signed)
Pt returning [place] phone call. I told the pt I will have Jonni Sanger to give him a call back.

## 2018-11-22 ENCOUNTER — Encounter: Payer: Self-pay | Admitting: Cardiology

## 2018-11-22 NOTE — Progress Notes (Signed)
Remote pacemaker transmission.   

## 2018-11-25 ENCOUNTER — Other Ambulatory Visit: Payer: Self-pay

## 2018-11-25 ENCOUNTER — Ambulatory Visit
Admission: RE | Admit: 2018-11-25 | Discharge: 2018-11-25 | Disposition: A | Discharge status: Home / Self Care | Payer: Medicare Other | Source: Ambulatory Visit | Attending: Internal Medicine | Admitting: Internal Medicine

## 2018-11-25 DIAGNOSIS — I712 Thoracic aortic aneurysm, without rupture: Secondary | ICD-10-CM | POA: Diagnosis not present

## 2018-11-25 DIAGNOSIS — I77810 Thoracic aortic ectasia: Secondary | ICD-10-CM

## 2018-11-25 MED ORDER — IOPAMIDOL (ISOVUE-370) INJECTION 76%
75.0000 mL | Freq: Once | INTRAVENOUS | Status: AC | PRN
  Administered 2018-11-25: 75 mL via INTRAVENOUS

## 2018-11-29 DIAGNOSIS — Z7901 Long term (current) use of anticoagulants: Secondary | ICD-10-CM | POA: Diagnosis not present

## 2018-12-06 DIAGNOSIS — N401 Enlarged prostate with lower urinary tract symptoms: Secondary | ICD-10-CM | POA: Diagnosis not present

## 2018-12-06 DIAGNOSIS — N138 Other obstructive and reflux uropathy: Secondary | ICD-10-CM | POA: Diagnosis not present

## 2018-12-16 DIAGNOSIS — I8290 Acute embolism and thrombosis of unspecified vein: Secondary | ICD-10-CM | POA: Diagnosis not present

## 2018-12-23 DIAGNOSIS — Z7901 Long term (current) use of anticoagulants: Secondary | ICD-10-CM | POA: Diagnosis not present

## 2018-12-23 DIAGNOSIS — I480 Paroxysmal atrial fibrillation: Secondary | ICD-10-CM | POA: Diagnosis not present

## 2018-12-26 DIAGNOSIS — R634 Abnormal weight loss: Secondary | ICD-10-CM | POA: Diagnosis not present

## 2018-12-26 DIAGNOSIS — R0609 Other forms of dyspnea: Secondary | ICD-10-CM | POA: Diagnosis not present

## 2018-12-26 DIAGNOSIS — I509 Heart failure, unspecified: Secondary | ICD-10-CM | POA: Diagnosis not present

## 2018-12-26 DIAGNOSIS — D696 Thrombocytopenia, unspecified: Secondary | ICD-10-CM | POA: Diagnosis not present

## 2018-12-26 DIAGNOSIS — I130 Hypertensive heart and chronic kidney disease with heart failure and stage 1 through stage 4 chronic kidney disease, or unspecified chronic kidney disease: Secondary | ICD-10-CM | POA: Diagnosis not present

## 2018-12-26 DIAGNOSIS — I480 Paroxysmal atrial fibrillation: Secondary | ICD-10-CM | POA: Diagnosis not present

## 2018-12-26 DIAGNOSIS — N182 Chronic kidney disease, stage 2 (mild): Secondary | ICD-10-CM | POA: Diagnosis not present

## 2018-12-26 DIAGNOSIS — R5383 Other fatigue: Secondary | ICD-10-CM | POA: Diagnosis not present

## 2019-01-20 DIAGNOSIS — I2699 Other pulmonary embolism without acute cor pulmonale: Secondary | ICD-10-CM | POA: Diagnosis not present

## 2019-01-20 DIAGNOSIS — Z7901 Long term (current) use of anticoagulants: Secondary | ICD-10-CM | POA: Diagnosis not present

## 2019-02-12 ENCOUNTER — Ambulatory Visit (INDEPENDENT_AMBULATORY_CARE_PROVIDER_SITE_OTHER): Payer: Medicare Other | Admitting: *Deleted

## 2019-02-12 DIAGNOSIS — I495 Sick sinus syndrome: Secondary | ICD-10-CM

## 2019-02-13 LAB — CUP PACEART REMOTE DEVICE CHECK
Battery Remaining Longevity: 63 mo
Battery Voltage: 3 V
Brady Statistic AP VP Percent: 0.48 %
Brady Statistic AP VS Percent: 86.77 %
Brady Statistic AS VP Percent: 0.01 %
Brady Statistic AS VS Percent: 12.73 %
Brady Statistic RA Percent Paced: 86.78 %
Brady Statistic RV Percent Paced: 0.52 %
Date Time Interrogation Session: 20200827161738
Implantable Lead Implant Date: 20060223
Implantable Lead Implant Date: 20060223
Implantable Lead Location: 753859
Implantable Lead Location: 753860
Implantable Lead Model: 5076
Implantable Lead Model: 5076
Implantable Pulse Generator Implant Date: 20151125
Lead Channel Impedance Value: 361 Ohm
Lead Channel Impedance Value: 418 Ohm
Lead Channel Impedance Value: 475 Ohm
Lead Channel Impedance Value: 494 Ohm
Lead Channel Pacing Threshold Amplitude: 0.625 V
Lead Channel Pacing Threshold Amplitude: 0.75 V
Lead Channel Pacing Threshold Pulse Width: 0.4 ms
Lead Channel Pacing Threshold Pulse Width: 0.4 ms
Lead Channel Sensing Intrinsic Amplitude: 2.375 mV
Lead Channel Sensing Intrinsic Amplitude: 5.75 mV
Lead Channel Setting Pacing Amplitude: 2 V
Lead Channel Setting Pacing Amplitude: 2.5 V
Lead Channel Setting Pacing Pulse Width: 0.4 ms
Lead Channel Setting Sensing Sensitivity: 2.8 mV

## 2019-02-14 DIAGNOSIS — L858 Other specified epidermal thickening: Secondary | ICD-10-CM | POA: Diagnosis not present

## 2019-02-14 DIAGNOSIS — Z85828 Personal history of other malignant neoplasm of skin: Secondary | ICD-10-CM | POA: Diagnosis not present

## 2019-02-14 DIAGNOSIS — L821 Other seborrheic keratosis: Secondary | ICD-10-CM | POA: Diagnosis not present

## 2019-02-14 DIAGNOSIS — D1801 Hemangioma of skin and subcutaneous tissue: Secondary | ICD-10-CM | POA: Diagnosis not present

## 2019-02-14 DIAGNOSIS — D485 Neoplasm of uncertain behavior of skin: Secondary | ICD-10-CM | POA: Diagnosis not present

## 2019-02-18 DIAGNOSIS — Z7901 Long term (current) use of anticoagulants: Secondary | ICD-10-CM | POA: Diagnosis not present

## 2019-02-18 DIAGNOSIS — I8290 Acute embolism and thrombosis of unspecified vein: Secondary | ICD-10-CM | POA: Diagnosis not present

## 2019-02-25 ENCOUNTER — Encounter: Payer: Self-pay | Admitting: Cardiology

## 2019-02-25 NOTE — Progress Notes (Signed)
Remote pacemaker transmission.   

## 2019-02-27 DIAGNOSIS — Z23 Encounter for immunization: Secondary | ICD-10-CM | POA: Diagnosis not present

## 2019-04-14 DIAGNOSIS — R945 Abnormal results of liver function studies: Secondary | ICD-10-CM | POA: Diagnosis not present

## 2019-04-14 DIAGNOSIS — K5900 Constipation, unspecified: Secondary | ICD-10-CM | POA: Diagnosis not present

## 2019-04-14 DIAGNOSIS — D696 Thrombocytopenia, unspecified: Secondary | ICD-10-CM | POA: Diagnosis not present

## 2019-04-14 DIAGNOSIS — Z950 Presence of cardiac pacemaker: Secondary | ICD-10-CM | POA: Diagnosis not present

## 2019-04-14 DIAGNOSIS — I480 Paroxysmal atrial fibrillation: Secondary | ICD-10-CM | POA: Diagnosis not present

## 2019-04-14 DIAGNOSIS — R0609 Other forms of dyspnea: Secondary | ICD-10-CM | POA: Diagnosis not present

## 2019-04-14 DIAGNOSIS — I2510 Atherosclerotic heart disease of native coronary artery without angina pectoris: Secondary | ICD-10-CM | POA: Diagnosis not present

## 2019-04-14 DIAGNOSIS — N182 Chronic kidney disease, stage 2 (mild): Secondary | ICD-10-CM | POA: Diagnosis not present

## 2019-04-14 DIAGNOSIS — I509 Heart failure, unspecified: Secondary | ICD-10-CM | POA: Diagnosis not present

## 2019-04-14 DIAGNOSIS — I77810 Thoracic aortic ectasia: Secondary | ICD-10-CM | POA: Diagnosis not present

## 2019-04-14 DIAGNOSIS — I2699 Other pulmonary embolism without acute cor pulmonale: Secondary | ICD-10-CM | POA: Diagnosis not present

## 2019-04-14 DIAGNOSIS — R5383 Other fatigue: Secondary | ICD-10-CM | POA: Diagnosis not present

## 2019-04-14 DIAGNOSIS — I10 Essential (primary) hypertension: Secondary | ICD-10-CM | POA: Diagnosis not present

## 2019-05-11 ENCOUNTER — Emergency Department (HOSPITAL_COMMUNITY): Payer: Medicare Other

## 2019-05-11 ENCOUNTER — Encounter (HOSPITAL_COMMUNITY): Payer: Self-pay | Admitting: Emergency Medicine

## 2019-05-11 ENCOUNTER — Other Ambulatory Visit: Payer: Self-pay

## 2019-05-11 ENCOUNTER — Inpatient Hospital Stay (HOSPITAL_COMMUNITY)
Admission: EM | Admit: 2019-05-11 | Discharge: 2019-05-14 | DRG: 308 | Disposition: A | Discharge status: Home / Self Care | Payer: Medicare Other | Attending: Student | Admitting: Student

## 2019-05-11 DIAGNOSIS — Y92000 Kitchen of unspecified non-institutional (private) residence as  the place of occurrence of the external cause: Secondary | ICD-10-CM

## 2019-05-11 DIAGNOSIS — Z86711 Personal history of pulmonary embolism: Secondary | ICD-10-CM

## 2019-05-11 DIAGNOSIS — E119 Type 2 diabetes mellitus without complications: Secondary | ICD-10-CM | POA: Diagnosis not present

## 2019-05-11 DIAGNOSIS — I495 Sick sinus syndrome: Secondary | ICD-10-CM | POA: Diagnosis present

## 2019-05-11 DIAGNOSIS — I951 Orthostatic hypotension: Secondary | ICD-10-CM | POA: Diagnosis present

## 2019-05-11 DIAGNOSIS — R55 Syncope and collapse: Secondary | ICD-10-CM

## 2019-05-11 DIAGNOSIS — Z20828 Contact with and (suspected) exposure to other viral communicable diseases: Secondary | ICD-10-CM | POA: Diagnosis not present

## 2019-05-11 DIAGNOSIS — S0990XA Unspecified injury of head, initial encounter: Secondary | ICD-10-CM | POA: Diagnosis not present

## 2019-05-11 DIAGNOSIS — Z79899 Other long term (current) drug therapy: Secondary | ICD-10-CM

## 2019-05-11 DIAGNOSIS — S272XXA Traumatic hemopneumothorax, initial encounter: Secondary | ICD-10-CM | POA: Diagnosis present

## 2019-05-11 DIAGNOSIS — S27321A Contusion of lung, unilateral, initial encounter: Secondary | ICD-10-CM | POA: Diagnosis present

## 2019-05-11 DIAGNOSIS — N400 Enlarged prostate without lower urinary tract symptoms: Secondary | ICD-10-CM | POA: Diagnosis present

## 2019-05-11 DIAGNOSIS — Z950 Presence of cardiac pacemaker: Secondary | ICD-10-CM

## 2019-05-11 DIAGNOSIS — I447 Left bundle-branch block, unspecified: Secondary | ICD-10-CM | POA: Diagnosis present

## 2019-05-11 DIAGNOSIS — J939 Pneumothorax, unspecified: Secondary | ICD-10-CM

## 2019-05-11 DIAGNOSIS — S2242XA Multiple fractures of ribs, left side, initial encounter for closed fracture: Secondary | ICD-10-CM | POA: Diagnosis not present

## 2019-05-11 DIAGNOSIS — R0602 Shortness of breath: Secondary | ICD-10-CM | POA: Diagnosis not present

## 2019-05-11 DIAGNOSIS — I10 Essential (primary) hypertension: Secondary | ICD-10-CM | POA: Diagnosis present

## 2019-05-11 DIAGNOSIS — M542 Cervicalgia: Secondary | ICD-10-CM | POA: Diagnosis not present

## 2019-05-11 DIAGNOSIS — I4891 Unspecified atrial fibrillation: Secondary | ICD-10-CM | POA: Diagnosis present

## 2019-05-11 DIAGNOSIS — I4892 Unspecified atrial flutter: Secondary | ICD-10-CM | POA: Diagnosis present

## 2019-05-11 DIAGNOSIS — E785 Hyperlipidemia, unspecified: Secondary | ICD-10-CM | POA: Diagnosis present

## 2019-05-11 DIAGNOSIS — I428 Other cardiomyopathies: Secondary | ICD-10-CM | POA: Diagnosis not present

## 2019-05-11 DIAGNOSIS — W1830XA Fall on same level, unspecified, initial encounter: Secondary | ICD-10-CM | POA: Diagnosis present

## 2019-05-11 DIAGNOSIS — S199XXA Unspecified injury of neck, initial encounter: Secondary | ICD-10-CM | POA: Diagnosis not present

## 2019-05-11 DIAGNOSIS — I471 Supraventricular tachycardia: Principal | ICD-10-CM | POA: Diagnosis present

## 2019-05-11 DIAGNOSIS — Z87891 Personal history of nicotine dependence: Secondary | ICD-10-CM

## 2019-05-11 DIAGNOSIS — Z7901 Long term (current) use of anticoagulants: Secondary | ICD-10-CM

## 2019-05-11 DIAGNOSIS — R519 Headache, unspecified: Secondary | ICD-10-CM | POA: Diagnosis not present

## 2019-05-11 DIAGNOSIS — S22039A Unspecified fracture of third thoracic vertebra, initial encounter for closed fracture: Secondary | ICD-10-CM | POA: Diagnosis not present

## 2019-05-11 DIAGNOSIS — J942 Hemothorax: Secondary | ICD-10-CM

## 2019-05-11 DIAGNOSIS — K5900 Constipation, unspecified: Secondary | ICD-10-CM | POA: Diagnosis present

## 2019-05-11 DIAGNOSIS — S270XXA Traumatic pneumothorax, initial encounter: Secondary | ICD-10-CM | POA: Diagnosis present

## 2019-05-11 LAB — CBC
HCT: 47.1 % (ref 39.0–52.0)
Hemoglobin: 15.5 g/dL (ref 13.0–17.0)
MCH: 28.9 pg (ref 26.0–34.0)
MCHC: 32.9 g/dL (ref 30.0–36.0)
MCV: 87.7 fL (ref 80.0–100.0)
Platelets: 122 10*3/uL — ABNORMAL LOW (ref 150–400)
RBC: 5.37 MIL/uL (ref 4.22–5.81)
RDW: 13.7 % (ref 11.5–15.5)
WBC: 8.5 10*3/uL (ref 4.0–10.5)
nRBC: 0 % (ref 0.0–0.2)

## 2019-05-11 LAB — BASIC METABOLIC PANEL
Anion gap: 7 (ref 5–15)
BUN: 18 mg/dL (ref 8–23)
CO2: 26 mmol/L (ref 22–32)
Calcium: 9.9 mg/dL (ref 8.9–10.3)
Chloride: 103 mmol/L (ref 98–111)
Creatinine, Ser: 1.02 mg/dL (ref 0.61–1.24)
GFR calc Af Amer: >60 mL/min (ref >60)
GFR calc non Af Amer: >60 mL/min (ref >60)
Glucose, Bld: 100 mg/dL — ABNORMAL HIGH (ref 70–99)
Potassium: 4 mmol/L (ref 3.5–5.1)
Sodium: 136 mmol/L (ref 135–145)

## 2019-05-11 LAB — PROTIME-INR
INR: 2.8 — ABNORMAL HIGH (ref 0.8–1.2)
Prothrombin Time: 29.3 seconds — ABNORMAL HIGH (ref 11.4–15.2)

## 2019-05-11 LAB — HEPATIC FUNCTION PANEL
ALT: 20 U/L (ref 0–44)
AST: 27 U/L (ref 15–41)
Albumin: 3.8 g/dL (ref 3.5–5.0)
Alkaline Phosphatase: 54 U/L (ref 38–126)
Bilirubin, Direct: 0.2 mg/dL (ref 0.0–0.2)
Indirect Bilirubin: 1.6 mg/dL — ABNORMAL HIGH (ref 0.3–0.9)
Total Bilirubin: 1.8 mg/dL — ABNORMAL HIGH (ref 0.3–1.2)
Total Protein: 6.8 g/dL (ref 6.5–8.1)

## 2019-05-11 LAB — SARS CORONAVIRUS 2 (TAT 6-24 HRS): SARS Coronavirus 2: NEGATIVE

## 2019-05-11 LAB — BRAIN NATRIURETIC PEPTIDE: B Natriuretic Peptide: 57 pg/mL (ref 0.0–100.0)

## 2019-05-11 LAB — TROPONIN I (HIGH SENSITIVITY)
Troponin I (High Sensitivity): 14 ng/L (ref <18)
Troponin I (High Sensitivity): 15 ng/L (ref <18)

## 2019-05-11 LAB — MAGNESIUM: Magnesium: 2.1 mg/dL (ref 1.7–2.4)

## 2019-05-11 MED ORDER — KETOROLAC TROMETHAMINE 15 MG/ML IJ SOLN: 15.0000 mg | Freq: Four times a day (QID) | INTRAMUSCULAR | Status: DC | PRN

## 2019-05-11 MED ORDER — SODIUM CHLORIDE 0.9% FLUSH: 3.0000 mL | Freq: Once | INTRAVENOUS | Status: DC

## 2019-05-11 MED ORDER — OMEGA-3 FATTY ACIDS 1000 MG PO CAPS: 1.0000 g | ORAL_CAPSULE | Freq: Every morning | ORAL | Status: DC

## 2019-05-11 MED ORDER — MIRTAZAPINE 15 MG PO TABS
15.0000 mg | ORAL_TABLET | Freq: Every day | ORAL | Status: DC
  Administered 2019-05-11 – 2019-05-13 (×3): 15 mg via ORAL
  Filled 2019-05-11 (×4): qty 1

## 2019-05-11 MED ORDER — SODIUM CHLORIDE 0.9 % IV SOLN: 250.0000 mL | INTRAVENOUS | Status: DC | PRN

## 2019-05-11 MED ORDER — WARFARIN - PHARMACIST DOSING INPATIENT
Freq: Every day | Status: DC
  Administered 2019-05-11 – 2019-05-13 (×2)

## 2019-05-11 MED ORDER — SENNOSIDES-DOCUSATE SODIUM 8.6-50 MG PO TABS
2.0000 | ORAL_TABLET | Freq: Two times a day (BID) | ORAL | Status: DC
  Administered 2019-05-11 – 2019-05-14 (×5): 2 via ORAL
  Filled 2019-05-11 (×6): qty 2

## 2019-05-11 MED ORDER — VITAMIN B-12 100 MCG PO TABS
100.0000 ug | ORAL_TABLET | Freq: Every morning | ORAL | Status: DC
  Administered 2019-05-12 – 2019-05-14 (×3): 100 ug via ORAL
  Filled 2019-05-11 (×3): qty 1

## 2019-05-11 MED ORDER — ALIGN 4 MG PO CAPS: ORAL_CAPSULE | Freq: Every morning | ORAL | Status: DC

## 2019-05-11 MED ORDER — LISINOPRIL 5 MG PO TABS
5.0000 mg | ORAL_TABLET | Freq: Every morning | ORAL | Status: DC
  Administered 2019-05-12: 5 mg via ORAL
  Filled 2019-05-11: qty 1

## 2019-05-11 MED ORDER — WARFARIN SODIUM 7.5 MG PO TABS: 7.5000 mg | ORAL_TABLET | ORAL | Status: DC

## 2019-05-11 MED ORDER — OXYCODONE HCL 5 MG PO TABS: 5.0000 mg | ORAL_TABLET | ORAL | Status: DC | PRN

## 2019-05-11 MED ORDER — FINASTERIDE 5 MG PO TABS
5.0000 mg | ORAL_TABLET | Freq: Every morning | ORAL | Status: DC
  Administered 2019-05-12 – 2019-05-14 (×3): 5 mg via ORAL
  Filled 2019-05-11 (×3): qty 1

## 2019-05-11 MED ORDER — VITAMIN D 25 MCG (1000 UNIT) PO TABS
1000.0000 [IU] | ORAL_TABLET | Freq: Every morning | ORAL | Status: DC
  Administered 2019-05-12 – 2019-05-14 (×3): 1000 [IU] via ORAL
  Filled 2019-05-11 (×3): qty 1

## 2019-05-11 MED ORDER — TAMSULOSIN HCL 0.4 MG PO CAPS
0.4000 mg | ORAL_CAPSULE | Freq: Every morning | ORAL | Status: DC
  Administered 2019-05-12 – 2019-05-14 (×3): 0.4 mg via ORAL
  Filled 2019-05-11 (×3): qty 1

## 2019-05-11 MED ORDER — OMEGA-3-ACID ETHYL ESTERS 1 G PO CAPS
1.0000 g | ORAL_CAPSULE | Freq: Every day | ORAL | Status: DC
  Administered 2019-05-12 – 2019-05-14 (×3): 1 g via ORAL
  Filled 2019-05-11 (×3): qty 1

## 2019-05-11 MED ORDER — WARFARIN SODIUM 7.5 MG PO TABS
7.5000 mg | ORAL_TABLET | Freq: Once | ORAL | Status: AC
  Administered 2019-05-11: 7.5 mg via ORAL
  Filled 2019-05-11: qty 1

## 2019-05-11 MED ORDER — IOHEXOL 300 MG/ML  SOLN
75.0000 mL | Freq: Once | INTRAMUSCULAR | Status: AC | PRN
  Administered 2019-05-11: 75 mL via INTRAVENOUS

## 2019-05-11 MED ORDER — SODIUM CHLORIDE 0.9% FLUSH: 3.0000 mL | INTRAVENOUS | Status: DC | PRN

## 2019-05-11 MED ORDER — ACETAMINOPHEN 500 MG PO TABS
1000.0000 mg | ORAL_TABLET | Freq: Four times a day (QID) | ORAL | Status: DC
  Administered 2019-05-11 – 2019-05-13 (×9): 1000 mg via ORAL
  Filled 2019-05-11 (×10): qty 2

## 2019-05-11 MED ORDER — SODIUM CHLORIDE 0.9% FLUSH
3.0000 mL | Freq: Two times a day (BID) | INTRAVENOUS | Status: DC
  Administered 2019-05-11 – 2019-05-14 (×6): 3 mL via INTRAVENOUS

## 2019-05-11 NOTE — ED Triage Notes (Signed)
Pt had syncopal episode yesterday.  States he fell and hit back.  Reports  + LOC for a few seconds.  C/o L sided chest pain since fall, mild sob.  Denies nausea and vomiting.

## 2019-05-11 NOTE — Consult Note (Signed)
CARDIOLOGY CONSULT NOTE       Patient ID: Steven Macdonald MRN: [id number] DOB/AGE: 1935/05/01 84 y.o.  Admit date: 05/11/2019 Referring Physician: T[place] Primary Physician: Crist Infante, MD Primary Cardiologist: Caryl Comes Reason for Consultation: Syncope/SVT  Active Problems:   * No active hospital problems. *   HPI:  84 y.o. no history of CAD. History of flutter ablation with resolved non ischemic DCM Last echo 09/12/17 EF 50-55%. He has history of SSS with Medtronic pacer in place. Review of PACART remote device checks from this year and notes from EP indicate hi has known episodes of SVT that have lasted 10 seconds or less He has been asymptomatic with these and is currently not on beta blocker or AAT.    He has been well until around 8:00 pm after dinner he and wife were cleaning kitchen He fell to floor with no antecedent symptoms By time wife got to him he was asymptomatic in matter of seconds However his back hurt progressively and he came to ER today due to pain in back / ribs on left side. CXR showed mildly displaced left 8 th rib fracture CT chest/ head pending He is on coumadin for history of PAF and INR is Rx at 2.8   Unable to locate Medtronic read out from today in ER   ROS All other systems reviewed and negative except as noted above  Past Medical History:  Diagnosis Date  . Arthritis   . Atrial flutter ([place])    RFCA   . BPH (benign prostatic hypertrophy)   . Cardiomyopathy, nonischemic -resolved    Echo 2008: EF 25% Cardiac CT 2009: Normal cors EF 31% Echo 3/10 EF 45% Echo 6/13: EF 50-55%    . CHF (congestive heart failure) ([place])   . Chicken pox   . Colon polyps    hyperplastic  . Diabetes mellitus ([place])   . ED (erectile dysfunction)   . Gilbert's syndrome    possible  . Hemorrhoids   . Herniated cervical disc   . HLD (hyperlipidemia)   . HTN (hypertension)   . LBBB (left bundle branch block)   . Orthostatic hypotension   . Pacemaker  Medtronic    DOI  2/06  . 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 Pulmonary embolism ([place])   . Sinus node dysfunction ([facility])         Family History  Problem Relation Age of Onset  . Breast cancer Mother   . Colon polyps Father        pre-cancerous  . Bladder Cancer Father     Social History   Socioeconomic History  . Marital status: Married    Spouse name: Not on file  . Number of children: 4  . Years of education: Not on file  . Highest education level: Not on file  Occupational History  . Occupation: Insurance underwriter: SELF-EMPLOYED  Social Needs  . Financial resource strain: Not on file  . Food insecurity    Worry: Not on file    Inability: Not on file  . Transportation needs    Medical: Not on file    Non-medical: Not on file  Tobacco Use  . Smoking status: Former Smoker    Quit date: 10/17/1970    Years since quitting: 48.5  . Smokeless tobacco: Never Used  Substance and Sexual Activity  . Alcohol use: Yes    Comment: 2 glasses wine daily  . Drug use: No  . Sexual activity: Not on  file  Lifestyle  . Physical activity    Days per week: Not on file    Minutes per session: Not on file  . Stress: Not on file  Relationships  . Social Herbalist on phone: Not on file    Gets together: Not on file    Attends religious service: Not on file    Active member of club or organization: Not on file    Attends meetings of clubs or organizations: Not on file    Relationship status: Not on file  . Intimate partner violence    Fear of current or ex partner: Not on file    Emotionally abused: Not on file    Physically abused: Not on file    Forced sexual activity: Not on file  Other Topics Concern  . Not on file  Social History Narrative  . Not on file    Past Surgical History:  Procedure Laterality Date  . CARDIOVERSION    . coronary ablation    . CYSTOSCOPY W/ TRANSURETHRAL RESECTION OF POSTERIOR URETHERAL VALVES    . PACEMAKER GENERATOR CHANGE N/A 05/13/2014   Procedure:  PACEMAKER GENERATOR CHANGE;  Surgeon: Deboraha Sprang, MD;  Location: [facility] CATH LAB;  Service: Cardiovascular;  Laterality: N/A;  . PACEMAKER INSERTION       . sodium chloride flush  3 mL Intravenous Once     Physical Exam: Blood pressure (!) 142/83, pulse (!) 59, temperature 97.8 F (36.6 C), temperature source Oral, resp. rate 16, SpO2 98 %.    Affect appropriate Elderly white male  HEENT: normal Neck supple with no adenopathy JVP normal no bruits no thyromegaly Lungs clear with no wheezing and good diaphragmatic motion Heart:  S1/S2 no murmur, no rub, gallop or click PMI normal pacer under right clavicle  Abdomen: benighn, BS positve, no tenderness, no AAA no bruit.  No HSM or HJR Distal pulses intact with no bruits No edema Neuro non-focal Skin warm and dry Some bruising over back near 8 th rib    Labs:   Lab Results  Component Value Date   WBC 8.5 05/11/2019   HGB 15.5 05/11/2019   HCT 47.1 05/11/2019   MCV 87.7 05/11/2019   PLT 122 (L) 05/11/2019    Recent Labs  Lab 05/11/19 1248 05/11/19 1356  NA 136  --   K 4.0  --   CL 103  --   CO2 26  --   BUN 18  --   CREATININE 1.02  --   CALCIUM 9.9  --   PROT  --  6.8  BILITOT  --  1.8*  ALKPHOS  --  54  ALT  --  20  AST  --  27  GLUCOSE 100*  --    No results found for: CKTOTAL, CKMB, CKMBINDEX, TROPONINI No results found for: CHOL No results found for: HDL No results found for: LDLCALC No results found for: TRIG No results found for: CHOLHDL No results found for: LDLDIRECT    Radiology: Dg Chest 2 View  Result Date: 05/11/2019 CLINICAL DATA:  Pt had syncopal episode yesterday. States he fell and hit back. Reports + LOC for a few seconds. C/o L sided chest pain since fall, mild sob. Denies nausea and vomiting. EXAM: CHEST - 2 VIEW COMPARISON:  None. FINDINGS: Mildly displaced fracture of the lateral left eighth rib. No other visualized fractures. No pneumothorax or pleural effusion. There is linear  opacity at the lung bases, greater  on the left, consistent with atelectasis. Remainder of the lungs is clear. Cardiac silhouette is mildly enlarged. No mediastinal or hilar masses. Stable right anterior chest wall pacemaker. IMPRESSION: 1. Mildly displaced fracture of the lateral left eighth rib. No pneumothorax. 2. Mild lung base atelectasis, left greater than right. Electronically Signed   By: Lajean Manes M.D.   On: 05/11/2019 13:11    EKG: AV pacing LBBB morphology    ASSESSMENT AND PLAN:   1. Syncope:  Pacer appears to be functioning normally He has had self limited SVT on remote checks in past Would be unusual for this to cause syncope with normal LV function and no history of CAD. EP will see in am and review to see if he may have had longer bout of SVT around 8:00 pm last night and if so ? Need for ablation , beta blocker or AAT. For now will watch on telemetry Will order echo to make sure EF remains low normal since March 2019 given wifes thoughts that he has had more fatigue with walking lately   2. Rib Fracture:  Pain control per primary service CT chest being done to further evaluate  3. Anticoagulation:  INR Rx 2.8 head and neck CT pending if normal ok to continue   Signed: Jenkins Rouge 05/11/2019, 2:52 PM

## 2019-05-11 NOTE — ED Notes (Signed)
ED TO INPATIENT HANDOFF REPORT  ED Nurse Name and Phone #: Mattison Stuckey   S Name/Age/Gender Steven Macdonald 84 y.o. male Room/Bed: 009C/009C  Code Status   Code Status: Full Code  Home/SNF/Other Home Patient oriented to: self, place, time and situation Is this baseline? Yes   Triage Complete: Triage complete  Chief Complaint chest pain  Triage Note Pt had syncopal episode yesterday.  States he fell and hit back.  Reports  + LOC for a few seconds.  C/o L sided chest pain since fall, mild sob.  Denies nausea and vomiting.   Allergies No Known Allergies  Level of Care/Admitting Diagnosis ED Disposition    ED Disposition Condition Comment   Admit  Hospital Area: [facility] [[id number]]  Level of Care: Telemetry Cardiac [103]  I expect the patient will be discharged within 24 hours: No (not a candidate for 5C-Observation unit)  Covid Evaluation: Asymptomatic Screening Protocol (No Symptoms)  Diagnosis: SVT (supraventricular tachycardia) [facility]) [id number]  Admitting Physician: Lady Deutscher [id number]  Attending Physician: Lady Deutscher [id number]  PT Class (Do Not Modify): Observation [104]  PT Acc Code (Do Not Modify): Observation [10022]       B Medical/Surgery History Past Medical History:  Diagnosis Date  . Arthritis   . Atrial flutter ([place])    RFCA   . BPH (benign prostatic hypertrophy)   . Cardiomyopathy, nonischemic -resolved    Echo 2008: EF 25% Cardiac CT 2009: Normal cors EF 31% Echo 3/10 EF 45% Echo 6/13: EF 50-55%    . CHF (congestive heart failure) ([place])   . Chicken pox   . Colon polyps    hyperplastic  . Diabetes mellitus ([place])   . ED (erectile dysfunction)   . Gilbert's syndrome    possible  . Hemorrhoids   . Herniated cervical disc   . HLD (hyperlipidemia)   . HTN (hypertension)   . LBBB (left bundle branch block)   . Orthostatic hypotension   . Pacemaker  [employer]    DOI 2/06  . 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[place])   . Pulmonary  embolism ([place])   . Sinus node dysfunction [facility])        Past Surgical History:  Procedure Laterality Date  . CARDIOVERSION    . coronary ablation    . CYSTOSCOPY W/ TRANSURETHRAL RESECTION OF POSTERIOR URETHERAL VALVES    . PACEMAKER GENERATOR CHANGE N/A 05/13/2014   Procedure: PACEMAKER GENERATOR CHANGE;  Surgeon: Deboraha Sprang, MD;  Location: [facility] CATH LAB;  Service: Cardiovascular;  Laterality: N/A;  . PACEMAKER INSERTION       A IV Location/Drains/Wounds Patient Lines/Drains/Airways Status   Active Line/Drains/Airways    Name:   Placement date:   Placement time:   Site:   Days:   Peripheral IV 10/24/17 Right Antecubital   10/24/17    1414    Antecubital   564   Peripheral IV 05/11/19 Left Forearm   05/11/19    1345    Forearm   less than 1   Urethral Catheter Candace Nuckles, RN Non-latex 16 Fr.   03/29/17    0410    Non-latex   773          Intake/Output Last 24 hours No intake or output data in the 24 hours ending 05/11/19 1956  Labs/Imaging Results for orders placed or performed during the hospital encounter of 05/11/19 (from the past 48 hour(s))  Basic metabolic panel     Status: Abnormal   Collection Time:  05/11/19 12:48 PM  Result Value Ref Range   Sodium 136 135 - 145 mmol/L   Potassium 4.0 3.5 - 5.1 mmol/L   Chloride 103 98 - 111 mmol/L   CO2 26 22 - 32 mmol/L   Glucose, Bld 100 (H) 70 - 99 mg/dL   BUN 18 8 - 23 mg/dL   Creatinine, Ser 1.02 0.61 - 1.24 mg/dL   Calcium 9.9 8.9 - 10.3 mg/dL   GFR calc non Af Amer >60 >60 mL/min   GFR calc Af Amer >60 >60 mL/min   Anion gap 7 5 - 15    Comment: Performed at [facility] [address], [place], [place] [address]  CBC     Status: Abnormal   Collection Time: 05/11/19 12:48 PM  Result Value Ref Range   WBC 8.5 4.0 - 10.5 K/uL   RBC 5.37 4.22 - 5.81 MIL/uL   Hemoglobin 15.5 13.0 - 17.0 g/dL   HCT 47.1 39.0 - 52.0 %   MCV 87.7 80.0 - 100.0 fL   MCH 28.9 26.0 - 34.0 pg   MCHC 32.9 30.0 - 36.0 g/dL    RDW 13.7 11.5 - 15.5 %   Platelets 122 (L) 150 - 400 K/uL    Comment: REPEATED TO VERIFY   nRBC 0.0 0.0 - 0.2 %    Comment: Performed at [facility], [place] [address], [place], [place] [address]  Troponin I (High Sensitivity)     Status: None   Collection Time: 05/11/19 12:48 PM  Result Value Ref Range   Troponin I (High Sensitivity) 14 <18 ng/L    Comment: (NOTE) Elevated high sensitivity troponin I (hsTnI) values and significant  changes across serial measurements may suggest ACS but many other  chronic and acute conditions are known to elevate hsTnI results.  Refer to the "Links" section for chest pain algorithms and additional  guidance. Performed at [facility], [place] [address] [place], [place] [address]   Hepatic function panel     Status: Abnormal   Collection Time: 05/11/19  1:56 PM  Result Value Ref Range   Total Protein 6.8 6.5 - 8.1 g/dL   Albumin 3.8 3.5 - 5.0 g/dL   AST 27 15 - 41 U/L   ALT 20 0 - 44 U/L   Alkaline Phosphatase 54 38 - 126 U/L   Total Bilirubin 1.8 (H) 0.3 - 1.2 mg/dL   Bilirubin, Direct 0.2 0.0 - 0.2 mg/dL   Indirect Bilirubin 1.6 (H) 0.3 - 0.9 mg/dL    Comment: Performed at [facility] [address] [place], [place] [address]  Magnesium     Status: None   Collection Time: 05/11/19  1:56 PM  Result Value Ref Range   Magnesium 2.1 1.7 - 2.4 mg/dL    Comment: Performed at [facility] [address], [place], [place] [address]  Protime-INR     Status: Abnormal   Collection Time: 05/11/19  1:56 PM  Result Value Ref Range   Prothrombin Time 29.3 (H) 11.4 - 15.2 seconds   INR 2.8 (H) 0.8 - 1.2    Comment: (NOTE) INR goal varies based on device and disease states. Performed at [facility], [place] [address] [place], [place] [address]   Brain natriuretic peptide     Status: None   Collection Time: 05/11/19  3:13 PM  Result Value Ref Range   B Natriuretic Peptide 57.0 0.0 - 100.0 pg/mL  Comment: Performed at  [facility], [place] [address], [place], [place] [address]  Troponin I (High Sensitivity)     Status: None   Collection Time: 05/11/19  3:13 PM  Result Value Ref Range   Troponin I (High Sensitivity) 15 <18 ng/L    Comment: (NOTE) Elevated high sensitivity troponin I (hsTnI) values and significant  changes across serial measurements may suggest ACS but many other  chronic and acute conditions are known to elevate hsTnI results.  Refer to the "Links" section for chest pain algorithms and additional  guidance. Performed at [facility], [place] [address] [place], [place] [address]    Dg Chest 2 View  Result Date: 05/11/2019 CLINICAL DATA:  Pt had syncopal episode yesterday. States he fell and hit back. Reports + LOC for a few seconds. C/o L sided chest pain since fall, mild sob. Denies nausea and vomiting. EXAM: CHEST - 2 VIEW COMPARISON:  None. FINDINGS: Mildly displaced fracture of the lateral left eighth rib. No other visualized fractures. No pneumothorax or pleural effusion. There is linear opacity at the lung bases, greater on the left, consistent with atelectasis. Remainder of the lungs is clear. Cardiac silhouette is mildly enlarged. No mediastinal or hilar masses. Stable right anterior chest wall pacemaker. IMPRESSION: 1. Mildly displaced fracture of the lateral left eighth rib. No pneumothorax. 2. Mild lung base atelectasis, left greater than right. Electronically Signed   By: Lajean Manes M.D.   On: 05/11/2019 13:11   Ct Head Wo Contrast  Result Date: 05/11/2019 CLINICAL DATA:  Syncopal episode with a fall from standing. Head and neck pain. EXAM: CT HEAD WITHOUT CONTRAST CT CERVICAL SPINE WITHOUT CONTRAST TECHNIQUE: Multidetector CT imaging of the head and cervical spine was performed following the standard protocol without intravenous contrast. Multiplanar CT image reconstructions of the cervical spine were also generated. COMPARISON:  None. FINDINGS: CT HEAD FINDINGS Brain:  No evidence of acute infarction, hemorrhage, hydrocephalus, extra-axial collection or mass lesion/mass effect. There is mild cerebral volume loss with associated ex vacuo dilatation. Periventricular white matter hypoattenuation likely represents chronic small vessel ischemic disease. Vascular: There are vascular calcifications in the carotid siphons. Skull: Normal. Negative for fracture or focal lesion. Sinuses/Orbits: There is bilateral maxillary, bilateral ethmoid, and right frontal sinus disease. Other: None. CT CERVICAL SPINE FINDINGS Alignment: Normal. Skull base and vertebrae: No acute fracture. No primary bone lesion or focal pathologic process. Soft tissues and spinal canal: No prevertebral fluid or swelling. No visible canal hematoma. Disc levels: Multilevel degenerative disc and joint disease results in up to severe neuroforaminal stenosis and moderate to severe central canal stenosis. Upper chest: A left pneumothorax is partially imaged. 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 IMPRESSION: 1. No acute intracranial process. 2. No acute osseous injury in the cervical spine. 3. Partially imaged left pneumothorax. Electronically Signed   By: Zerita Boers M.D.   On: 05/11/2019 15:07   Ct Chest W Contrast  Addendum Date: 05/11/2019   ADDENDUM REPORT: 05/11/2019 15:51 ADDENDUM: In the FINDINGS section, the report should note that the left eighth rib fracture is displaced and overriding. Critical Value/emergent results were called by telephone at the time of interpretation on 05/11/2019 at 3:49 pm to [place] , who verbally acknowledged these results. Electronically Signed   By: Zerita Boers M.D.   On: 05/11/2019 15:51   Result Date: 05/11/2019 CLINICAL DATA:  Syncopal episode with a fall. EXAM: CT CHEST  WITH CONTRAST TECHNIQUE: Multidetector CT imaging of the chest was performed during intravenous contrast administration. CONTRAST:   50mL OMNIPAQUE IOHEXOL 300 MG/ML  SOLN COMPARISON:  Chest radiograph from the same day and CT chest dated 07/26/2017. FINDINGS: Cardiovascular: The ascending aorta measures 4.0 cm in diameter, not significantly changed since 11/25/2018 vascular calcifications are seen in the aortic arch. The heart is enlarged. No pericardial effusion. A right chest cardiac device has leads terminating in the right atrium and right ventricle. Mediastinum/Nodes: No enlarged mediastinal, hilar, or axillary lymph nodes. Thyroid gland, trachea, and esophagus demonstrate no significant findings. Lungs/Pleura: There is a small left hemopneumothorax. Ground-glass opacities and consolidation in the left lower lung likely represent contusion. Ground-glass opacities in the right upper lobe may represent contusion. There is mild dependent atelectasis in the right lower lobe. There is no right pleural effusion or pneumothorax. Upper Abdomen: No acute abnormality. Musculoskeletal: There fractures of the left third and 8 transverse processes. There fractures of the posteromedial left sixth, seventh, and tenth ribs as well as the lateral seventh through ninth ribs. IMPRESSION: 1. Fractures of left sixth through tenth ribs as well as the left third and 8 transverse processes. The left seventh rib is a segmental fracture. 2. Small left hemopneumothorax. 3. Ground-glass opacities and consolidation in the left lower lung likely represent contusion. 4. Ground-glass opacities in the right upper lobe may represent contusion. Initial follow-up with CT at 6-12 months is recommended to confirm persistence. If persistent, repeat CT is recommended every 2 years until 5 years of stability has been established. This recommendation follows the consensus statement: Guidelines for Management of Incidental Pulmonary Nodules Detected on CT Images: From the Fleischner Society 2017; Radiology 2017; 284:228-243. 5. Unchanged ascending aortic aneurysm measuring 4.0 cm.  Recommend semi-annual imaging followup by CTA or MRA and referral to cardiothoracic surgery if not already obtained. This recommendation follows 2010 ACCF/AHA/AATS/ACR/ASA/SCA/SCAI/SIR/STS/SVM Guidelines for the Diagnosis and Management of Patients With Thoracic Aortic Disease. Circulation. 2010; 121JG:4281962. Aortic aneurysm NOS (ICD10-I71.9) Aortic Atherosclerosis (ICD10-I70.0). Aortic aneurysm NOS (ICD10-I71.9). Electronically Signed: By: Zerita Boers M.D. On: 05/11/2019 15:25   Ct Cervical Spine Wo Contrast  Result Date: 05/11/2019 CLINICAL DATA:  Syncopal episode with a fall from standing. Head and neck pain. EXAM: CT HEAD WITHOUT CONTRAST CT CERVICAL SPINE WITHOUT CONTRAST TECHNIQUE: Multidetector CT imaging of the head and cervical spine was performed following the standard protocol without intravenous contrast. Multiplanar CT image reconstructions of the cervical spine were also generated. COMPARISON:  None. FINDINGS: CT HEAD FINDINGS Brain: No evidence of acute infarction, hemorrhage, hydrocephalus, extra-axial collection or mass lesion/mass effect. There is mild cerebral volume loss with associated ex vacuo dilatation. Periventricular white matter hypoattenuation likely represents chronic small vessel ischemic disease. Vascular: There are vascular calcifications in the carotid siphons. Skull: Normal. Negative for fracture or focal lesion. Sinuses/Orbits: There is bilateral maxillary, bilateral ethmoid, and right frontal sinus disease. Other: None. CT CERVICAL SPINE FINDINGS Alignment: Normal. Skull base and vertebrae: No acute fracture. No primary bone lesion or focal pathologic process. Soft tissues and spinal canal: No prevertebral fluid or swelling. No visible canal hematoma. Disc levels: Multilevel degenerative disc and joint disease results in up to severe neuroforaminal stenosis and moderate to severe central canal stenosis. Upper chest: A left pneumothorax is partially imaged. 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 IMPRESSION: 1. No acute intracranial process. 2. No acute osseous injury in the cervical spine. 3. Partially imaged left  pneumothorax. Electronically Signed   By: Zerita Boers M.D.   On: 05/11/2019 15:07    Pending Labs Unresulted Labs (From admission, onward)    Start     Ordered   05/12/19 [id number]  Basic metabolic panel  Tomorrow morning,   R     05/11/19 1729   05/12/19 0500  CBC  Tomorrow morning,   R     05/11/19 1729   05/12/19 0500  Protime-INR  Daily,   R     05/11/19 1740   05/11/19 1558  SARS CORONAVIRUS 2 (TAT 6-24 HRS) Nasopharyngeal Nasopharyngeal Swab  (Asymptomatic/Tier 3)  Once,   STAT    Question Answer Comment  Is this test for diagnosis or screening Screening   Symptomatic for COVID-19 as defined by CDC No   Hospitalized for COVID-19 No   Admitted to ICU for COVID-19 No   Previously tested for COVID-19 No   Resident in a congregate (group) care setting Unknown   Employed in healthcare setting Unknown      05/11/19 1557          Vitals/Pain Today's Vitals   05/11/19 1855 05/11/19 1900 05/11/19 1915 05/11/19 1930  BP: (!) 157/81 130/83 119/78 118/73  Pulse: 64 (!) 59 (!) 58 60  Resp: 16 (!) 21  20  Temp:      TempSrc:      SpO2: 100% 100% 98% 99%  PainSc:        Isolation Precautions No active isolations  Medications Medications  sodium chloride flush (NS) 0.9 % injection 3 mL (3 mLs Intravenous Not Given 05/11/19 1345)  ketorolac (TORADOL) 15 MG/ML injection 15 mg (has no administration in time range)  oxyCODONE (Oxy IR/ROXICODONE) immediate release tablet 5 mg (has no administration in time range)  lisinopril (ZESTRIL) tablet 5 mg (has no administration in time range)  mirtazapine (REMERON) tablet 15 mg (has no administration in time range)  finasteride (PROSCAR) tablet 5 mg (has no administration in time range)  tamsulosin (FLOMAX) capsule 0.4 mg (has no administration in  time range)  vitamin B-12 (CYANOCOBALAMIN) tablet 100 mcg (has no administration in time range)  cholecalciferol (VITAMIN D3) tablet 1,000 Units (has no administration in time range)  fish oil-omega-3 fatty acids capsule 1 g (has no administration in time range)  acetaminophen (TYLENOL) tablet 1,000 mg (1,000 mg Oral Given 05/11/19 1753)  senna-docusate (Senokot-S) tablet 2 tablet (has no administration in time range)  sodium chloride flush (NS) 0.9 % injection 3 mL (has no administration in time range)  sodium chloride flush (NS) 0.9 % injection 3 mL (has no administration in time range)  0.9 %  sodium chloride infusion (has no administration in time range)  Warfarin - Pharmacist Dosing Inpatient ( Does not apply Given 05/11/19 1816)  iohexol (OMNIPAQUE) 300 MG/ML solution 75 mL (75 mLs Intravenous Contrast Given 05/11/19 1445)  warfarin (COUMADIN) tablet 7.5 mg (7.5 mg Oral Given 05/11/19 1816)    Mobility walks Low fall risk   Focused Assessments Cardiac Assessment Handoff:  Cardiac Rhythm: Ventricular paced No results found for: CKTOTAL, CKMB, CKMBINDEX, TROPONINI Lab Results  Component Value Date   DDIMER (H) 02/19/2008    3.70        AT THE INHOUSE ESTABLISHED CUTOFF VALUE OF 0.48 ug/mL FEU, THIS ASSAY HAS BEEN DOCUMENTED IN THE LITERATURE TO HAVE   Does the Patient currently have chest pain? No     R Recommendations: See Admitting Provider Note  Report given to:  Additional Notes:

## 2019-05-11 NOTE — Progress Notes (Signed)
[place] for warfarin Indication: atrial fibrillation   Assessment: 61 yom on warfarin PTA for hx PE, afib presenting with rib fractures s/p fall last night. Pharmacy consulted to dose inpatient. INR therapeutic 2.8 on admit. Imaging negative for bleed. No other active bleed issues documented. Hg wnl, plt 122 on admit.  PTA warfarin dose: 7.5mg  daily except 10mg  on MWF (last dose 05/10/19 PTA)  Goal of Therapy:  INR 2-3 Monitor platelets by anticoagulation protocol: Yes   Plan:  Warfarin 7.5mg  PO x 1 dose - continue home dose for now Daily INR Monitor CBC, s/sx bleeding  Elicia Lamp, PharmD, BCPS Clinical Pharmacist 05/11/2019 5:36 PM

## 2019-05-11 NOTE — Consult Note (Addendum)
Reason for Consult: Rib fractures, hemopneumothorax Referring Physician: Dr. Cain Saupe is an 84 y.o. male.  HPI: Patient is a 84 year old male, with a history of a flutter, cardiomyopathy, diabetes, hyperlipidemia, with pacer in place, who states he felt last night approximately 8 PM.  In speaking to him and his wife patient was standing in the kitchen and fell abruptly.  According to the patient's wife he fell on his left side onto the hardwood floor.  Patient had continued shortness of breath throughout the day.  Patient was brought in for further evaluation secondary to pain.  Patient was evaluated per cardiology secondary to possible syncopal episode.  It was thought that patient had a longer bout of SVT.  Patient to undergo further work-up in a.m.  CT scan on arrival was found to have multiple left-sided rib fractures, left hemopneumothorax, TP fracture x2.  Trauma surgery was consulted for management of thoracic trauma.  I reviewed his CT scan and laboratory findings personally.  Of note patient currently on Coumadin secondary to PEs in the past.  Past Medical History:  Diagnosis Date  . Arthritis   . Atrial flutter ([place])    RFCA   . BPH (benign prostatic hypertrophy)   . Cardiomyopathy, nonischemic -resolved    Echo 2008: EF 25% Cardiac CT 2009: Normal cors EF 31% Echo 3/10 EF 45% Echo 6/13: EF 50-55%    . CHF (congestive heart failure) ([place])   . Chicken pox   . Colon polyps    hyperplastic  . Diabetes mellitus ([place])   . ED (erectile dysfunction)   . Gilbert's syndrome    possible  . Hemorrhoids   . Herniated cervical disc   . HLD (hyperlipidemia)   . HTN (hypertension)   . LBBB (left bundle branch block)   . Orthostatic hypotension   . Pacemaker  [employer]    DOI 2/06  . 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 Pulmonary embolism ([place])   . Sinus node dysfunction [facility])         Past Surgical History:  Procedure Laterality Date  . CARDIOVERSION    . coronary  ablation    . CYSTOSCOPY W/ TRANSURETHRAL RESECTION OF POSTERIOR URETHERAL VALVES    . PACEMAKER GENERATOR CHANGE N/A 05/13/2014   Procedure: PACEMAKER GENERATOR CHANGE;  Surgeon: Deboraha Sprang, MD;  Location: [facility] CATH LAB;  Service: Cardiovascular;  Laterality: N/A;  . PACEMAKER INSERTION      Family History  Problem Relation Age of Onset  . Breast cancer Mother   . Colon polyps Father        pre-cancerous  . Bladder Cancer Father     Social History:  reports that he quit smoking about 48 years ago. He has never used smokeless tobacco. He reports current alcohol use. He reports that he does not use drugs.  Allergies: No Known Allergies  Medications: I have reviewed the patient's current medications.  Results for orders placed or performed during the hospital encounter of 05/11/19 (from the past 48 hour(s))  Basic metabolic panel     Status: Abnormal   Collection Time: 05/11/19 12:48 PM  Result Value Ref Range   Sodium 136 135 - 145 mmol/L   Potassium 4.0 3.5 - 5.1 mmol/L   Chloride 103 98 - 111 mmol/L   CO2 26 22 - 32 mmol/L   Glucose, Bld 100 (H) 70 - 99 mg/dL   BUN 18 8 - 23 mg/dL   Creatinine, Ser 1.02 0.61 - 1.24  mg/dL   Calcium 9.9 8.9 - 10.3 mg/dL   GFR calc non Af Amer >60 >60 mL/min   GFR calc Af Amer >60 >60 mL/min   Anion gap 7 5 - 15    Comment: Performed at [facility] [address], [place], [place] [address]  CBC     Status: Abnormal   Collection Time: 05/11/19 12:48 PM  Result Value Ref Range   WBC 8.5 4.0 - 10.5 K/uL   RBC 5.37 4.22 - 5.81 MIL/uL   Hemoglobin 15.5 13.0 - 17.0 g/dL   HCT 47.1 39.0 - 52.0 %   MCV 87.7 80.0 - 100.0 fL   MCH 28.9 26.0 - 34.0 pg   MCHC 32.9 30.0 - 36.0 g/dL   RDW 13.7 11.5 - 15.5 %   Platelets 122 (L) 150 - 400 K/uL    Comment: REPEATED TO VERIFY   nRBC 0.0 0.0 - 0.2 %    Comment: Performed at [facility], [place] [address], [place], [place] [address]  Troponin I (High Sensitivity)     Status: None    Collection Time: 05/11/19 12:48 PM  Result Value Ref Range   Troponin I (High Sensitivity) 14 <18 ng/L    Comment: (NOTE) Elevated high sensitivity troponin I (hsTnI) values and significant  changes across serial measurements may suggest ACS but many other  chronic and acute conditions are known to elevate hsTnI results.  Refer to the "Links" section for chest pain algorithms and additional  guidance. Performed at [facility], [place] [address], [place], [place] [address]   Hepatic function panel     Status: Abnormal   Collection Time: 05/11/19  1:56 PM  Result Value Ref Range   Total Protein 6.8 6.5 - 8.1 g/dL   Albumin 3.8 3.5 - 5.0 g/dL   AST 27 15 - 41 U/L   ALT 20 0 - 44 U/L   Alkaline Phosphatase 54 38 - 126 U/L   Total Bilirubin 1.8 (H) 0.3 - 1.2 mg/dL   Bilirubin, Direct 0.2 0.0 - 0.2 mg/dL   Indirect Bilirubin 1.6 (H) 0.3 - 0.9 mg/dL    Comment: Performed at [facility] [address] [place], [place] [address]  Magnesium     Status: None   Collection Time: 05/11/19  1:56 PM  Result Value Ref Range   Magnesium 2.1 1.7 - 2.4 mg/dL    Comment: Performed at [facility] [address], [place], [place] [address]  Protime-INR     Status: Abnormal   Collection Time: 05/11/19  1:56 PM  Result Value Ref Range   Prothrombin Time 29.3 (H) 11.4 - 15.2 seconds   INR 2.8 (H) 0.8 - 1.2    Comment: (NOTE) INR goal varies based on device and disease states. Performed at [facility], [place] [address] [place], [place] [address]   Brain natriuretic peptide     Status: None   Collection Time: 05/11/19  3:13 PM  Result Value Ref Range   B Natriuretic Peptide 57.0 0.0 - 100.0 pg/mL    Comment: Performed at [facility] [address], [place],  [address]  Troponin I (High Sensitivity)     Status: None   Collection Time: 05/11/19  3:13 PM  Result Value Ref Range   Troponin I (High Sensitivity) 15 <18 ng/L    Comment: (NOTE) Elevated high  sensitivity troponin I (hsTnI) values and significant  changes across  serial measurements may suggest ACS but many other  chronic and acute conditions are known to elevate hsTnI results.  Refer to the "Links" section for chest pain algorithms and additional  guidance. Performed at [facility], [place] [address], [place], [place] [address]     Dg Chest 2 View  Result Date: 05/11/2019 CLINICAL DATA:  Pt had syncopal episode yesterday. States he fell and hit back. Reports + LOC for a few seconds. C/o L sided chest pain since fall, mild sob. Denies nausea and vomiting. EXAM: CHEST - 2 VIEW COMPARISON:  None. FINDINGS: Mildly displaced fracture of the lateral left eighth rib. No other visualized fractures. No pneumothorax or pleural effusion. There is linear opacity at the lung bases, greater on the left, consistent with atelectasis. Remainder of the lungs is clear. Cardiac silhouette is mildly enlarged. No mediastinal or hilar masses. Stable right anterior chest wall pacemaker. IMPRESSION: 1. Mildly displaced fracture of the lateral left eighth rib. No pneumothorax. 2. Mild lung base atelectasis, left greater than right. Electronically Signed   By: Lajean Manes M.D.   On: 05/11/2019 13:11   Ct Head Wo Contrast  Result Date: 05/11/2019 CLINICAL DATA:  Syncopal episode with a fall from standing. Head and neck pain. EXAM: CT HEAD WITHOUT CONTRAST CT CERVICAL SPINE WITHOUT CONTRAST TECHNIQUE: Multidetector CT imaging of the head and cervical spine was performed following the standard protocol without intravenous contrast. Multiplanar CT image reconstructions of the cervical spine were also generated. COMPARISON:  None. FINDINGS: CT HEAD FINDINGS Brain: No evidence of acute infarction, hemorrhage, hydrocephalus, extra-axial collection or mass lesion/mass effect. There is mild cerebral volume loss with associated ex vacuo dilatation. Periventricular white matter hypoattenuation likely represents chronic  small vessel ischemic disease. Vascular: There are vascular calcifications in the carotid siphons. Skull: Normal. Negative for fracture or focal lesion. Sinuses/Orbits: There is bilateral maxillary, bilateral ethmoid, and right frontal sinus disease. Other: None. CT CERVICAL SPINE FINDINGS Alignment: Normal. Skull base and vertebrae: No acute fracture. No primary bone lesion or focal pathologic process. Soft tissues and spinal canal: No prevertebral fluid or swelling. No visible canal hematoma. Disc levels: Multilevel degenerative disc and joint disease results in up to severe neuroforaminal stenosis and moderate to severe central canal stenosis. Upper chest: A left pneumothorax is partially imaged. 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 IMPRESSION: 1. No acute intracranial process. 2. No acute osseous injury in the cervical spine. 3. Partially imaged left pneumothorax. Electronically Signed   By: Zerita Boers M.D.   On: 05/11/2019 15:07   Ct Chest W Contrast  Addendum Date: 05/11/2019   ADDENDUM REPORT: 05/11/2019 15:51 ADDENDUM: In the FINDINGS section, the report should note that the left eighth rib fracture is displaced and overriding. Critical Value/emergent results were called by telephone at the time of interpretation on 05/11/2019 at 3:49 pm to [place] , who verbally acknowledged these results. Electronically Signed   By: Zerita Boers M.D.   On: 05/11/2019 15:51   Result Date: 05/11/2019 CLINICAL DATA:  Syncopal episode with a fall. EXAM: CT CHEST WITH CONTRAST TECHNIQUE: Multidetector CT imaging of the chest was performed during intravenous contrast administration. CONTRAST:  52mL OMNIPAQUE IOHEXOL 300 MG/ML  SOLN COMPARISON:  Chest radiograph from the same day and CT chest dated 07/26/2017. FINDINGS: Cardiovascular: The ascending aorta measures 4.0 cm in diameter, not significantly changed since 11/25/2018 vascular  calcifications are seen in the aortic arch. The heart is  enlarged. No pericardial effusion. A right chest cardiac device has leads terminating in the right atrium and right ventricle. Mediastinum/Nodes: No enlarged mediastinal, hilar, or axillary lymph nodes. Thyroid gland, trachea, and esophagus demonstrate no significant findings. Lungs/Pleura: There is a small left hemopneumothorax. Ground-glass opacities and consolidation in the left lower lung likely represent contusion. Ground-glass opacities in the right upper lobe may represent contusion. There is mild dependent atelectasis in the right lower lobe. There is no right pleural effusion or pneumothorax. Upper Abdomen: No acute abnormality. Musculoskeletal: There fractures of the left third and 8 transverse processes. There fractures of the posteromedial left sixth, seventh, and tenth ribs as well as the lateral seventh through ninth ribs. IMPRESSION: 1. Fractures of left sixth through tenth ribs as well as the left third and 8 transverse processes. The left seventh rib is a segmental fracture. 2. Small left hemopneumothorax. 3. Ground-glass opacities and consolidation in the left lower lung likely represent contusion. 4. Ground-glass opacities in the right upper lobe may represent contusion. Initial follow-up with CT at 6-12 months is recommended to confirm persistence. If persistent, repeat CT is recommended every 2 years until 5 years of stability has been established. This recommendation follows the consensus statement: Guidelines for Management of Incidental Pulmonary Nodules Detected on CT Images: From the Fleischner Society 2017; Radiology 2017; 284:228-243. 5. Unchanged ascending aortic aneurysm measuring 4.0 cm. Recommend semi-annual imaging followup by CTA or MRA and referral to cardiothoracic surgery if not already obtained. This recommendation follows 2010 ACCF/AHA/AATS/ACR/ASA/SCA/SCAI/SIR/STS/SVM Guidelines for the Diagnosis and Management of  Patients With Thoracic Aortic Disease. Circulation. 2010; [id number]. Aortic aneurysm NOS (ICD10-I71.9) Aortic Atherosclerosis (ICD10-I70.0). Aortic aneurysm NOS (ICD10-I71.9). Electronically Signed: By: Zerita Boers M.D. On: 05/11/2019 15:25   Ct Cervical Spine Wo Contrast  Result Date: 05/11/2019 CLINICAL DATA:  Syncopal episode with a fall from standing. Head and neck pain. EXAM: CT HEAD WITHOUT CONTRAST CT CERVICAL SPINE WITHOUT CONTRAST TECHNIQUE: Multidetector CT imaging of the head and cervical spine was performed following the standard protocol without intravenous contrast. Multiplanar CT image reconstructions of the cervical spine were also generated. COMPARISON:  None. FINDINGS: CT HEAD FINDINGS Brain: No evidence of acute infarction, hemorrhage, hydrocephalus, extra-axial collection or mass lesion/mass effect. There is mild cerebral volume loss with associated ex vacuo dilatation. Periventricular white matter hypoattenuation likely represents chronic small vessel ischemic disease. Vascular: There are vascular calcifications in the carotid siphons. Skull: Normal. Negative for fracture or focal lesion. Sinuses/Orbits: There is bilateral maxillary, bilateral ethmoid, and right frontal sinus disease. Other: None. CT CERVICAL SPINE FINDINGS Alignment: Normal. Skull base and vertebrae: No acute fracture. No primary bone lesion or focal pathologic process. Soft tissues and spinal canal: No prevertebral fluid or swelling. No visible canal hematoma. Disc levels: Multilevel degenerative disc and joint disease results in up to severe neuroforaminal stenosis and moderate to severe central canal stenosis. Upper chest: A left pneumothorax is partially imaged. 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 IMPRESSION: 1. No acute intracranial process. 2. No acute osseous injury in the cervical spine. 3. Partially imaged left pneumothorax. Electronically Signed    By: Zerita Boers M.D.   On: 05/11/2019 15:07    Review of Systems  Constitutional: Negative for chills, fever and malaise/fatigue.  HENT: Negative for ear discharge, hearing loss and sore throat.   Eyes: Negative for blurred vision and discharge.  Respiratory: Negative for cough and shortness of breath (min).   Cardiovascular: Negative for  chest pain, orthopnea and leg swelling.  Gastrointestinal: Negative for abdominal pain, constipation, diarrhea, heartburn, nausea and vomiting.  Musculoskeletal: Negative for myalgias and neck pain.  Skin: Negative for itching and rash.  Neurological: Negative for dizziness, focal weakness, seizures and loss of consciousness.  Endo/Heme/Allergies: Negative for environmental allergies. Does not bruise/bleed easily.  Psychiatric/Behavioral: Negative for depression and suicidal ideas.  All other systems reviewed and are negative.  Blood pressure 132/81, pulse 60, temperature 97.8 F (36.6 C), temperature source Oral, resp. rate 16, SpO2 97 %. Physical Exam  Constitutional: He is oriented to person, place, and time. Vital signs are normal. He appears well-developed and well-nourished.  Conversant No acute distress  HENT:  Head: Normocephalic and atraumatic.  Eyes: Pupils are equal, round, and reactive to light. Conjunctivae and lids are normal. No scleral icterus.  No lid lag Moist conjunctiva  Neck: Normal range of motion. Neck supple. No tracheal tenderness present. No thyromegaly present.  No cervical lymphadenopathy  Cardiovascular: Normal rate, regular rhythm and intact distal pulses.  No murmur heard. Pacer  Respiratory: Effort normal and breath sounds normal. He has no wheezes. He has no rales. He exhibits tenderness (L side).  GI: Soft. Bowel sounds are normal. There is no hepatosplenomegaly. There is no abdominal tenderness. There is no rebound and no guarding. No hernia.  Neurological: He is alert and oriented to person, place, and time.   Normal gait and station  Skin: Skin is warm. No rash noted. No cyanosis. Nails show no clubbing.  Normal skin turgor  Psychiatric: Judgment normal.  Appropriate affect    Assessment/Plan: 84 year old male status post fall Left 6 through 10 rib fractures Left 3 and 8 TP fracture Left hemopneumothorax SVT Hyperlipidemia Diabetes Cardiomyopathy  1.  Patient to be admitted to medicine team for history of syncope, SVT, and other medical comorbidities 2.  Pain control for left-sided rib fractures, pulmonary toilet, nasal cannula if needed however patient is satting well currently. 3.  We will continue to follow.  Ralene Ok 05/11/2019, 4:50 PM

## 2019-05-11 NOTE — ED Provider Notes (Signed)
[facility] EMERGENCY DEPARTMENT Provider Note   CSN: [id number] Arrival date & time: 05/11/19  1204     History   Chief Complaint Chief Complaint  Patient presents with   Loss of Consciousness   Chest Pain    HPI Steven Macdonald is a 84 y.o. male.     The history is provided by the patient and medical records. No language interpreter was used.  Loss of Consciousness Episode history:  Single Most recent episode:  Yesterday Duration: several. Timing:  Rare Progression:  Resolved Chronicity:  New Witnessed: yes   Relieved by:  Nothing Worsened by:  Nothing Associated symptoms: chest pain (after) and shortness of breath   Associated symptoms: no diaphoresis, no fever, no focal weakness, no headaches, no malaise/fatigue, no nausea, no palpitations and no vomiting     Past Medical History:  Diagnosis Date   Arthritis    Atrial flutter ([place])    RFCA    BPH (benign prostatic hypertrophy)    Cardiomyopathy, nonischemic -resolved    Echo 2008: EF 25% Cardiac CT 2009: Normal cors EF 31% Echo 3/10 EF 45% Echo 6/13: EF 50-55%     CHF (congestive heart failure) (HCC)    Chicken pox    Colon polyps    hyperplastic   Diabetes mellitus ([place])    ED (erectile dysfunction)    Gilbert's syndrome    possible   Hemorrhoids    Herniated cervical disc    HLD (hyperlipidemia)    HTN (hypertension)    LBBB (left bundle branch block)    Orthostatic hypotension    Pacemaker  Medtronic    DOI 2/06   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[place])    Pulmonary embolism (HCC)    Sinus node dysfunction ([place])         Patient Active Problem List   Diagnosis Date Noted   Acute left-sided low back pain without sciatica 06/04/2017   Precordial chest pain 07/17/2013   Pulmonary embolism ([place]) 10/17/2012   Nonischemic cardiomyopathy ([place]) 06/04/2012   Pacemaker-Medtronic 04/11/2011   HYPERTENSION, BENIGN 05/24/2009   LBBB 10/06/2008   SICK SINUS/  TACHY-BRADY SYNDROME 10/06/2008   HYPERLIPIDEMIA 08/27/2008   CARDIOMYOPATHY--resolved 08/27/2008    Past Surgical History:  Procedure Laterality Date   CARDIOVERSION     coronary ablation     CYSTOSCOPY W/ TRANSURETHRAL RESECTION OF POSTERIOR URETHERAL VALVES     PACEMAKER GENERATOR CHANGE N/A 05/13/2014   Procedure: PACEMAKER GENERATOR CHANGE;  Surgeon: Deboraha Sprang, MD;  Location: [facility] CATH LAB;  Service: Cardiovascular;  Laterality: N/A;   PACEMAKER INSERTION          Home Medications    Prior to Admission medications   Medication Sig Start Date End Date Taking? Authorizing Provider  Calcium-Magnesium-Vitamin D (CALCIUM 500 PO) Take 1 tablet by mouth daily.     [provider]  cholecalciferol (VITAMIN D) 1000 UNITS tablet Take 1,000 Units by mouth daily.    [provider]  finasteride (PROSCAR) 5 MG tablet Take 5 mg by mouth.    [provider]  fish oil-omega-3 fatty acids 1000 MG capsule Take 1 tablet by mouth daily    [provider]  lisinopril (PRINIVIL,ZESTRIL) 5 MG tablet Take 5 mg by mouth daily.     [provider]  mirtazapine (REMERON) 15 MG tablet Take 15 mg by mouth at bedtime.    [provider]  Probiotic Product (ALIGN PO) Take by mouth.    [provider]  Psyllium (METAMUCIL MULTIHEALTH FIBER PO) Take 1 Applicatorful by mouth daily.     [provider]  sildenafil (VIAGRA) 100 MG tablet Take 100 mg by mouth daily as needed for erectile dysfunction.     [provider]  simvastatin (ZOCOR) 40 MG tablet Take 20 mg by mouth 2 (two) times daily.     [provider]  tamsulosin (FLOMAX) 0.4 MG CAPS capsule Take 0.4 mg by mouth.    [provider]  warfarin (COUMADIN) 7.5 MG tablet Take 5-7.5 mg by mouth daily. Except on Monday, Wednesday and Friday take 5 mg    [provider]    Family History Family History  Problem Relation Age of Onset    Breast cancer Mother    Colon polyps Father        pre-cancerous   Bladder Cancer Father     Social History Social History   Tobacco Use   Smoking status: Former Smoker    Quit date: 10/17/1970    Years since quitting: 48.5   Smokeless tobacco: Never Used  Substance Use Topics   Alcohol use: Yes    Comment: 2 glasses wine daily   Drug use: No     Allergies   Patient has no known allergies.   Review of Systems Review of Systems  Constitutional: Negative for chills, diaphoresis, fatigue, fever and malaise/fatigue.  HENT: Negative for congestion.   Eyes: Negative for visual disturbance.  Respiratory: Positive for shortness of breath. Negative for cough, chest tightness and wheezing.   Cardiovascular: Positive for chest pain (after) and syncope. Negative for palpitations and leg swelling.  Gastrointestinal: Negative for abdominal pain, constipation, diarrhea, nausea and vomiting.  Genitourinary: Negative for flank pain.  Musculoskeletal: Negative for back pain, neck pain and neck stiffness.  Skin: Negative for rash and wound.  Neurological: Positive for syncope. Negative for focal weakness and headaches.  Psychiatric/Behavioral: Negative for agitation.  All other systems reviewed and are negative.    Physical Exam Updated Vital Signs BP 132/82 (BP Location: Left Arm)    Pulse 60    Temp 97.8 F (36.6 C) (Oral)    Resp 16    SpO2 99%   Physical Exam Vitals signs and nursing note reviewed.  Constitutional:      General: He is not in acute distress.    Appearance: He is well-developed. He is not ill-appearing, toxic-appearing or diaphoretic.  HENT:     Head: Normocephalic and atraumatic.  Eyes:     Conjunctiva/sclera: Conjunctivae normal.     Pupils: Pupils are equal, round, and reactive to light.  Neck:     Musculoskeletal: Neck supple.  Cardiovascular:     Rate and Rhythm: Normal rate and regular rhythm.     Heart sounds: No murmur.  Pulmonary:      Effort: Pulmonary effort is normal. No respiratory distress.     Breath sounds: Rhonchi present. No decreased breath sounds, wheezing or rales.  Chest:     Chest wall: Tenderness present.  Abdominal:     Palpations: Abdomen is soft.     Tenderness: There is no abdominal tenderness.  Musculoskeletal:     Right lower leg: He exhibits no tenderness. No edema.     Left lower leg: He exhibits no tenderness. No edema.  Skin:    General: Skin is warm and dry.     Capillary Refill: Capillary refill takes less than 2 seconds.  Neurological:     General: No focal deficit present.  Mental Status: He is alert.  Psychiatric:        Mood and Affect: Mood normal.      ED Treatments / Results  Labs (all labs ordered are listed, but only abnormal results are displayed) Labs Reviewed  BASIC METABOLIC PANEL - Abnormal; Notable for the following components:      Result Value   Glucose, Bld 100 (*)    All other components within normal limits  CBC - Abnormal; Notable for the following components:   Platelets 122 (*)    All other components within normal limits  HEPATIC FUNCTION PANEL - Abnormal; Notable for the following components:   Total Bilirubin 1.8 (*)    Indirect Bilirubin 1.6 (*)    All other components within normal limits  PROTIME-INR - Abnormal; Notable for the following components:   Prothrombin Time 29.3 (*)    INR 2.8 (*)    All other components within normal limits  SARS CORONAVIRUS 2 (TAT 6-24 HRS)  BRAIN NATRIURETIC PEPTIDE  MAGNESIUM  TROPONIN I (HIGH SENSITIVITY)  TROPONIN I (HIGH SENSITIVITY)    EKG EKG Interpretation  Date/Time:  Sunday May 11 2019 12:09:32 EST Ventricular Rate:  61 PR Interval:  184 QRS Duration: 160 QT Interval:  444 QTC Calculation: 446 R Axis:   -80 Text Interpretation: Normal sinus rhythm Left axis deviation Left bundle branch block Abnormal ECG When compared to prior, similar paced rhythm No STEMI Confirmed by Antony Blackbird  [phone]) on 05/11/2019 1:15:46 PM   Radiology Dg Chest 2 View  Result Date: 05/11/2019 CLINICAL DATA:  Pt had syncopal episode yesterday. States he fell and hit back. Reports + LOC for a few seconds. C/o L sided chest pain since fall, mild sob. Denies nausea and vomiting. EXAM: CHEST - 2 VIEW COMPARISON:  None. FINDINGS: Mildly displaced fracture of the lateral left eighth rib. No other visualized fractures. No pneumothorax or pleural effusion. There is linear opacity at the lung bases, greater on the left, consistent with atelectasis. Remainder of the lungs is clear. Cardiac silhouette is mildly enlarged. No mediastinal or hilar masses. Stable right anterior chest wall pacemaker. IMPRESSION: 1. Mildly displaced fracture of the lateral left eighth rib. No pneumothorax. 2. Mild lung base atelectasis, left greater than right. Electronically Signed   By: Lajean Manes M.D.   On: 05/11/2019 13:11   Ct Head Wo Contrast  Result Date: 05/11/2019 CLINICAL DATA:  Syncopal episode with a fall from standing. Head and neck pain. EXAM: CT HEAD WITHOUT CONTRAST CT CERVICAL SPINE WITHOUT CONTRAST TECHNIQUE: Multidetector CT imaging of the head and cervical spine was performed following the standard protocol without intravenous contrast. Multiplanar CT image reconstructions of the cervical spine were also generated. COMPARISON:  None. FINDINGS: CT HEAD FINDINGS Brain: No evidence of acute infarction, hemorrhage, hydrocephalus, extra-axial collection or mass lesion/mass effect. There is mild cerebral volume loss with associated ex vacuo dilatation. Periventricular white matter hypoattenuation likely represents chronic small vessel ischemic disease. Vascular: There are vascular calcifications in the carotid siphons. Skull: Normal. Negative for fracture or focal lesion. Sinuses/Orbits: There is bilateral maxillary, bilateral ethmoid, and right frontal sinus disease. Other: None. CT CERVICAL SPINE FINDINGS Alignment: Normal.  Skull base and vertebrae: No acute fracture. No primary bone lesion or focal pathologic process. Soft tissues and spinal canal: No prevertebral fluid or swelling. No visible canal hematoma. Disc levels: Multilevel degenerative disc and joint disease results in up to severe neuroforaminal stenosis and moderate to severe central canal stenosis. Upper chest: A  left pneumothorax is partially imaged. 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 IMPRESSION: 1. No acute intracranial process. 2. No acute osseous injury in the cervical spine. 3. Partially imaged left pneumothorax. Electronically Signed   By: Zerita Boers M.D.   On: 05/11/2019 15:07   Ct Chest W Contrast  Addendum Date: 05/11/2019   ADDENDUM REPORT: 05/11/2019 15:51 ADDENDUM: In the FINDINGS section, the report should note that the left eighth rib fracture is displaced and overriding. Critical Value/emergent results were called by telephone at the time of interpretation on 05/11/2019 at 3:49 pm to [place] , who verbally acknowledged these results. Electronically Signed   By: Zerita Boers M.D.   On: 05/11/2019 15:51   Result Date: 05/11/2019 CLINICAL DATA:  Syncopal episode with a fall. EXAM: CT CHEST WITH CONTRAST TECHNIQUE: Multidetector CT imaging of the chest was performed during intravenous contrast administration. CONTRAST:  45mL OMNIPAQUE IOHEXOL 300 MG/ML  SOLN COMPARISON:  Chest radiograph from the same day and CT chest dated 07/26/2017. FINDINGS: Cardiovascular: The ascending aorta measures 4.0 cm in diameter, not significantly changed since 11/25/2018 vascular calcifications are seen in the aortic arch. The heart is enlarged. No pericardial effusion. A right chest cardiac device has leads terminating in the right atrium and right ventricle. Mediastinum/Nodes: No enlarged mediastinal, hilar, or axillary lymph nodes. Thyroid gland, trachea, and esophagus demonstrate no  significant findings. Lungs/Pleura: There is a small left hemopneumothorax. Ground-glass opacities and consolidation in the left lower lung likely represent contusion. Ground-glass opacities in the right upper lobe may represent contusion. There is mild dependent atelectasis in the right lower lobe. There is no right pleural effusion or pneumothorax. Upper Abdomen: No acute abnormality. Musculoskeletal: There fractures of the left third and 8 transverse processes. There fractures of the posteromedial left sixth, seventh, and tenth ribs as well as the lateral seventh through ninth ribs. IMPRESSION: 1. Fractures of left sixth through tenth ribs as well as the left third and 8 transverse processes. The left seventh rib is a segmental fracture. 2. Small left hemopneumothorax. 3. Ground-glass opacities and consolidation in the left lower lung likely represent contusion. 4. Ground-glass opacities in the right upper lobe may represent contusion. Initial follow-up with CT at 6-12 months is recommended to confirm persistence. If persistent, repeat CT is recommended every 2 years until 5 years of stability has been established. This recommendation follows the consensus statement: Guidelines for Management of Incidental Pulmonary Nodules Detected on CT Images: From the Fleischner Society 2017; Radiology 2017; 284:228-243. 5. Unchanged ascending aortic aneurysm measuring 4.0 cm. Recommend semi-annual imaging followup by CTA or MRA and referral to cardiothoracic surgery if not already obtained. This recommendation follows 2010 ACCF/AHA/AATS/ACR/ASA/SCA/SCAI/SIR/STS/SVM Guidelines for the Diagnosis and Management of Patients With Thoracic Aortic Disease. Circulation. 2010; [id number]. Aortic aneurysm NOS (ICD10-I71.9) Aortic Atherosclerosis (ICD10-I70.0). Aortic aneurysm NOS (ICD10-I71.9). Electronically Signed: By: Zerita Boers M.D. On: 05/11/2019 15:25   Ct Cervical Spine Wo Contrast  Result Date: 05/11/2019 CLINICAL  DATA:  Syncopal episode with a fall from standing. Head and neck pain. EXAM: CT HEAD WITHOUT CONTRAST CT CERVICAL SPINE WITHOUT CONTRAST TECHNIQUE: Multidetector CT imaging of the head and cervical spine was performed following the standard protocol without intravenous contrast. Multiplanar CT image reconstructions of the cervical spine were also generated. COMPARISON:  None. FINDINGS: CT HEAD FINDINGS Brain: No evidence of acute infarction, hemorrhage, hydrocephalus, extra-axial collection or mass lesion/mass effect. There is mild cerebral volume loss with associated  ex vacuo dilatation. Periventricular white matter hypoattenuation likely represents chronic small vessel ischemic disease. Vascular: There are vascular calcifications in the carotid siphons. Skull: Normal. Negative for fracture or focal lesion. Sinuses/Orbits: There is bilateral maxillary, bilateral ethmoid, and right frontal sinus disease. Other: None. CT CERVICAL SPINE FINDINGS Alignment: Normal. Skull base and vertebrae: No acute fracture. No primary bone lesion or focal pathologic process. Soft tissues and spinal canal: No prevertebral fluid or swelling. No visible canal hematoma. Disc levels: Multilevel degenerative disc and joint disease results in up to severe neuroforaminal stenosis and moderate to severe central canal stenosis. Upper chest: A left pneumothorax is partially imaged. 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 IMPRESSION: 1. No acute intracranial process. 2. No acute osseous injury in the cervical spine. 3. Partially imaged left pneumothorax. Electronically Signed   By: Zerita Boers M.D.   On: 05/11/2019 15:07    Procedures Procedures (including critical care time)  Medications Ordered in ED Medications  sodium chloride flush (NS) 0.9 % injection 3 mL (3 mLs Intravenous Not Given 05/11/19 1345)  iohexol (OMNIPAQUE) 300 MG/ML solution 75 mL (75 mLs Intravenous Contrast  Given 05/11/19 1445)     Initial Impression / Assessment and Plan / ED Course  I have reviewed the triage vital signs and the nursing notes.  Pertinent labs & imaging results that were available during my care of the patient were reviewed by me and considered in my medical decision making (see chart for details).        Steven Macdonald is a 84 y.o. male with a past medical history significant for hypertension, hyperlipidemia, prior pulmonary embolism on Coumadin therapy and CHF with pacemaker dependence who with left-sided chest pain and shortness of breath after a syncopal episode.  Patient reports that he was in the kitchen last night and had no preceding symptoms before he had a syncopal episode.  He reports falling to the ground landing on his chest and was witnessed by his wife.  She reports he was unconscious for several seconds for waking up.  He has developed worsening left-sided chest pain and shortness of breath since the fall.  He denies any headache or neck pain.  He denies any other injuries.  He reports the pain is very pleuritic in nature.  He reports he feels very different than the pain he had with prior pulmonary embolism.  On exam, patient did not have significant bruising but does have chest tenderness.  He does have some rhonchi on the left chest compared to the right.  No murmur was appreciated.  Abdomen was nontender.  Good pulses in extremities.  No lower extremity edema was appreciated.  Patient had normal oxygen saturations on room air between 95 and 90%.  He was not tachycardic and appeared to be paced.  EKG showed paced rhythm with no STEMI.  X-ray in triage showed a single rib fracture however clinically I am concerned about more rib fractures given the fall.  I am also concerned about the syncopal episode.  Patient had screening labs as well as pacemaker interrogation.  [employer] called to report that patient had an episode of SVT during this episode.  Cardiology was  called who came to the patient and recommended admission for electrophysiology to see him in the morning to discuss further management.  They agreed with telemetry observation overnight.  CT scan was performed and revealed evidence of ribs 6 through 10 fractured on the left  with a hemopneumothorax.  One of them was segmental.  There is also 2 TP fractures.  Patient remains on room air but his shortness of breath has worsened.  Trauma was consulted for the multiple rib fractures and hemopneumothorax.  Trauma initially did not feel he would need a chest tube and due to the likely cardiac syncope causing his symptoms with electrophysiology consultation in the morning, they felt more comfortable with internal medicine being the primary admitting service with them in consultation.  We will call medicine for admission.    Final Clinical Impressions(s) / ED Diagnoses   Final diagnoses:  Syncope, unspecified syncope type  Closed fracture of multiple ribs of left side, initial encounter  Hemopneumothorax on left     Clinical Impression: 1. Syncope, unspecified syncope type   2. Closed fracture of multiple ribs of left side, initial encounter   3. Hemopneumothorax on left     Disposition: Admit  This note was prepared with assistance of Dragon voice recognition software. Occasional wrong-word or sound-a-like substitutions may have occurred due to the inherent limitations of voice recognition software.     Bosten Newstrom, Gwenyth Allegra, MD 05/11/19 [id number]

## 2019-05-11 NOTE — H&P (Signed)
History and Physical    Steven Macdonald [id number] DOB: 25-Oct-1934 DOA: 05/11/2019  PCP: Crist Infante, MD  Patient coming from: Home  I have personally briefly reviewed patient's old medical records in [place]  Chief Complaint: Chest pain with syncope last night around 8 PM  HPI: Steven Macdonald is a 84 y.o. 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  View of remote device checks from this year shows that he has known episodes of SVT that last 10 seconds or less.  He has been asymptomatic previously on these and is not on a beta-blocker.  Proximately 8 PM last night after dinner he and his wife are cleaning the kitchen.  He fell abruptly to the floor with no antecedent symptoms.  By the time the wife got him he was asymptomatic.  This occurred in a matter of seconds.  Since that time his back has been progressively hurting more.  He said he slept poorly last night and took some Tylenol and attempt to get some pain relief.  Due to pain in his back and ribs on his left side he came into the emergency department.  Chest rib x-ray showed a mildly displaced eighth rib fracture however CT scan showed fractures of ribs on the left 6, 7, 8, 9, 10, with a hemopneumothorax and associated pulmonary contusions.  He was seen in consultation by cardiology due to Medtronic pacer review showing that he had some SVT yesterday.  While patient was in the ER [employer] called to report that the patient had an episode of SVT during the episode last night.  Neurology has seen the patient and recommended he be admitted for observation for follow-up tomorrow morning by EP team.  With regards to his hemopneumothorax patient was  seen by Dr. Rosendo Gros there is did not feel that the patient needed a chest tube.  Recommended admission to medicine service for pulmonary toilet and as needed oxygen.  Both cardiology and trauma surgery will follow the patient while in the hospital.  Patient currently denies fevers, chills, nausea, vomiting, diarrhea, headache, blurry vision, double vision or dizziness.  He does complain of some constipation he usually takes MiraLAX at home and fiber but this has not worked recently.  Review of Systems: As per HPI otherwise all other systems reviewed and  negative.   Past Medical History:  Diagnosis Date   Arthritis    Atrial flutter ([place])    RFCA    BPH (benign prostatic hypertrophy)    Cardiomyopathy, nonischemic -resolved    Echo 2008: EF 25% Cardiac CT 2009: Normal cors EF 31% Echo 3/10 EF 45% Echo 6/13: EF 50-55%     CHF (congestive heart failure) (HCC)    Chicken pox    Colon polyps    hyperplastic   Diabetes mellitus ([place])    ED (erectile dysfunction)    Gilbert's syndrome    possible   Hemorrhoids    Herniated cervical disc    HLD (hyperlipidemia)    HTN (hypertension)    LBBB (left bundle branch block)    Orthostatic hypotension    Pacemaker  Medtronic    DOI 2/06   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[place])    Pulmonary embolism (HCC)    Sinus node  dysfunction [facility])         Past Surgical History:  Procedure Laterality Date   CARDIOVERSION     coronary ablation     CYSTOSCOPY W/ TRANSURETHRAL RESECTION OF POSTERIOR URETHERAL VALVES     PACEMAKER GENERATOR CHANGE N/A 05/13/2014   Procedure: PACEMAKER GENERATOR CHANGE;  Surgeon: Deboraha Sprang, MD;  Location: [facility] CATH LAB;  Service: Cardiovascular;  Laterality: N/A;   PACEMAKER INSERTION      Social History   Social History Narrative   Not on file  Patient is a retired Chief Executive Officer   reports that he quit smoking about 48 years ago. He has never used smokeless tobacco. He reports current alcohol use. He  reports that he does not use drugs.  No Known Allergies  Family History  Problem Relation Age of Onset   Breast cancer Mother    Colon polyps Father        pre-cancerous   Bladder Cancer Father     Prior to Admission medications   Medication Sig Start Date End Date Taking? Authorizing Provider  acetaminophen (TYLENOL) 500 MG tablet Take 1,000 mg by mouth every 6 (six) hours as needed (pain).   Yes [provider]  Calcium-Magnesium-Vitamin D (CALCIUM 500 PO) Take 1 tablet by mouth every morning.    Yes [provider]  cholecalciferol (VITAMIN D) 1000 UNITS tablet Take 1,000 Units by mouth every morning.    Yes [provider]  Cyanocobalamin (VITAMIN B-12 PO) Take 1 tablet by mouth every morning.   Yes [provider]  Docusate Sodium (COLACE PO) Take 1 capsule by mouth daily as needed (constipation).   Yes [provider]  finasteride (PROSCAR) 5 MG tablet Take 5 mg by mouth every morning.    Yes [provider]  fish oil-omega-3 fatty acids 1000 MG capsule Take 1 g by mouth every morning.    Yes [provider]  lisinopril (PRINIVIL,ZESTRIL) 5 MG tablet Take 5 mg by mouth every morning.    Yes [provider]  mirtazapine (REMERON) 15 MG tablet Take 15 mg by mouth at bedtime.   Yes [provider]  polyethylene glycol (MIRALAX / GLYCOLAX) 17 g packet Take 17 g by mouth daily as needed (constipation).   Yes [provider]  PRESCRIPTION MEDICATION Apply 1 application topically See admin instructions. Dermatological intment for wound care - apply to tailbone daily as needed for sores   Yes [provider]  Probiotic Product (ALIGN PO) Take 1 tablet by mouth every morning.    Yes [provider]  PSYLLIUM PO Take 10 mLs by mouth daily before breakfast. Mix in 8 oz liquid and drink   Yes [provider]  sildenafil (REVATIO) 20 MG tablet Take 100 mg by mouth daily as  needed (erectile dysfunction).   Yes [provider]  tamsulosin (FLOMAX) 0.4 MG CAPS capsule Take 0.4 mg by mouth every morning.    Yes [provider]  warfarin (COUMADIN) 5 MG tablet Take 7.5-10 mg by mouth See admin instructions. Take 2 tablets (10 mg) by mouth on Monday, Wednesday, Friday night, take 1 1 /2 tablets (7.5 mg) on Sunday, Tuesday, Thursday, Saturday night   Yes [provider]    Physical Exam:  Constitutional: NAD, calm, comfortable Vitals:   05/11/19 1600 05/11/19 1630 05/11/19 1700 05/11/19 1800  BP: (!) 143/81 132/81 138/87 119/85  Pulse: (!) 59 60 60 60  Resp:      Temp:  TempSrc:      SpO2: 97% 97% 97% 99%   Eyes: PERRL, lids and conjunctivae normal ENMT: Mucous membranes are moist. Posterior pharynx clear of any exudate or lesions.Normal dentition.  Neck: normal, supple, no masses, no thyromegaly Respiratory: Decreased breath sounds bilaterally, no wheezing, no crackles.  Poor respiratory effort. No accessory muscle use.  Cardiovascular: Regular rate and rhythm, no murmurs / rubs / gallops. No extremity edema. 2+ pedal pulses. No carotid bruits.  Abdomen: no tenderness, no masses palpated. No hepatosplenomegaly. Bowel sounds positive.  Musculoskeletal: no clubbing / cyanosis. No joint deformity upper and lower extremities. Good ROM, no contractures. Normal muscle tone.  Skin: no rashes, lesions, ulcers. No induration, with some bruising on the left flank that is tender to palpation and radiates to the back.  He has tenderness across the entire back of the chest Neurologic: CN 2-12 grossly intact. Sensation intact, DTR normal. Strength 5/5 in all 4.  Psychiatric: Normal judgment and insight. Alert and oriented x 3. Normal mood.    Labs on Admission: I have personally reviewed following labs and imaging studies  CBC: Recent Labs  Lab 05/11/19 1248  WBC 8.5  HGB 15.5  HCT 47.1  MCV 87.7  PLT [id number]*   Basic Metabolic  Panel: Recent Labs  Lab 05/11/19 1248 05/11/19 1356  NA 136  --   K 4.0  --   CL 103  --   CO2 26  --   GLUCOSE 100*  --   BUN 18  --   CREATININE 1.02  --   CALCIUM 9.9  --   MG  --  2.1   GFR: CrCl cannot be calculated (Unknown ideal weight.). Liver Function Tests: Recent Labs  Lab 05/11/19 1356  AST 27  ALT 20  ALKPHOS 54  BILITOT 1.8*  PROT 6.8  ALBUMIN 3.8   Coagulation Profile: Recent Labs  Lab 05/11/19 1356  INR 2.8*   Urine analysis:    Component Value Date/Time   COLORURINE STRAW (A) 03/28/2017 0045   APPEARANCEUR CLEAR 03/28/2017 0045   LABSPEC 1.004 (L) 03/28/2017 0045   PHURINE 6.0 03/28/2017 0045   GLUCOSEU NEGATIVE 03/28/2017 0045   HGBUR NEGATIVE 03/28/2017 0045   BILIRUBINUR NEGATIVE 03/28/2017 0045   KETONESUR NEGATIVE 03/28/2017 0045   PROTEINUR NEGATIVE 03/28/2017 0045   UROBILINOGEN 0.2 12/25/2006 0236   NITRITE NEGATIVE 03/28/2017 0045   LEUKOCYTESUR NEGATIVE 03/28/2017 0045    Radiological Exams on Admission: Dg Chest 2 View  Result Date: 05/11/2019 CLINICAL DATA:  Pt had syncopal episode yesterday. States he fell and hit back. Reports + LOC for a few seconds. C/o L sided chest pain since fall, mild sob. Denies nausea and vomiting. EXAM: CHEST - 2 VIEW COMPARISON:  None. FINDINGS: Mildly displaced fracture of the lateral left eighth rib. No other visualized fractures. No pneumothorax or pleural effusion. There is linear opacity at the lung bases, greater on the left, consistent with atelectasis. Remainder of the lungs is clear. Cardiac silhouette is mildly enlarged. No mediastinal or hilar masses. Stable right anterior chest wall pacemaker. IMPRESSION: 1. Mildly displaced fracture of the lateral left eighth rib. No pneumothorax. 2. Mild lung base atelectasis, left greater than right. Electronically Signed   By: Lajean Manes M.D.   On: 05/11/2019 13:11   Ct Head Wo Contrast  Result Date: 05/11/2019 CLINICAL DATA:  Syncopal episode with  a fall from standing. Head and neck pain. EXAM: CT HEAD WITHOUT CONTRAST CT CERVICAL SPINE WITHOUT CONTRAST TECHNIQUE: Multidetector  CT imaging of the head and cervical spine was performed following the standard protocol without intravenous contrast. Multiplanar CT image reconstructions of the cervical spine were also generated. COMPARISON:  None. FINDINGS: CT HEAD FINDINGS Brain: No evidence of acute infarction, hemorrhage, hydrocephalus, extra-axial collection or mass lesion/mass effect. There is mild cerebral volume loss with associated ex vacuo dilatation. Periventricular white matter hypoattenuation likely represents chronic small vessel ischemic disease. Vascular: There are vascular calcifications in the carotid siphons. Skull: Normal. Negative for fracture or focal lesion. Sinuses/Orbits: There is bilateral maxillary, bilateral ethmoid, and right frontal sinus disease. Other: None. CT CERVICAL SPINE FINDINGS Alignment: Normal. Skull base and vertebrae: No acute fracture. No primary bone lesion or focal pathologic process. Soft tissues and spinal canal: No prevertebral fluid or swelling. No visible canal hematoma. Disc levels: Multilevel degenerative disc and joint disease results in up to severe neuroforaminal stenosis and moderate to severe central canal stenosis. Upper chest: A left pneumothorax is partially imaged. 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 IMPRESSION: 1. No acute intracranial process. 2. No acute osseous injury in the cervical spine. 3. Partially imaged left pneumothorax. Electronically Signed   By: Zerita Boers M.D.   On: 05/11/2019 15:07   Ct Chest W Contrast  Addendum Date: 05/11/2019   ADDENDUM REPORT: 05/11/2019 15:51 ADDENDUM: In the FINDINGS section, the report should note that the left eighth rib fracture is displaced and overriding. Critical Value/emergent results were called by telephone at the time of interpretation on  05/11/2019 at 3:49 pm to [place] , who verbally acknowledged these results. Electronically Signed   By: Zerita Boers M.D.   On: 05/11/2019 15:51   Result Date: 05/11/2019 CLINICAL DATA:  Syncopal episode with a fall. EXAM: CT CHEST WITH CONTRAST TECHNIQUE: Multidetector CT imaging of the chest was performed during intravenous contrast administration. CONTRAST:  59mL OMNIPAQUE IOHEXOL 300 MG/ML  SOLN COMPARISON:  Chest radiograph from the same day and CT chest dated 07/26/2017. FINDINGS: Cardiovascular: The ascending aorta measures 4.0 cm in diameter, not significantly changed since 11/25/2018 vascular calcifications are seen in the aortic arch. The heart is enlarged. No pericardial effusion. A right chest cardiac device has leads terminating in the right atrium and right ventricle. Mediastinum/Nodes: No enlarged mediastinal, hilar, or axillary lymph nodes. Thyroid gland, trachea, and esophagus demonstrate no significant findings. Lungs/Pleura: There is a small left hemopneumothorax. Ground-glass opacities and consolidation in the left lower lung likely represent contusion. Ground-glass opacities in the right upper lobe may represent contusion. There is mild dependent atelectasis in the right lower lobe. There is no right pleural effusion or pneumothorax. Upper Abdomen: No acute abnormality. Musculoskeletal: There fractures of the left third and 8 transverse processes. There fractures of the posteromedial left sixth, seventh, and tenth ribs as well as the lateral seventh through ninth ribs. IMPRESSION: 1. Fractures of left sixth through tenth ribs as well as the left third and 8 transverse processes. The left seventh rib is a segmental fracture. 2. Small left hemopneumothorax. 3. Ground-glass opacities and consolidation in the left lower lung likely represent contusion. 4. Ground-glass opacities in the right upper lobe may represent contusion. Initial follow-up with CT at 6-12 months is  recommended to confirm persistence. If persistent, repeat CT is recommended every 2 years until 5 years of stability has been established. This recommendation follows the consensus statement: Guidelines for Management of Incidental Pulmonary Nodules Detected on CT Images: From the Fleischner Society 2017; Radiology 2017;  [id number]. 5. Unchanged ascending aortic aneurysm measuring 4.0 cm. Recommend semi-annual imaging followup by CTA or MRA and referral to cardiothoracic surgery if not already obtained. This recommendation follows 2010 ACCF/AHA/AATS/ACR/ASA/SCA/SCAI/SIR/STS/SVM Guidelines for the Diagnosis and Management of Patients With Thoracic Aortic Disease. Circulation. 2010; [id number]. Aortic aneurysm NOS (ICD10-I71.9) Aortic Atherosclerosis (ICD10-I70.0). Aortic aneurysm NOS (ICD10-I71.9). Electronically Signed: By: Zerita Boers M.D. On: 05/11/2019 15:25   Ct Cervical Spine Wo Contrast  Result Date: 05/11/2019 CLINICAL DATA:  Syncopal episode with a fall from standing. Head and neck pain. EXAM: CT HEAD WITHOUT CONTRAST CT CERVICAL SPINE WITHOUT CONTRAST TECHNIQUE: Multidetector CT imaging of the head and cervical spine was performed following the standard protocol without intravenous contrast. Multiplanar CT image reconstructions of the cervical spine were also generated. COMPARISON:  None. FINDINGS: CT HEAD FINDINGS Brain: No evidence of acute infarction, hemorrhage, hydrocephalus, extra-axial collection or mass lesion/mass effect. There is mild cerebral volume loss with associated ex vacuo dilatation. Periventricular white matter hypoattenuation likely represents chronic small vessel ischemic disease. Vascular: There are vascular calcifications in the carotid siphons. Skull: Normal. Negative for fracture or focal lesion. Sinuses/Orbits: There is bilateral maxillary, bilateral ethmoid, and right frontal sinus disease. Other: None. CT CERVICAL SPINE FINDINGS Alignment: Normal. Skull base and  vertebrae: No acute fracture. No primary bone lesion or focal pathologic process. Soft tissues and spinal canal: No prevertebral fluid or swelling. No visible canal hematoma. Disc levels: Multilevel degenerative disc and joint disease results in up to severe neuroforaminal stenosis and moderate to severe central canal stenosis. Upper chest: A left pneumothorax is partially imaged. 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 IMPRESSION: 1. No acute intracranial process. 2. No acute osseous injury in the cervical spine. 3. Partially imaged left pneumothorax. Electronically Signed   By: Zerita Boers M.D.   On: 05/11/2019 15:07    EKG: Independently reviewed by me personally. Normal sinus rhythm Left axis deviation Left bundle branch block Abnormal ECG When compared to prior from September 10, 2017, similar paced rhythm No STEMI  Assessment/Plan Principal Problem:   SVT (supraventricular tachycardia) (HCC) Active Problems:   Syncope due to sick sinus syndrome ([place])   Hemothorax on left   Pneumothorax, closed, traumatic, initial encounter   Multiple closed fractures of ribs of left side   Left pulmonary contusion   SICK SINUS/ TACHY-BRADY SYNDROME   Pacemaker-Medtronic   Nonischemic cardiomyopathy ([place])   1.  SVT associated with sick sinus syndrome, Medtronic pacemaker and episode of syncope: Cardiology has been consulted EP will see the patient in the morning.  We will repeat an EKG and monitor him overnight.  2.  Hemothorax with pneumothorax on the left: Surgery does not feel patient needs a chest tube we will continue to monitor repeat chest x-ray and hemoglobin in a.m.  3.  Multiple closed fractures of the ribs on the left side: Incentive spirometry: Flutter valve: Tylenol around-the-clock scheduled: Additional pain medicines as needed.  Encourage patient to get up and around as much as possible.  4.  Pulmonary contusion: Patient is  anticoagulated due to his atrial fibrillation.  Concerned that he may develop more bleeding will monitor closely.  Check hemoglobins in a.m.  5.  Nonischemic cardiomyopathy: Noted continue management per cardiology.  Coagulated on warfarin pharmacy to dose    DVT prophylaxis: Anticoagulated on warfarin Code Status: Full code Family Communication: Spoke with patient's wife who was present at the time of admission. Disposition Plan: Likely home  in 24 to 48 hours Consults called: Cardiology, Dr. Nolon Lennert: Trauma surgery Dr. Rosendo Gros Admission status: It is my clinical opinion that referral for OBSERVATION is reasonable and necessary in this patient based on the above information provided. The aforementioned taken together are felt to place the patient at high risk for further clinical deterioration. However it is anticipated that the patient may be medically stable for discharge from the hospital within 24 to 48 hours. It is my clinical opinion that referral for OBSERVATION is reasonable and necessary in this patient based on the above information provided. The aforementioned taken together are felt to place the patient at high risk for further clinical deterioration. However it is anticipated that the patient may be medically stable for discharge from the hospital within 24 to 48 hours.    Lady Deutscher MD FACP [facility] Pager [phone]  How to contact the [facility] Attending or Consulting provider [place] or covering provider during after hours [place], for this patient?  1. Check the care team in [facility] and look for a) attending/consulting [facility] provider listed and b) the [facility] team listed 2. Log into [email or web address] and use 's universal password to access. If you do not have the password, please contact the hospital operator. 3. Locate the [facility] provider you are looking for under [facility] and page to a number that you can be directly reached. 4. If you still have difficulty  reaching the provider, please page the [facility] (Director on Call) for the Hospitalists listed on amion for assistance.  If 7PM-7AM, please contact night-coverage [email or web address] Password [facility]  05/11/2019, 6:34 PM

## 2019-05-12 ENCOUNTER — Observation Stay (HOSPITAL_COMMUNITY): Payer: Medicare Other

## 2019-05-12 ENCOUNTER — Ambulatory Visit (HOSPITAL_COMMUNITY): Payer: Medicare Other

## 2019-05-12 DIAGNOSIS — Y92000 Kitchen of unspecified non-institutional (private) residence as  the place of occurrence of the external cause: Secondary | ICD-10-CM | POA: Diagnosis not present

## 2019-05-12 DIAGNOSIS — Z79899 Other long term (current) drug therapy: Secondary | ICD-10-CM | POA: Diagnosis not present

## 2019-05-12 DIAGNOSIS — Z86711 Personal history of pulmonary embolism: Secondary | ICD-10-CM | POA: Diagnosis not present

## 2019-05-12 DIAGNOSIS — R55 Syncope and collapse: Secondary | ICD-10-CM | POA: Diagnosis present

## 2019-05-12 DIAGNOSIS — I4891 Unspecified atrial fibrillation: Secondary | ICD-10-CM | POA: Diagnosis present

## 2019-05-12 DIAGNOSIS — Z87891 Personal history of nicotine dependence: Secondary | ICD-10-CM | POA: Diagnosis not present

## 2019-05-12 DIAGNOSIS — N400 Enlarged prostate without lower urinary tract symptoms: Secondary | ICD-10-CM | POA: Diagnosis not present

## 2019-05-12 DIAGNOSIS — W1830XA Fall on same level, unspecified, initial encounter: Secondary | ICD-10-CM | POA: Diagnosis present

## 2019-05-12 DIAGNOSIS — I447 Left bundle-branch block, unspecified: Secondary | ICD-10-CM | POA: Diagnosis present

## 2019-05-12 DIAGNOSIS — S22039A Unspecified fracture of third thoracic vertebra, initial encounter for closed fracture: Secondary | ICD-10-CM | POA: Diagnosis not present

## 2019-05-12 DIAGNOSIS — S27321A Contusion of lung, unilateral, initial encounter: Secondary | ICD-10-CM | POA: Diagnosis not present

## 2019-05-12 DIAGNOSIS — Z20828 Contact with and (suspected) exposure to other viral communicable diseases: Secondary | ICD-10-CM | POA: Diagnosis not present

## 2019-05-12 DIAGNOSIS — E785 Hyperlipidemia, unspecified: Secondary | ICD-10-CM | POA: Diagnosis present

## 2019-05-12 DIAGNOSIS — I951 Orthostatic hypotension: Secondary | ICD-10-CM | POA: Diagnosis not present

## 2019-05-12 DIAGNOSIS — K5900 Constipation, unspecified: Secondary | ICD-10-CM | POA: Diagnosis present

## 2019-05-12 DIAGNOSIS — J9811 Atelectasis: Secondary | ICD-10-CM | POA: Diagnosis not present

## 2019-05-12 DIAGNOSIS — I361 Nonrheumatic tricuspid (valve) insufficiency: Secondary | ICD-10-CM

## 2019-05-12 DIAGNOSIS — J942 Hemothorax: Secondary | ICD-10-CM | POA: Diagnosis not present

## 2019-05-12 DIAGNOSIS — J90 Pleural effusion, not elsewhere classified: Secondary | ICD-10-CM | POA: Diagnosis not present

## 2019-05-12 DIAGNOSIS — Z7901 Long term (current) use of anticoagulants: Secondary | ICD-10-CM | POA: Diagnosis not present

## 2019-05-12 DIAGNOSIS — E119 Type 2 diabetes mellitus without complications: Secondary | ICD-10-CM | POA: Diagnosis not present

## 2019-05-12 DIAGNOSIS — S270XXA Traumatic pneumothorax, initial encounter: Secondary | ICD-10-CM | POA: Diagnosis not present

## 2019-05-12 DIAGNOSIS — Z950 Presence of cardiac pacemaker: Secondary | ICD-10-CM | POA: Diagnosis not present

## 2019-05-12 DIAGNOSIS — I495 Sick sinus syndrome: Secondary | ICD-10-CM | POA: Diagnosis not present

## 2019-05-12 DIAGNOSIS — I4892 Unspecified atrial flutter: Secondary | ICD-10-CM | POA: Diagnosis not present

## 2019-05-12 DIAGNOSIS — S2242XA Multiple fractures of ribs, left side, initial encounter for closed fracture: Secondary | ICD-10-CM | POA: Diagnosis not present

## 2019-05-12 DIAGNOSIS — I10 Essential (primary) hypertension: Secondary | ICD-10-CM | POA: Diagnosis not present

## 2019-05-12 DIAGNOSIS — I428 Other cardiomyopathies: Secondary | ICD-10-CM | POA: Diagnosis not present

## 2019-05-12 DIAGNOSIS — I471 Supraventricular tachycardia: Secondary | ICD-10-CM | POA: Diagnosis not present

## 2019-05-12 DIAGNOSIS — S272XXA Traumatic hemopneumothorax, initial encounter: Secondary | ICD-10-CM | POA: Diagnosis not present

## 2019-05-12 LAB — CBC
HCT: 43 % (ref 39.0–52.0)
Hemoglobin: 14.4 g/dL (ref 13.0–17.0)
MCH: 29.4 pg (ref 26.0–34.0)
MCHC: 33.5 g/dL (ref 30.0–36.0)
MCV: 87.9 fL (ref 80.0–100.0)
Platelets: UNDETERMINED 10*3/uL (ref 150–400)
RBC: 4.89 MIL/uL (ref 4.22–5.81)
RDW: 13.9 % (ref 11.5–15.5)
WBC: 7.1 10*3/uL (ref 4.0–10.5)
nRBC: 0 % (ref 0.0–0.2)

## 2019-05-12 LAB — BASIC METABOLIC PANEL
Anion gap: 6 (ref 5–15)
BUN: 13 mg/dL (ref 8–23)
CO2: 26 mmol/L (ref 22–32)
Calcium: 9.2 mg/dL (ref 8.9–10.3)
Chloride: 106 mmol/L (ref 98–111)
Creatinine, Ser: 0.99 mg/dL (ref 0.61–1.24)
GFR calc Af Amer: >60 mL/min (ref >60)
GFR calc non Af Amer: >60 mL/min (ref >60)
Glucose, Bld: 99 mg/dL (ref 70–99)
Potassium: 4 mmol/L (ref 3.5–5.1)
Sodium: 138 mmol/L (ref 135–145)

## 2019-05-12 LAB — PROTIME-INR
INR: 3.3 — ABNORMAL HIGH (ref 0.8–1.2)
Prothrombin Time: 33.9 seconds — ABNORMAL HIGH (ref 11.4–15.2)

## 2019-05-12 LAB — ECHOCARDIOGRAM COMPLETE
Height: 75 in
Weight: 2752 oz

## 2019-05-12 MED ORDER — WARFARIN SODIUM 5 MG PO TABS
5.0000 mg | ORAL_TABLET | Freq: Once | ORAL | Status: AC
  Administered 2019-05-12: 5 mg via ORAL
  Filled 2019-05-12: qty 1

## 2019-05-12 MED ORDER — METOPROLOL SUCCINATE ER 25 MG PO TB24: 25.0000 mg | ORAL_TABLET | Freq: Every day | ORAL | Status: DC

## 2019-05-12 MED ORDER — PERFLUTREN LIPID MICROSPHERE
1.0000 mL | INTRAVENOUS | Status: AC | PRN
  Administered 2019-05-12: 2 mL via INTRAVENOUS
  Filled 2019-05-12: qty 10

## 2019-05-12 MED ORDER — POLYETHYLENE GLYCOL 3350 17 G PO PACK
17.0000 g | PACK | Freq: Every day | ORAL | Status: DC
  Administered 2019-05-12 – 2019-05-13 (×2): 17 g via ORAL
  Filled 2019-05-12 (×3): qty 1

## 2019-05-12 MED ORDER — PSYLLIUM 95 % PO PACK
1.0000 | PACK | Freq: Every day | ORAL | Status: DC
  Administered 2019-05-12 – 2019-05-14 (×3): 1 via ORAL
  Filled 2019-05-12 (×3): qty 1

## 2019-05-12 NOTE — Consult Note (Addendum)
Cardiology Consultation:   Patient ID: [name] MRN: [id number]; DOB: 1935-06-06  Admit date: 05/11/2019 Date of Consult: 05/12/2019  Primary Care Provider: Crist Infante, MD  Primary Cardiologist/Primary Electrophysiologist:  Dr. Caryl Comes   Patient Profile:   Steven Macdonald is a 84 y.o. male with a hx of AFlutter (ablated remotely), PE remains on warfarin, DCM with recovered LVEF, LBBB, SVT, SSSx w/PPM, DM, HTN, HLD, who is being seen today for the evaluation of syncope, h/o SVT at the request of Dr. Johnsie Cancel.  History of Present Illness:   Mr. Manjarres Last saw Dr. Caryl Comes via tele-health in April 2020, at that visit doing well, he notes asymptomatic SVT via his device, no changes were made to his programming or medicines.  He was admitted to [facility] yesterday after a syncopal event at home.   He sought attention the following day though for progressive back pain  LABS K+ 4.0 > 4.0 Mag 2.1 BUN/Creat 13/0.99 HS Trop 14 > 15 BNP 57 WBC 7.2 H/H 14/43 Plts 122 INR 2.8 > 3.3  Updated echo is ordered/pending 2019 LVEF 50-55%  Device information MDT dual chamber PPM, implanted 206, gen change 2015 Carelink transmission 05/11/2019 Battery and lead measurements are good AP 85% VP 0.5% Since march 2020: He has had  21Fast AV events available EGMs are 1:1 SVT 19 NSVT events, available EGMs are 1:1 SVT 3 AT/AF events, these are true AFlutter events, longest 53minutes  05/10/2019 had an SVT @ 21:22 duration 20 seconds His longest was 31 minutes back in Oct 2019   The patient and his wife provide hx. He tells me abut every day he feels fleeting dizziness, some last a little longer, these seem to be associated with heavier activities like mowing the grass, or after his daily walk. He walks 4 mi every day, takes break for water at 2 miles then does the other 2. His wife mentions some times at the 2 mile water break he mentions he feels a little lightheaded and he looks pale with  these.  05/10/2019 evening at about 2000 he was in the kitchen with his wife, she noticed in the corner of her eye he seemed to make a funny movement/unusual bending motion and then he fell to the floor.  He woke quickly rubbing his head though they noted no bleeding and not sure if he hit his head or not. He had no warning, states he was fine one second and on the floor the next, there was no weakness, CP, palpitations before or after.  He has noticed that of late he feels less motivated to do his walks, yard work, activities, like he ha slowed some in the last year.  Just doesn't feel like doing much in general.    Heart Pathway Score:     Past Medical History:  Diagnosis Date   Arthritis    Atrial flutter ([place])    RFCA    BPH (benign prostatic hypertrophy)    Cardiomyopathy, nonischemic -resolved    Echo 2008: EF 25% Cardiac CT 2009: Normal cors EF 31% Echo 3/10 EF 45% Echo 6/13: EF 50-55%     CHF (congestive heart failure) (HCC)    Chicken pox    Colon polyps    hyperplastic   Diabetes mellitus ([place])    ED (erectile dysfunction)    Gilbert's syndrome    possible   Hemorrhoids    Herniated cervical disc    HLD (hyperlipidemia)    HTN (hypertension)  LBBB (left bundle branch block)    Orthostatic hypotension    Pacemaker  Medtronic    DOI 2/06   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 Authorizing Provider  acetaminophen  (TYLENOL) 500 MG tablet Take 1,000 mg by mouth every 6 (six) hours as needed (pain).   Yes [provider]  Calcium-Magnesium-Vitamin D (CALCIUM 500 PO) Take 1 tablet by mouth every morning.    Yes [provider]  cholecalciferol (VITAMIN D) 1000 UNITS tablet Take 1,000 Units by mouth every morning.    Yes [provider]  Cyanocobalamin (VITAMIN B-12 PO) Take 1 tablet by mouth every morning.   Yes [provider]  Docusate Sodium (COLACE PO) Take 1 capsule by mouth daily as needed (constipation).   Yes [provider]  finasteride (PROSCAR) 5 MG tablet Take 5 mg by mouth every morning.    Yes [provider]  fish oil-omega-3 fatty acids 1000 MG capsule Take 1 g by mouth every morning.    Yes [provider]  lisinopril (PRINIVIL,ZESTRIL) 5 MG tablet Take 5 mg by mouth every morning.    Yes [provider]  mirtazapine (REMERON) 15 MG tablet Take 15 mg by mouth at bedtime.   Yes [provider]  polyethylene glycol (MIRALAX / GLYCOLAX) 17 g packet Take 17 g by mouth daily as needed (constipation).   Yes [provider]  PRESCRIPTION MEDICATION Apply 1 application topically See admin instructions. Dermatological intment for wound care - apply to tailbone daily as needed for sores   Yes [provider]  Probiotic Product (ALIGN PO) Take 1 tablet by mouth every morning.    Yes [provider]  PSYLLIUM PO Take 10 mLs by mouth daily before breakfast. Mix in 8 oz liquid and drink   Yes [provider]  sildenafil (REVATIO) 20 MG tablet Take 100 mg by mouth daily as needed (erectile dysfunction).   Yes [provider]  tamsulosin (FLOMAX) 0.4 MG CAPS capsule Take 0.4 mg by mouth every morning.    Yes [provider]  warfarin (COUMADIN) 5 MG tablet Take 7.5-10 mg by mouth See admin instructions. Take 2 tablets (10 mg) by mouth on Monday, Wednesday, Friday night, take 1 1  /2 tablets (7.5 mg) on Sunday, Tuesday, Thursday, Saturday night   Yes [provider]    Inpatient Medications: Scheduled Meds:  acetaminophen  1,000 mg Oral Q6H   cholecalciferol  1,000 Units Oral q morning - 10a   finasteride  5 mg Oral q morning - 10a   lisinopril  5 mg Oral q morning - 10a   mirtazapine  15 mg Oral QHS   omega-3 acid ethyl esters  1 g Oral Daily   polyethylene glycol  17 g Oral Daily   psyllium  1 packet Oral Daily   senna-docusate  2 tablet Oral BID   sodium  chloride flush  3 mL Intravenous Once   sodium chloride flush  3 mL Intravenous Q12H   tamsulosin  0.4 mg Oral q morning - 10a   vitamin B-12  100 mcg Oral q morning - 10a   warfarin  5 mg Oral ONCE-1800   Warfarin - Pharmacist Dosing Inpatient   Does not apply q1800   Continuous Infusions:  sodium chloride     PRN Meds: sodium chloride, ketorolac, oxyCODONE, sodium chloride flush  Allergies:   No Known Allergies  Social History:   Social History   Socioeconomic History   Marital status: Married    Spouse name: Not on file   Number of children: 4   Years of education: Not on file   Highest education level: Not on file  Occupational History   Occupation: Insurance underwriter: SELF-EMPLOYED  Social Designer, fashion/clothing strain: Not on file   Food insecurity    Worry: Not on file    Inability: Not on file   Transportation needs    Medical: Not on file    Non-medical: Not on file  Tobacco Use   Smoking status: Former Smoker    Quit date: 10/17/1970    Years since quitting: 48.6   Smokeless tobacco: Never Used  Substance and Sexual Activity   Alcohol use: Yes    Comment: 2 glasses wine daily   Drug use: No   Sexual activity: Not on file  Lifestyle   Physical activity    Days per week: Not on file    Minutes per session: Not on file   Stress: Not on file  Relationships   Social connections    Talks on phone: Not on file    Gets together:  Not on file    Attends religious service: Not on file    Active member of club or organization: Not on file    Attends meetings of clubs or organizations: Not on file    Relationship status: Not on file   Intimate partner violence    Fear of current or ex partner: Not on file    Emotionally abused: Not on file    Physically abused: Not on file    Forced sexual activity: Not on file  Other Topics Concern   Not on file  Social History Narrative   Not on file    Family History:   Family History  Problem Relation Age of Onset   Breast cancer Mother    Colon polyps Father        pre-cancerous   Bladder Cancer Father      ROS:  Please see the history of present illness.  All other ROS reviewed and negative.     Physical Exam/Data:   Vitals:   05/11/19 2005 05/11/19 2028 05/12/19 0503 05/12/19 0854  BP: 114/66 128/86 110/66 134/78  Pulse: 60 61 60   Resp: 20 20 17    Temp:  97.7 F (36.5 C) 98 F (36.7 C)   TempSrc:  Oral Oral   SpO2: 98% 97% 96%   Weight:  78.4 kg 78 kg   Height:  6\' 3"  (1.905 m)      Intake/Output Summary (Last 24 hours) at 05/12/2019 1205 Last data filed at 05/12/2019 0800 Gross per 24 hour  Intake 600 ml  Output --  Net 600 ml   Last 3 Weights 05/12/2019 05/11/2019 09/30/2018  Weight (lbs) 172 lb 172 lb 12.8 oz 175 lb  Weight (kg) 78.019 kg  78.382 kg 79.379 kg     Body mass index is 21.5 kg/m.  General:  Well nourished, though thin, well developed, in no acute distress HEENT: normal Lymph: no adenopathy Neck: no JVD Endocrine:  No thryomegaly Vascular: No carotid bruits  Cardiac:  normal S1, S2; RRR; no murmurs, gallops or rubs Lungs:  Some fine crackles at the bases, no wheezing, rhonchi or rales  Abd: not examined Ext: no edema Musculoskeletal:  No deformities Skin: warm and dry  Neuro:  No gross focal abnormalities noted Psych:  Normal affect   EKG:  The EKG was personally reviewed and demonstrates:    APaced 61,  LBBB APaced 65, LBBB   Telemetry:  Telemetry was personally reviewed and demonstrates:   SR, ST, he has has rates low 100's here, difficult to see P waves with, but slow to SR and associated with much movement on his leads, suspect is ST  Relevant CV Studies:  09/12/2017: TTE Study Conclusions - Left ventricle: The cavity size was normal. Wall thickness was   increased in a pattern of moderate LVH. There was severe focal   basal hypertrophy of the septum. Systolic function was normal.   The estimated ejection fraction was in the range of 50% to 55%.   Incoordinate septal motion. Doppler parameters are consistent   with abnormal left ventricular relaxation (grade 1 diastolic   dysfunction). The E/e&' ratio is <8, suggesting normal LV filling   pressure. - Aortic valve: Trileaflet. Sclerosis without stenosis. There was   trivial regurgitation. - Ascending aorta: The ascending aorta was mildly dilated at 4.0   cm. - Mitral valve: Mildly thickened leaflets . There was trivial   regurgitation. - Left atrium: The atrium was normal in size. - Right ventricle: Pacer wire or catheter noted in right ventricle. - Right atrium: Pacer wire or catheter noted in right atrium. - Tricuspid valve: There was mild regurgitation. - Pulmonary arteries: PA peak pressure: 26 mm Hg (S). - Inferior vena cava: The vessel was normal in size. The   respirophasic diameter changes were in the normal range (>= 50%),   consistent with normal central venous pressure.  Impressions: - Compared to a prior study in 03/2015, the LVEF is higher at   50-55% with incoordinate septal motion - pacer wires are noted.   The ascending aorta is mildly dilated at 4.0 cm.  Laboratory Data:  High Sensitivity Troponin:   Recent Labs  Lab 05/11/19 1248 05/11/19 1513  TROPONINIHS 14 15     Chemistry Recent Labs  Lab 05/11/19 1248 05/12/19 0452  NA 136 138  K 4.0 4.0  CL 103 106  CO2 26 26  GLUCOSE 100* 99  BUN 18  13  CREATININE 1.02 0.99  CALCIUM 9.9 9.2  GFRNONAA >60 >60  GFRAA >60 >60  ANIONGAP 7 6    Recent Labs  Lab 05/11/19 1356  PROT 6.8  ALBUMIN 3.8  AST 27  ALT 20  ALKPHOS 54  BILITOT 1.8*   Hematology Recent Labs  Lab 05/11/19 1248 05/12/19 0452  WBC 8.5 7.1  RBC 5.37 4.89  HGB 15.5 14.4  HCT 47.1 43.0  MCV 87.7 87.9  MCH 28.9 29.4  MCHC 32.9 33.5  RDW 13.7 13.9  PLT 122* PLATELET CLUMPS NOTED ON SMEAR, UNABLE TO ESTIMATE   BNP Recent Labs  Lab 05/11/19 1513  BNP 57.0    DDimer No results for input(s): DDIMER in the last 168 hours.   Radiology/Studies:   X-ray Chest  Pa And Lateral Result Date: 05/12/2019 CLINICAL DATA:  Pneumothorax. EXAM: CHEST - 2 VIEW COMPARISON:  May 11, 2019. FINDINGS: The heart size and mediastinal contours are within normal limits. Right-sided pacemaker is unchanged in position. Minimal bibasilar subsegmental atelectasis is noted. No pneumothorax is noted. Small left pleural effusion may be present. Mildly displaced left eighth rib fracture is again noted. IMPRESSION: Minimal bibasilar subsegmental atelectasis. Small left pleural effusion may be present. Stable mildly displaced left eighth rib fracture is noted. Electronically Signed   By: Marijo Conception M.D.   On: 05/12/2019 08:00    Ct Head Wo Contrast Result Date: 05/11/2019 CLINICAL DATA:  Syncopal episode with a fall from standing. Head and neck pain. EXAM: CT HEAD WITHOUT CONTRAST CT CERVICAL SPINE WITHOUT CONTRAST TECHNIQUE: Multidetector CT imaging of the head and cervical spine was performed following the standard protocol without intravenous contrast. Multiplanar CT image reconstructions of the cervical spine were also generated. COMPARISON:  None. FINDINGS: CT HEAD FINDINGS Brain: No evidence of acute infarction, hemorrhage, hydrocephalus, extra-axial collection or mass lesion/mass effect. There is mild cerebral volume loss with associated ex vacuo dilatation. Periventricular  white matter hypoattenuation likely represents chronic small vessel ischemic disease. Vascular: There are vascular calcifications in the carotid siphons. Skull: Normal. Negative for fracture or focal lesion. Sinuses/Orbits: There is bilateral maxillary, bilateral ethmoid, and right frontal sinus disease. Other: None. CT CERVICAL SPINE FINDINGS Alignment: Normal. Skull base and vertebrae: No acute fracture. No primary bone lesion or focal pathologic process. Soft tissues and spinal canal: No prevertebral fluid or swelling. No visible canal hematoma. Disc levels: Multilevel degenerative disc and joint disease results in up to severe neuroforaminal stenosis and moderate to severe central canal stenosis. Upper chest: A left pneumothorax is partially imaged. 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 IMPRESSION: 1. No acute intracranial process. 2. No acute osseous injury in the cervical spine. 3. Partially imaged left pneumothorax. Electronically Signed   By: Zerita Boers M.D.   On: 05/11/2019 15:07    Ct Chest W Contrast Addendum Date: 05/11/2019   ADDENDUM REPORT: 05/11/2019 15:51 ADDENDUM: In the FINDINGS section, the report should note that the left eighth rib fracture is displaced and overriding. Critical Value/emergent results were called by telephone at the time of interpretation on 05/11/2019 at 3:49 pm to El[place] , who verbally acknowledged these results. Electronically Signed   By: Zerita Boers M.D.   On: 05/11/2019 15:51  Result Date: 05/11/2019 CLINICAL DATA:  Syncopal episode with a fall. EXAM: CT CHEST WITH CONTRAST TECHNIQUE: Multidetector CT imaging of the chest was performed during intravenous contrast administration. CONTRAST:  58mL OMNIPAQUE IOHEXOL 300 MG/ML  SOLN COMPARISON:  Chest radiograph from the same day and CT chest dated 07/26/2017. FINDINGS: Cardiovascular: The ascending aorta measures 4.0 cm in diameter, not  significantly changed since 11/25/2018 vascular calcifications are seen in the aortic arch. The heart is enlarged. No pericardial effusion. A right chest cardiac device has leads terminating in the right atrium and right ventricle. Mediastinum/Nodes: No enlarged mediastinal, hilar, or axillary lymph nodes. Thyroid gland, trachea, and esophagus demonstrate no significant findings. Lungs/Pleura: There is a small left hemopneumothorax. Ground-glass opacities and consolidation in the left lower lung likely represent contusion. Ground-glass opacities in the right upper lobe may represent contusion. There is mild dependent atelectasis in the right lower lobe. There is no right pleural effusion or pneumothorax. Upper Abdomen: No acute abnormality. Musculoskeletal: There fractures of the  left third and 8 transverse processes. There fractures of the posteromedial left sixth, seventh, and tenth ribs as well as the lateral seventh through ninth ribs. IMPRESSION: 1. Fractures of left sixth through tenth ribs as well as the left third and 8 transverse processes. The left seventh rib is a segmental fracture. 2. Small left hemopneumothorax. 3. Ground-glass opacities and consolidation in the left lower lung likely represent contusion. 4. Ground-glass opacities in the right upper lobe may represent contusion. Initial follow-up with CT at 6-12 months is recommended to confirm persistence. If persistent, repeat CT is recommended every 2 years until 5 years of stability has been established. This recommendation follows the consensus statement: Guidelines for Management of Incidental Pulmonary Nodules Detected on CT Images: From the Fleischner Society 2017; Radiology 2017; 284:228-243. 5. Unchanged ascending aortic aneurysm measuring 4.0 cm. Recommend semi-annual imaging followup by CTA or MRA and referral to cardiothoracic surgery if not already obtained. This recommendation follows 2010 ACCF/AHA/AATS/ACR/ASA/SCA/SCAI/SIR/STS/SVM  Guidelines for the Diagnosis and Management of Patients With Thoracic Aortic Disease. Circulation. 2010; [id number]. Aortic aneurysm NOS (ICD10-I71.9) Aortic Atherosclerosis (ICD10-I70.0). Aortic aneurysm NOS (ICD10-I71.9). Electronically Signed: By: Zerita Boers M.D. On: 05/11/2019 15:25       Assessment and Plan:   1. Syncope     Sudden on/off   2. SVT He has known brief SVT's, he did have an SVT the evening of his syncopal event though they report the timing is way off, at the time oft he SVT noted by his device, he was about time he was going to bed, this at least an hour after his syncope  I will ask MDT to do a programmer device check to make sure device time is correct (given recently had daylight savings time change)  He has some daily transient weak,lightheaded spells this does not line up with the SVT either, noting he has nowhere near daily SVTs  New echo is pending  3. He has orthostatic hypotension listed in his history     Syncope does not sound of orthostatic     He reports good water intake      Worth checking orthostatic vitals  4. Check carotids     No bruits are appreciated  5. PPM     Functioning as programmed, lead and battery measurements are good   6. Chest wall trauma     C/w medicine service, no need for surgical intervention by traum service consult     rec pulm toilet, pain management, O2 if neeed    Unclear etiology of his syncope at this time He may have a driving restrictions, pending w/u and results Dr. Lovena Le will see the patient later this afternoon   ADDEND: MDT checked the device, PPM clock was indeed about 1hr 52minutes ahead.  This lines up his tachycardia on 01/07/2019 to the time of his syncope This is suspect to be an SVT as well That being said, he has had others some much longer without syncope Episode on telemetry this afternoon by device is an SVT without symptoms Reviewed with Dr. Lovena Le Recommends stopping ACE and  starting Toprol.  The patient was somewhat hesitant about making medicine changes without discussing with his primary doctors, so will hold off tonight Dr. Caryl Comes will see the patient tomorrow.   For questions or updates, please contact [place] Please consult [email or web address] for contact info under    Signed, Baldwin Jamaica, PA-C  05/12/2019 12:05 PM   EP Attending  Patient seen and examined.  Agree with the findings as above. The patient's history sounds like he is dropping his bp transiently, possibly due to his SVT. I was initially led to believe that his SVT episodes were not related to his syncope but apparently his clock on his PM was off and it may well be that his SVT correlates with his episode of brief syncope. However, the patient has continued to have runs of SVT in the hospital on tele and PM interrogation demonstrates probable atrial tachycardia though I cannot definitively rule out VT.  He tends to run low bp's. I suggested stopping the lisinopril and starting low dose toprol. He is a bit reluctant concerned about his BP running low. He would like to confer with Dr. Renaldo Reel who has cared for him for many years. He also notes that his energy level has been off and his 4 mile walks are harder to complete. Catheter ablation of his SVT would be a possiblility but his non-sustained episodes would make ablation less likely to be successful. I'd suggest a trial of medical therapy.   Mikle Bosworth.D.

## 2019-05-12 NOTE — Progress Notes (Signed)
Steven Macdonald for warfarin Indication: atrial fibrillation   Assessment: 51 yom on warfarin PTA for hx PE, afib presenting with rib fractures s/p fall. Pharmacy consulted to dose warfarin inpatient. INR therapeutic 2.8 on admit now up to 3.3. CBC stable, CT negative for any bleeding  PTA warfarin dose: 7.5mg  daily except 10mg  on MWF (last dose 05/10/19 PTA)  Goal of Therapy:  INR 2-3 Monitor platelets by anticoagulation protocol: Yes   Plan:  Warfarin 5mg  PO tonight - will give reduced dose Daily protime   Steven Macdonald, PharmD, BCPS Clinical Pharmacist [phone] Please check AMION for all [place] numbers 05/12/2019

## 2019-05-12 NOTE — Progress Notes (Signed)
  Echocardiogram 2D Echocardiogram has been performed.  Jolan Upchurch A Drenda Sobecki 05/12/2019, 2:33 PM

## 2019-05-12 NOTE — Progress Notes (Signed)
Carotid duplex  has been completed. Refer to [facility] under chart review to view preliminary results.   05/12/2019  3:49 PM Zayna Toste, Bonnye Fava

## 2019-05-12 NOTE — Progress Notes (Signed)
[facility] PROGRESS NOTE  Steven Macdonald [id number] DOB: 11/14/1934 DOA: 05/11/2019 PCP: Crist Infante, MD  Assessment/Plan: 1.  SVT associated with sick sinus syndrome, [employer] pacemaker and episode of syncope:  Evaluated by EP who opine timing of svt on device does not match timing of syncope. Requesting further interrogation.  -await further recs from EP  2.  Hemothorax with pneumothorax on the left: Surgery does not feel patient needs a chest tube. Repeat chest xray with minimal bibasilar subsegmental atelectasis. Small left pleural effusion may be present, stable mildly displaced left eighth rib fracture.  Patient fairly mobile on own. CBC stable.   3.  Multiple closed fractures of the ribs on the left side: Incentive spirometry: Flutter valve: Tylenol around-the-clock scheduled: with good pain control.  Additional pain medicines as needed.  Encourage patient to get up and around as much as possible.  4.  Pulmonary contusion: Patient is anticoagulated due to his atrial fibrillation.  Concerned that he may develop more bleeding will monitor closely.  Hg stable drifted down to 14.4 from 15.5 yesterday. monitor  5.  Nonischemic cardiomyopathy: Noted continue management per cardiology.  Coagulated on warfarin pharmacy to dose  Code Status: full Family Communication: patient  Disposition Plan: home when ready   Consultants:  Charlcie Cradle EP  ramirez trauma  nishan cards  Procedures:    Antibiotics:    HPI/Subjective: Awake alert. Reports pain/soreness with movement but states tylenol helping. Denies sob, chest pain  Objective: Vitals:   05/12/19 0503 05/12/19 0854  BP: 110/66 134/78  Pulse: 60   Resp: 17   Temp: 98 F (36.7 C)   SpO2: 96%     Intake/Output Summary (Last 24 hours) at 05/12/2019 1310 Last data filed at 05/12/2019 0800 Gross per 24 hour  Intake 600 ml  Output -  Net 600 ml   Filed Weights   05/11/19 2028 05/12/19 0503  Weight: 78.4  kg 78 kg    Exam:   General:  Awake alert trim no acute distress  Cardiovascular: rrr no mgr no LE edema  Respiratory: normal effort but respers slightly shallow no wheeze or crackles  Abdomen: non-distended non-tender +BS   Musculoskeletal: joints without swelling/erythema   Data Reviewed: Basic Metabolic Panel: Recent Labs  Lab 05/11/19 1248 05/11/19 1356 05/12/19 0452  NA 136  --  138  K 4.0  --  4.0  CL 103  --  106  CO2 26  --  26  GLUCOSE 100*  --  99  BUN 18  --  13  CREATININE 1.02  --  0.99  CALCIUM 9.9  --  9.2  MG  --  2.1  --    Liver Function Tests: Recent Labs  Lab 05/11/19 1356  AST 27  ALT 20  ALKPHOS 54  BILITOT 1.8*  PROT 6.8  ALBUMIN 3.8   No results for input(s): LIPASE, AMYLASE in the last 168 hours. No results for input(s): AMMONIA in the last 168 hours. CBC: Recent Labs  Lab 05/11/19 1248 05/12/19 0452  WBC 8.5 7.1  HGB 15.5 14.4  HCT 47.1 43.0  MCV 87.7 87.9  PLT 122* PLATELET CLUMPS NOTED ON SMEAR, UNABLE TO ESTIMATE   Cardiac Enzymes: No results for input(s): CKTOTAL, CKMB, CKMBINDEX, TROPONINI in the last 168 hours. BNP (last 3 results) Recent Labs    05/11/19 1513  BNP 57.0    ProBNP (last 3 results) No results for input(s): PROBNP in the last 8760 hours.  CBG: No results for  input(s): GLUCAP in the last 168 hours.  Recent Results (from the past 240 hour(s))  SARS CORONAVIRUS 2 (TAT 6-24 HRS) Nasopharyngeal Nasopharyngeal Swab     Status: None   Collection Time: 05/11/19  4:02 PM   Specimen: Nasopharyngeal Swab  Result Value Ref Range Status   SARS Coronavirus 2 NEGATIVE NEGATIVE Final    Comment: (NOTE) SARS-CoV-2 target nucleic acids are NOT DETECTED. The SARS-CoV-2 RNA is generally detectable in upper and lower respiratory specimens during the acute phase of infection. Negative results do not preclude SARS-CoV-2 infection, do not rule out co-infections with other pathogens, and should not be used as  the sole basis for treatment or other patient management decisions. Negative results must be combined with clinical observations, patient history, and epidemiological information. The expected result is Negative. Fact Sheet for Patients: SugarRoll.be Fact Sheet for Healthcare Providers: https://www.woods-mathews.com/ This test is not yet approved or cleared by the [place] FDA and  has been authorized for detection and/or diagnosis of SARS-CoV-2 by FDA under an Emergency Use Authorization (EUA). This EUA will remain  in effect (meaning this test can be used) for the duration of the COVID-19 declaration under Section 56 4(b)(1) of the Act, 21 U.S.C. section 360bbb-3(b)(1), unless the authorization is terminated or revoked sooner. Performed at [facility], [place] [address], [place], [place] [address]      Studies: X-ray Chest Pa And Lateral  Result Date: 05/12/2019 CLINICAL DATA:  Pneumothorax. EXAM: CHEST - 2 VIEW COMPARISON:  May 11, 2019. FINDINGS: The heart size and mediastinal contours are within normal limits. Right-sided pacemaker is unchanged in position. Minimal bibasilar subsegmental atelectasis is noted. No pneumothorax is noted. Small left pleural effusion may be present. Mildly displaced left eighth rib fracture is again noted. IMPRESSION: Minimal bibasilar subsegmental atelectasis. Small left pleural effusion may be present. Stable mildly displaced left eighth rib fracture is noted. Electronically Signed   By: Marijo Conception M.D.   On: 05/12/2019 08:00   Dg Chest 2 View  Result Date: 05/11/2019 CLINICAL DATA:  Pt had syncopal episode yesterday. States he fell and hit back. Reports + LOC for a few seconds. C/o L sided chest pain since fall, mild sob. Denies nausea and vomiting. EXAM: CHEST - 2 VIEW COMPARISON:  None. FINDINGS: Mildly displaced fracture of the lateral left eighth rib. No other visualized fractures. No  pneumothorax or pleural effusion. There is linear opacity at the lung bases, greater on the left, consistent with atelectasis. Remainder of the lungs is clear. Cardiac silhouette is mildly enlarged. No mediastinal or hilar masses. Stable right anterior chest wall pacemaker. IMPRESSION: 1. Mildly displaced fracture of the lateral left eighth rib. No pneumothorax. 2. Mild lung base atelectasis, left greater than right. Electronically Signed   By: Lajean Manes M.D.   On: 05/11/2019 13:11   Ct Head Wo Contrast  Result Date: 05/11/2019 CLINICAL DATA:  Syncopal episode with a fall from standing. Head and neck pain. EXAM: CT HEAD WITHOUT CONTRAST CT CERVICAL SPINE WITHOUT CONTRAST TECHNIQUE: Multidetector CT imaging of the head and cervical spine was performed following the standard protocol without intravenous contrast. Multiplanar CT image reconstructions of the cervical spine were also generated. COMPARISON:  None. FINDINGS: CT HEAD FINDINGS Brain: No evidence of acute infarction, hemorrhage, hydrocephalus, extra-axial collection or mass lesion/mass effect. There is mild cerebral volume loss with associated ex vacuo dilatation. Periventricular white matter hypoattenuation likely represents chronic small vessel ischemic disease. Vascular: There are vascular calcifications in the  carotid siphons. Skull: Normal. Negative for fracture or focal lesion. Sinuses/Orbits: There is bilateral maxillary, bilateral ethmoid, and right frontal sinus disease. Other: None. CT CERVICAL SPINE FINDINGS Alignment: Normal. Skull base and vertebrae: No acute fracture. No primary bone lesion or focal pathologic process. Soft tissues and spinal canal: No prevertebral fluid or swelling. No visible canal hematoma. Disc levels: Multilevel degenerative disc and joint disease results in up to severe neuroforaminal stenosis and moderate to severe central canal stenosis. Upper chest: A left pneumothorax is partially imaged. 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 IMPRESSION: 1. No acute intracranial process. 2. No acute osseous injury in the cervical spine. 3. Partially imaged left pneumothorax. Electronically Signed   By: Zerita Boers M.D.   On: 05/11/2019 15:07   Ct Chest W Contrast  Addendum Date: 05/11/2019   ADDENDUM REPORT: 05/11/2019 15:51 ADDENDUM: In the FINDINGS section, the report should note that the left eighth rib fracture is displaced and overriding. Critical Value/emergent results were called by telephone at the time of interpretation on 05/11/2019 at 3:49 pm to [place] , who verbally acknowledged these results. Electronically Signed   By: Zerita Boers M.D.   On: 05/11/2019 15:51   Result Date: 05/11/2019 CLINICAL DATA:  Syncopal episode with a fall. EXAM: CT CHEST WITH CONTRAST TECHNIQUE: Multidetector CT imaging of the chest was performed during intravenous contrast administration. CONTRAST:  16mL OMNIPAQUE IOHEXOL 300 MG/ML  SOLN COMPARISON:  Chest radiograph from the same day and CT chest dated 07/26/2017. FINDINGS: Cardiovascular: The ascending aorta measures 4.0 cm in diameter, not significantly changed since 11/25/2018 vascular calcifications are seen in the aortic arch. The heart is enlarged. No pericardial effusion. A right chest cardiac device has leads terminating in the right atrium and right ventricle. Mediastinum/Nodes: No enlarged mediastinal, hilar, or axillary lymph nodes. Thyroid gland, trachea, and esophagus demonstrate no significant findings. Lungs/Pleura: There is a small left hemopneumothorax. Ground-glass opacities and consolidation in the left lower lung likely represent contusion. Ground-glass opacities in the right upper lobe may represent contusion. There is mild dependent atelectasis in the right lower lobe. There is no right pleural effusion or pneumothorax. Upper Abdomen: No acute abnormality. Musculoskeletal: There  fractures of the left third and 8 transverse processes. There fractures of the posteromedial left sixth, seventh, and tenth ribs as well as the lateral seventh through ninth ribs. IMPRESSION: 1. Fractures of left sixth through tenth ribs as well as the left third and 8 transverse processes. The left seventh rib is a segmental fracture. 2. Small left hemopneumothorax. 3. Ground-glass opacities and consolidation in the left lower lung likely represent contusion. 4. Ground-glass opacities in the right upper lobe may represent contusion. Initial follow-up with CT at 6-12 months is recommended to confirm persistence. If persistent, repeat CT is recommended every 2 years until 5 years of stability has been established. This recommendation follows the consensus statement: Guidelines for Management of Incidental Pulmonary Nodules Detected on CT Images: From the Fleischner Society 2017; Radiology 2017; 284:228-243. 5. Unchanged ascending aortic aneurysm measuring 4.0 cm. Recommend semi-annual imaging followup by CTA or MRA and referral to cardiothoracic surgery if not already obtained. This recommendation follows 2010 ACCF/AHA/AATS/ACR/ASA/SCA/SCAI/SIR/STS/SVM Guidelines for the Diagnosis and Management of Patients With Thoracic Aortic Disease. Circulation. 2010; [id number]. Aortic aneurysm NOS (ICD10-I71.9) Aortic Atherosclerosis (ICD10-I70.0). Aortic aneurysm NOS (ICD10-I71.9). Electronically Signed: By: Zerita Boers M.D. On: 05/11/2019 15:25   Ct Cervical Spine Wo Contrast  Result Date:  05/11/2019 CLINICAL DATA:  Syncopal episode with a fall from standing. Head and neck pain. EXAM: CT HEAD WITHOUT CONTRAST CT CERVICAL SPINE WITHOUT CONTRAST TECHNIQUE: Multidetector CT imaging of the head and cervical spine was performed following the standard protocol without intravenous contrast. Multiplanar CT image reconstructions of the cervical spine were also generated. COMPARISON:  None. FINDINGS: CT HEAD FINDINGS Brain:  No evidence of acute infarction, hemorrhage, hydrocephalus, extra-axial collection or mass lesion/mass effect. There is mild cerebral volume loss with associated ex vacuo dilatation. Periventricular white matter hypoattenuation likely represents chronic small vessel ischemic disease. Vascular: There are vascular calcifications in the carotid siphons. Skull: Normal. Negative for fracture or focal lesion. Sinuses/Orbits: There is bilateral maxillary, bilateral ethmoid, and right frontal sinus disease. Other: None. CT CERVICAL SPINE FINDINGS Alignment: Normal. Skull base and vertebrae: No acute fracture. No primary bone lesion or focal pathologic process. Soft tissues and spinal canal: No prevertebral fluid or swelling. No visible canal hematoma. Disc levels: Multilevel degenerative disc and joint disease results in up to severe neuroforaminal stenosis and moderate to severe central canal stenosis. Upper chest: A left pneumothorax is partially imaged. 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 IMPRESSION: 1. No acute intracranial process. 2. No acute osseous injury in the cervical spine. 3. Partially imaged left pneumothorax. Electronically Signed   By: Zerita Boers M.D.   On: 05/11/2019 15:07    Scheduled Meds: . acetaminophen  1,000 mg Oral Q6H  . cholecalciferol  1,000 Units Oral q morning - 10a  . finasteride  5 mg Oral q morning - 10a  . lisinopril  5 mg Oral q morning - 10a  . mirtazapine  15 mg Oral QHS  . omega-3 acid ethyl esters  1 g Oral Daily  . polyethylene glycol  17 g Oral Daily  . psyllium  1 packet Oral Daily  . senna-docusate  2 tablet Oral BID  . sodium chloride flush  3 mL Intravenous Once  . sodium chloride flush  3 mL Intravenous Q12H  . tamsulosin  0.4 mg Oral q morning - 10a  . vitamin B-12  100 mcg Oral q morning - 10a  . warfarin  5 mg Oral ONCE-1800  . Warfarin - Pharmacist Dosing Inpatient   Does not apply q1800    Continuous Infusions: . sodium chloride      Principal Problem:   SVT (supraventricular tachycardia) (HCC) Active Problems:   SICK SINUS/ TACHY-BRADY SYNDROME   Syncope due to sick sinus syndrome (HCC)   Nonischemic cardiomyopathy (HCC)   Hemothorax on left   Pneumothorax, closed, traumatic, initial encounter   Multiple closed fractures of ribs of left side   Left pulmonary contusion   Pacemaker-[employer]    Time spent: 45 minutes    [place] NP  [facility]  If 7PM-7AM, please contact night-coverage at [email or web address], password [facility] 05/12/2019, 1:10 PM  LOS: 0 days

## 2019-05-12 NOTE — Progress Notes (Signed)
Patient ID: Steven Macdonald, male   DOB: Jan 29, 1935, 84 y.o.   MRN: [id number]       Subjective: C/o some pain on left flank from fib fractures.  Pulling between 1500-1750 on IS and better at times per patient.  Mobilizing well on his own as he sat up and on the edge of the bed for me while I was examining him.  ROS: See above, otherwise other systems negative  Objective: Vital signs in last 24 hours: Temp:  [97.7 F (36.5 C)-98 F (36.7 C)] 98 F (36.7 C) (11/23 0503) Pulse Rate:  [58-64] 60 (11/23 0503) Resp:  [16-21] 17 (11/23 0503) BP: (110-157)/(66-88) 134/78 (11/23 0854) SpO2:  [94 %-100 %] 96 % (11/23 0503) Weight:  [78 kg-78.4 kg] 78 kg (11/23 0503)    Intake/Output from previous day: 11/22 0701 - 11/23 0700 In: 360 [P.O.:360] Out: -  Intake/Output this shift: No intake/output data recorded.  PE: Gen: NAD Heart: regular, pacer in place Lungs: CTAB.  Left flank and posterior chest wall tenderness as expected.  No ecchymosis noted Abd: soft, NT, ND, +BS Ext: MAE, normal ROM  Lab Results:  Recent Labs    05/11/19 1248 05/12/19 0452  WBC 8.5 7.1  HGB 15.5 14.4  HCT 47.1 43.0  PLT 122* PLATELET CLUMPS NOTED ON SMEAR, UNABLE TO ESTIMATE   BMET Recent Labs    05/11/19 1248 05/12/19 0452  NA 136 138  K 4.0 4.0  CL 103 106  CO2 26 26  GLUCOSE 100* 99  BUN 18 13  CREATININE 1.02 0.99  CALCIUM 9.9 9.2   PT/INR Recent Labs    05/11/19 1356 05/12/19 0452  LABPROT 29.3* 33.9*  INR 2.8* 3.3*   CMP     Component Value Date/Time   NA 138 05/12/2019 0452   NA 140 10/16/2017 0956   K 4.0 05/12/2019 0452   CL 106 05/12/2019 0452   CO2 26 05/12/2019 0452   GLUCOSE 99 05/12/2019 0452   BUN 13 05/12/2019 0452   BUN 19 10/16/2017 0956   CREATININE 0.99 05/12/2019 0452   CALCIUM 9.2 05/12/2019 0452   PROT 6.8 05/11/2019 1356   ALBUMIN 3.8 05/11/2019 1356   AST 27 05/11/2019 1356   ALT 20 05/11/2019 1356   ALKPHOS 54 05/11/2019 1356   BILITOT 1.8 (H)  05/11/2019 1356   GFRNONAA >60 05/12/2019 0452   GFRAA >60 05/12/2019 0452   Lipase     Component Value Date/Time   LIPASE 32 03/28/2017 2353       Studies/Results: X-ray Chest Pa And Lateral  Result Date: 05/12/2019 CLINICAL DATA:  Pneumothorax. EXAM: CHEST - 2 VIEW COMPARISON:  May 11, 2019. FINDINGS: The heart size and mediastinal contours are within normal limits. Right-sided pacemaker is unchanged in position. Minimal bibasilar subsegmental atelectasis is noted. No pneumothorax is noted. Small left pleural effusion may be present. Mildly displaced left eighth rib fracture is again noted. IMPRESSION: Minimal bibasilar subsegmental atelectasis. Small left pleural effusion may be present. Stable mildly displaced left eighth rib fracture is noted. Electronically Signed   By: Marijo Conception M.D.   On: 05/12/2019 08:00   Dg Chest 2 View  Result Date: 05/11/2019 CLINICAL DATA:  Pt had syncopal episode yesterday. States he fell and hit back. Reports + LOC for a few seconds. C/o L sided chest pain since fall, mild sob. Denies nausea and vomiting. EXAM: CHEST - 2 VIEW COMPARISON:  None. FINDINGS: Mildly displaced fracture of the lateral left eighth  rib. No other visualized fractures. No pneumothorax or pleural effusion. There is linear opacity at the lung bases, greater on the left, consistent with atelectasis. Remainder of the lungs is clear. Cardiac silhouette is mildly enlarged. No mediastinal or hilar masses. Stable right anterior chest wall pacemaker. IMPRESSION: 1. Mildly displaced fracture of the lateral left eighth rib. No pneumothorax. 2. Mild lung base atelectasis, left greater than right. Electronically Signed   By: Lajean Manes M.D.   On: 05/11/2019 13:11   Ct Head Wo Contrast  Result Date: 05/11/2019 CLINICAL DATA:  Syncopal episode with a fall from standing. Head and neck pain. EXAM: CT HEAD WITHOUT CONTRAST CT CERVICAL SPINE WITHOUT CONTRAST TECHNIQUE: Multidetector CT  imaging of the head and cervical spine was performed following the standard protocol without intravenous contrast. Multiplanar CT image reconstructions of the cervical spine were also generated. COMPARISON:  None. FINDINGS: CT HEAD FINDINGS Brain: No evidence of acute infarction, hemorrhage, hydrocephalus, extra-axial collection or mass lesion/mass effect. There is mild cerebral volume loss with associated ex vacuo dilatation. Periventricular white matter hypoattenuation likely represents chronic small vessel ischemic disease. Vascular: There are vascular calcifications in the carotid siphons. Skull: Normal. Negative for fracture or focal lesion. Sinuses/Orbits: There is bilateral maxillary, bilateral ethmoid, and right frontal sinus disease. Other: None. CT CERVICAL SPINE FINDINGS Alignment: Normal. Skull base and vertebrae: No acute fracture. No primary bone lesion or focal pathologic process. Soft tissues and spinal canal: No prevertebral fluid or swelling. No visible canal hematoma. Disc levels: Multilevel degenerative disc and joint disease results in up to severe neuroforaminal stenosis and moderate to severe central canal stenosis. Upper chest: A left pneumothorax is partially imaged. 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 IMPRESSION: 1. No acute intracranial process. 2. No acute osseous injury in the cervical spine. 3. Partially imaged left pneumothorax. Electronically Signed   By: Zerita Boers M.D.   On: 05/11/2019 15:07   Ct Chest W Contrast  Addendum Date: 05/11/2019   ADDENDUM REPORT: 05/11/2019 15:51 ADDENDUM: In the FINDINGS section, the report should note that the left eighth rib fracture is displaced and overriding. Critical Value/emergent results were called by telephone at the time of interpretation on 05/11/2019 at 3:49 pm to [place] , who verbally acknowledged these results. Electronically Signed   By: Zerita Boers  M.D.   On: 05/11/2019 15:51   Result Date: 05/11/2019 CLINICAL DATA:  Syncopal episode with a fall. EXAM: CT CHEST WITH CONTRAST TECHNIQUE: Multidetector CT imaging of the chest was performed during intravenous contrast administration. CONTRAST:  29mL OMNIPAQUE IOHEXOL 300 MG/ML  SOLN COMPARISON:  Chest radiograph from the same day and CT chest dated 07/26/2017. FINDINGS: Cardiovascular: The ascending aorta measures 4.0 cm in diameter, not significantly changed since 11/25/2018 vascular calcifications are seen in the aortic arch. The heart is enlarged. No pericardial effusion. A right chest cardiac device has leads terminating in the right atrium and right ventricle. Mediastinum/Nodes: No enlarged mediastinal, hilar, or axillary lymph nodes. Thyroid gland, trachea, and esophagus demonstrate no significant findings. Lungs/Pleura: There is a small left hemopneumothorax. Ground-glass opacities and consolidation in the left lower lung likely represent contusion. Ground-glass opacities in the right upper lobe may represent contusion. There is mild dependent atelectasis in the right lower lobe. There is no right pleural effusion or pneumothorax. Upper Abdomen: No acute abnormality. Musculoskeletal: There fractures of the left third and 8 transverse processes. There fractures of the posteromedial left sixth, seventh, and  tenth ribs as well as the lateral seventh through ninth ribs. IMPRESSION: 1. Fractures of left sixth through tenth ribs as well as the left third and 8 transverse processes. The left seventh rib is a segmental fracture. 2. Small left hemopneumothorax. 3. Ground-glass opacities and consolidation in the left lower lung likely represent contusion. 4. Ground-glass opacities in the right upper lobe may represent contusion. Initial follow-up with CT at 6-12 months is recommended to confirm persistence. If persistent, repeat CT is recommended every 2 years until 5 years of stability has been established. This  recommendation follows the consensus statement: Guidelines for Management of Incidental Pulmonary Nodules Detected on CT Images: From the Fleischner Society 2017; Radiology 2017; 284:228-243. 5. Unchanged ascending aortic aneurysm measuring 4.0 cm. Recommend semi-annual imaging followup by CTA or MRA and referral to cardiothoracic surgery if not already obtained. This recommendation follows 2010 ACCF/AHA/AATS/ACR/ASA/SCA/SCAI/SIR/STS/SVM Guidelines for the Diagnosis and Management of Patients With Thoracic Aortic Disease. Circulation. 2010; [id number]. Aortic aneurysm NOS (ICD10-I71.9) Aortic Atherosclerosis (ICD10-I70.0). Aortic aneurysm NOS (ICD10-I71.9). Electronically Signed: By: Zerita Boers M.D. On: 05/11/2019 15:25   Ct Cervical Spine Wo Contrast  Result Date: 05/11/2019 CLINICAL DATA:  Syncopal episode with a fall from standing. Head and neck pain. EXAM: CT HEAD WITHOUT CONTRAST CT CERVICAL SPINE WITHOUT CONTRAST TECHNIQUE: Multidetector CT imaging of the head and cervical spine was performed following the standard protocol without intravenous contrast. Multiplanar CT image reconstructions of the cervical spine were also generated. COMPARISON:  None. FINDINGS: CT HEAD FINDINGS Brain: No evidence of acute infarction, hemorrhage, hydrocephalus, extra-axial collection or mass lesion/mass effect. There is mild cerebral volume loss with associated ex vacuo dilatation. Periventricular white matter hypoattenuation likely represents chronic small vessel ischemic disease. Vascular: There are vascular calcifications in the carotid siphons. Skull: Normal. Negative for fracture or focal lesion. Sinuses/Orbits: There is bilateral maxillary, bilateral ethmoid, and right frontal sinus disease. Other: None. CT CERVICAL SPINE FINDINGS Alignment: Normal. Skull base and vertebrae: No acute fracture. No primary bone lesion or focal pathologic process. Soft tissues and spinal canal: No prevertebral fluid or swelling. No  visible canal hematoma. Disc levels: Multilevel degenerative disc and joint disease results in up to severe neuroforaminal stenosis and moderate to severe central canal stenosis. Upper chest: A left pneumothorax is partially imaged. 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 IMPRESSION: 1. No acute intracranial process. 2. No acute osseous injury in the cervical spine. 3. Partially imaged left pneumothorax. Electronically Signed   By: Zerita Boers M.D.   On: 05/11/2019 15:07    Anti-infectives: Anti-infectives (From admission, onward)   None       Assessment/Plan fall Left 6 through 10 rib fractures - pain control, IS, pulm toilet, PT/OT, but patient seems to be mobilizing around in his bed and sitting up well on his own. Will need follow up with PCP prn Left 3 and 8 TP fracture - pain control and mobilize Left hemopneumothorax - follow CXR is without evidence of PTX.  Small effusion noted.  Cont IS and pulm toileting SVT - per medicine/cards Hyperlipidemia Diabetes Cardiomyopathy FEN - regular diet VTE - coumadin ID - none currently needed   LOS: 0 days    Henreitta Cea , [facility] 05/12/2019, 9:12 AM Please see Amion for pager number during day hours 7:00am-4:30pm

## 2019-05-12 NOTE — Progress Notes (Signed)
PT Cancellation Note  Patient Details Name: Steven Macdonald MRN: [id number] DOB: 10/29/1934   Cancelled Treatment:    Reason Eval/Treat Not Completed: PT screened, no needs identified, will sign off. Pt has been mobilizing modified independently in room and hall. Observed getting out of bed and pt using good technique. Encouraged pt to continue mobilizing.   Shary Decamp [facility] 05/12/2019, 4:26 PM [place] Pager [phone] Office [phone]

## 2019-05-13 DIAGNOSIS — K5900 Constipation, unspecified: Secondary | ICD-10-CM

## 2019-05-13 LAB — CBC
HCT: 44.2 % (ref 39.0–52.0)
Hemoglobin: 14.2 g/dL (ref 13.0–17.0)
MCH: 28.5 pg (ref 26.0–34.0)
MCHC: 32.1 g/dL (ref 30.0–36.0)
MCV: 88.6 fL (ref 80.0–100.0)
Platelets: 122 10*3/uL — ABNORMAL LOW (ref 150–400)
RBC: 4.99 MIL/uL (ref 4.22–5.81)
RDW: 13.8 % (ref 11.5–15.5)
WBC: 5.9 10*3/uL (ref 4.0–10.5)
nRBC: 0 % (ref 0.0–0.2)

## 2019-05-13 LAB — PROTIME-INR
INR: 3.3 — ABNORMAL HIGH (ref 0.8–1.2)
Prothrombin Time: 33.6 seconds — ABNORMAL HIGH (ref 11.4–15.2)

## 2019-05-13 MED ORDER — WARFARIN SODIUM 2 MG PO TABS
3.0000 mg | ORAL_TABLET | Freq: Once | ORAL | Status: AC
  Administered 2019-05-13: 3 mg via ORAL
  Filled 2019-05-13: qty 1

## 2019-05-13 MED ORDER — SORBITOL 70 % SOLN
960.0000 mL | TOPICAL_OIL | Freq: Once | ORAL | Status: AC
  Administered 2019-05-13: 960 mL via RECTAL
  Filled 2019-05-13 (×3): qty 473

## 2019-05-13 MED ORDER — PROPAFENONE HCL ER 225 MG PO CP12
225.0000 mg | ORAL_CAPSULE | Freq: Two times a day (BID) | ORAL | Status: DC
  Administered 2019-05-13 – 2019-05-14 (×2): 225 mg via ORAL
  Filled 2019-05-13 (×3): qty 1

## 2019-05-13 NOTE — Progress Notes (Signed)
[facility] for warfarin Indication: atrial fibrillation   Assessment: 24 yom on warfarin PTA for hx PE, afib presenting with rib fractures s/p fall. Pharmacy consulted to dose warfarin inpatient. INR therapeutic 2.8 on admit now up to 3.3 again today. CBC stable, CT negative for any bleeding  PTA warfarin dose: 7.5mg  daily except 10mg  on MWF (last dose 05/10/19 PTA)  Goal of Therapy:  INR 2-3 Monitor platelets by anticoagulation protocol: Yes   Plan:  Warfarin 3mg  PO tonight - will give reduced dose Daily protime   Steven Macdonald, PharmD, BCPS Clinical Pharmacist [phone] Please check AMION for all [place] numbers 05/13/2019

## 2019-05-13 NOTE — Progress Notes (Signed)
PROGRESS NOTE    Steven Macdonald  [id number] DOB: 1935-05-21 DOA: 05/11/2019 PCP: Crist Infante, MD     Brief Narrative:  As per H&P written by Dr. Evangeline Macdonald on 05/11/19 Steven Macdonald is a 84 y.o. male here with a syncopal event causing multiple closed fracture of ribs.  Appreciate EP consult.  Assessment & Plan: 1-SVT (supraventricular tachycardia) ([place]); SICK SINUS/ TACHY-BRADY SYNDROME -With presentation of syncope -Orthostatic vital signs negative -Electrophysiology is on board and will follow recommendations -Continue monitoring on telemetry at this time -Patient denies chest pain or palpitations; but expresses still intermittent episode of lightheadedness. -Continue Coumadin for secondary prevention; pharmacy dosing.  2-status post pacemaker-[employer] implantation -EP on board will follow any further recommendations -Based on abnormal SVT appreciated potential plan is to start low-dose metoprolol at discharge.  3-Nonischemic cardiomyopathy ([place]) -Ejection fraction 40-45% -Will let cardiology service recommend on ischemia work-up needs. -Metoprolol if it started will be appropriate based on his ejection fraction -Low-sodium diet and daily weights has been discussed with patient.    4-Hemothorax/pneumothorax on left; multiple closed ribs fractures -Stable -Continue Tylenol for pain management.  5-BPH -Patient denies urinary retention symptoms -Continue Proscar and Flomax.  6-hypertension -Chronically using lisinopril as an outpatient -Continue holding lisinopril for now -No orthostatic changes and a stable blood pressure appreciated.  7-constipation -Smog enema will be provided -Continue psyllium and Senokot. -Patient reports last BM on 05/10/19. -No nausea, no vomiting, no abdominal pain.   DVT prophylaxis: Coumadin Code Status: Full code Family Communication: No family at bedside. Disposition Plan: Anticipate discharge home on 05/14/2019 after final  recommendations from EP cardiology service.  Enema along with stool softener and a stimulant laxative will be provided to achieved bowel movement.  Consultants:   Cardiology service  Procedures:   2D echo: 1. Left ventricular ejection fraction, by visual estimation, is 40 to 45%. The left ventricle has mild to moderately decreased function. Left ventricular septal wall thickness was severely increased. There is severely increased left ventricular  hypertrophy. 2. Mid and apical anterior wall, mid anteroseptal segment, apex, and basal anteroseptal segment are abnormal. 3. Abnormal septal motion consistent with RV pacemaker. 4. The left ventricle demonstrates regional wall motion abnormalities. 5. Global right ventricle has moderately reduced systolic function.The right ventricular size is normal. No increase in right ventricular wall thickness. 6. Left atrial size was normal. 7. Right atrial size was normal. 8. The mitral valve is grossly normal. Trace mitral valve regurgitation. 9. The tricuspid valve is normal in structure. Tricuspid valve regurgitation is mild. 10. The aortic valve is tricuspid. Aortic valve regurgitation is trivial. Mild aortic valve sclerosis without stenosis. 11. There is Mild calcification of the aortic valve. 12. There is Mild thickening of the aortic valve. 13. The pulmonic valve was grossly normal. Pulmonic valve regurgitation is not visualized. 14. Aortic dilatation noted. 15. There is mild dilatation of the ascending aorta measuring 40 mm. 16. A pacer wire is visualized in the RA and RV. 17. EF decreased compared to prior. There is mild global hypokinesis and abnormal septal motion due to RV pacings, but the anterior and anteroseptal walls as noted are hypokinetic/akinetic to the apex. LV thrombus excluded by echo contrast.  Antimicrobials:  Anti-infectives (From admission, onward)   None      Subjective: Complaining of severe constipation, no  chest pain, no palpitations, no nausea, no vomiting, reports feeling full in his stomach.  Appetite is poor.  Objective: Vitals:   05/12/19 1555 05/13/19 0500 05/13/19 [id number]  05/13/19 1300  BP: 123/76  (!) 152/85 (!) 150/104  Pulse: (!) 59  62 68  Resp: 18  20 19   Temp: 97.9 F (36.6 C)  (!) 97.5 F (36.4 C) 97.6 F (36.4 C)  TempSrc: Oral  Oral Oral  SpO2: 96%  97%   Weight:  77.9 kg    Height:        Intake/Output Summary (Last 24 hours) at 05/13/2019 1836 Last data filed at 05/13/2019 0853 Gross per 24 hour  Intake 360 ml  Output -  Net 360 ml   Filed Weights   05/11/19 2028 05/12/19 0503 05/13/19 0500  Weight: 78.4 kg 78 kg 77.9 kg    Examination: General exam: Alert, awake, oriented x 3; reports still feeling intermittently lightheaded, no chest pain, no nausea, no vomiting.  Complaining of severe constipation. Respiratory system: Clear to auscultation. Respiratory effort normal. Cardiovascular system:Rate controlled currently. No rubs, No gallops. No JVD. Gastrointestinal system: Abdomen is nondistended, soft and nontender. No organomegaly or masses felt. Normal bowel sounds heard. Central nervous system: Alert and oriented. No focal neurological deficits. Extremities: No C/C/E, +pedal pulses Skin: No rashes, lesions or ulcers Psychiatry: Judgement and insight appear normal. Mood & affect appropriate.     Data Reviewed: I have personally reviewed following labs and imaging studies  CBC: Recent Labs  Lab 05/11/19 1248 05/12/19 0452 05/13/19 0416  WBC 8.5 7.1 5.9  HGB 15.5 14.4 14.2  HCT 47.1 43.0 44.2  MCV 87.7 87.9 88.6  PLT 122* PLATELET CLUMPS NOTED ON SMEAR, UNABLE TO ESTIMATE 123XX123*   Basic Metabolic Panel: Recent Labs  Lab 05/11/19 1248 05/11/19 1356 05/12/19 0452  NA 136  --  138  K 4.0  --  4.0  CL 103  --  106  CO2 26  --  26  GLUCOSE 100*  --  99  BUN 18  --  13  CREATININE 1.02  --  0.99  CALCIUM 9.9  --  9.2  MG  --  2.1  --    GFR:  Estimated Creatinine Clearance: 63.4 mL/min (by C-G formula based on SCr of 0.99 mg/dL).   Liver Function Tests: Recent Labs  Lab 05/11/19 1356  AST 27  ALT 20  ALKPHOS 54  BILITOT 1.8*  PROT 6.8  ALBUMIN 3.8   Coagulation Profile: Recent Labs  Lab 05/11/19 1356 05/12/19 0452 05/13/19 0416  INR 2.8* 3.3* 3.3*   Urine analysis:    Component Value Date/Time   COLORURINE STRAW (A) 03/28/2017 0045   APPEARANCEUR CLEAR 03/28/2017 0045   LABSPEC 1.004 (L) 03/28/2017 0045   PHURINE 6.0 03/28/2017 0045   GLUCOSEU NEGATIVE 03/28/2017 0045   HGBUR NEGATIVE 03/28/2017 0045   BILIRUBINUR NEGATIVE 03/28/2017 0045   KETONESUR NEGATIVE 03/28/2017 0045   PROTEINUR NEGATIVE 03/28/2017 0045   UROBILINOGEN 0.2 12/25/2006 0236   NITRITE NEGATIVE 03/28/2017 0045   LEUKOCYTESUR NEGATIVE 03/28/2017 0045    Recent Results (from the past 240 hour(s))  SARS CORONAVIRUS 2 (TAT 6-24 HRS) Nasopharyngeal Nasopharyngeal Swab     Status: None   Collection Time: 05/11/19  4:02 PM   Specimen: Nasopharyngeal Swab  Result Value Ref Range Status   SARS Coronavirus 2 NEGATIVE NEGATIVE Final    Comment: (NOTE) SARS-CoV-2 target nucleic acids are NOT DETECTED. The SARS-CoV-2 RNA is generally detectable in upper and lower respiratory specimens during the acute phase of infection. Negative results do not preclude SARS-CoV-2 infection, do not rule out co-infections with other pathogens, and should not  be used as the sole basis for treatment or other patient management decisions. Negative results must be combined with clinical observations, patient history, and epidemiological information. The expected result is Negative. Fact Sheet for Patients: SugarRoll.be Fact Sheet for Healthcare Providers: https://www.woods-mathews.com/ This test is not yet approved or cleared by the [place] FDA and  has been authorized for detection and/or diagnosis of SARS-CoV-2  by FDA under an Emergency Use Authorization (EUA). This EUA will remain  in effect (meaning this test can be used) for the duration of the COVID-19 declaration under Section 56 4(b)(1) of the Act, 21 U.S.C. section 360bbb-3(b)(1), unless the authorization is terminated or revoked sooner. Performed at [facility], [place] [address] [place], [place] [address]       Radiology Studies: X-ray Chest Pa And Lateral  Result Date: 05/12/2019 CLINICAL DATA:  Pneumothorax. EXAM: CHEST - 2 VIEW COMPARISON:  May 11, 2019. FINDINGS: The heart size and mediastinal contours are within normal limits. Right-sided pacemaker is unchanged in position. Minimal bibasilar subsegmental atelectasis is noted. No pneumothorax is noted. Small left pleural effusion may be present. Mildly displaced left eighth rib fracture is again noted. IMPRESSION: Minimal bibasilar subsegmental atelectasis. Small left pleural effusion may be present. Stable mildly displaced left eighth rib fracture is noted. Electronically Signed   By: Marijo Conception M.D.   On: 05/12/2019 08:00   Vas US Carotid  Result Date: 05/13/2019 Carotid Arterial Duplex Study Indications:       Syncope and Chest pain. Comparison Study:  No priors. Performing Technologist: Oda Cogan RDMS, RVT  Examination Guidelines: A complete evaluation includes B-mode imaging, spectral Doppler, color Doppler, and power Doppler as needed of all accessible portions of each vessel. Bilateral testing is considered an integral part of a complete examination. Limited examinations for reoccurring indications may be performed as noted.  Right Carotid Findings: +----------+--------+--------+--------+------------------+------------------+           PSV cm/sEDV cm/sStenosisPlaque DescriptionComments           +----------+--------+--------+--------+------------------+------------------+ CCA Prox  100     19                                                    +----------+--------+--------+--------+------------------+------------------+ CCA Distal72      22                                intimal thickening +----------+--------+--------+--------+------------------+------------------+ ICA Prox  72      25      1-39%                     intimal thickening +----------+--------+--------+--------+------------------+------------------+ ICA Mid   59      22                                                   +----------+--------+--------+--------+------------------+------------------+ ICA Distal69      27                                                   +----------+--------+--------+--------+------------------+------------------+  ECA       116     13                                                   +----------+--------+--------+--------+------------------+------------------+ +----------+--------+-------+----------------+-------------------+           PSV cm/sEDV cmsDescribe        Arm Pressure (mmHG) +----------+--------+-------+----------------+-------------------+ [id number]             Multiphasic, WNL                    +----------+--------+-------+----------------+-------------------+ +---------+--------+--+--------+--+---------+ VertebralPSV cm/s40EDV cm/s16Antegrade +---------+--------+--+--------+--+---------+  Left Carotid Findings: +----------+--------+--------+--------+------------------+------------------+           PSV cm/sEDV cm/sStenosisPlaque DescriptionComments           +----------+--------+--------+--------+------------------+------------------+ CCA Prox  111     23                                                   +----------+--------+--------+--------+------------------+------------------+ CCA Distal79      24                                intimal thickening +----------+--------+--------+--------+------------------+------------------+ ICA Prox  69      24      1-39%                      intimal thickening +----------+--------+--------+--------+------------------+------------------+ ICA Distal72      27                                                   +----------+--------+--------+--------+------------------+------------------+ ECA       127                                                          +----------+--------+--------+--------+------------------+------------------+ +----------+--------+--------+----------------+-------------------+           PSV cm/sEDV cm/sDescribe        Arm Pressure (mmHG) +----------+--------+--------+----------------+-------------------+ [id number]              Multiphasic, WNL                    +----------+--------+--------+----------------+-------------------+ +---------+--------+--+--------+--+---------+ VertebralPSV cm/s62EDV cm/s17Antegrade +---------+--------+--+--------+--+---------+  Summary: Right Carotid: Velocities in the right ICA are consistent with a 1-39% stenosis. Left Carotid: Velocities in the left ICA are consistent with a 1-39% stenosis. Vertebrals:  Bilateral vertebral arteries demonstrate antegrade flow. Subclavians: Normal flow hemodynamics were seen in bilateral subclavian              arteries. *See table(s) above for measurements and observations.  Electronically signed by Antony Contras MD on 05/13/2019 at 10:44:55 AM.    Final     Scheduled Meds: . acetaminophen  1,000 mg Oral Q6H  . cholecalciferol  1,000 Units Oral q morning - 10a  .  finasteride  5 mg Oral q morning - 10a  . mirtazapine  15 mg Oral QHS  . omega-3 acid ethyl esters  1 g Oral Daily  . polyethylene glycol  17 g Oral Daily  . psyllium  1 packet Oral Daily  . senna-docusate  2 tablet Oral BID  . sodium chloride flush  3 mL Intravenous Once  . sodium chloride flush  3 mL Intravenous Q12H  . tamsulosin  0.4 mg Oral q morning - 10a  . vitamin B-12  100 mcg Oral q morning - 10a  . warfarin  3 mg Oral ONCE-1800  .  Warfarin - Pharmacist Dosing Inpatient   Does not apply q1800   Continuous Infusions: . sodium chloride       LOS: 1 day    Time spent: 30 minutes.   Barton Dubois, MD [facility] Pager [phone]  05/13/2019, 6:36 PM

## 2019-05-13 NOTE — Progress Notes (Addendum)
Progress Note  Patient Name: Steven Macdonald Date of Encounter: 05/13/2019  Primary Cardiologist: Dr. Caryl Comes  Subjective   Constipated (I discussed with attending, is ordering an enema) no CP, palpitations or SOB  Inpatient Medications    Scheduled Meds: . acetaminophen  1,000 mg Oral Q6H  . cholecalciferol  1,000 Units Oral q morning - 10a  . finasteride  5 mg Oral q morning - 10a  . mirtazapine  15 mg Oral QHS  . omega-3 acid ethyl esters  1 g Oral Daily  . polyethylene glycol  17 g Oral Daily  . psyllium  1 packet Oral Daily  . senna-docusate  2 tablet Oral BID  . sodium chloride flush  3 mL Intravenous Once  . sodium chloride flush  3 mL Intravenous Q12H  . sorbitol, milk of mag, mineral oil, glycerin (SMOG) enema  960 mL Rectal Once  . tamsulosin  0.4 mg Oral q morning - 10a  . vitamin B-12  100 mcg Oral q morning - 10a  . warfarin  3 mg Oral ONCE-1800  . Warfarin - Pharmacist Dosing Inpatient   Does not apply q1800   Continuous Infusions: . sodium chloride     PRN Meds: sodium chloride, ketorolac, oxyCODONE, sodium chloride flush   Vital Signs    Vitals:   05/12/19 0854 05/12/19 1555 05/13/19 0500 05/13/19 0619  BP: 134/78 123/76  (!) 152/85  Pulse:  (!) 59  62  Resp:  18  20  Temp:  97.9 F (36.6 C)  (!) 97.5 F (36.4 C)  TempSrc:  Oral  Oral  SpO2:  96%  97%  Weight:   77.9 kg   Height:        Intake/Output Summary (Last 24 hours) at 05/13/2019 1011 Last data filed at 05/13/2019 0853 Gross per 24 hour  Intake 360 ml  Output -  Net 360 ml   Last 3 Weights 05/13/2019 05/12/2019 05/11/2019  Weight (lbs) 171 lb 12.8 oz 172 lb 172 lb 12.8 oz  Weight (kg) 77.928 kg 78.019 kg 78.382 kg      Telemetry    SR, no recurrent SVT - Personally Reviewed  ECG    No new EKGs - Personally Reviewed  Physical Exam   GEN: No acute distress.   Neck: No JVD Cardiac: RRR, no murmurs, rubs, or gallops.  Respiratory: CTA b/l. GI: Soft, nontender,  non-distended  MS: No edema; No deformity. Neuro:  Nonfocal  Psych: Normal affect   Labs    High Sensitivity Troponin:   Recent Labs  Lab 05/11/19 1248 05/11/19 1513  TROPONINIHS 14 15      Chemistry Recent Labs  Lab 05/11/19 1248 05/11/19 1356 05/12/19 0452  NA 136  --  138  K 4.0  --  4.0  CL 103  --  106  CO2 26  --  26  GLUCOSE 100*  --  99  BUN 18  --  13  CREATININE 1.02  --  0.99  CALCIUM 9.9  --  9.2  PROT  --  6.8  --   ALBUMIN  --  3.8  --   AST  --  27  --   ALT  --  20  --   ALKPHOS  --  54  --   BILITOT  --  1.8*  --   GFRNONAA >60  --  >60  GFRAA >60  --  >60  ANIONGAP 7  --  6     Hematology  Recent Labs  Lab 05/11/19 1248 05/12/19 0452 05/13/19 0416  WBC 8.5 7.1 5.9  RBC 5.37 4.89 4.99  HGB 15.5 14.4 14.2  HCT 47.1 43.0 44.2  MCV 87.7 87.9 88.6  MCH 28.9 29.4 28.5  MCHC 32.9 33.5 32.1  RDW 13.7 13.9 13.8  PLT 122* PLATELET CLUMPS NOTED ON SMEAR, UNABLE TO ESTIMATE 122*    BNP Recent Labs  Lab 05/11/19 1513  BNP 57.0     DDimer No results for input(s): DDIMER in the last 168 hours.   Radiology     Vas US Carotid Result Date: 05/12/2019 Carotid Arterial Duplex Study Indications:       Syncope and Chest pain. Comparison Study:  No priors. Performing Technologist: Oda Cogan RDMS, RVT  Examination Guidelines: A complete evaluation includes B-mode imaging, spectral Doppler, color Doppler, and power Doppler as needed of all accessible portions of each vessel. Bilateral testing is considered an integral part of a complete examination. Limited examinations for reoccurring indications may be performed as noted.  Right Carotid Findings: +----------+--------+--------+--------+------------------+------------------+           PSV cm/sEDV cm/sStenosisPlaque DescriptionComments           +----------+--------+--------+--------+------------------+------------------+ CCA Prox  100     19                                                    +----------+--------+--------+--------+------------------+------------------+ CCA Distal72      22                                intimal thickening +----------+--------+--------+--------+------------------+------------------+ ICA Prox  72      25      1-39%                     intimal thickening +----------+--------+--------+--------+------------------+------------------+ ICA Mid   59      22                                                   +----------+--------+--------+--------+------------------+------------------+ ICA Distal69      27                                                   +----------+--------+--------+--------+------------------+------------------+ ECA       116     13                                                   +----------+--------+--------+--------+------------------+------------------+ +----------+--------+-------+----------------+-------------------+           PSV cm/sEDV cmsDescribe        Arm Pressure (mmHG) +----------+--------+-------+----------------+-------------------+ [id number]             Multiphasic, WNL                    +----------+--------+-------+----------------+-------------------+ +---------+--------+--+--------+--+---------+ VertebralPSV cm/s40EDV cm/s16Antegrade +---------+--------+--+--------+--+---------+  Left Carotid  Findings: +----------+--------+--------+--------+------------------+------------------+           PSV cm/sEDV cm/sStenosisPlaque DescriptionComments           +----------+--------+--------+--------+------------------+------------------+ CCA Prox  111     23                                                   +----------+--------+--------+--------+------------------+------------------+ CCA Distal79      24                                intimal thickening +----------+--------+--------+--------+------------------+------------------+ ICA Prox  69      24      1-39%                      intimal thickening +----------+--------+--------+--------+------------------+------------------+ ICA Distal72      27                                                   +----------+--------+--------+--------+------------------+------------------+ ECA       127                                                          +----------+--------+--------+--------+------------------+------------------+ +----------+--------+--------+----------------+-------------------+           PSV cm/sEDV cm/sDescribe        Arm Pressure (mmHG) +----------+--------+--------+----------------+-------------------+ Subclavian89              Multiphasic, WNL                    +----------+--------+--------+----------------+-------------------+ +---------+--------+--+--------+--+---------+ VertebralPSV cm/s62EDV cm/s17Antegrade +---------+--------+--+--------+--+---------+     Preliminary     Cardiac Studies   05/12/2019 TTE IMPRESSIONS 1. Left ventricular ejection fraction, by visual estimation, is 40 to 45%. The left ventricle has mild to moderately decreased function. Left ventricular septal wall thickness was severely increased. There is severely increased left ventricular  hypertrophy.  2. Mid and apical anterior wall, mid anteroseptal segment, apex, and basal anteroseptal segment are abnormal.  3. Abnormal septal motion consistent with RV pacemaker.  4. The left ventricle demonstrates regional wall motion abnormalities.  5. Global right ventricle has moderately reduced systolic function.The right ventricular size is normal. No increase in right ventricular wall thickness.  6. Left atrial size was normal.  7. Right atrial size was normal.  8. The mitral valve is grossly normal. Trace mitral valve regurgitation.  9. The tricuspid valve is normal in structure. Tricuspid valve regurgitation is mild. 10. The aortic valve is tricuspid. Aortic valve regurgitation is trivial. Mild  aortic valve sclerosis without stenosis. 11. There is Mild calcification of the aortic valve. 12. There is Mild thickening of the aortic valve. 13. The pulmonic valve was grossly normal. Pulmonic valve regurgitation is not visualized. 14. Aortic dilatation noted. 15. There is mild dilatation of the ascending aorta measuring 40 mm. 16. A pacer wire is visualized in the RA and RV. 17. EF decreased compared to prior. There is mild global hypokinesis and abnormal  septal motion due to RV pacings, but the anterior and anteroseptal walls as noted are hypokinetic/akinetic to the apex. LV thrombus excluded by echo contrast.   09/12/2017 TTE Study Conclusions - Left ventricle: The cavity size was normal. Wall thickness was   increased in a pattern of moderate LVH. There was severe focal   basal hypertrophy of the septum. Systolic function was normal.   The estimated ejection fraction was in the range of 50% to 55%.   Incoordinate septal motion. Doppler parameters are consistent   with abnormal left ventricular relaxation (grade 1 diastolic   dysfunction). The E/e&' ratio is <8, suggesting normal LV filling   pressure. - Aortic valve: Trileaflet. Sclerosis without stenosis. There was   trivial regurgitation. - Ascending aorta: The ascending aorta was mildly dilated at 4.0   cm. - Mitral valve: Mildly thickened leaflets . There was trivial   regurgitation. - Left atrium: The atrium was normal in size. - Right ventricle: Pacer wire or catheter noted in right ventricle. - Right atrium: Pacer wire or catheter noted in right atrium. - Tricuspid valve: There was mild regurgitation. - Pulmonary arteries: PA peak pressure: 26 mm Hg (S). - Inferior vena cava: The vessel was normal in size. The   respirophasic diameter changes were in the normal range (>= 50%),   consistent with normal central venous pressure.  Impressions: - Compared to a prior study in 03/2015, the LVEF is higher at   50-55% with  incoordinate septal motion - pacer wires are noted.   The ascending aorta is mildly dilated at 4.0 cm.  04/09/2015: LVEF 45-50% 07/17/2013 LVEF 45-50%  Patient Profile     84 y.o. male with a hx of AFlutter (ablated remotely), PE remains on warfarin, DCM with recovered LVEF, LBBB, SVT, SSSx w/PPM, DM, HTN, HLD admitted for worsening chest wall, back pain after a syncopal event>>   Found with  Fractures of left sixth through tenth ribs as well as the left third and 8 transverse processes. The left seventh rib is a segmental fracture. 2. Small left hemopneumothorax. 3. Ground-glass opacities and consolidation in the left lower lung likely represent contusion. 4. Ground-glass opacities in the right upper lobe may represent contusion  Assessment & Plan    1. Syncope     Sudden on/off  2. SVT He has known brief SVT's, he did have a 20 second tachycardia at the time of his syncope He has had several others as well, some lasting as long as a 1/2 hour. Though not associated with syncope  3. He has orthostatic hypotension listed in his history     negative orthostatic vitals today     He describes DAILY transient weak spells (does not have daily SVTs)  4. Check carotids     preliminary result without significant disease  5. PPM     Functioning as programmed, lead and battery measurements are good   6. Chest wall trauma     C/w medicine service, no need for surgical intervention by traum service consult     rec pulm toilet, pain management, O2 if need     Remains on his warfarin   7. PAflutter     Longest 23 minutes     On warfarin already w/hx of PE   8. He mentions in the last year a generalized slowing down      He is able to do his usual 4 mile walk though is harder, mentions feeling less motivated?  More fatigued  TTE yesterday with LVEF 40-45%, + WMA (some perhaps 2/2 LBBB, other segments are akinetic, (has had LVEF 45-50% historically, last in 2019 was better  50-55%)      significant LVH (not new, in 2019 though described as severe focal basal hypertrophy of the septum)      He denies any CP, anginal symptoms      Neg HS trop      No findings or symptoms to suggest volume OL   Dr. Caryl Comes will see the patient later today Unclear etiology of his syncope, ? Vagal Dr. Lovena Le had recommended stopping ACE (done) and starting low dose BB as start.  The pt wanted Dr. Caryl Comes to weigh in first  Dr. Caryl Comes will see him later today for final thoughts/recommendations    For questions or updates, please contact [facility] Please consult [email or web address] for contact info under        Signed, Baldwin Jamaica, PA-C  05/13/2019, 10:11 AM     Problem list as above.  Patient interviewed and examined.  Extensive discussions regarding presumed mechanism.  There is temporal correlation between the atrial tachycardia and his syncope.  He has documented orthostatic tachycardia without hypotension, but does have orthostatic lightheadedness frequently.  The presumed mechanism then would be SVT triggering syncope which is accomplished through activation of neurally mediated reflexes.  2 strategies at present themselves to try to mitigate risk.  The first is to prevent the atrial tachycardia and the second is to decrease the likelihood of vasomotor instability with a triggered reflex.  His cardiomyopathy is mild and has been considered nonischemic.  Looking back at Myoview scans from 2006 and 2015 they are quite different.  The latter had been ordered by Dr. Reine Just.  And I never see full clarification.  Hence, flecainide would be inappropriate.  We discussed amiodarone with this long list of side effects.  We will hold off on it.  Dronaderone might also be an option.  We have opted to use propafenone.  It was used safely in the ESVEM trial   We have also discussed strategies to try to minimize orthostatic symptoms including isometric contraction, we will stop his  lisinopril and allow his systolic blood pressure to drift up a little bit.  In the absence of heart failure, we will also liberalize his low-salt diet.  He is advised that he should not drive for about 3 months.  We will be able to follow the frequency of atrial tachycardia through his device.  More than 50% of 40 min was spent in counseling related to the above

## 2019-05-14 ENCOUNTER — Ambulatory Visit (INDEPENDENT_AMBULATORY_CARE_PROVIDER_SITE_OTHER): Payer: Medicare Other | Admitting: *Deleted

## 2019-05-14 DIAGNOSIS — I495 Sick sinus syndrome: Secondary | ICD-10-CM

## 2019-05-14 DIAGNOSIS — S27321A Contusion of lung, unilateral, initial encounter: Secondary | ICD-10-CM

## 2019-05-14 DIAGNOSIS — S2242XA Multiple fractures of ribs, left side, initial encounter for closed fracture: Secondary | ICD-10-CM

## 2019-05-14 DIAGNOSIS — S270XXA Traumatic pneumothorax, initial encounter: Secondary | ICD-10-CM

## 2019-05-14 DIAGNOSIS — Z950 Presence of cardiac pacemaker: Secondary | ICD-10-CM

## 2019-05-14 DIAGNOSIS — J942 Hemothorax: Secondary | ICD-10-CM

## 2019-05-14 DIAGNOSIS — I428 Other cardiomyopathies: Secondary | ICD-10-CM

## 2019-05-14 LAB — CORTISOL: Cortisol, Plasma: 11 ug/dL

## 2019-05-14 LAB — PROTIME-INR
INR: 3.2 — ABNORMAL HIGH (ref 0.8–1.2)
Prothrombin Time: 32.8 seconds — ABNORMAL HIGH (ref 11.4–15.2)

## 2019-05-14 MED ORDER — PROPAFENONE HCL ER 225 MG PO CP12: 225.0000 mg | ORAL_CAPSULE | Freq: Two times a day (BID) | ORAL | 0 refills | Status: DC

## 2019-05-14 MED ORDER — POLYETHYLENE GLYCOL 3350 17 G PO PACK: 17.0000 g | PACK | Freq: Every day | ORAL | 0 refills | Status: DC

## 2019-05-14 MED ORDER — SENNOSIDES-DOCUSATE SODIUM 8.6-50 MG PO TABS: 2.0000 | ORAL_TABLET | Freq: Two times a day (BID) | ORAL | 0 refills | Status: DC | PRN

## 2019-05-14 MED ORDER — WARFARIN SODIUM 5 MG PO TABS: 5.0000 mg | ORAL_TABLET | Freq: Once | ORAL | Status: DC

## 2019-05-14 NOTE — Discharge Summary (Signed)
Physician Discharge Summary  OZRO KLEPACKI [id number] DOB: 1935-02-06 DOA: 05/11/2019  PCP: Crist Infante, MD  Admit date: 05/11/2019 Discharge date: 05/14/2019  Admitted From: Home Disposition: Home  Recommendations for Outpatient Follow-up:  1. Follow up with PCP and cardiology as below. 2. Please obtain CBC/BMP/Mag at follow up 3. Please follow up on the following pending results: None  Home Health: None Equipment/Devices: None  Discharge Condition: Stable CODE STATUS: Full code  Follow-up Information    Crist Infante, MD Follow up.   Specialty: Internal Medicine Why: as needed for rib fracture pain Contact information: [place] [address] [phone]        Deboraha Sprang, MD Follow up.   Specialty: Cardiology Why: 06/25/2019 @ 10:45AM, this will be avirtual visit, you will be called a few days ahead of time to review instructions Contact information: [address] [place] [address] [phone]           HPI: Per Dr. Randa Spike on 05/11/2019 Steven Macdonald is a 84 y.o. 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  View of remote device checks from this year shows that he has known episodes of SVT that last 10 seconds or less.  He has been asymptomatic previously on these and is not on a beta-blocker.  Proximately 8 PM last night after dinner he and his wife are cleaning the kitchen.  He fell abruptly to the floor with no antecedent symptoms.  By the time the wife got him he was asymptomatic.  This occurred in a matter of seconds.  Since that time his back has been progressively hurting more.  He said he slept poorly last night  and took some Tylenol and attempt to get some pain relief.  Due to pain in his back and ribs on his left side he came into the emergency department.  Chest rib x-ray showed a mildly displaced eighth rib fracture however CT scan showed fractures of ribs on the left 6, 7, 8, 9, 10, with a hemopneumothorax and associated pulmonary contusions.  He was seen in consultation by cardiology due to [employer] pacer review showing that he had some SVT yesterday.  While patient was in the ER [employer] called to report that the patient had an episode of SVT during the episode last night.  Neurology has seen the patient and recommended he be admitted for observation for follow-up tomorrow morning by EP team.  With regards to his hemopneumothorax patient was seen by Dr. Rosendo Gros there is did not feel that the patient needed a chest tube.  Recommended admission to medicine service for pulmonary toilet and as needed oxygen.  Both cardiology and trauma surgery will follow the patient while in the hospital.  Patient currently denies fevers, chills, nausea, vomiting, diarrhea, headache, blurry vision, double vision or dizziness.  He does complain of some constipation he usually takes MiraLAX at home and fiber but this has not worked recently.  Hospital Course: Patient admitted for recurrent syncope, SVT and chest wall trauma/multiple rib fractures.  Trauma surgery recommended conservative management with pain control and incentive spirometry.  Interrogation of his pacemaker and evaluation of his telemetry demonstrated runs of SVT/atrial tachycardia.  Electrophysiology did not feel ablation  would be successful as SVTs were not sustained, and suggested medical therapy.  Accordingly, patient was started and discharged on propafenone.  He is on warfarin for anticoagulation.  Echocardiogram on 05/12/2019 with EF of 40 to 45%, abnormal apical/septal motion.  Low-dose lisinopril discontinued due to hypotension.   On the day of  discharge, patient felt well, and cleared by cardiology for discharge home and outpatient follow-up.  He was counseled on safety and symptom recognition, and advised to avoid ladders and driving and liberate his sodium intake.  See individual problem list below for more on hospital course.  Discharge Diagnoses:  Syncope likely due to SVT/atrial tachycardia in patient with PPM.  He is also on sildenafil, Flomax and Cardura which could potentially contribute. -Evaluated by EP and cardiology  -Started and discharged on propafenone  -On Coumadin for anticoagulation.. -Counseling on safety precautions as above. -Outpatient follow-up with his cardiologist.  Nonischemic cardiomyopathy [facility]): Echo with EF of 40 to 45% and apical and septal motion abnormalities thought to be due to PPM. -Advised to librate sodium intake due to hypotension and syncope    Syncopal fall/hemothorax/pneumothorax on left; multiple closed ribs fractures-evaluated by trauma surgery recommended conservative care with pain control incentive spirometry.  BPH without LUTS -Continue Proscar and Flomax. -Discontinued Cardura.  Essential hypertension -Lisinopril discontinued by cardiology due to soft blood pressures  Constipation: Resolved. -Discharged on as needed Senokot, MiraLAX and psyllium.  Discharge Instructions  Discharge Instructions    (HEART FAILURE PATIENTS) Call MD:  Anytime you have any of the following symptoms: 1) 3 pound weight gain in 24 hours or 5 pounds in 1 week 2) shortness of breath, with or without a dry hacking cough 3) swelling in the hands, feet or stomach 4) if you have to sleep on extra pillows at night in order to breathe.   Complete by: As directed    Call MD for:  difficulty breathing, headache or visual disturbances   Complete by: As directed    Call MD for:  extreme fatigue   Complete by: As directed    Call MD for:  persistant dizziness or light-headedness   Complete by: As  directed    Call MD for:  persistant nausea and vomiting   Complete by: As directed    Call MD for:  severe uncontrolled pain   Complete by: As directed    Diet general   Complete by: As directed    Discharge instructions   Complete by: As directed    It has been a pleasure taking care of you! You were hospitalized due to syncope, fall and rib fractures likely from abnormal heart rhythm.  You were started on medication to regulate your heart rhythm.  We have also made some adjustment to your home medications.  Please review your new medication list and the directions before you take your medications. Place follow-up with your primary care doctor and cardiologist as recommended under follow-up section.  Your cardiologist recommended avoiding ladders,  not to avoid sodium (allow some NA+ back into his diet), wearing compression socks and not driving for the next 3 months.   Take care,   Increase activity slowly   Complete by: As directed      Allergies as of 05/14/2019   No Known Allergies     Medication List    STOP taking these medications   COLACE PO   lisinopril 5 MG tablet Commonly known as: ZESTRIL     TAKE these medications   acetaminophen  500 MG tablet Commonly known as: TYLENOL Take 1,000 mg by mouth every 6 (six) hours as needed (pain).   ALIGN PO Take 1 tablet by mouth every morning.   CALCIUM 500 PO Take 1 tablet by mouth every morning.   cholecalciferol 1000 units tablet Commonly known as: VITAMIN D Take 1,000 Units by mouth every morning.   finasteride 5 MG tablet Commonly known as: PROSCAR Take 5 mg by mouth every morning.   fish oil-omega-3 fatty acids 1000 MG capsule Take 1 g by mouth every morning.   mirtazapine 15 MG tablet Commonly known as: REMERON Take 15 mg by mouth at bedtime.   polyethylene glycol 17 g packet Commonly known as: MIRALAX / GLYCOLAX Take 17 g by mouth daily as needed (constipation). What changed: Another medication  with the same name was added. Make sure you understand how and when to take each.   polyethylene glycol 17 g packet Commonly known as: MIRALAX / GLYCOLAX Take 17 g by mouth daily. What changed: You were already taking a medication with the same name, and this prescription was added. Make sure you understand how and when to take each.   PRESCRIPTION MEDICATION Apply 1 application topically See admin instructions. Dermatological intment for wound care - apply to tailbone daily as needed for sores   propafenone 225 MG 12 hr capsule Commonly known as: RYTHMOL SR Take 1 capsule (225 mg total) by mouth every 12 (twelve) hours.   PSYLLIUM PO Take 10 mLs by mouth daily before breakfast. Mix in 8 oz liquid and drink   senna-docusate 8.6-50 MG tablet Commonly known as: Senokot-S Take 2 tablets by mouth 2 (two) times daily as needed for mild constipation.   sildenafil 20 MG tablet Commonly known as: REVATIO Take 100 mg by mouth daily as needed (erectile dysfunction).   tamsulosin 0.4 MG Caps capsule Commonly known as: FLOMAX Take 0.4 mg by mouth every morning.   VITAMIN B-12 PO Take 1 tablet by mouth every morning.   warfarin 5 MG tablet Commonly known as: COUMADIN Take 7.5-10 mg by mouth See admin instructions. Take 2 tablets (10 mg) by mouth on Monday, Wednesday, Friday night, take 1 1 /2 tablets (7.5 mg) on Sunday, Tuesday, Thursday, Saturday night       Consultations:  Trauma surgery  Cardiology  Electrophysiology  Procedures/Studies:  2D Echo on 11/23  1. Left ventricular ejection fraction, by visual estimation, is 40 to 45%. The left ventricle has mild to moderately decreased function. Left ventricular septal wall thickness was severely increased. There is severely increased left ventricular  hypertrophy.  2. Mid and apical anterior wall, mid anteroseptal segment, apex, and basal anteroseptal segment are abnormal.  3. Abnormal septal motion consistent with RV  pacemaker.  4. The left ventricle demonstrates regional wall motion abnormalities.  5. Global right ventricle has moderately reduced systolic function.The right ventricular size is normal. No increase in right ventricular wall thickness.  6. Left atrial size was normal.  7. Right atrial size was normal.  8. The mitral valve is grossly normal. Trace mitral valve regurgitation.  9. The tricuspid valve is normal in structure. Tricuspid valve regurgitation is mild. 10. The aortic valve is tricuspid. Aortic valve regurgitation is trivial. Mild aortic valve sclerosis without stenosis. 11. There is Mild calcification of the aortic valve. 12. There is Mild thickening of the aortic valve. 13. The pulmonic valve was grossly normal. Pulmonic valve regurgitation is not visualized. 14. Aortic dilatation noted. 15. There is mild dilatation  of the ascending aorta measuring 40 mm. 16. A pacer wire is visualized in the RA and RV. 17. EF decreased compared to prior. There is mild global hypokinesis and abnormal septal motion due to RV pacings, but the anterior and anteroseptal walls as noted are hypokinetic/akinetic to the apex. LV thrombus excluded by echo contrast.  X-ray Chest Pa And Lateral  Result Date: 05/12/2019 CLINICAL DATA:  Pneumothorax. EXAM: CHEST - 2 VIEW COMPARISON:  May 11, 2019. FINDINGS: The heart size and mediastinal contours are within normal limits. Right-sided pacemaker is unchanged in position. Minimal bibasilar subsegmental atelectasis is noted. No pneumothorax is noted. Small left pleural effusion may be present. Mildly displaced left eighth rib fracture is again noted. IMPRESSION: Minimal bibasilar subsegmental atelectasis. Small left pleural effusion may be present. Stable mildly displaced left eighth rib fracture is noted. Electronically Signed   By: Marijo Conception M.D.   On: 05/12/2019 08:00   Dg Chest 2 View  Result Date: 05/11/2019 CLINICAL DATA:  Pt had syncopal episode  yesterday. States he fell and hit back. Reports + LOC for a few seconds. C/o L sided chest pain since fall, mild sob. Denies nausea and vomiting. EXAM: CHEST - 2 VIEW COMPARISON:  None. FINDINGS: Mildly displaced fracture of the lateral left eighth rib. No other visualized fractures. No pneumothorax or pleural effusion. There is linear opacity at the lung bases, greater on the left, consistent with atelectasis. Remainder of the lungs is clear. Cardiac silhouette is mildly enlarged. No mediastinal or hilar masses. Stable right anterior chest wall pacemaker. IMPRESSION: 1. Mildly displaced fracture of the lateral left eighth rib. No pneumothorax. 2. Mild lung base atelectasis, left greater than right. Electronically Signed   By: Lajean Manes M.D.   On: 05/11/2019 13:11   Ct Head Wo Contrast  Result Date: 05/11/2019 CLINICAL DATA:  Syncopal episode with a fall from standing. Head and neck pain. EXAM: CT HEAD WITHOUT CONTRAST CT CERVICAL SPINE WITHOUT CONTRAST TECHNIQUE: Multidetector CT imaging of the head and cervical spine was performed following the standard protocol without intravenous contrast. Multiplanar CT image reconstructions of the cervical spine were also generated. COMPARISON:  None. FINDINGS: CT HEAD FINDINGS Brain: No evidence of acute infarction, hemorrhage, hydrocephalus, extra-axial collection or mass lesion/mass effect. There is mild cerebral volume loss with associated ex vacuo dilatation. Periventricular white matter hypoattenuation likely represents chronic small vessel ischemic disease. Vascular: There are vascular calcifications in the carotid siphons. Skull: Normal. Negative for fracture or focal lesion. Sinuses/Orbits: There is bilateral maxillary, bilateral ethmoid, and right frontal sinus disease. Other: None. CT CERVICAL SPINE FINDINGS Alignment: Normal. Skull base and vertebrae: No acute fracture. No primary bone lesion or focal pathologic process. Soft tissues and spinal canal: No  prevertebral fluid or swelling. No visible canal hematoma. Disc levels: Multilevel degenerative disc and joint disease results in up to severe neuroforaminal stenosis and moderate to severe central canal stenosis. Upper chest: A left pneumothorax is partially imaged. 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 IMPRESSION: 1. No acute intracranial process. 2. No acute osseous injury in the cervical spine. 3. Partially imaged left pneumothorax. Electronically Signed   By: Zerita Boers M.D.   On: 05/11/2019 15:07   Ct Chest W Contrast  Addendum Date: 05/11/2019   ADDENDUM REPORT: 05/11/2019 15:51 ADDENDUM: In the FINDINGS section, the report should note that the left eighth rib fracture is displaced and overriding. Critical Value/emergent results were called by telephone at  the time of interpretation on 05/11/2019 at 3:49 pm to [place] , who verbally acknowledged these results. Electronically Signed   By: Zerita Boers M.D.   On: 05/11/2019 15:51   Result Date: 05/11/2019 CLINICAL DATA:  Syncopal episode with a fall. EXAM: CT CHEST WITH CONTRAST TECHNIQUE: Multidetector CT imaging of the chest was performed during intravenous contrast administration. CONTRAST:  58mL OMNIPAQUE IOHEXOL 300 MG/ML  SOLN COMPARISON:  Chest radiograph from the same day and CT chest dated 07/26/2017. FINDINGS: Cardiovascular: The ascending aorta measures 4.0 cm in diameter, not significantly changed since 11/25/2018 vascular calcifications are seen in the aortic arch. The heart is enlarged. No pericardial effusion. A right chest cardiac device has leads terminating in the right atrium and right ventricle. Mediastinum/Nodes: No enlarged mediastinal, hilar, or axillary lymph nodes. Thyroid gland, trachea, and esophagus demonstrate no significant findings. Lungs/Pleura: There is a small left hemopneumothorax. Ground-glass opacities and consolidation in the left lower  lung likely represent contusion. Ground-glass opacities in the right upper lobe may represent contusion. There is mild dependent atelectasis in the right lower lobe. There is no right pleural effusion or pneumothorax. Upper Abdomen: No acute abnormality. Musculoskeletal: There fractures of the left third and 8 transverse processes. There fractures of the posteromedial left sixth, seventh, and tenth ribs as well as the lateral seventh through ninth ribs. IMPRESSION: 1. Fractures of left sixth through tenth ribs as well as the left third and 8 transverse processes. The left seventh rib is a segmental fracture. 2. Small left hemopneumothorax. 3. Ground-glass opacities and consolidation in the left lower lung likely represent contusion. 4. Ground-glass opacities in the right upper lobe may represent contusion. Initial follow-up with CT at 6-12 months is recommended to confirm persistence. If persistent, repeat CT is recommended every 2 years until 5 years of stability has been established. This recommendation follows the consensus statement: Guidelines for Management of Incidental Pulmonary Nodules Detected on CT Images: From the Fleischner Society 2017; Radiology 2017; 284:228-243. 5. Unchanged ascending aortic aneurysm measuring 4.0 cm. Recommend semi-annual imaging followup by CTA or MRA and referral to cardiothoracic surgery if not already obtained. This recommendation follows 2010 ACCF/AHA/AATS/ACR/ASA/SCA/SCAI/SIR/STS/SVM Guidelines for the Diagnosis and Management of Patients With Thoracic Aortic Disease. Circulation. 2010; 121GL:6099015. Aortic aneurysm NOS (ICD10-I71.9) Aortic Atherosclerosis (ICD10-I70.0). Aortic aneurysm NOS (ICD10-I71.9). Electronically Signed: By: Zerita Boers M.D. On: 05/11/2019 15:25   Ct Cervical Spine Wo Contrast  Result Date: 05/11/2019 CLINICAL DATA:  Syncopal episode with a fall from standing. Head and neck pain. EXAM: CT HEAD WITHOUT CONTRAST CT CERVICAL SPINE WITHOUT CONTRAST  TECHNIQUE: Multidetector CT imaging of the head and cervical spine was performed following the standard protocol without intravenous contrast. Multiplanar CT image reconstructions of the cervical spine were also generated. COMPARISON:  None. FINDINGS: CT HEAD FINDINGS Brain: No evidence of acute infarction, hemorrhage, hydrocephalus, extra-axial collection or mass lesion/mass effect. There is mild cerebral volume loss with associated ex vacuo dilatation. Periventricular white matter hypoattenuation likely represents chronic small vessel ischemic disease. Vascular: There are vascular calcifications in the carotid siphons. Skull: Normal. Negative for fracture or focal lesion. Sinuses/Orbits: There is bilateral maxillary, bilateral ethmoid, and right frontal sinus disease. Other: None. CT CERVICAL SPINE FINDINGS Alignment: Normal. Skull base and vertebrae: No acute fracture. No primary bone lesion or focal pathologic process. Soft tissues and spinal canal: No prevertebral fluid or swelling. No visible canal hematoma. Disc levels: Multilevel degenerative disc and joint disease results in up to severe neuroforaminal stenosis and moderate  to severe central canal stenosis. Upper chest: A left pneumothorax is partially imaged. 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 IMPRESSION: 1. No acute intracranial process. 2. No acute osseous injury in the cervical spine. 3. Partially imaged left pneumothorax. Electronically Signed   By: Zerita Boers M.D.   On: 05/11/2019 15:07   Vas US Carotid  Result Date: 05/13/2019 Carotid Arterial Duplex Study Indications:       Syncope and Chest pain. Comparison Study:  No priors. Performing Technologist: Oda Cogan RDMS, RVT  Examination Guidelines: A complete evaluation includes B-mode imaging, spectral Doppler, color Doppler, and power Doppler as needed of all accessible portions of each vessel. Bilateral testing is considered an  integral part of a complete examination. Limited examinations for reoccurring indications may be performed as noted.  Right Carotid Findings: +----------+--------+--------+--------+------------------+------------------+             PSV cm/s EDV cm/s Stenosis Plaque Description Comments            +----------+--------+--------+--------+------------------+------------------+  CCA Prox   100      19                                                       +----------+--------+--------+--------+------------------+------------------+  CCA Distal 72       22                                   intimal thickening  +----------+--------+--------+--------+------------------+------------------+  ICA Prox   72       25       1-39%                       intimal thickening  +----------+--------+--------+--------+------------------+------------------+  ICA Mid    59       22                                                       +----------+--------+--------+--------+------------------+------------------+  ICA Distal 69       27                                                       +----------+--------+--------+--------+------------------+------------------+  ECA        116      13                                                       +----------+--------+--------+--------+------------------+------------------+ +----------+--------+-------+----------------+-------------------+             PSV cm/s EDV cms Describe         Arm Pressure (mmHG)  +----------+--------+-------+----------------+-------------------+  Subclavian 94               Multiphasic, WNL                      +----------+--------+-------+----------------+-------------------+ +---------+--------+--+--------+--+---------+  Vertebral PSV cm/s 40 EDV cm/s 16 Antegrade  +---------+--------+--+--------+--+---------+  Left Carotid Findings: +----------+--------+--------+--------+------------------+------------------+             PSV cm/s EDV cm/s Stenosis Plaque  Description Comments            +----------+--------+--------+--------+------------------+------------------+  CCA Prox   111      23                                                       +----------+--------+--------+--------+------------------+------------------+  CCA Distal 79       24                                   intimal thickening  +----------+--------+--------+--------+------------------+------------------+  ICA Prox   69       24       1-39%                       intimal thickening  +----------+--------+--------+--------+------------------+------------------+  ICA Distal 72       27                                                       +----------+--------+--------+--------+------------------+------------------+  ECA        127                                                               +----------+--------+--------+--------+------------------+------------------+ +----------+--------+--------+----------------+-------------------+             PSV cm/s EDV cm/s Describe         Arm Pressure (mmHG)  +----------+--------+--------+----------------+-------------------+  Subclavian 89                Multiphasic, WNL                      +----------+--------+--------+----------------+-------------------+ +---------+--------+--+--------+--+---------+  Vertebral PSV cm/s 62 EDV cm/s 17 Antegrade  +---------+--------+--+--------+--+---------+  Summary: Right Carotid: Velocities in the right ICA are consistent with a 1-39% stenosis. Left Carotid: Velocities in the left ICA are consistent with a 1-39% stenosis. Vertebrals:  Bilateral vertebral arteries demonstrate antegrade flow. Subclavians: Normal flow hemodynamics were seen in bilateral subclavian              arteries. *See table(s) above for measurements and observations.  Electronically signed by Antony Contras MD on 05/13/2019 at 10:44:55 AM.    Final        Discharge Exam: Vitals:   05/13/19 2052 05/14/19 0605  BP:  133/77  Pulse:  60  Resp:  18    Temp: 97.9 F (36.6 C) 98.1 F (36.7 C)  SpO2: 98% 95%    GENERAL: No acute distress.  Appears well.  HEENT: MMM.  Vision and hearing grossly intact.  NECK: Supple.  No apparent JVD.  RESP:  No IWOB. Good air movement bilaterally.  Over 2000cc on IS  CVS:  RRR. Heart sounds normal.  ABD/GI/GU: Bowel sounds present. Soft. Non tender.  MSK/EXT:  Moves extremities. No apparent deformity or edema.  SKIN: no apparent skin lesion or wound NEURO: Awake, alert and oriented appropriately.  No gross deficit.  PSYCH: Calm. Normal affect.    The results of significant diagnostics from this hospitalization (including imaging, microbiology, ancillary and laboratory) are listed below for reference.     Microbiology: Recent Results (from the past 240 hour(s))  SARS CORONAVIRUS 2 (TAT 6-24 HRS) Nasopharyngeal Nasopharyngeal Swab     Status: None   Collection Time: 05/11/19  4:02 PM   Specimen: Nasopharyngeal Swab  Result Value Ref Range Status   SARS Coronavirus 2 NEGATIVE NEGATIVE Final    Comment: (NOTE) SARS-CoV-2 target nucleic acids are NOT DETECTED. The SARS-CoV-2 RNA is generally detectable in upper and lower respiratory specimens during the acute phase of infection. Negative results do not preclude SARS-CoV-2 infection, do not rule out co-infections with other pathogens, and should not be used as the sole basis for treatment or other patient management decisions. Negative results must be combined with clinical observations, patient history, and epidemiological information. The expected result is Negative. Fact Sheet for Patients: SugarRoll.be Fact Sheet for Healthcare Providers: https://www.woods-mathews.com/ This test is not yet approved or cleared by the [place] FDA and  has been authorized for detection and/or diagnosis of SARS-CoV-2 by FDA under an Emergency Use Authorization (EUA). This EUA will remain  in effect (meaning this  test can be used) for the duration of the COVID-19 declaration under Section 56 4(b)(1) of the Act, 21 U.S.C. section 360bbb-3(b)(1), unless the authorization is terminated or revoked sooner. Performed at [facility], [place] [address] [place], [place] [address]      Labs: BNP (last 3 results) Recent Labs    05/11/19 1513  BNP 0000000   Basic Metabolic Panel: Recent Labs  Lab 05/11/19 1248 05/11/19 1356 05/12/19 0452  NA 136  --  138  K 4.0  --  4.0  CL 103  --  106  CO2 26  --  26  GLUCOSE 100*  --  99  BUN 18  --  13  CREATININE 1.02  --  0.99  CALCIUM 9.9  --  9.2  MG  --  2.1  --    Liver Function Tests: Recent Labs  Lab 05/11/19 1356  AST 27  ALT 20  ALKPHOS 54  BILITOT 1.8*  PROT 6.8  ALBUMIN 3.8   No results for input(s): LIPASE, AMYLASE in the last 168 hours. No results for input(s): AMMONIA in the last 168 hours. CBC: Recent Labs  Lab 05/11/19 1248 05/12/19 0452 05/13/19 0416  WBC 8.5 7.1 5.9  HGB 15.5 14.4 14.2  HCT 47.1 43.0 44.2  MCV 87.7 87.9 88.6  PLT 122* PLATELET CLUMPS NOTED ON SMEAR, UNABLE TO ESTIMATE 122*   Cardiac Enzymes: No results for input(s): CKTOTAL, CKMB, CKMBINDEX, TROPONINI in the last 168 hours. BNP: Invalid input(s): POCBNP CBG: No results for input(s): GLUCAP in the last 168 hours. D-Dimer No results for input(s): DDIMER in the last 72 hours. Hgb A1c No results for input(s): HGBA1C in the last 72 hours. Lipid Profile No results for input(s): CHOL, HDL, LDLCALC, TRIG, CHOLHDL, LDLDIRECT in the last 72 hours. Thyroid function studies No results for input(s): TSH, T4TOTAL, T3FREE, THYROIDAB in the last 72 hours.  Invalid input(s): FREET3 Anemia work up No results for input(s): VITAMINB12, FOLATE, FERRITIN, TIBC, IRON, RETICCTPCT in  the last 72 hours. Urinalysis    Component Value Date/Time   COLORURINE STRAW (A) 03/28/2017 0045   APPEARANCEUR CLEAR 03/28/2017 0045   LABSPEC 1.004 (L) 03/28/2017 0045    PHURINE 6.0 03/28/2017 0045   GLUCOSEU NEGATIVE 03/28/2017 0045   HGBUR NEGATIVE 03/28/2017 0045   BILIRUBINUR NEGATIVE 03/28/2017 0045   KETONESUR NEGATIVE 03/28/2017 0045   PROTEINUR NEGATIVE 03/28/2017 0045   UROBILINOGEN 0.2 12/25/2006 0236   NITRITE NEGATIVE 03/28/2017 0045   LEUKOCYTESUR NEGATIVE 03/28/2017 0045   Sepsis Labs Invalid input(s): PROCALCITONIN,  WBC,  LACTICIDVEN   Time coordinating discharge: 35 minutes  SIGNED:  Mercy Riding, MD  [facility] 05/14/2019, 10:39 AM  If 7PM-7AM, please contact night-coverage [email or web address] Password [email or web address]

## 2019-05-14 NOTE — Progress Notes (Addendum)
Progress Note  Patient Name: Steven Macdonald Date of Encounter: 05/14/2019  Primary Cardiologist: Dr. Caryl Comes  Subjective   Had BM yesterday after the enema, is more comfortable, no CP, palpitations or SOB  Inpatient Medications    Scheduled Meds: . acetaminophen  1,000 mg Oral Q6H  . cholecalciferol  1,000 Units Oral q morning - 10a  . finasteride  5 mg Oral q morning - 10a  . mirtazapine  15 mg Oral QHS  . omega-3 acid ethyl esters  1 g Oral Daily  . polyethylene glycol  17 g Oral Daily  . propafenone  225 mg Oral Q12H  . psyllium  1 packet Oral Daily  . senna-docusate  2 tablet Oral BID  . sodium chloride flush  3 mL Intravenous Once  . sodium chloride flush  3 mL Intravenous Q12H  . tamsulosin  0.4 mg Oral q morning - 10a  . vitamin B-12  100 mcg Oral q morning - 10a  . warfarin  5 mg Oral ONCE-1800  . Warfarin - Pharmacist Dosing Inpatient   Does not apply q1800   Continuous Infusions: . sodium chloride     PRN Meds: sodium chloride, ketorolac, sodium chloride flush   Vital Signs    Vitals:   05/13/19 1300 05/13/19 1925 05/13/19 2052 05/14/19 0605  BP: (!) 150/104 122/77  133/77  Pulse: 68   60  Resp: 19   18  Temp: 97.6 F (36.4 C)  97.9 F (36.6 C) 98.1 F (36.7 C)  TempSrc: Oral  Oral Oral  SpO2:   98% 95%  Weight:    78 kg  Height:        Intake/Output Summary (Last 24 hours) at 05/14/2019 0910 Last data filed at 05/14/2019 0858 Gross per 24 hour  Intake 480 ml  Output -  Net 480 ml   Last 3 Weights 05/14/2019 05/13/2019 05/12/2019  Weight (lbs) 172 lb 171 lb 12.8 oz 172 lb  Weight (kg) 78.019 kg 77.928 kg 78.019 kg      Telemetry    SR, no recurrent SVT - Personally Reviewed  ECG    No new EKGs - Personally Reviewed  Physical Exam   GEN: No acute distress.   Neck: No JVD Cardiac: RRR, no murmurs, rubs, or gallops.  Respiratory: CTA b/l. GI: Soft, nontender, non-distended  MS: No edema; No deformity. Neuro:  Nonfocal  Psych:  Normal affect   Labs    High Sensitivity Troponin:   Recent Labs  Lab 05/11/19 1248 05/11/19 1513  TROPONINIHS 14 15      Chemistry Recent Labs  Lab 05/11/19 1248 05/11/19 1356 05/12/19 0452  NA 136  --  138  K 4.0  --  4.0  CL 103  --  106  CO2 26  --  26  GLUCOSE 100*  --  99  BUN 18  --  13  CREATININE 1.02  --  0.99  CALCIUM 9.9  --  9.2  PROT  --  6.8  --   ALBUMIN  --  3.8  --   AST  --  27  --   ALT  --  20  --   ALKPHOS  --  54  --   BILITOT  --  1.8*  --   GFRNONAA >60  --  >60  GFRAA >60  --  >60  ANIONGAP 7  --  6     Hematology Recent Labs  Lab 05/11/19 1248 05/12/19 0452 05/13/19 0416  WBC 8.5 7.1 5.9  RBC 5.37 4.89 4.99  HGB 15.5 14.4 14.2  HCT 47.1 43.0 44.2  MCV 87.7 87.9 88.6  MCH 28.9 29.4 28.5  MCHC 32.9 33.5 32.1  RDW 13.7 13.9 13.8  PLT 122* PLATELET CLUMPS NOTED ON SMEAR, UNABLE TO ESTIMATE 122*    BNP Recent Labs  Lab 05/11/19 1513  BNP 57.0     DDimer No results for input(s): DDIMER in the last 168 hours.   Radiology     Vas US Carotid Result Date: 05/12/2019 Summary: Right Carotid: Velocities in the right ICA are consistent with a 1-39% stenosis.  Left Carotid: Velocities in the left ICA are consistent with a 1-39% stenosis.  Vertebrals:  Bilateral vertebral arteries demonstrate antegrade flow. Subclavians: Normal flow hemodynamics were seen in bilateral subclavian              arteries.  Cardiac Studies   05/12/2019 TTE IMPRESSIONS 1. Left ventricular ejection fraction, by visual estimation, is 40 to 45%. The left ventricle has mild to moderately decreased function. Left ventricular septal wall thickness was severely increased. There is severely increased left ventricular  hypertrophy.  2. Mid and apical anterior wall, mid anteroseptal segment, apex, and basal anteroseptal segment are abnormal.  3. Abnormal septal motion consistent with RV pacemaker.  4. The left ventricle demonstrates regional wall  motion abnormalities.  5. Global right ventricle has moderately reduced systolic function.The right ventricular size is normal. No increase in right ventricular wall thickness.  6. Left atrial size was normal.  7. Right atrial size was normal.  8. The mitral valve is grossly normal. Trace mitral valve regurgitation.  9. The tricuspid valve is normal in structure. Tricuspid valve regurgitation is mild. 10. The aortic valve is tricuspid. Aortic valve regurgitation is trivial. Mild aortic valve sclerosis without stenosis. 11. There is Mild calcification of the aortic valve. 12. There is Mild thickening of the aortic valve. 13. The pulmonic valve was grossly normal. Pulmonic valve regurgitation is not visualized. 14. Aortic dilatation noted. 15. There is mild dilatation of the ascending aorta measuring 40 mm. 16. A pacer wire is visualized in the RA and RV. 17. EF decreased compared to prior. There is mild global hypokinesis and abnormal septal motion due to RV pacings, but the anterior and anteroseptal walls as noted are hypokinetic/akinetic to the apex. LV thrombus excluded by echo contrast.   09/12/2017 TTE Study Conclusions - Left ventricle: The cavity size was normal. Wall thickness was   increased in a pattern of moderate LVH. There was severe focal   basal hypertrophy of the septum. Systolic function was normal.   The estimated ejection fraction was in the range of 50% to 55%.   Incoordinate septal motion. Doppler parameters are consistent   with abnormal left ventricular relaxation (grade 1 diastolic   dysfunction). The E/e&' ratio is <8, suggesting normal LV filling   pressure. - Aortic valve: Trileaflet. Sclerosis without stenosis. There was   trivial regurgitation. - Ascending aorta: The ascending aorta was mildly dilated at 4.0   cm. - Mitral valve: Mildly thickened leaflets . There was trivial   regurgitation. - Left atrium: The atrium was normal in size. - Right ventricle:  Pacer wire or catheter noted in right ventricle. - Right atrium: Pacer wire or catheter noted in right atrium. - Tricuspid valve: There was mild regurgitation. - Pulmonary arteries: PA peak pressure: 26 mm Hg (S). - Inferior vena cava: The vessel was normal in size. The  respirophasic diameter changes were in the normal range (>= 50%),   consistent with normal central venous pressure.  Impressions: - Compared to a prior study in 03/2015, the LVEF is higher at   50-55% with incoordinate septal motion - pacer wires are noted.   The ascending aorta is mildly dilated at 4.0 cm.  04/09/2015: LVEF 45-50% 07/17/2013 LVEF 45-50%  Patient Profile     83 y.o. male with a hx of AFlutter (ablated remotely), PE remains on warfarin, DCM with recovered LVEF, LBBB, SVT, SSSx w/PPM, DM, HTN, HLD admitted for worsening chest wall, back pain after a syncopal event>>   Found with  Fractures of left sixth through tenth ribs as well as the left third and 8 transverse processes. The left seventh rib is a segmental fracture. 2. Small left hemopneumothorax. 3. Ground-glass opacities and consolidation in the left lower lung likely represent contusion. 4. Ground-glass opacities in the right upper lobe may represent contusion  Assessment & Plan    1. Syncope     suspect to be orthostatic  Dr. Caryl Comes discussed safety, avoiding ladders, symptom recognition, not to avoid sodium (allow some NA+ back into his diet) Discussed support/compression wear No driving for now (3 months was discussed with the patient)   2. SVT, ATach He has known brief SVT's, he did have a 20 second tachycardia at the time of his syncope He has had several others as well, some lasting as long as a 1/2 hour. Though not associated with syncope  Started on Propafenone last night   3. He has orthostatic hypotension listed in his history     most notably rapid rise in HR with standing, suggestive of orthostatic etiology     He  describes DAILY transient weak spells (does not have daily SVTs)     ACE stopped, BP looks OK  4. Check carotids     no significant disease  5. PPM     Functioning as programmed, lead and battery measurements are good   6. Chest wall trauma     C/w medicine service, no need for surgical intervention by traum service consult     rec pulm toilet, pain management, O2 if need     Remains on his warfarin    7. PAflutter     Longest 23 minutes     On warfarin already w/hx of PE   8. He mentions in the last year a generalized slowing down      He is able to do his usual 4 mile walk though is harder, mentions feeling less motivated?  More fatigued      TTE yesterday with LVEF 40-45%, + WMA (some perhaps 2/2 LBBB, other segments are akinetic, (has had LVEF 45-50% historically, last in 2019 was better 50-55%)      significant LVH (not new, in 2019 though described as severe focal basal hypertrophy of the septum)      He denies any CP, anginal symptoms      Neg HS trop      No findings or symptoms to suggest volume OL   Given orthostatic symptoms and syncope, no meds for now    9. Constipation     A progressive problem for him of late     Defer to medicine, PMD     Pt inquired about adrenal insufficiency, cortisol level was ordered, though defer to PMD    OK to discharge from our perspective, no ACE for now Follow up with Dr. Caryl Comes is  in place    For questions or updates, please contact [place] Please consult [email or web address] for contact info under        Signed, Baldwin Jamaica, PA-C  05/14/2019, 9:10 AM     As above  Reiterated issues with wife and patient  More than 50% of 25 min was spent in counseling related to the above

## 2019-05-14 NOTE — Discharge Instructions (Signed)
NO DRIVING 3 MONTHS OR UNTIL CLEARED BY DR. Caryl Comes    Rib Fracture  A rib fracture is a break or crack in one of the bones of the ribs. The ribs are like a cage that goes around your upper chest. A broken or cracked rib is often painful, but most do not cause other problems. Most rib fractures usually heal on their own in 1-3 months. Follow these instructions at home: Managing pain, stiffness, and swelling  If directed, apply ice to the injured area. ? Put ice in a plastic bag. ? Place a towel between your skin and the bag. ? Leave the ice on for 20 minutes, 2-3 times a day.  Take over-the-counter and prescription medicines only as told by your doctor. Activity  Avoid activities that cause pain to the injured area. Protect your injured area.  Slowly increase activity as told by your doctor. General instructions  Do deep breathing as told by your doctor. You may be told to: ? Take deep breaths many times a day. ? Cough many times a day while hugging a pillow. ? Use a device (incentive spirometer) to do deep breathing many times a day.  Drink enough fluid to keep your pee (urine) clear or pale yellow.  Do not wear a rib belt or binder. These do not allow you to breathe deeply.  Keep all follow-up visits as told by your doctor. This is important. Contact a doctor if:  You have a fever. Get help right away if:  You have trouble breathing.  You are short of breath.  You cannot stop coughing.  You cough up thick or bloody spit (sputum).  You feel sick to your stomach (nauseous), throw up (vomit), or have belly (abdominal) pain.  Your pain gets worse and medicine does not help. Summary  A rib fracture is a break or crack in one of the bones of the ribs.  Apply ice to the injured area and take medicines for pain as told by your doctor.  Take deep breaths and cough many times a day. Hug a pillow every time you cough. This information is not intended to replace advice  given to you by your health care provider. Make sure you discuss any questions you have with your health care provider. Document Released: 03/14/2008 Document Revised: 05/18/2017 Document Reviewed: 09/05/2016 Elsevier Patient Education  2020 [employer].

## 2019-05-14 NOTE — Progress Notes (Signed)
[place] for warfarin Indication: atrial fibrillation   Assessment: 37 yom on warfarin PTA for hx PE, afib presenting with rib fractures s/p fall. Pharmacy consulted to dose warfarin inpatient. INR therapeutic on admit, now slightly above goal at 3.2.   PTA warfarin dose: 7.5mg  daily except 10mg  on MWF (last dose 05/10/19 PTA)  Goal of Therapy:  INR 2-3 Monitor platelets by anticoagulation protocol: Yes   Plan:  Warfarin 5mg  PO tonight - will give reduced dose with higher INR Daily protime   Arrie Senate, PharmD, BCPS Clinical Pharmacist [phone] Please check AMION for all [place] numbers 05/14/2019

## 2019-05-17 LAB — CUP PACEART REMOTE DEVICE CHECK
Battery Remaining Longevity: 64 mo
Battery Voltage: 3 V
Brady Statistic AP VP Percent: 0.27 %
Brady Statistic AP VS Percent: 75.02 %
Brady Statistic AS VP Percent: 0.01 %
Brady Statistic AS VS Percent: 24.69 %
Brady Statistic RA Percent Paced: 74.76 %
Brady Statistic RV Percent Paced: 0.31 %
Date Time Interrogation Session: 20201127102352
Implantable Lead Implant Date: 20060223
Implantable Lead Implant Date: 20060223
Implantable Lead Location: 753859
Implantable Lead Location: 753860
Implantable Lead Model: 5076
Implantable Lead Model: 5076
Implantable Pulse Generator Implant Date: 20151125
Lead Channel Impedance Value: 361 Ohm
Lead Channel Impedance Value: 418 Ohm
Lead Channel Impedance Value: 475 Ohm
Lead Channel Impedance Value: 475 Ohm
Lead Channel Pacing Threshold Amplitude: 0.625 V
Lead Channel Pacing Threshold Amplitude: 0.75 V
Lead Channel Pacing Threshold Pulse Width: 0.4 ms
Lead Channel Pacing Threshold Pulse Width: 0.4 ms
Lead Channel Sensing Intrinsic Amplitude: 2.625 mV
Lead Channel Sensing Intrinsic Amplitude: 2.625 mV
Lead Channel Sensing Intrinsic Amplitude: 6.25 mV
Lead Channel Sensing Intrinsic Amplitude: 6.25 mV
Lead Channel Setting Pacing Amplitude: 2 V
Lead Channel Setting Pacing Amplitude: 2.5 V
Lead Channel Setting Pacing Pulse Width: 0.4 ms
Lead Channel Setting Sensing Sensitivity: 2.8 mV

## 2019-05-20 ENCOUNTER — Other Ambulatory Visit: Payer: Self-pay

## 2019-05-20 DIAGNOSIS — R55 Syncope and collapse: Secondary | ICD-10-CM

## 2019-05-20 NOTE — Consult Note (Signed)
   [facility] CM Inpatient Consult   05/20/2019  FAREED TOWRY Nov 12, 1934 [id number]  Received notification of patient with Red score EMMI follow up.  However, patient has already transition home by the time of this review in the Medicare Next Gen ACO.    Plan:  Will have patient assign for [place] Management follow up.  Natividad Brood, RN BSN [facility] Liaison  [phone] business mobile phone Toll free office [phone]  Fax number: [phone] [email or web address]

## 2019-05-21 ENCOUNTER — Encounter: Payer: Self-pay | Admitting: *Deleted

## 2019-05-21 ENCOUNTER — Other Ambulatory Visit: Payer: Self-pay | Admitting: *Deleted

## 2019-05-21 NOTE — Patient Outreach (Signed)
Transition of care call, initial outreach successful.  Mr. Menna is home with his wife and doing quite well. He has not had any cardiac or musculoskeletal problems since he came home. He understands his discharge orders. He DOES NOT have follow up appts within the desired time after hospitalization. He has all his meds and is taking them as ordered. He is walking 2 miles a day, using his incentive spirometer several times a day.   He did have a red flag on his hospital discharge call for depression which today he reports, yes, he is a little bit more down because of this incident but is on treatment. We did discuss that it is possible to increase his mirtazapine if he did not feel he was getting benefits from current dose of 15 mg daily. Advised he should talk to Dr. Joylene Draft about this.  Pt agrees to care management services. I will call him weekly over the next month. Advised to keep my number and to call me if any problems or questions. Sending pt welcome letter and MD barriers letter. I will call him again next week.  Patient was recently discharged from hospital and all medications have been reviewed. Outpatient Encounter Medications as of 05/21/2019  Medication Sig  . acetaminophen (TYLENOL) 500 MG tablet Take 1,000 mg by mouth every 6 (six) hours as needed (pain).  . Calcium-Magnesium-Vitamin D (CALCIUM 500 PO) Take 1 tablet by mouth every morning.   . cholecalciferol (VITAMIN D) 1000 UNITS tablet Take 1,000 Units by mouth every morning.   . Cyanocobalamin (VITAMIN B-12 PO) Take 1 tablet by mouth every morning.  . finasteride (PROSCAR) 5 MG tablet Take 5 mg by mouth every morning.   . fish oil-omega-3 fatty acids 1000 MG capsule Take 1 g by mouth every morning.   . mirtazapine (REMERON) 15 MG tablet Take 15 mg by mouth at bedtime.  . polyethylene glycol (MIRALAX / GLYCOLAX) 17 g packet Take 17 g by mouth daily as needed (constipation).  . polyethylene glycol (MIRALAX / GLYCOLAX) 17 g packet  Take 17 g by mouth daily.  Marland Kitchen PRESCRIPTION MEDICATION Apply 1 application topically See admin instructions. Dermatological intment for wound care - apply to tailbone daily as needed for sores  . Probiotic Product (ALIGN PO) Take 1 tablet by mouth every morning.   . propafenone (RYTHMOL SR) 225 MG 12 hr capsule Take 1 capsule (225 mg total) by mouth every 12 (twelve) hours.  . PSYLLIUM PO Take 10 mLs by mouth daily before breakfast. Mix in 8 oz liquid and drink  . senna-docusate (SENOKOT-S) 8.6-50 MG tablet Take 2 tablets by mouth 2 (two) times daily as needed for mild constipation.  . sildenafil (REVATIO) 20 MG tablet Take 100 mg by mouth daily as needed (erectile dysfunction).  . tamsulosin (FLOMAX) 0.4 MG CAPS capsule Take 0.4 mg by mouth every morning.   . warfarin (COUMADIN) 5 MG tablet Take 7.5-10 mg by mouth See admin instructions. Take 2 tablets (10 mg) by mouth on Monday, Wednesday, Friday night, take 1 1 /2 tablets (7.5 mg) on Sunday, Tuesday, Thursday, Saturday night   No facility-administered encounter medications on file as of 05/21/2019.    [facility] CM Care Plan Problem One     Most Recent Value  Care Plan Problem One  Multiple Rib Fractures  Role Documenting the Problem One  Care Management Coordinator  Care Plan for Problem One  Active  [facility] Long Term Goal   Pt will not have complications related  to his rib fractures in the next 60 days.  [facility] Long Term Goal Start Date  05/21/19  Interventions for Problem One Long Term Goal  Discussed prevention of pneumonia by use of spirometry, exercise. Advised to guard affected side with a pillow when coughing to reduce discomfort. Ensure BM every other day.  [facility] CM Short Term Goal #1   Pt will have OV with cardiologist or primary care within 2 weeks.  [facility] CM Short Term Goal #1 Start Date  05/21/19  Interventions for Short Term Goal #1  Advised pt that people that have been hospitalized have increased chances of rehospitalization if they do not have  timely follow up.    [facility] CM Care Plan Problem Two     Most Recent Value  Care Plan Problem Two  Multiple cardiac diagnoses  Role Documenting the Problem Two  Care Management Coordinator  Care Plan for Problem Two  Active  Interventions for Problem Two Long Term Goal   Advised pt he needs to have a follow up ideally in 10 days of hospital discharge. Messaging primary care MD of need.  [facility] Long Term Goal  Pt will not be rehospitalized for cardiac issue over the next 60 days.  [facility] Long Term Goal Start Date  05/21/19  [facility] CM Short Term Goal #1   Pt to have follow up appt with either primary care or cardiology within the next 2 weeks.  [facility] CM Short Term Goal #1 Start Date  05/21/19  Interventions for Short Term Goal #2   Advised pt of need and messaged MDs     Eulah Pont. Myrtie Neither, MSN, [facility] Gerontological Nurse Practitioner [facility] Care Management [phone]

## 2019-05-23 DIAGNOSIS — N182 Chronic kidney disease, stage 2 (mild): Secondary | ICD-10-CM | POA: Diagnosis not present

## 2019-05-28 ENCOUNTER — Other Ambulatory Visit: Payer: Self-pay | Admitting: *Deleted

## 2019-05-28 NOTE — Patient Outreach (Addendum)
Telephone call transition of care #2.  Steven Macdonald is progressing very well in his recovery for multiple broken rib fxs. He denies any pain today. He says he has not had to use ANY pain relievers since his first post fall day. He is doing all his respiratory preventive exercises. He does not require guarding of his ribs.  No cardiac sxs reported.  Has scheduled follow up appt with primary care provider and will see him today!  I do not feel I will need to follow pt past the month's transition of care calls. He is very compliant with all medical recommendations.  Eulah Pont. Myrtie Neither, MSN, [facility] Gerontological Nurse Practitioner [facility] Care Management [phone]

## 2019-06-04 ENCOUNTER — Other Ambulatory Visit: Payer: Self-pay | Admitting: *Deleted

## 2019-06-04 NOTE — Patient Outreach (Addendum)
Telephone outreach for transition of care. Unable to reach.  Cardiac issues?  Pain?  Steven Macdonald. Myrtie Neither, MSN, [facility] Gerontological Nurse Practitioner [facility] Care Management [phone]

## 2019-06-09 ENCOUNTER — Other Ambulatory Visit: Payer: Self-pay | Admitting: *Deleted

## 2019-06-09 ENCOUNTER — Encounter: Payer: Self-pay | Admitting: *Deleted

## 2019-06-09 MED ORDER — PROPAFENONE HCL ER 225 MG PO CP12: 225.0000 mg | ORAL_CAPSULE | Freq: Two times a day (BID) | ORAL | 3 refills | Status: DC

## 2019-06-09 NOTE — Patient Outreach (Signed)
Telephone outreach.  Mr. Weinzapfel says he has recuperated well from his fall and rib fractures. He continues to use his incentive spirometer and has had no indications of any respiratory problems. He is walking up to 4 miles a day now. He has not had any cardiac episodes which would cause him to call MD  I am closing his case today as he has not further needs and has completed his care plan goals.  Eulah Pont. Myrtie Neither, MSN, [facility] Gerontological Nurse Practitioner [facility] Care Management [phone]

## 2019-06-11 ENCOUNTER — Other Ambulatory Visit: Payer: Self-pay | Admitting: *Deleted

## 2019-06-11 NOTE — Progress Notes (Signed)
Remote pacemaker transmission.   

## 2019-06-18 ENCOUNTER — Ambulatory Visit: Payer: Self-pay | Admitting: *Deleted

## 2019-06-25 ENCOUNTER — Other Ambulatory Visit: Payer: Self-pay

## 2019-06-25 ENCOUNTER — Encounter: Payer: Self-pay | Admitting: Internal Medicine

## 2019-06-25 ENCOUNTER — Ambulatory Visit (INDEPENDENT_AMBULATORY_CARE_PROVIDER_SITE_OTHER): Payer: Medicare Other | Admitting: Internal Medicine

## 2019-06-25 VITALS — BP 124/68 | HR 61 | Ht 75.0 in | Wt 177.8 lb

## 2019-06-25 DIAGNOSIS — I495 Sick sinus syndrome: Secondary | ICD-10-CM | POA: Diagnosis not present

## 2019-06-25 DIAGNOSIS — R55 Syncope and collapse: Secondary | ICD-10-CM

## 2019-06-25 DIAGNOSIS — I471 Supraventricular tachycardia: Secondary | ICD-10-CM | POA: Diagnosis not present

## 2019-06-25 NOTE — Progress Notes (Signed)
Patient Care Team: Crist Infante, MD as PCP - General   HPI  Steven Macdonald is a 85 y.o. male Seen in followup for AFlutter s/p RFCA He has had no recurrent arrhythmia.  He had a previously implanted dual-chamber Medtronic pacemaker for sinus node dysfunction  He underwent generator repalcement 11/15     DATE TEST EF   10/09 CTA  Ca score 0  12/13 Echo   25 %   1/15 Myoview  Multiple perfusion defects   1/15 Echo   45-50 %   10/16 Echo  45-50%   11/20 Echo  40-45%    He has a history of pulmonary embolism and is on Coumadin.     Date Cr K Hgb  10/18   14.5   11/20 0.99 4.0 14.2   He is not as strong or resilient as he has been, but no chest pain.  Walking 4 miles most days but more slowly than in the past.  No edema  Past Medical History:  Diagnosis Date  . Arthritis   . Atrial flutter ([place])    RFCA   . BPH (benign prostatic hypertrophy)   . Cardiomyopathy, nonischemic -resolved    Echo 2008: EF 25% Cardiac CT 2009: Normal cors EF 31% Echo 3/10 EF 45% Echo 6/13: EF 50-55%    . CHF (congestive heart failure) ([place])   . Chicken pox   . Colon polyps    hyperplastic  . Diabetes mellitus ([place])   . ED (erectile dysfunction)   . Gilbert's syndrome    possible  . Hemorrhoids   . Herniated cervical disc   . HLD (hyperlipidemia)   . HTN (hypertension)   . LBBB (left bundle branch block)   . Orthostatic hypotension   . Pacemaker  Medtronic    DOI 2/06  . 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 Pulmonary embolism ([place])   . Sinus node dysfunction [facility])         Past Surgical History:  Procedure Laterality Date  . CARDIOVERSION    . coronary ablation    . CYSTOSCOPY W/ TRANSURETHRAL RESECTION OF POSTERIOR URETHERAL VALVES    . PACEMAKER GENERATOR CHANGE N/A 05/13/2014   Procedure: PACEMAKER GENERATOR CHANGE;  Surgeon: Deboraha Sprang, MD;  Location: [facility] CATH LAB;  Service: Cardiovascular;  Laterality: N/A;  . PACEMAKER INSERTION      Current Outpatient  Medications  Medication Sig Dispense Refill  . Calcium-Magnesium-Vitamin D (CALCIUM 500 PO) Take 1 tablet by mouth every morning.     . cholecalciferol (VITAMIN D) 1000 UNITS tablet Take 1,000 Units by mouth every morning.     . Cyanocobalamin (VITAMIN B-12 PO) Take 1 tablet by mouth every morning.    . finasteride (PROSCAR) 5 MG tablet Take 5 mg by mouth every morning.     . fish oil-omega-3 fatty acids 1000 MG capsule Take 1 g by mouth every morning.     . mirtazapine (REMERON) 15 MG tablet Take 15 mg by mouth at bedtime.    . polyethylene glycol (MIRALAX / GLYCOLAX) 17 g packet Take 17 g by mouth daily as needed (constipation).    Marland Kitchen PRESCRIPTION MEDICATION Apply 1 application topically See admin instructions. Dermatological intment for wound care - apply to tailbone daily as needed for sores    . Probiotic Product (ALIGN PO) Take 1 tablet by mouth every morning.     . propafenone (RYTHMOL SR) 225 MG 12 hr capsule Take 1 capsule (225 mg  total) by mouth every 12 (twelve) hours. 180 capsule 3  . PSYLLIUM PO Take 10 mLs by mouth daily before breakfast. Mix in 8 oz liquid and drink    . senna-docusate (SENOKOT-S) 8.6-50 MG tablet Take 2 tablets by mouth 2 (two) times daily as needed for mild constipation. 90 tablet 0  . sildenafil (REVATIO) 20 MG tablet Take 100 mg by mouth daily as needed (erectile dysfunction).    . simvastatin (ZOCOR) 40 MG tablet Take 20 mg by mouth 2 (two) times daily.    . tamsulosin (FLOMAX) 0.4 MG CAPS capsule Take 0.4 mg by mouth every morning.     . warfarin (COUMADIN) 5 MG tablet Take 7.5-10 mg by mouth See admin instructions. Take 2 tablets (10 mg) by mouth on Monday, Wednesday, Friday night, take 1 1 /2 tablets (7.5 mg) on Sunday, Tuesday, Thursday, Saturday night     No current facility-administered medications for this visit.    No Known Allergies  Review of Systems negative except from HPI and PMH  Physical Exam BP 124/68   Pulse 61   Ht 6\' 3"  (1.905 m)    Wt 177 lb 12.8 oz (80.6 kg)   SpO2 95%   BMI 22.22 kg/m  Well developed and well nourished in no acute distress HENT normal Neck supple with JVP-flat Clear Device pocket well healed; without hematoma or erythema.  There is no tethering  Regular rate and rhythm, no murmur Abd-soft with active BS No Clubbing cyanosis  edema Skin-warm and dry A & Oriented  Grossly normal sensory and motor function      Assessment and  Plan  Sinus node dysfunction  Dypsnea on exertion  Pacemaker-Medtronic .The patient's device was interrogated.  The information was reviewed. No changes were made in the programming.     Nonischemic cardiomyopathy-resolved  CHF chronic systolic  LBBB   Pulmonary Embolism  Chronic therapy  depression    On Anticoagulation;  No bleeding issues   Dyspnea stable, no evidence of volume overload  Continue current meds  Mood better

## 2019-06-25 NOTE — Patient Instructions (Addendum)
Medication Instructions:  Your physician recommends that you continue on your current medications as directed. Please refer to the Current Medication list given to you today.  Labwork: None ordered.  Testing/Procedures: None ordered.  Follow-Up: Your physician wants you to follow-up in: November 2021. You will receive a reminder letter in the mail two months in advance. If you don't receive a letter, please call our office to schedule the follow-up appointment.  Remote monitoring is used to monitor your Pacemaker of ICD from home. This monitoring reduces the number of office visits required to check your device to one time per year. It allows [place] to keep an eye on the functioning of your device to ensure it is working properly.   Any Other Special Instructions Will Be Listed Below (If Applicable).  If you need a refill on your cardiac medications before your next appointment, please call your pharmacy.

## 2019-06-26 LAB — CUP PACEART INCLINIC DEVICE CHECK
Battery Remaining Longevity: 64 mo
Battery Voltage: 3 V
Brady Statistic AP VP Percent: 1.21 %
Brady Statistic AP VS Percent: 83.05 %
Brady Statistic AS VP Percent: 0.08 %
Brady Statistic AS VS Percent: 15.67 %
Brady Statistic RA Percent Paced: 83.74 %
Brady Statistic RV Percent Paced: 1.38 %
Date Time Interrogation Session: 20210106111900
Implantable Lead Implant Date: 20060223
Implantable Lead Implant Date: 20060223
Implantable Lead Location: 753859
Implantable Lead Location: 753860
Implantable Lead Model: 5076
Implantable Lead Model: 5076
Implantable Pulse Generator Implant Date: 20151125
Lead Channel Impedance Value: 437 Ohm
Lead Channel Impedance Value: 456 Ohm
Lead Channel Impedance Value: 532 Ohm
Lead Channel Impedance Value: 551 Ohm
Lead Channel Pacing Threshold Amplitude: 0.75 V
Lead Channel Pacing Threshold Amplitude: 0.875 V
Lead Channel Pacing Threshold Pulse Width: 0.4 ms
Lead Channel Pacing Threshold Pulse Width: 0.4 ms
Lead Channel Sensing Intrinsic Amplitude: 2.3 mV
Lead Channel Sensing Intrinsic Amplitude: 5.3 mV
Lead Channel Setting Pacing Amplitude: 2 V
Lead Channel Setting Pacing Amplitude: 2.5 V
Lead Channel Setting Pacing Pulse Width: 0.4 ms
Lead Channel Setting Sensing Sensitivity: 2.8 mV

## 2019-07-02 DIAGNOSIS — M542 Cervicalgia: Secondary | ICD-10-CM | POA: Diagnosis not present

## 2019-07-02 DIAGNOSIS — G589 Mononeuropathy, unspecified: Secondary | ICD-10-CM | POA: Diagnosis not present

## 2019-07-02 DIAGNOSIS — R269 Unspecified abnormalities of gait and mobility: Secondary | ICD-10-CM | POA: Diagnosis not present

## 2019-07-02 DIAGNOSIS — M545 Low back pain: Secondary | ICD-10-CM | POA: Diagnosis not present

## 2019-07-04 DIAGNOSIS — M542 Cervicalgia: Secondary | ICD-10-CM | POA: Diagnosis not present

## 2019-07-04 DIAGNOSIS — R269 Unspecified abnormalities of gait and mobility: Secondary | ICD-10-CM | POA: Diagnosis not present

## 2019-07-04 DIAGNOSIS — M545 Low back pain: Secondary | ICD-10-CM | POA: Diagnosis not present

## 2019-07-04 DIAGNOSIS — G589 Mononeuropathy, unspecified: Secondary | ICD-10-CM | POA: Diagnosis not present

## 2019-07-07 DIAGNOSIS — R269 Unspecified abnormalities of gait and mobility: Secondary | ICD-10-CM | POA: Diagnosis not present

## 2019-07-07 DIAGNOSIS — M542 Cervicalgia: Secondary | ICD-10-CM | POA: Diagnosis not present

## 2019-07-07 DIAGNOSIS — M545 Low back pain: Secondary | ICD-10-CM | POA: Diagnosis not present

## 2019-07-07 DIAGNOSIS — G589 Mononeuropathy, unspecified: Secondary | ICD-10-CM | POA: Diagnosis not present

## 2019-07-10 DIAGNOSIS — R269 Unspecified abnormalities of gait and mobility: Secondary | ICD-10-CM | POA: Diagnosis not present

## 2019-07-10 DIAGNOSIS — M542 Cervicalgia: Secondary | ICD-10-CM | POA: Diagnosis not present

## 2019-07-10 DIAGNOSIS — G589 Mononeuropathy, unspecified: Secondary | ICD-10-CM | POA: Diagnosis not present

## 2019-07-10 DIAGNOSIS — M545 Low back pain: Secondary | ICD-10-CM | POA: Diagnosis not present

## 2019-07-15 DIAGNOSIS — R269 Unspecified abnormalities of gait and mobility: Secondary | ICD-10-CM | POA: Diagnosis not present

## 2019-07-15 DIAGNOSIS — G589 Mononeuropathy, unspecified: Secondary | ICD-10-CM | POA: Diagnosis not present

## 2019-07-15 DIAGNOSIS — M542 Cervicalgia: Secondary | ICD-10-CM | POA: Diagnosis not present

## 2019-07-15 DIAGNOSIS — M545 Low back pain: Secondary | ICD-10-CM | POA: Diagnosis not present

## 2019-07-18 DIAGNOSIS — M542 Cervicalgia: Secondary | ICD-10-CM | POA: Diagnosis not present

## 2019-07-18 DIAGNOSIS — G589 Mononeuropathy, unspecified: Secondary | ICD-10-CM | POA: Diagnosis not present

## 2019-07-18 DIAGNOSIS — M545 Low back pain: Secondary | ICD-10-CM | POA: Diagnosis not present

## 2019-07-18 DIAGNOSIS — R269 Unspecified abnormalities of gait and mobility: Secondary | ICD-10-CM | POA: Diagnosis not present

## 2019-07-22 DIAGNOSIS — Z7901 Long term (current) use of anticoagulants: Secondary | ICD-10-CM | POA: Diagnosis not present

## 2019-08-05 DIAGNOSIS — N182 Chronic kidney disease, stage 2 (mild): Secondary | ICD-10-CM | POA: Diagnosis not present

## 2019-08-05 DIAGNOSIS — I480 Paroxysmal atrial fibrillation: Secondary | ICD-10-CM | POA: Diagnosis not present

## 2019-08-05 DIAGNOSIS — Z7901 Long term (current) use of anticoagulants: Secondary | ICD-10-CM | POA: Diagnosis not present

## 2019-08-05 DIAGNOSIS — M79604 Pain in right leg: Secondary | ICD-10-CM | POA: Diagnosis not present

## 2019-08-05 DIAGNOSIS — I509 Heart failure, unspecified: Secondary | ICD-10-CM | POA: Diagnosis not present

## 2019-08-05 DIAGNOSIS — I130 Hypertensive heart and chronic kidney disease with heart failure and stage 1 through stage 4 chronic kidney disease, or unspecified chronic kidney disease: Secondary | ICD-10-CM | POA: Diagnosis not present

## 2019-08-05 DIAGNOSIS — M7121 Synovial cyst of popliteal space [Baker], right knee: Secondary | ICD-10-CM | POA: Diagnosis not present

## 2019-08-13 ENCOUNTER — Ambulatory Visit (INDEPENDENT_AMBULATORY_CARE_PROVIDER_SITE_OTHER): Payer: Medicare Other | Admitting: *Deleted

## 2019-08-13 DIAGNOSIS — I495 Sick sinus syndrome: Secondary | ICD-10-CM

## 2019-08-13 LAB — CUP PACEART REMOTE DEVICE CHECK
Battery Remaining Longevity: 60 mo
Battery Voltage: 2.99 V
Brady Statistic AP VP Percent: 7.08 %
Brady Statistic AP VS Percent: 82.46 %
Brady Statistic AS VP Percent: 0.38 %
Brady Statistic AS VS Percent: 10.08 %
Brady Statistic RA Percent Paced: 88.99 %
Brady Statistic RV Percent Paced: 7.75 %
Date Time Interrogation Session: 20210224105900
Implantable Lead Implant Date: 20060223
Implantable Lead Implant Date: 20060223
Implantable Lead Location: 753859
Implantable Lead Location: 753860
Implantable Lead Model: 5076
Implantable Lead Model: 5076
Implantable Pulse Generator Implant Date: 20151125
Lead Channel Impedance Value: 361 Ohm
Lead Channel Impedance Value: 399 Ohm
Lead Channel Impedance Value: 475 Ohm
Lead Channel Impedance Value: 475 Ohm
Lead Channel Pacing Threshold Amplitude: 0.625 V
Lead Channel Pacing Threshold Amplitude: 0.875 V
Lead Channel Pacing Threshold Pulse Width: 0.4 ms
Lead Channel Pacing Threshold Pulse Width: 0.4 ms
Lead Channel Sensing Intrinsic Amplitude: 2.125 mV
Lead Channel Sensing Intrinsic Amplitude: 2.125 mV
Lead Channel Sensing Intrinsic Amplitude: 4 mV
Lead Channel Sensing Intrinsic Amplitude: 4 mV
Lead Channel Setting Pacing Amplitude: 2 V
Lead Channel Setting Pacing Amplitude: 2.5 V
Lead Channel Setting Pacing Pulse Width: 0.4 ms
Lead Channel Setting Sensing Sensitivity: 2.8 mV

## 2019-08-13 NOTE — Progress Notes (Signed)
PPM Remote  

## 2019-09-03 DIAGNOSIS — H2513 Age-related nuclear cataract, bilateral: Secondary | ICD-10-CM | POA: Diagnosis not present

## 2019-09-08 DIAGNOSIS — I480 Paroxysmal atrial fibrillation: Secondary | ICD-10-CM | POA: Diagnosis not present

## 2019-10-06 DIAGNOSIS — E7849 Other hyperlipidemia: Secondary | ICD-10-CM | POA: Diagnosis not present

## 2019-10-06 DIAGNOSIS — R7301 Impaired fasting glucose: Secondary | ICD-10-CM | POA: Diagnosis not present

## 2019-10-06 DIAGNOSIS — R82998 Other abnormal findings in urine: Secondary | ICD-10-CM | POA: Diagnosis not present

## 2019-10-06 DIAGNOSIS — I10 Essential (primary) hypertension: Secondary | ICD-10-CM | POA: Diagnosis not present

## 2019-10-06 DIAGNOSIS — Z125 Encounter for screening for malignant neoplasm of prostate: Secondary | ICD-10-CM | POA: Diagnosis not present

## 2019-10-08 DIAGNOSIS — Z1212 Encounter for screening for malignant neoplasm of rectum: Secondary | ICD-10-CM | POA: Diagnosis not present

## 2019-10-13 DIAGNOSIS — Z1331 Encounter for screening for depression: Secondary | ICD-10-CM | POA: Diagnosis not present

## 2019-10-13 DIAGNOSIS — D696 Thrombocytopenia, unspecified: Secondary | ICD-10-CM | POA: Diagnosis not present

## 2019-10-13 DIAGNOSIS — R5383 Other fatigue: Secondary | ICD-10-CM | POA: Diagnosis not present

## 2019-10-13 DIAGNOSIS — I130 Hypertensive heart and chronic kidney disease with heart failure and stage 1 through stage 4 chronic kidney disease, or unspecified chronic kidney disease: Secondary | ICD-10-CM | POA: Diagnosis not present

## 2019-10-13 DIAGNOSIS — N182 Chronic kidney disease, stage 2 (mild): Secondary | ICD-10-CM | POA: Diagnosis not present

## 2019-10-13 DIAGNOSIS — D6869 Other thrombophilia: Secondary | ICD-10-CM | POA: Diagnosis not present

## 2019-10-13 DIAGNOSIS — R55 Syncope and collapse: Secondary | ICD-10-CM | POA: Diagnosis not present

## 2019-10-13 DIAGNOSIS — I480 Paroxysmal atrial fibrillation: Secondary | ICD-10-CM | POA: Diagnosis not present

## 2019-10-13 DIAGNOSIS — I509 Heart failure, unspecified: Secondary | ICD-10-CM | POA: Diagnosis not present

## 2019-10-13 DIAGNOSIS — Z0000 Encounter for general adult medical examination without abnormal findings: Secondary | ICD-10-CM | POA: Diagnosis not present

## 2019-10-13 DIAGNOSIS — M545 Low back pain: Secondary | ICD-10-CM | POA: Diagnosis not present

## 2019-10-13 DIAGNOSIS — I2699 Other pulmonary embolism without acute cor pulmonale: Secondary | ICD-10-CM | POA: Diagnosis not present

## 2019-10-14 ENCOUNTER — Other Ambulatory Visit: Payer: Self-pay | Admitting: Internal Medicine

## 2019-10-14 DIAGNOSIS — I77810 Thoracic aortic ectasia: Secondary | ICD-10-CM

## 2019-10-15 DIAGNOSIS — M545 Low back pain: Secondary | ICD-10-CM | POA: Diagnosis not present

## 2019-10-15 DIAGNOSIS — M9903 Segmental and somatic dysfunction of lumbar region: Secondary | ICD-10-CM | POA: Diagnosis not present

## 2019-10-17 DIAGNOSIS — M9903 Segmental and somatic dysfunction of lumbar region: Secondary | ICD-10-CM | POA: Diagnosis not present

## 2019-10-17 DIAGNOSIS — M545 Low back pain: Secondary | ICD-10-CM | POA: Diagnosis not present

## 2019-10-21 DIAGNOSIS — M9903 Segmental and somatic dysfunction of lumbar region: Secondary | ICD-10-CM | POA: Diagnosis not present

## 2019-10-21 DIAGNOSIS — M545 Low back pain: Secondary | ICD-10-CM | POA: Diagnosis not present

## 2019-10-23 IMAGING — US US ABDOMEN LIMITED
1 series · 14 of 25 positions shown · non-contrast
Comparison: None.

CLINICAL DATA: Hyperbilirubinemia.

EXAM:
ULTRASOUND ABDOMEN LIMITED RIGHT UPPER QUADRANT

[Series 1: us abdomen limited · 0.26mm/px · 14 of 40 slices shown]
[im 1/40]
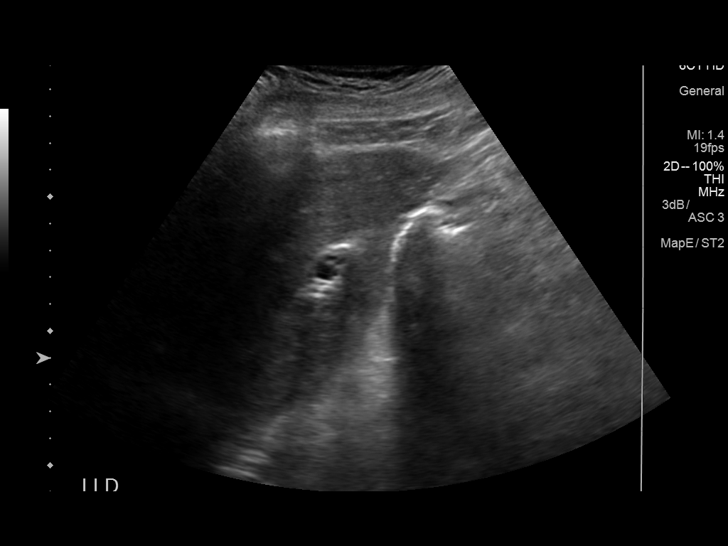
[im 4/40]
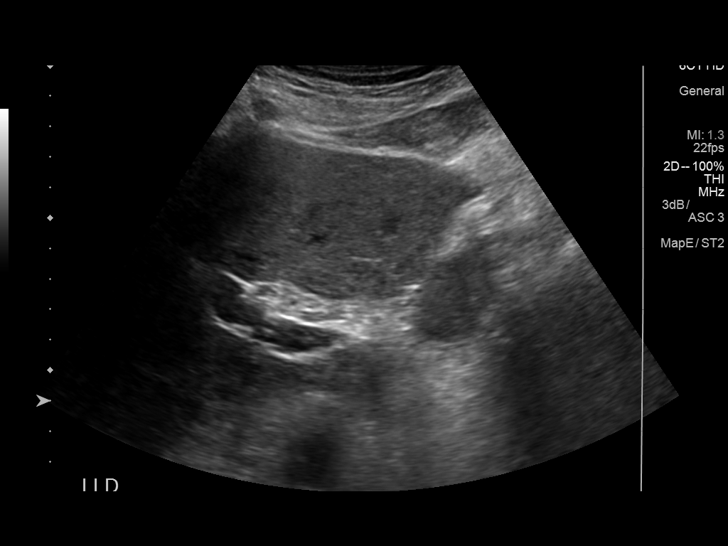
[im 7/40]
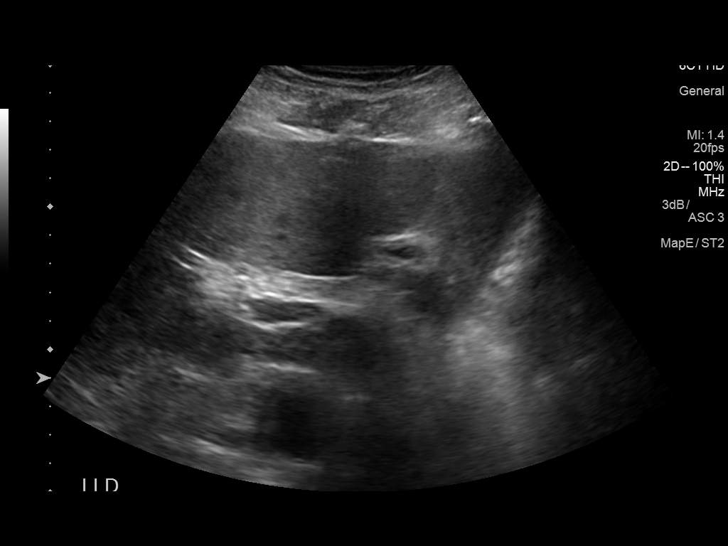
[im 10/40]
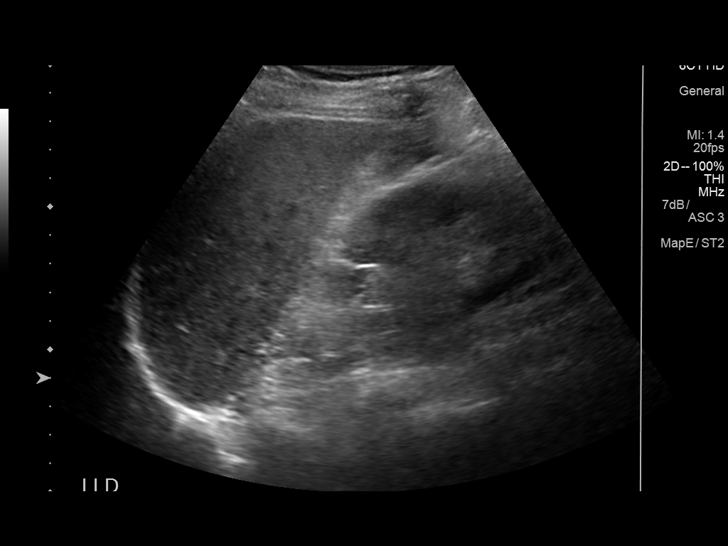
[im 14/40]
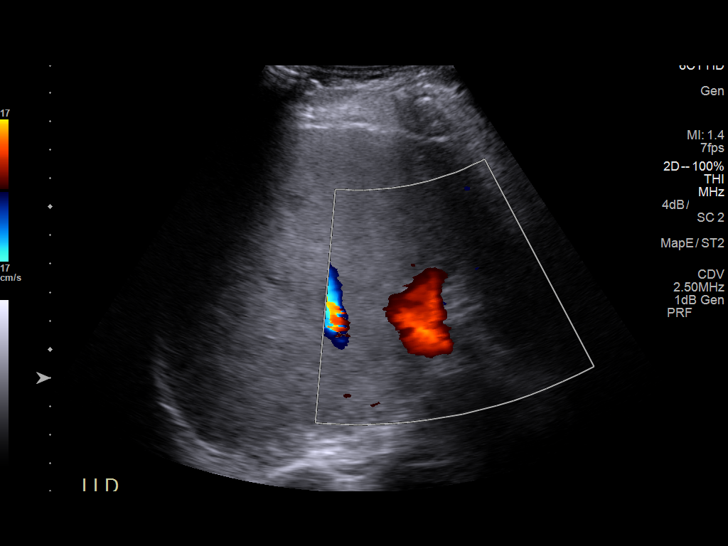
[im 15/40]
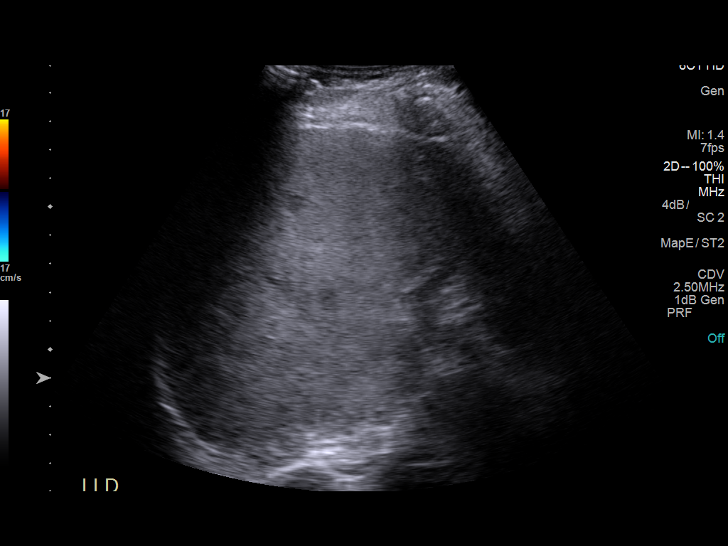
[im 18/40]
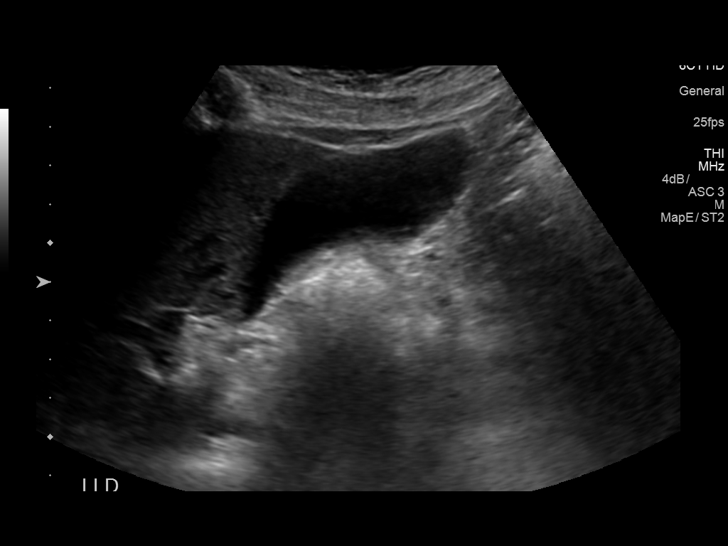
[im 22/40]
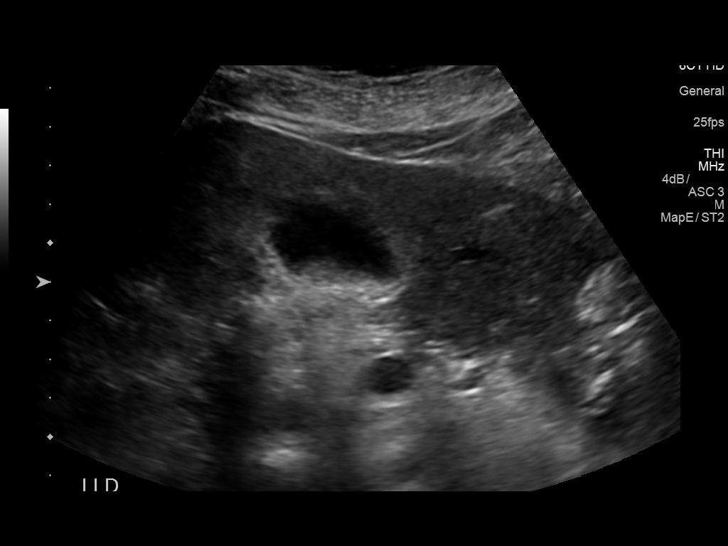
[im 25/40]
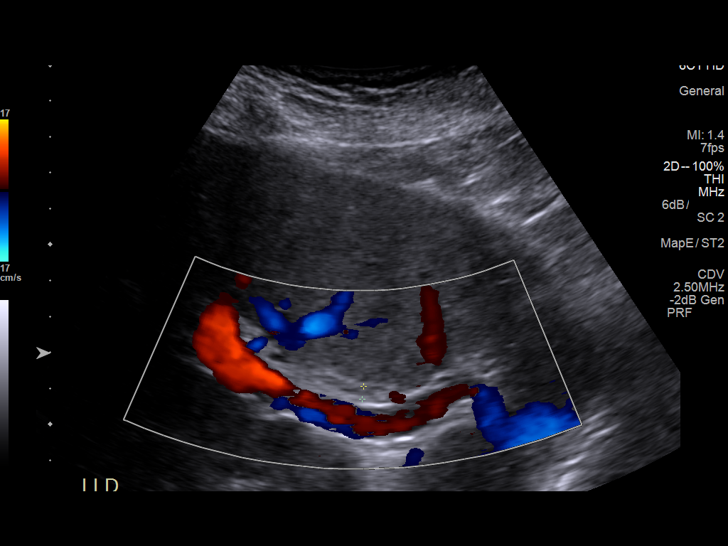
[im 27/40]
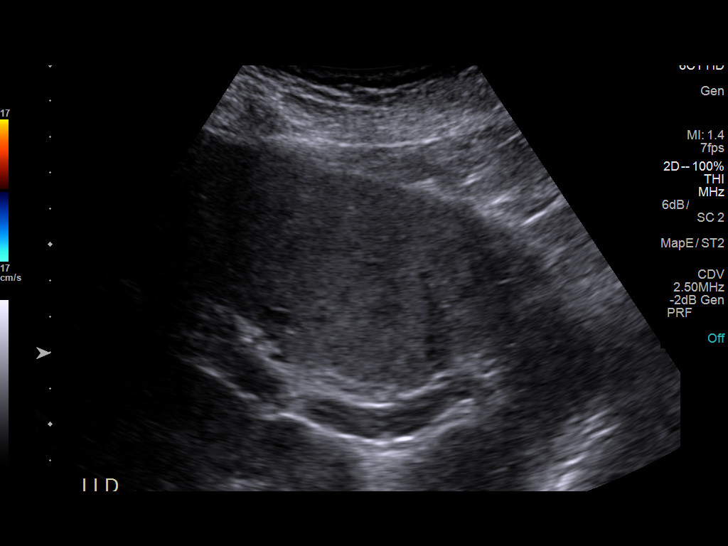
[im 30/40]
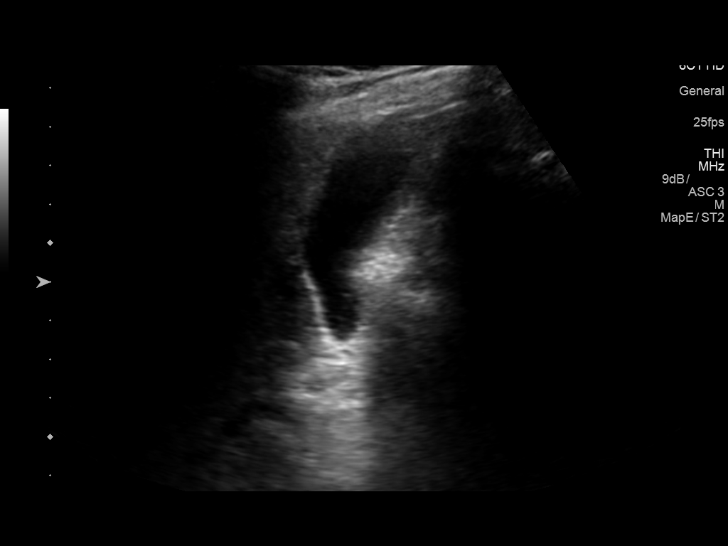
[im 33/40]
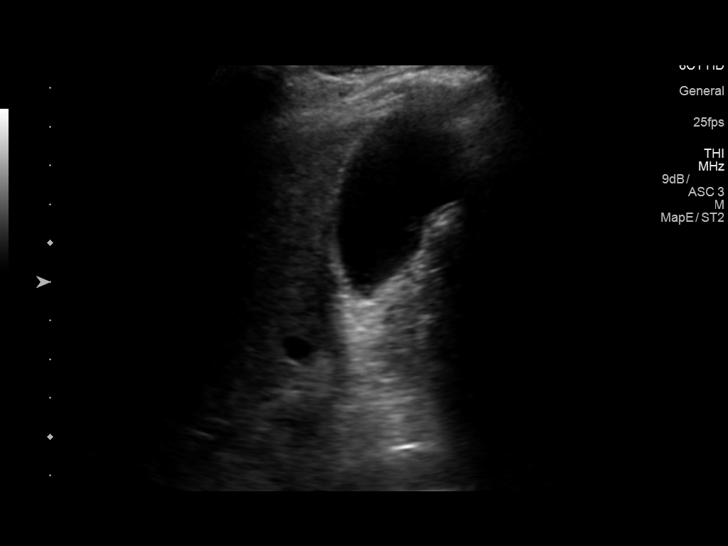
[im 36/40]
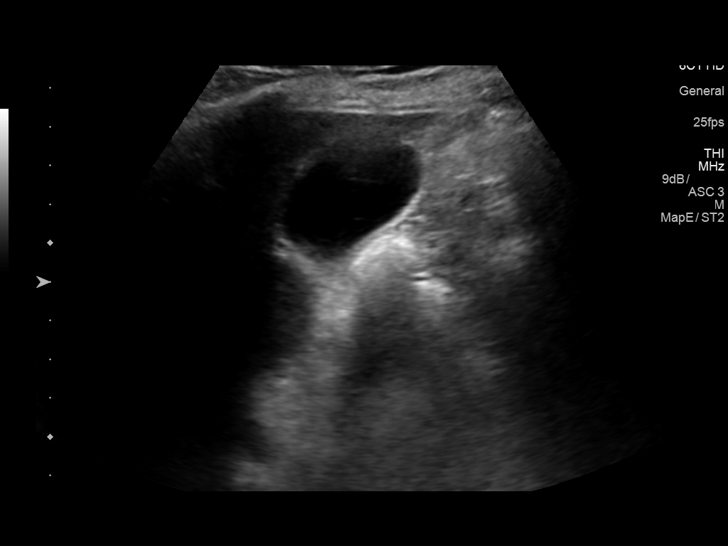
[im 40/40]
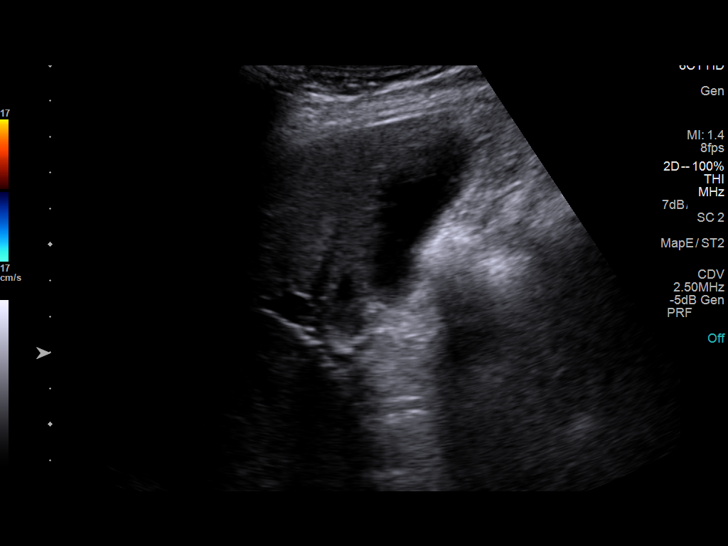

[14 of 25 positions shown; findings below may reference images not displayed]

FINDINGS: Gallbladder:

No gallstones or wall thickening visualized. No sonographic Murphy
sign noted by sonographer.

Common bile duct:

Diameter: 3.6 mm which is within normal limits.

Liver:

No focal lesion identified. Within normal limits in parenchymal
echogenicity. Portal vein is patent on color Doppler imaging with
normal direction of blood flow towards the liver.
IMPRESSION: No definite abnormality seen in the right upper quadrant of the
abdomen.

## 2019-10-24 DIAGNOSIS — M545 Low back pain: Secondary | ICD-10-CM | POA: Diagnosis not present

## 2019-10-24 DIAGNOSIS — M9903 Segmental and somatic dysfunction of lumbar region: Secondary | ICD-10-CM | POA: Diagnosis not present

## 2019-10-28 DIAGNOSIS — M9903 Segmental and somatic dysfunction of lumbar region: Secondary | ICD-10-CM | POA: Diagnosis not present

## 2019-10-28 DIAGNOSIS — M545 Low back pain: Secondary | ICD-10-CM | POA: Diagnosis not present

## 2019-10-29 ENCOUNTER — Other Ambulatory Visit: Payer: Medicare Other

## 2019-11-12 ENCOUNTER — Ambulatory Visit (INDEPENDENT_AMBULATORY_CARE_PROVIDER_SITE_OTHER): Payer: Medicare Other | Admitting: *Deleted

## 2019-11-12 DIAGNOSIS — R55 Syncope and collapse: Secondary | ICD-10-CM | POA: Diagnosis not present

## 2019-11-12 LAB — CUP PACEART REMOTE DEVICE CHECK
Battery Remaining Longevity: 54 mo
Battery Voltage: 2.98 V
Brady Statistic AP VP Percent: 21.82 %
Brady Statistic AP VS Percent: 64.1 %
Brady Statistic AS VP Percent: 3.68 %
Brady Statistic AS VS Percent: 10.41 %
Brady Statistic RA Percent Paced: 85.22 %
Brady Statistic RV Percent Paced: 25.88 %
Date Time Interrogation Session: 20210526105045
Implantable Lead Implant Date: 20060223
Implantable Lead Implant Date: 20060223
Implantable Lead Location: 753859
Implantable Lead Location: 753860
Implantable Lead Model: 5076
Implantable Lead Model: 5076
Implantable Pulse Generator Implant Date: 20151125
Lead Channel Impedance Value: 342 Ohm
Lead Channel Impedance Value: 380 Ohm
Lead Channel Impedance Value: 456 Ohm
Lead Channel Impedance Value: 456 Ohm
Lead Channel Pacing Threshold Amplitude: 0.625 V
Lead Channel Pacing Threshold Amplitude: 0.875 V
Lead Channel Pacing Threshold Pulse Width: 0.4 ms
Lead Channel Pacing Threshold Pulse Width: 0.4 ms
Lead Channel Sensing Intrinsic Amplitude: 2.375 mV
Lead Channel Sensing Intrinsic Amplitude: 2.375 mV
Lead Channel Sensing Intrinsic Amplitude: 3.5 mV
Lead Channel Sensing Intrinsic Amplitude: 3.5 mV
Lead Channel Setting Pacing Amplitude: 2 V
Lead Channel Setting Pacing Amplitude: 2.5 V
Lead Channel Setting Pacing Pulse Width: 0.4 ms
Lead Channel Setting Sensing Sensitivity: 2.8 mV

## 2019-11-14 NOTE — Progress Notes (Signed)
Remote pacemaker transmission.   

## 2019-11-18 DIAGNOSIS — Z7901 Long term (current) use of anticoagulants: Secondary | ICD-10-CM | POA: Diagnosis not present

## 2019-11-21 ENCOUNTER — Other Ambulatory Visit: Payer: Medicare Other

## 2019-12-02 ENCOUNTER — Other Ambulatory Visit: Payer: Self-pay

## 2019-12-02 ENCOUNTER — Ambulatory Visit
Admission: RE | Admit: 2019-12-02 | Discharge: 2019-12-02 | Disposition: A | Discharge status: Home / Self Care | Payer: Medicare Other | Source: Ambulatory Visit | Attending: Internal Medicine | Admitting: Internal Medicine

## 2019-12-02 DIAGNOSIS — I77810 Thoracic aortic ectasia: Secondary | ICD-10-CM

## 2019-12-02 MED ORDER — IOPAMIDOL (ISOVUE-370) INJECTION 76%
75.0000 mL | Freq: Once | INTRAVENOUS | Status: AC | PRN
  Administered 2019-12-02: 75 mL via INTRAVENOUS

## 2020-01-07 DIAGNOSIS — Z7901 Long term (current) use of anticoagulants: Secondary | ICD-10-CM | POA: Diagnosis not present

## 2020-02-11 ENCOUNTER — Telehealth: Payer: Self-pay

## 2020-02-11 ENCOUNTER — Ambulatory Visit (INDEPENDENT_AMBULATORY_CARE_PROVIDER_SITE_OTHER): Payer: Medicare Other | Admitting: *Deleted

## 2020-02-11 DIAGNOSIS — I495 Sick sinus syndrome: Secondary | ICD-10-CM

## 2020-02-11 DIAGNOSIS — R55 Syncope and collapse: Secondary | ICD-10-CM | POA: Diagnosis not present

## 2020-02-11 NOTE — Telephone Encounter (Signed)
The pt was trying to send a transmission. The transmission was unsuccessful. I called tech support on conference call to get help with his monitor. We got the monitor to work. Transmission received.

## 2020-02-12 LAB — CUP PACEART REMOTE DEVICE CHECK
Battery Remaining Longevity: 43 mo
Battery Voltage: 2.98 V
Brady Statistic AP VP Percent: 50.31 %
Brady Statistic AP VS Percent: 36.82 %
Brady Statistic AS VP Percent: 6.38 %
Brady Statistic AS VS Percent: 6.49 %
Brady Statistic RA Percent Paced: 86.31 %
Brady Statistic RV Percent Paced: 56.47 %
Date Time Interrogation Session: 20210825083037
Implantable Lead Implant Date: 20060223
Implantable Lead Implant Date: 20060223
Implantable Lead Location: 753859
Implantable Lead Location: 753860
Implantable Lead Model: 5076
Implantable Lead Model: 5076
Implantable Pulse Generator Implant Date: 20151125
Lead Channel Impedance Value: 342 Ohm
Lead Channel Impedance Value: 380 Ohm
Lead Channel Impedance Value: 456 Ohm
Lead Channel Impedance Value: 475 Ohm
Lead Channel Pacing Threshold Amplitude: 0.625 V
Lead Channel Pacing Threshold Amplitude: 0.875 V
Lead Channel Pacing Threshold Pulse Width: 0.4 ms
Lead Channel Pacing Threshold Pulse Width: 0.4 ms
Lead Channel Sensing Intrinsic Amplitude: 2.25 mV
Lead Channel Sensing Intrinsic Amplitude: 2.25 mV
Lead Channel Sensing Intrinsic Amplitude: 3.5 mV
Lead Channel Sensing Intrinsic Amplitude: 3.5 mV
Lead Channel Setting Pacing Amplitude: 2 V
Lead Channel Setting Pacing Amplitude: 2.5 V
Lead Channel Setting Pacing Pulse Width: 0.4 ms
Lead Channel Setting Sensing Sensitivity: 2.8 mV

## 2020-02-17 DIAGNOSIS — Z7901 Long term (current) use of anticoagulants: Secondary | ICD-10-CM | POA: Diagnosis not present

## 2020-02-17 IMAGING — CT CT ANGIOGRAPHY CHEST
1 series · 18 of 32 positions shown · IV contrast (APPLIED)
Comparison: Coronary CTA on 10/24/2017

CLINICAL DATA: Follow-up of dilatation of the ascending thoracic
aorta.

EXAM:
CT ANGIOGRAPHY CHEST WITH CONTRAST
TECHNIQUE: Multidetector CT imaging of the chest was performed using the
standard protocol during bolus administration of intravenous
contrast. Multiplanar CT image reconstructions and MIPs were
obtained to evaluate the vascular anatomy.
CONTRAST:  75mL 0SX4NE-C1W IOPAMIDOL (0SX4NE-C1W) INJECTION 76%
Creatinine was obtained on site at [HOSPITAL] at [HOSPITAL].
Results: Creatinine 1.1 mg/dL.  Estimated GFR 63 mL/minute.

[Series 4: chest angio · axial · 0.66mm/px · z∈[-300,-9]mm · 18 of 105 slices shown]
[im 4/105  lung]
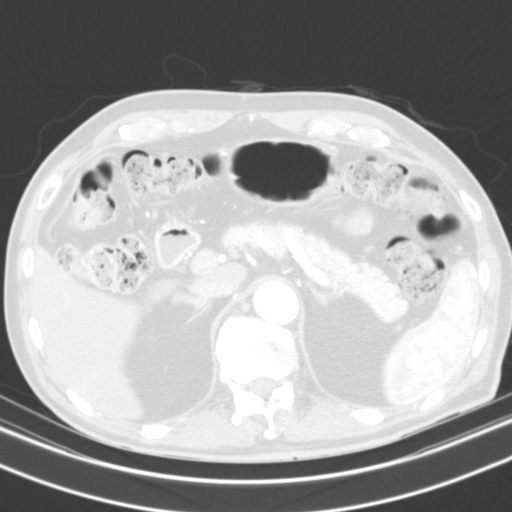
[im 11/105  soft-tissue]
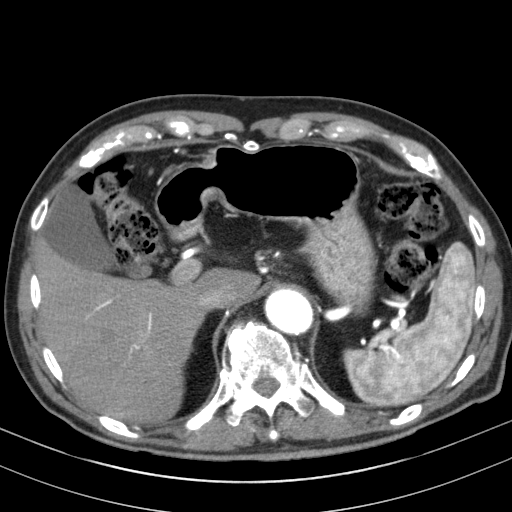
[im 17/105  lung]
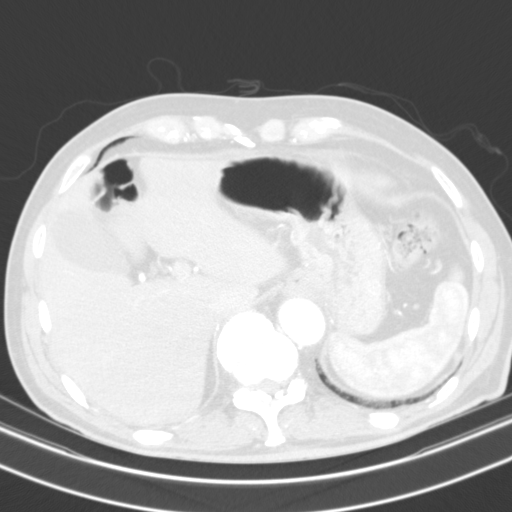
[im 21/105  soft-tissue]
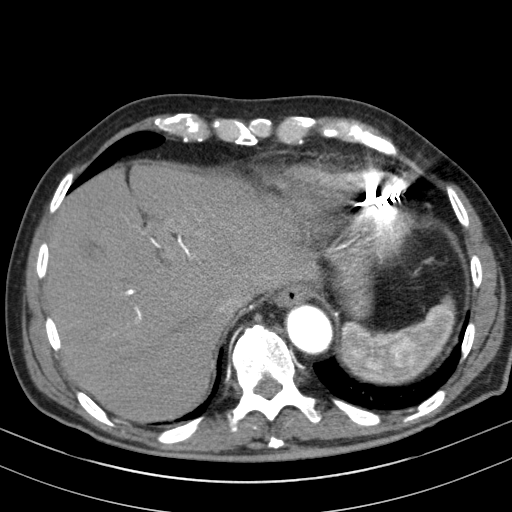
[im 27/105  lung]
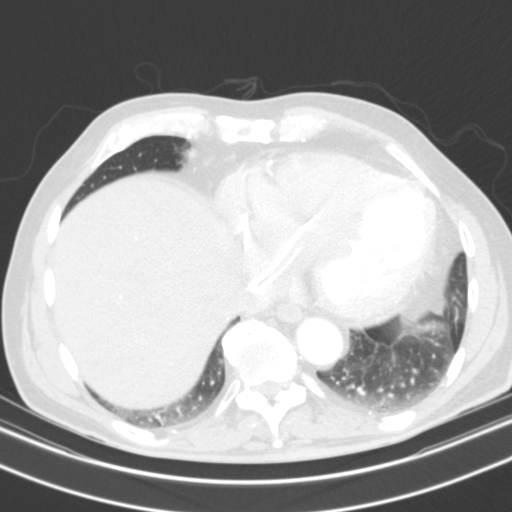
[im 34/105  soft-tissue]
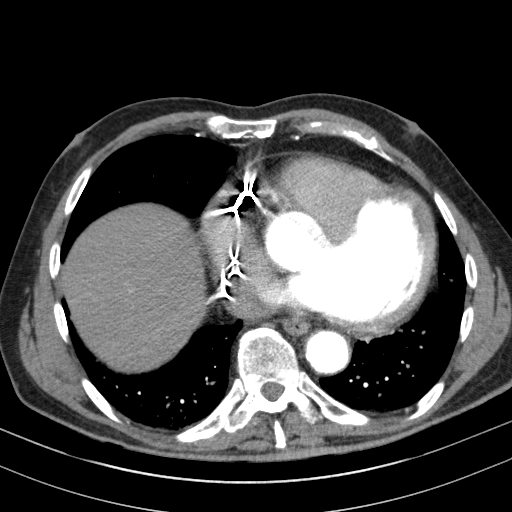
[im 37/105  lung]
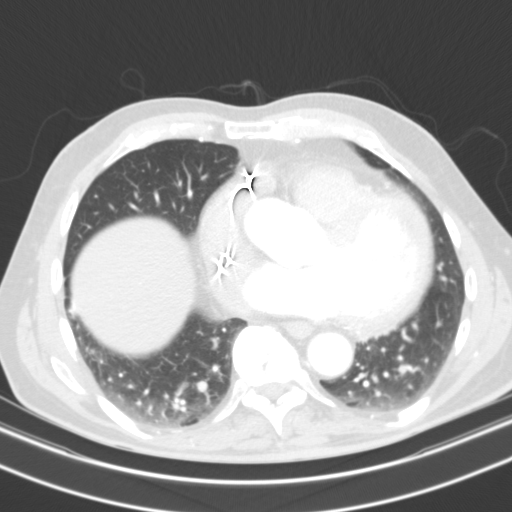
[im 44/105  soft-tissue]
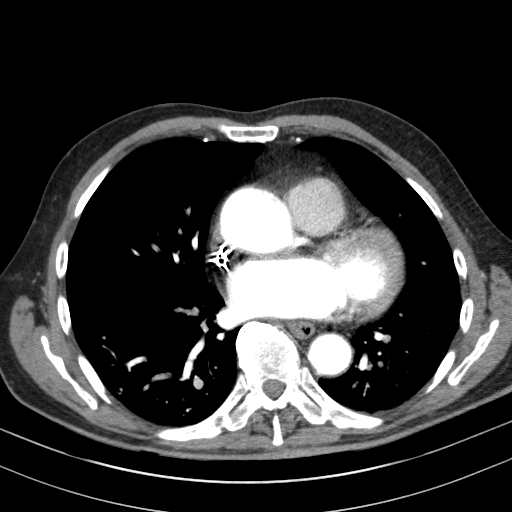
[im 51/105  lung]
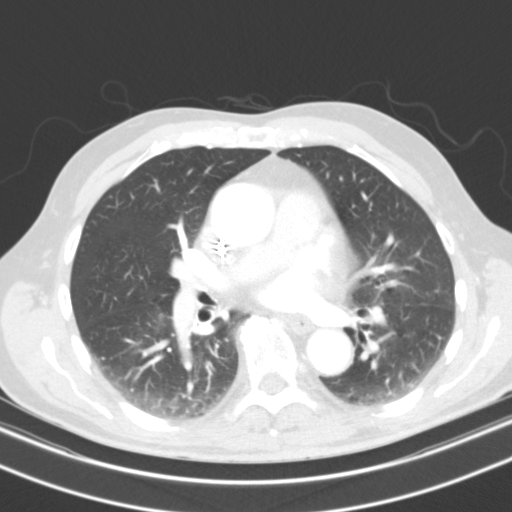
[im 54/105  soft-tissue]
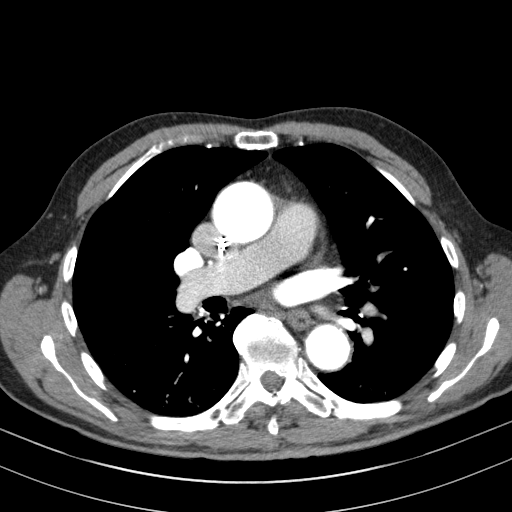
[im 61/105  lung]
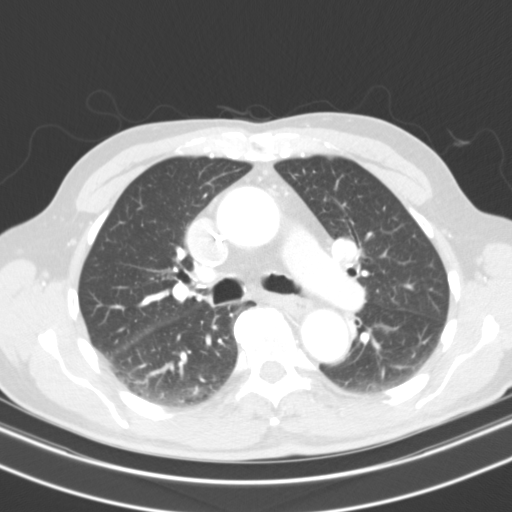
[im 68/105  soft-tissue]
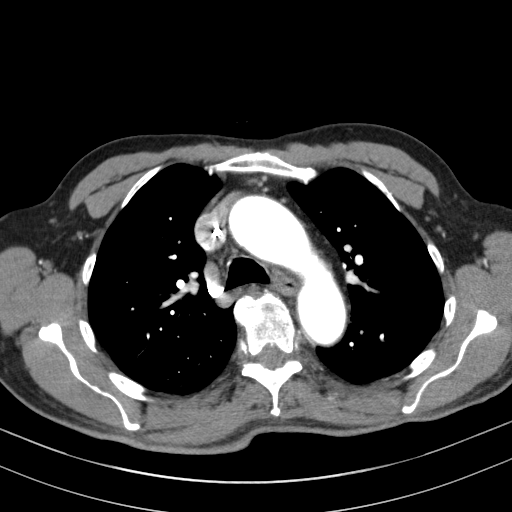
[im 71/105  lung]
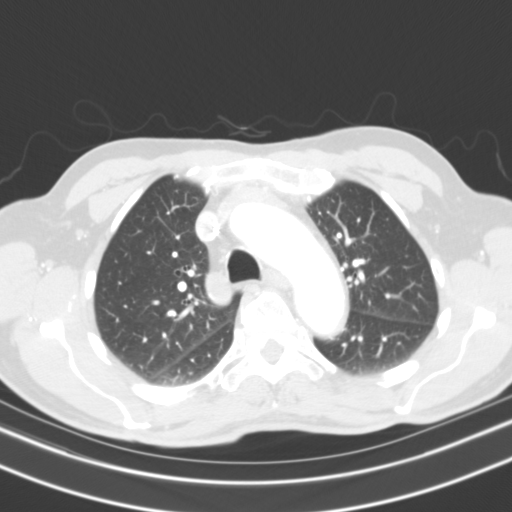
[im 78/105  soft-tissue]
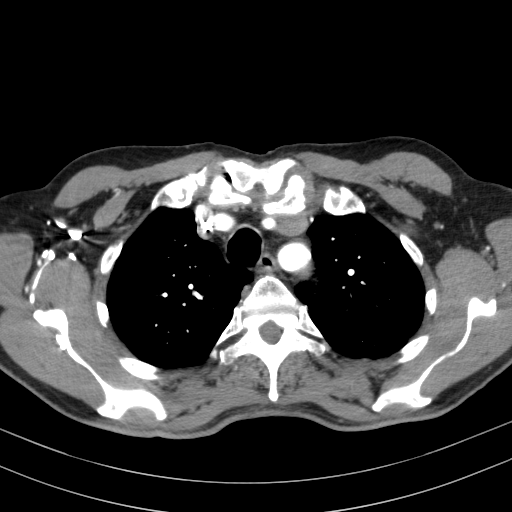
[im 84/105  lung]
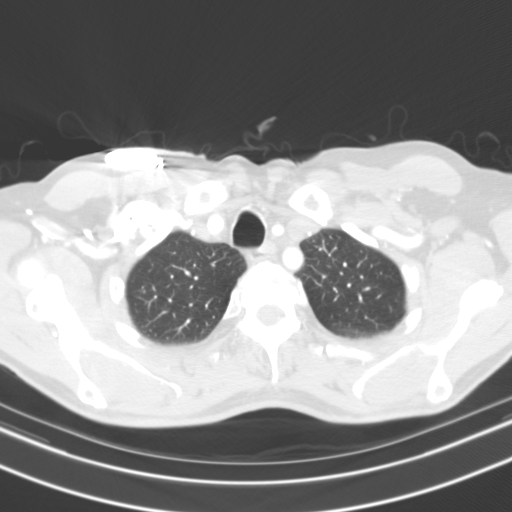
[im 88/105  soft-tissue]
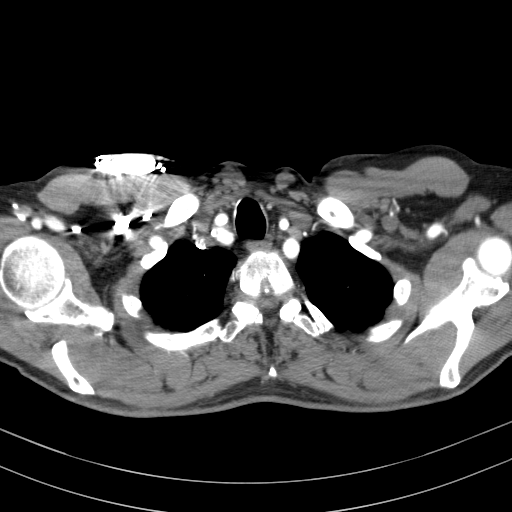
[im 94/105  lung]
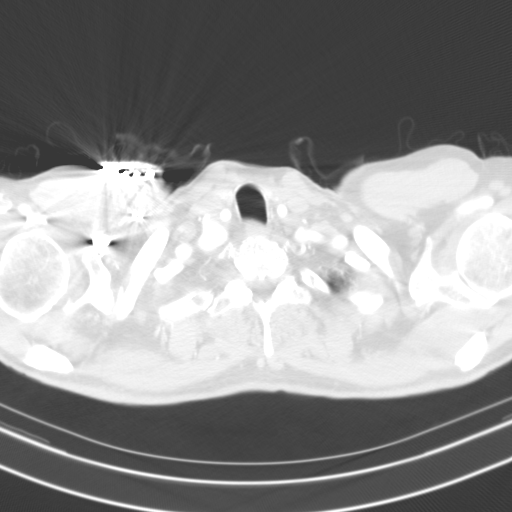
[im 101/105  soft-tissue]
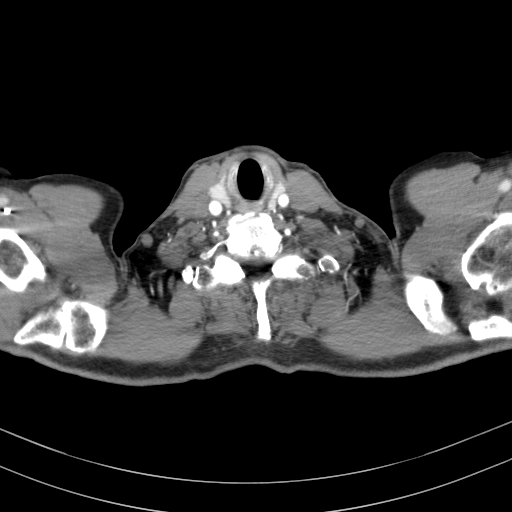

[18 of 32 positions shown; findings below may reference images not displayed]

FINDINGS: Cardiovascular: The aortic root is normal in caliber at the level of
the sinuses of Valsalva and measures approximately 3.5-3.7 cm. The
ascending thoracic aorta shows stable mild dilatation and measures 4
cm in greatest diameter. The proximal arch measures 3.5 cm and the
distal arch 3.0 cm. The descending thoracic aorta measures 2.7 cm.
There is no evidence of aortic dissection. Origin of the left
subclavian artery is dilated and measures 1.9 cm. The artery then
tapers to a normal caliber. The proximal right subclavian artery
demonstrates mild focal dilatation measuring 1.6 cm near origins of
the internal mammary and vertebral arteries.

Stable cardiac enlargement. No pericardial fluid identified. Stable
appearance of pacemaker. Central pulmonary arteries are normal in
caliber.

Mediastinum/Nodes: No enlarged mediastinal, hilar, or axillary lymph
nodes. Thyroid gland, trachea, and esophagus demonstrate no
significant findings.

Lungs/Pleura: There is no evidence of pulmonary edema,
consolidation, pneumothorax, nodule or pleural fluid.

Upper Abdomen: No acute abnormality.

Musculoskeletal: No chest wall abnormality. No acute or significant
osseous findings.

Review of the MIP images confirms the above findings.
IMPRESSION: Stable mild aneurysmal dilatation of the ascending thoracic aorta
measuring 4.0 cm in maximal diameter. Mild dilatation of proximal
subclavian arteries also present bilaterally, left greater than
right.

Recommend annual imaging followup by CTA or MRA. This recommendation
follows 7444 ACCF/AHA/AATS/ACR/ASA/SCA/ETOIL/YULI PATRICIA/ADLAHO/HIRO Guidelines
for the Diagnosis and Management of Patients with Thoracic Aortic
Disease. Circulation. 7444; 121: E266-e369. Aortic aneurysm NOS
(WSMZ5-RK0.G)

Aortic aneurysm NOS (WSMZ5-RK0.G).

## 2020-02-17 NOTE — Progress Notes (Signed)
Remote pacemaker transmission.   

## 2020-03-11 DIAGNOSIS — L820 Inflamed seborrheic keratosis: Secondary | ICD-10-CM | POA: Diagnosis not present

## 2020-03-11 DIAGNOSIS — Z85828 Personal history of other malignant neoplasm of skin: Secondary | ICD-10-CM | POA: Diagnosis not present

## 2020-03-11 DIAGNOSIS — L821 Other seborrheic keratosis: Secondary | ICD-10-CM | POA: Diagnosis not present

## 2020-03-11 DIAGNOSIS — D225 Melanocytic nevi of trunk: Secondary | ICD-10-CM | POA: Diagnosis not present

## 2020-03-11 DIAGNOSIS — L720 Epidermal cyst: Secondary | ICD-10-CM | POA: Diagnosis not present

## 2020-03-11 DIAGNOSIS — D1801 Hemangioma of skin and subcutaneous tissue: Secondary | ICD-10-CM | POA: Diagnosis not present

## 2020-03-15 DIAGNOSIS — Z7901 Long term (current) use of anticoagulants: Secondary | ICD-10-CM | POA: Diagnosis not present

## 2020-03-17 DIAGNOSIS — Z23 Encounter for immunization: Secondary | ICD-10-CM | POA: Diagnosis not present

## 2020-04-14 DIAGNOSIS — F329 Major depressive disorder, single episode, unspecified: Secondary | ICD-10-CM | POA: Diagnosis not present

## 2020-04-14 DIAGNOSIS — I2510 Atherosclerotic heart disease of native coronary artery without angina pectoris: Secondary | ICD-10-CM | POA: Diagnosis not present

## 2020-04-14 DIAGNOSIS — M5136 Other intervertebral disc degeneration, lumbar region: Secondary | ICD-10-CM | POA: Diagnosis not present

## 2020-04-14 DIAGNOSIS — I2699 Other pulmonary embolism without acute cor pulmonale: Secondary | ICD-10-CM | POA: Diagnosis not present

## 2020-04-14 DIAGNOSIS — Z950 Presence of cardiac pacemaker: Secondary | ICD-10-CM | POA: Diagnosis not present

## 2020-04-14 DIAGNOSIS — K5900 Constipation, unspecified: Secondary | ICD-10-CM | POA: Diagnosis not present

## 2020-04-14 DIAGNOSIS — I10 Essential (primary) hypertension: Secondary | ICD-10-CM | POA: Diagnosis not present

## 2020-04-14 DIAGNOSIS — I480 Paroxysmal atrial fibrillation: Secondary | ICD-10-CM | POA: Diagnosis not present

## 2020-04-14 DIAGNOSIS — N401 Enlarged prostate with lower urinary tract symptoms: Secondary | ICD-10-CM | POA: Diagnosis not present

## 2020-04-14 DIAGNOSIS — I509 Heart failure, unspecified: Secondary | ICD-10-CM | POA: Diagnosis not present

## 2020-04-14 DIAGNOSIS — D696 Thrombocytopenia, unspecified: Secondary | ICD-10-CM | POA: Diagnosis not present

## 2020-04-14 DIAGNOSIS — E785 Hyperlipidemia, unspecified: Secondary | ICD-10-CM | POA: Diagnosis not present

## 2020-04-27 DIAGNOSIS — Z7901 Long term (current) use of anticoagulants: Secondary | ICD-10-CM | POA: Diagnosis not present

## 2020-05-12 ENCOUNTER — Ambulatory Visit (INDEPENDENT_AMBULATORY_CARE_PROVIDER_SITE_OTHER): Payer: Medicare Other

## 2020-05-12 DIAGNOSIS — I495 Sick sinus syndrome: Secondary | ICD-10-CM

## 2020-05-12 DIAGNOSIS — R55 Syncope and collapse: Secondary | ICD-10-CM | POA: Diagnosis not present

## 2020-05-13 LAB — CUP PACEART REMOTE DEVICE CHECK
Battery Remaining Longevity: 43 mo
Battery Voltage: 2.98 V
Brady Statistic AP VP Percent: 46.76 %
Brady Statistic AP VS Percent: 38.66 %
Brady Statistic AS VP Percent: 8.38 %
Brady Statistic AS VS Percent: 6.2 %
Brady Statistic RA Percent Paced: 84.48 %
Brady Statistic RV Percent Paced: 54.86 %
Date Time Interrogation Session: 20211124074603
Implantable Lead Implant Date: 20060223
Implantable Lead Implant Date: 20060223
Implantable Lead Location: 753859
Implantable Lead Location: 753860
Implantable Lead Model: 5076
Implantable Lead Model: 5076
Implantable Pulse Generator Implant Date: 20151125
Lead Channel Impedance Value: 342 Ohm
Lead Channel Impedance Value: 399 Ohm
Lead Channel Impedance Value: 456 Ohm
Lead Channel Impedance Value: 456 Ohm
Lead Channel Pacing Threshold Amplitude: 0.625 V
Lead Channel Pacing Threshold Amplitude: 0.875 V
Lead Channel Pacing Threshold Pulse Width: 0.4 ms
Lead Channel Pacing Threshold Pulse Width: 0.4 ms
Lead Channel Sensing Intrinsic Amplitude: 2.25 mV
Lead Channel Sensing Intrinsic Amplitude: 2.25 mV
Lead Channel Sensing Intrinsic Amplitude: 4 mV
Lead Channel Sensing Intrinsic Amplitude: 4 mV
Lead Channel Setting Pacing Amplitude: 2 V
Lead Channel Setting Pacing Amplitude: 2.5 V
Lead Channel Setting Pacing Pulse Width: 0.4 ms
Lead Channel Setting Sensing Sensitivity: 2.8 mV

## 2020-05-19 NOTE — Progress Notes (Signed)
Remote pacemaker transmission.   

## 2020-05-21 DIAGNOSIS — Z7901 Long term (current) use of anticoagulants: Secondary | ICD-10-CM | POA: Diagnosis not present

## 2020-06-07 DIAGNOSIS — Z7901 Long term (current) use of anticoagulants: Secondary | ICD-10-CM | POA: Diagnosis not present

## 2020-06-08 DIAGNOSIS — I5022 Chronic systolic (congestive) heart failure: Secondary | ICD-10-CM | POA: Insufficient documentation

## 2020-06-09 ENCOUNTER — Other Ambulatory Visit: Payer: Self-pay

## 2020-06-09 ENCOUNTER — Encounter: Payer: Self-pay | Admitting: Internal Medicine

## 2020-06-09 ENCOUNTER — Ambulatory Visit (INDEPENDENT_AMBULATORY_CARE_PROVIDER_SITE_OTHER): Payer: Medicare Other | Admitting: Internal Medicine

## 2020-06-09 VITALS — BP 126/74 | HR 65 | Ht 75.0 in | Wt 174.8 lb

## 2020-06-09 DIAGNOSIS — R0602 Shortness of breath: Secondary | ICD-10-CM | POA: Diagnosis not present

## 2020-06-09 DIAGNOSIS — I495 Sick sinus syndrome: Secondary | ICD-10-CM

## 2020-06-09 DIAGNOSIS — Z950 Presence of cardiac pacemaker: Secondary | ICD-10-CM | POA: Diagnosis not present

## 2020-06-09 DIAGNOSIS — I447 Left bundle-branch block, unspecified: Secondary | ICD-10-CM

## 2020-06-09 DIAGNOSIS — I5022 Chronic systolic (congestive) heart failure: Secondary | ICD-10-CM

## 2020-06-09 LAB — CUP PACEART INCLINIC DEVICE CHECK
Battery Remaining Longevity: 44 mo
Battery Voltage: 2.98 V
Brady Statistic AP VP Percent: 34.92 %
Brady Statistic AP VS Percent: 51.87 %
Brady Statistic AS VP Percent: 5.26 %
Brady Statistic AS VS Percent: 7.95 %
Brady Statistic RA Percent Paced: 85.99 %
Brady Statistic RV Percent Paced: 40.29 %
Date Time Interrogation Session: 20211222143900
Implantable Lead Implant Date: 20060223
Implantable Lead Implant Date: 20060223
Implantable Lead Location: 753859
Implantable Lead Location: 753860
Implantable Lead Model: 5076
Implantable Lead Model: 5076
Implantable Pulse Generator Implant Date: 20151125
Lead Channel Impedance Value: 380 Ohm
Lead Channel Impedance Value: 399 Ohm
Lead Channel Impedance Value: 475 Ohm
Lead Channel Impedance Value: 494 Ohm
Lead Channel Pacing Threshold Amplitude: 0.5 V
Lead Channel Pacing Threshold Amplitude: 1 V
Lead Channel Pacing Threshold Pulse Width: 0.4 ms
Lead Channel Pacing Threshold Pulse Width: 0.4 ms
Lead Channel Sensing Intrinsic Amplitude: 2.875 mV
Lead Channel Sensing Intrinsic Amplitude: 4 mV
Lead Channel Setting Pacing Amplitude: 2 V
Lead Channel Setting Pacing Amplitude: 2.5 V
Lead Channel Setting Pacing Pulse Width: 0.4 ms
Lead Channel Setting Sensing Sensitivity: 2.8 mV

## 2020-06-09 NOTE — Progress Notes (Addendum)
Patient Care Team: Crist Infante, MD as PCP - General   HPI  Steven Macdonald is a 85 y.o. male Seen in followup for AFlutter s/p RFCA He has had no recurrent arrhythmia.  He had a previously implanted dual-chamber Medtronic pacemaker 2006  for sinus node dysfunction  He underwent generator replacement 11/15  Over the last year and a half he has had significant loss of physical stamina; this despite the fact that his depression has improved significantly.  Things like mowing the lawn have become chores.  He sits around a bunch more than he has.  No edema nocturnal dyspnea orthopnea.  No chest pain.    DATE TEST EF   10/09 CTA  Ca score 0  12/13 Echo   25 %   1/15 Myoview  Multiple perfusion defects   1/15 Echo   45-50 %   10/16 Echo  45-50%   11/20 Echo  40-45%   6/21 CTA  4.1cm Aortic root   He has a history of pulmonary embolism and is on Coumadin.     Date Cr K Hgb  10/18   14.5   11/20 0.99 4.0 14.2             Past Medical History:  Diagnosis Date  . Arthritis   . Atrial flutter ([place])    RFCA   . BPH (benign prostatic hypertrophy)   . Cardiomyopathy, nonischemic -resolved    Echo 2008: EF 25% Cardiac CT 2009: Normal cors EF 31% Echo 3/10 EF 45% Echo 6/13: EF 50-55%    . CHF (congestive heart failure) ([place])   . Chicken pox   . Colon polyps    hyperplastic  . Diabetes mellitus ([place])   . ED (erectile dysfunction)   . Gilbert's syndrome    possible  . Hemorrhoids   . Herniated cervical disc   . HLD (hyperlipidemia)   . HTN (hypertension)   . LBBB (left bundle branch block)   . Orthostatic hypotension   . Pacemaker  Medtronic    DOI 2/06  . 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 Pulmonary embolism ([place])   . Sinus node dysfunction [facility])         Past Surgical History:  Procedure Laterality Date  . CARDIOVERSION    . coronary ablation    . CYSTOSCOPY W/ TRANSURETHRAL RESECTION OF POSTERIOR URETHERAL VALVES    . PACEMAKER GENERATOR CHANGE N/A  05/13/2014   Procedure: PACEMAKER GENERATOR CHANGE;  Surgeon: Deboraha Sprang, MD;  Location: [facility] CATH LAB;  Service: Cardiovascular;  Laterality: N/A;  . PACEMAKER INSERTION      Current Outpatient Medications  Medication Sig Dispense Refill  . Calcium-Magnesium-Vitamin D (CALCIUM 500 PO) Take 1 tablet by mouth every morning.     . cholecalciferol (VITAMIN D) 1000 UNITS tablet Take 1,000 Units by mouth every morning.     . Cyanocobalamin (VITAMIN B-12 PO) Take 1 tablet by mouth every morning.    . finasteride (PROSCAR) 5 MG tablet Take 5 mg by mouth every morning.     . fish oil-omega-3 fatty acids 1000 MG capsule Take 1 g by mouth every morning.     . mirtazapine (REMERON) 15 MG tablet Take 15 mg by mouth at bedtime.    . polyethylene glycol (MIRALAX / GLYCOLAX) 17 g packet Take 17 g by mouth daily as needed (constipation).    Marland Kitchen PRESCRIPTION MEDICATION Apply 1 application topically See admin instructions. Dermatological intment for wound care -  apply to tailbone daily as needed for sores    . Probiotic Product (ALIGN PO) Take 1 tablet by mouth every morning.     . propafenone (RYTHMOL SR) 225 MG 12 hr capsule Take 1 capsule (225 mg total) by mouth every 12 (twelve) hours. 180 capsule 3  . PSYLLIUM PO Take 10 mLs by mouth daily before breakfast. Mix in 8 oz liquid and drink    . senna-docusate (SENOKOT-S) 8.6-50 MG tablet Take 2 tablets by mouth 2 (two) times daily as needed for mild constipation. 90 tablet 0  . sildenafil (REVATIO) 20 MG tablet Take 100 mg by mouth daily as needed (erectile dysfunction).    . simvastatin (ZOCOR) 40 MG tablet Take 20 mg by mouth 2 (two) times daily.    . tamsulosin (FLOMAX) 0.4 MG CAPS capsule Take 0.4 mg by mouth every morning.     . warfarin (COUMADIN) 5 MG tablet Take 7.5-10 mg by mouth See admin instructions. Take 2 tablets (10 mg) by mouth on Monday, Wednesday, Friday night, take 1 1 /2 tablets (7.5 mg) on Sunday, Tuesday, Thursday, Saturday night     No  current facility-administered medications for this visit.    No Known Allergies  Review of Systems negative except from HPI and PMH  Physical Exam BP 126/74   Pulse 65   Ht 6\' 3"  (1.905 m)   Wt 174 lb 12.8 oz (79.3 kg)   SpO2 98%   BMI 21.85 kg/m  Well developed and well nourished in no acute distress HENT normal Neck supple with JVP-6-8 Clear Device pocket well healed; without hematoma or erythema.  There is no tethering  Regular rate and rhythm, no  gallop 2/6 apical murmur; sustained modestly displaced PMI Abd-soft with active BS No Clubbing cyanosis no edema Skin-warm and dry A & Oriented  Grossly normal sensory and motor function  ECG sinus with P synchronous pacing  Assessment and  Plan  Sinus node dysfunction  Dypsnea on exertion  Pacemaker-Medtronic .The patient's device was interrogated.  The information was reviewed. No changes were made in the programming.     Nonischemic cardiomyopathy-resolved  CHF chronic systolic  LBBB   Pulmonary Embolism  Chronic therapy  Depression  Increasing ventricular pacing  Syncope  2/2 atrial tachycardia >> propafenone   Increasing fatigue and lassitude.  Differential diagnosis is broad.  Metabolic parameters, specifically TSH and hemoglobin were normal 4/21 and the symptoms go back about 18 months although they are worsening.  He has concurrent with this increasing left ventricular pacing percentages.  Not quite sure the reason for that.  The possibility is that it could be aggravating his form of cardiomyopathy.  We will check an echocardiogram.  Calcium score just a couple years ago was 0.  Unlikely that this is ischemia  Without dyspnea, it is hard to implicate recurrent pulmonary embolism.  In his estimation his depression is significantly better not withstanding the worsening of his physical lassitude.

## 2020-06-09 NOTE — Patient Instructions (Signed)
Medication Instructions:  Your physician recommends that you continue on your current medications as directed. Please refer to the Current Medication list given to you today.  *If you need a refill on your cardiac medications before your next appointment, please call your pharmacy*   Lab Work: None ordered.  If you have labs (blood work) drawn today and your tests are completely normal, you will receive your results only by: . MyChart Message (if you have MyChart) OR . A paper copy in the mail If you have any lab test that is abnormal or we need to change your treatment, we will call you to review the results.   Testing/Procedures: Your physician has requested that you have an echocardiogram. Echocardiography is a painless test that uses sound waves to create images of your heart. It provides your doctor with information about the size and shape of your heart and how well your heart's chambers and valves are working. This procedure takes approximately one hour. There are no restrictions for this procedure.      Follow-Up: At [facility], you and your health needs are our priority.  As part of our continuing mission to provide you with exceptional heart care, we have created designated Provider Care Teams.  These Care Teams include your primary Cardiologist (physician) and Advanced Practice Providers (APPs -  Physician Assistants and Nurse Practitioners) who all work together to provide you with the care you need, when you need it.  We recommend signing up for the patient portal called "MyChart".  Sign up information is provided on this After Visit Summary.  MyChart is used to connect with patients for Virtual Visits (Telemedicine).  Patients are able to view lab/test results, encounter notes, upcoming appointments, etc.  Non-urgent messages can be sent to your provider as well.   To learn more about what you can do with MyChart, go to [email or web address].    Your next appointment:    12 months  The format for your next appointment:   In Person  Provider:   Steven Klein, MD     

## 2020-06-14 ENCOUNTER — Encounter: Payer: Self-pay | Admitting: Orthopaedic Surgery

## 2020-06-14 ENCOUNTER — Ambulatory Visit (INDEPENDENT_AMBULATORY_CARE_PROVIDER_SITE_OTHER): Payer: Medicare Other | Admitting: Orthopaedic Surgery

## 2020-06-14 ENCOUNTER — Ambulatory Visit (INDEPENDENT_AMBULATORY_CARE_PROVIDER_SITE_OTHER): Payer: Medicare Other

## 2020-06-14 DIAGNOSIS — M1711 Unilateral primary osteoarthritis, right knee: Secondary | ICD-10-CM | POA: Insufficient documentation

## 2020-06-14 DIAGNOSIS — M25561 Pain in right knee: Secondary | ICD-10-CM

## 2020-06-14 MED ORDER — METHYLPREDNISOLONE ACETATE 40 MG/ML IJ SUSP
40.0000 mg | INTRAMUSCULAR | Status: AC | PRN
  Administered 2020-06-14: 09:00:00 40 mg via INTRA_ARTICULAR

## 2020-06-14 MED ORDER — LIDOCAINE HCL 1 % IJ SOLN
3.0000 mL | INTRAMUSCULAR | Status: AC | PRN
  Administered 2020-06-14: 09:00:00 3 mL

## 2020-06-14 NOTE — Progress Notes (Signed)
Office Visit Note   Patient: Steven Macdonald           Date of Birth: May 21, 1935           MRN: [id number] Visit Date: 06/14/2020              Requested by: Rodrigo Ran, MD [address] [place],  [place] [address] PCP: Rodrigo Ran, MD   Assessment & Plan: Visit Diagnoses:  1. Right knee pain, unspecified chronicity   2. Unilateral primary osteoarthritis, right knee     Plan: These findings as well as signs and symptoms are consistent with an arthritic knee and pain from an arthritic right knee.  I did recommend his exercise and walking routine avoid hills.  Also recommend a steroid injection in his knee today and described the risk and benefits of injections.  He agreed to this and tolerated it well.  He may be a candidate for hyaluronic acid at some point.  We would like to see him back in 4 weeks to see how he is doing overall.  All questions and concerns were answered and addressed.  Follow-Up Instructions: Return in about 4 weeks (around 07/12/2020).   Orders:  Orders Placed This Encounter  Procedures  . Large Joint Inj  . XR Knee 1-2 Views Right   No orders of the defined types were placed in this encounter.     Procedures: Large Joint Inj on 06/14/2020 9:06 AM Indications: diagnostic evaluation and pain Details: 22 G 1.5 in needle, superolateral approach  Arthrogram: No  Medications: 3 mL lidocaine 1 %; 40 mg methylPREDNISolone acetate 40 MG/ML Outcome: tolerated well, no immediate complications Procedure, treatment alternatives, risks and benefits explained, specific risks discussed. Consent was given by the patient. Immediately prior to procedure a time out was called to verify the correct patient, procedure, equipment, support staff and site/side marked as required. Patient was prepped and draped in the usual sterile fashion.       Clinical Data: No additional findings.   Subjective: Chief Complaint  Patient presents with  . Right Knee - Pain  The  patient is an 85 year old gentleman who comes in today with right knee pain is been hurting for about 3 months now and slowly getting worse over the last couple weeks.  He says walking does increase his pain and he likes to exercise walk.  He has no known injury and is not had surgery on his knee before he has had to stop walking due to the pain.  He is not a diabetic.  He denies any injury that he is aware of.  HPI  Review of Systems He currently denies any headache, chest pain, shortness of breath, fever, chills, nausea, vomiting  Objective: Vital Signs: There were no vitals taken for this visit.  Physical Exam He is alert and orient x3 and in no acute distress Ortho Exam Examination of his right knee shows both knees have excellent flexion contracture.  There is no effusion there is some global tenderness throughout flexion and extension.  His knee is ligamentously stable. Specialty Comments:  No specialty comments available.  Imaging: XR Knee 1-2 Views Right  Result Date: 06/14/2020 2 views of the right knee show no acute findings.  There is moderate to significant tricompartment arthritis with medial joint space narrowing and patellofemoral narrowing.  There are osteophytes in all 3 compartments    PMFS History: Patient Active Problem List   Diagnosis Date Noted  . Unilateral primary osteoarthritis, right  knee 06/14/2020  . Chronic systolic CHF (congestive heart failure) ([place]) 06/08/2020  . Syncope and collapse 05/12/2019  . SVT (supraventricular tachycardia) ([place]) 05/11/2019  . Hemothorax on left 05/11/2019  . Pneumothorax, closed, traumatic, initial encounter 05/11/2019  . Multiple closed fractures of ribs of left side 05/11/2019  . Left pulmonary contusion 05/11/2019  . Acute left-sided low back pain without sciatica 06/04/2017  . Benign prostatic hyperplasia 04/25/2017  . Precordial chest pain 07/17/2013  . Pulmonary embolism ([place]) 10/17/2012  . Nonischemic  cardiomyopathy ([place]) 06/04/2012  . Pacemaker-Medtronic 04/11/2011  . HYPERTENSION, BENIGN 05/24/2009  . LBBB (left bundle branch block) 10/06/2008  . SICK SINUS/ TACHY-BRADY SYNDROME 10/06/2008  . Syncope due to sick sinus syndrome ([place]) 10/06/2008  . HYPERLIPIDEMIA 08/27/2008  . CARDIOMYOPATHY--resolved 08/27/2008   Past Medical History:  Diagnosis Date  . Arthritis   . Atrial flutter ([place])    RFCA   . BPH (benign prostatic hypertrophy)   . Cardiomyopathy, nonischemic -resolved    Echo 2008: EF 25% Cardiac CT 2009: Normal cors EF 31% Echo 3/10 EF 45% Echo 6/13: EF 50-55%    . CHF (congestive heart failure) ([place])   . Chicken pox   . Colon polyps    hyperplastic  . Diabetes mellitus ([place])   . ED (erectile dysfunction)   . Gilbert's syndrome    possible  . Hemorrhoids   . Herniated cervical disc   . HLD (hyperlipidemia)   . HTN (hypertension)   . LBBB (left bundle branch block)   . Orthostatic hypotension   . Pacemaker  Medtronic    DOI 2/06  . 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 Pulmonary embolism ([place])   . Sinus node dysfunction ([facility])         Family History  Problem Relation Age of Onset  . Breast cancer Mother   . Colon polyps Father        pre-cancerous  . Bladder Cancer Father     Past Surgical History:  Procedure Laterality Date  . CARDIOVERSION    . coronary ablation    . CYSTOSCOPY W/ TRANSURETHRAL RESECTION OF POSTERIOR URETHERAL VALVES    . PACEMAKER GENERATOR CHANGE N/A 05/13/2014   Procedure: PACEMAKER GENERATOR CHANGE;  Surgeon: Deboraha Sprang, MD;  Location: [facility] CATH LAB;  Service: Cardiovascular;  Laterality: N/A;  . PACEMAKER INSERTION     Social History   Occupational History  . Occupation: Insurance underwriter: SELF-EMPLOYED  Tobacco Use  . Smoking status: Former Smoker    Quit date: 10/17/1970    Years since quitting: 49.6  . Smokeless tobacco: Never Used  Vaping Use  . Vaping Use: Never used  Substance and Sexual Activity  . Alcohol use:  Yes    Comment: 2 glasses wine daily  . Drug use: No  . Sexual activity: Not on file

## 2020-07-06 DIAGNOSIS — Z7901 Long term (current) use of anticoagulants: Secondary | ICD-10-CM | POA: Diagnosis not present

## 2020-07-09 ENCOUNTER — Ambulatory Visit (HOSPITAL_COMMUNITY): Payer: Medicare Other | Attending: Cardiology

## 2020-07-09 ENCOUNTER — Other Ambulatory Visit: Payer: Self-pay

## 2020-07-09 DIAGNOSIS — R0602 Shortness of breath: Secondary | ICD-10-CM

## 2020-07-09 LAB — ECHOCARDIOGRAM COMPLETE
AR max vel: 2.41 cm2
AV Area VTI: 2.3 cm2
AV Area mean vel: 2.2 cm2
AV Mean grad: 3 mmHg
AV Peak grad: 6.4 mmHg
Ao pk vel: 1.26 m/s
Area-P 1/2: 3.34 cm2
Calc EF: 39.9 %
MV M vel: 5.01 m/s
MV Peak grad: 100.4 mmHg
P 1/2 time: 650 msec
S' Lateral: 4.8 cm
Single Plane A2C EF: 37.7 %
Single Plane A4C EF: 40 %

## 2020-07-12 ENCOUNTER — Ambulatory Visit (INDEPENDENT_AMBULATORY_CARE_PROVIDER_SITE_OTHER): Payer: Medicare Other | Admitting: Orthopaedic Surgery

## 2020-07-12 ENCOUNTER — Encounter: Payer: Self-pay | Admitting: Orthopaedic Surgery

## 2020-07-12 ENCOUNTER — Telehealth: Payer: Self-pay

## 2020-07-12 DIAGNOSIS — M25561 Pain in right knee: Secondary | ICD-10-CM | POA: Diagnosis not present

## 2020-07-12 DIAGNOSIS — M1711 Unilateral primary osteoarthritis, right knee: Secondary | ICD-10-CM | POA: Diagnosis not present

## 2020-07-12 NOTE — Telephone Encounter (Signed)
Right knee gel injection  

## 2020-07-12 NOTE — Progress Notes (Signed)
The patient is a 86 year old with moderate arthritis of his right knee.  We saw him 4 weeks ago we did place a steroid injection in the right knee and he says that has helped but his pain is not gone completely.  He is a very slow walking 86 year old gentleman.  He has been working on quad strengthening exercises and I demonstrated them to him and he demonstrated in the back to me.  Examination of his right knee shows no significant effusion.  There is medial lateral joint line tenderness and he does have a slight flexion contracture.  Previous x-rays do show moderate tricompartment arthritis of the right knee.  At this point I do feel that he is an excellent candidate for hyaluronic acid in the right knee to treat the osteoarthritis pain that he is experiencing.  I explained the risk and benefits of these types of injections and the rationale behind ordering it for his knee.  He agrees with this treatment plan.  We will order that for him and hopefully see him back soon to place that in his right knee.  All questions and concerns were answered and addressed.

## 2020-07-12 NOTE — Telephone Encounter (Signed)
Submitted for VOB for Monovisc-Right knee 

## 2020-07-13 ENCOUNTER — Telehealth: Payer: Self-pay

## 2020-07-13 NOTE — Telephone Encounter (Signed)
Called and scheduled

## 2020-07-13 NOTE — Telephone Encounter (Signed)
Approved for Monovisc-Right knee Dr. Margarito Liner and [facility] No Copay Covered @ 100%-[employer] to pick up cost after medicare No prior auth required   OK to schedule @ next available

## 2020-07-19 ENCOUNTER — Encounter: Payer: Self-pay | Admitting: Physician Assistant

## 2020-07-19 ENCOUNTER — Ambulatory Visit (INDEPENDENT_AMBULATORY_CARE_PROVIDER_SITE_OTHER): Payer: Medicare Other | Admitting: Physician Assistant

## 2020-07-19 DIAGNOSIS — M1711 Unilateral primary osteoarthritis, right knee: Secondary | ICD-10-CM

## 2020-07-19 MED ORDER — HYALURONAN 88 MG/4ML IX SOSY
88.0000 mg | PREFILLED_SYRINGE | INTRA_ARTICULAR | Status: AC | PRN
  Administered 2020-07-19: 88 mg via INTRA_ARTICULAR

## 2020-07-19 NOTE — Progress Notes (Signed)
   Procedure Note  Patient: Steven Macdonald             Date of Birth: February 15, 1935           MRN: [id number]             Visit Date: 07/19/2020  HPI: Mr. Duerson comes in today for Monovisc injection in his right knee. He has known osteoarthritis 9. He has had no new injury. Does state that the pain in the knee has improved since undergoing a cortisone injection in the knee some 6 weeks ago.  Right knee: No abnormal warmth erythema. He lacks full extension of the knee by few degrees. Good flexion without pain.  Procedures: Visit Diagnoses:  1. Unilateral primary osteoarthritis, right knee     Large Joint Inj: R knee on 07/19/2020 11:22 AM Indications: pain Details: 22 G 1.5 in needle, anterolateral approach  Arthrogram: No  Medications: 88 mg Hyaluronan 88 MG/4ML Outcome: tolerated well, no immediate complications Procedure, treatment alternatives, risks and benefits explained, specific risks discussed. Consent was given by the patient. Immediately prior to procedure a time out was called to verify the correct patient, procedure, equipment, support staff and site/side marked as required. Patient was prepped and draped in the usual sterile fashion.    Plan: Discussed with him knee friendly exercises. He'll work on Forensic scientist. Follow-up with [place] in 8 weeks see what type of response he had to the injection today. Patient did tolerate injection well.

## 2020-08-04 ENCOUNTER — Telehealth: Payer: Self-pay

## 2020-08-04 DIAGNOSIS — I422 Other hypertrophic cardiomyopathy: Secondary | ICD-10-CM

## 2020-08-04 DIAGNOSIS — Z79899 Other long term (current) drug therapy: Secondary | ICD-10-CM

## 2020-08-04 DIAGNOSIS — Z01812 Encounter for preprocedural laboratory examination: Secondary | ICD-10-CM

## 2020-08-04 DIAGNOSIS — I428 Other cardiomyopathies: Secondary | ICD-10-CM

## 2020-08-04 DIAGNOSIS — I5022 Chronic systolic (congestive) heart failure: Secondary | ICD-10-CM

## 2020-08-04 NOTE — Telephone Encounter (Signed)
Spoke with pt at Dr Olin Pia request to schedule pt for CRT upgrade of his current PPM.  Pt would like to schedule procedure on 08/25/2020.  Pt advised once procedure is scheduled will contact to review instructions.  Pt also advised consult has been requested with Dr Lattie Corns and he should receive a phone call to schedule that appointment.  Pt verbalizes understanding and agrees with current plan.

## 2020-08-04 NOTE — Addendum Note (Signed)
Addended by: Thora Lance on: 08/04/2020 03:26 PM   Modules accepted: Orders

## 2020-08-11 ENCOUNTER — Ambulatory Visit (INDEPENDENT_AMBULATORY_CARE_PROVIDER_SITE_OTHER): Payer: Medicare Other

## 2020-08-11 DIAGNOSIS — I428 Other cardiomyopathies: Secondary | ICD-10-CM

## 2020-08-11 LAB — CUP PACEART REMOTE DEVICE CHECK
Battery Remaining Longevity: 46 mo
Battery Voltage: 2.98 V
Brady Statistic AP VP Percent: 31.85 %
Brady Statistic AP VS Percent: 58.41 %
Brady Statistic AS VP Percent: 3.34 %
Brady Statistic AS VS Percent: 6.4 %
Brady Statistic RA Percent Paced: 89.52 %
Brady Statistic RV Percent Paced: 35.49 %
Date Time Interrogation Session: 20220223103856
Implantable Lead Implant Date: 20060223
Implantable Lead Implant Date: 20060223
Implantable Lead Location: 753859
Implantable Lead Location: 753860
Implantable Lead Model: 5076
Implantable Lead Model: 5076
Implantable Pulse Generator Implant Date: 20151125
Lead Channel Impedance Value: 342 Ohm
Lead Channel Impedance Value: 380 Ohm
Lead Channel Impedance Value: 456 Ohm
Lead Channel Impedance Value: 475 Ohm
Lead Channel Pacing Threshold Amplitude: 0.625 V
Lead Channel Pacing Threshold Amplitude: 0.75 V
Lead Channel Pacing Threshold Pulse Width: 0.4 ms
Lead Channel Pacing Threshold Pulse Width: 0.4 ms
Lead Channel Sensing Intrinsic Amplitude: 2.25 mV
Lead Channel Sensing Intrinsic Amplitude: 2.25 mV
Lead Channel Sensing Intrinsic Amplitude: 4.375 mV
Lead Channel Sensing Intrinsic Amplitude: 4.375 mV
Lead Channel Setting Pacing Amplitude: 2 V
Lead Channel Setting Pacing Amplitude: 2.5 V
Lead Channel Setting Pacing Pulse Width: 0.4 ms
Lead Channel Setting Sensing Sensitivity: 2.8 mV

## 2020-08-13 ENCOUNTER — Telehealth: Payer: Self-pay

## 2020-08-13 DIAGNOSIS — Z7901 Long term (current) use of anticoagulants: Secondary | ICD-10-CM | POA: Diagnosis not present

## 2020-08-13 NOTE — Telephone Encounter (Signed)
Spoke with pt and reviewed device instructions with pt.  Copy of letter placed at front desk along with surgical scrub.  See letter for complete instructions.  Pt verbalizes understanding and agrees with current plan.

## 2020-08-20 ENCOUNTER — Other Ambulatory Visit: Payer: Self-pay

## 2020-08-20 ENCOUNTER — Other Ambulatory Visit: Payer: Medicare Other | Admitting: *Deleted

## 2020-08-20 DIAGNOSIS — Z79899 Other long term (current) drug therapy: Secondary | ICD-10-CM

## 2020-08-20 DIAGNOSIS — I422 Other hypertrophic cardiomyopathy: Secondary | ICD-10-CM

## 2020-08-20 DIAGNOSIS — I428 Other cardiomyopathies: Secondary | ICD-10-CM | POA: Diagnosis not present

## 2020-08-20 DIAGNOSIS — I5022 Chronic systolic (congestive) heart failure: Secondary | ICD-10-CM | POA: Diagnosis not present

## 2020-08-20 DIAGNOSIS — Z01812 Encounter for preprocedural laboratory examination: Secondary | ICD-10-CM | POA: Diagnosis not present

## 2020-08-20 LAB — CBC
Hematocrit: 45.5 % (ref 37.5–51.0)
Hemoglobin: 15.1 g/dL (ref 13.0–17.7)
MCH: 28.5 pg (ref 26.6–33.0)
MCHC: 33.2 g/dL (ref 31.5–35.7)
MCV: 86 fL (ref 79–97)
Platelets: 126 10*3/uL — ABNORMAL LOW (ref 150–450)
RBC: 5.29 x10E6/uL (ref 4.14–5.80)
RDW: 14.3 % (ref 11.6–15.4)
WBC: 6.9 10*3/uL (ref 3.4–10.8)

## 2020-08-20 LAB — BASIC METABOLIC PANEL
BUN/Creatinine Ratio: 17 (ref 10–24)
BUN: 19 mg/dL (ref 8–27)
CO2: 21 mmol/L (ref 20–29)
Calcium: 9.5 mg/dL (ref 8.6–10.2)
Chloride: 105 mmol/L (ref 96–106)
Creatinine, Ser: 1.13 mg/dL (ref 0.76–1.27)
Glucose: 99 mg/dL (ref 65–99)
Potassium: 4.6 mmol/L (ref 3.5–5.2)
Sodium: 140 mmol/L (ref 134–144)
eGFR: 64 mL/min/{1.73_m2} (ref >59)

## 2020-08-20 LAB — PROTIME-INR
INR: 2 — ABNORMAL HIGH (ref 0.9–1.2)
Prothrombin Time: 20.1 s — ABNORMAL HIGH (ref 9.1–12.0)

## 2020-08-20 NOTE — Progress Notes (Signed)
Remote pacemaker transmission.   

## 2020-08-23 ENCOUNTER — Other Ambulatory Visit (HOSPITAL_COMMUNITY)
Admission: RE | Admit: 2020-08-23 | Discharge: 2020-08-23 | Disposition: A | Discharge status: Home / Self Care | Payer: Medicare Other | Source: Ambulatory Visit | Attending: Internal Medicine | Admitting: Internal Medicine

## 2020-08-23 ENCOUNTER — Telehealth: Payer: Self-pay

## 2020-08-23 ENCOUNTER — Other Ambulatory Visit: Payer: Medicare Other

## 2020-08-23 DIAGNOSIS — Z20822 Contact with and (suspected) exposure to covid-19: Secondary | ICD-10-CM | POA: Diagnosis not present

## 2020-08-23 DIAGNOSIS — Z01812 Encounter for preprocedural laboratory examination: Secondary | ICD-10-CM | POA: Insufficient documentation

## 2020-08-23 LAB — SARS CORONAVIRUS 2 (TAT 6-24 HRS): SARS Coronavirus 2: NEGATIVE

## 2020-08-23 NOTE — Telephone Encounter (Signed)
Spoke with pt and advised per Dr Caryl Comes based on pt's recent PT/INR he may continue on his Coumadin without interruption prior to his device procedure.  Pt verbalizes understanding and agrees with current plan.

## 2020-08-24 ENCOUNTER — Other Ambulatory Visit: Payer: Self-pay | Admitting: Internal Medicine

## 2020-08-24 DIAGNOSIS — Z950 Presence of cardiac pacemaker: Secondary | ICD-10-CM

## 2020-08-24 NOTE — Progress Notes (Signed)
Instructed patient on the following items: Arrival time 1100 Nothing to eat or drink after midnight No meds AM of procedure Responsible person to drive you home and stay with you for 24 hrs Wash with special soap night before and morning of procedure If on anti-coagulant drug instructions Coumadin- don't hold per Dr Caryl Comes

## 2020-08-25 ENCOUNTER — Ambulatory Visit (HOSPITAL_COMMUNITY)
Admission: RE | Disposition: A | Discharge status: Home / Self Care | Payer: Self-pay | Source: Home / Self Care | Attending: Internal Medicine

## 2020-08-25 ENCOUNTER — Other Ambulatory Visit: Payer: Self-pay

## 2020-08-25 ENCOUNTER — Ambulatory Visit (HOSPITAL_COMMUNITY)
Admission: RE | Admit: 2020-08-25 | Discharge: 2020-08-25 | Disposition: A | Discharge status: Home / Self Care | Payer: Medicare Other | Attending: Internal Medicine | Admitting: Internal Medicine

## 2020-08-25 ENCOUNTER — Ambulatory Visit (HOSPITAL_COMMUNITY): Payer: Medicare Other

## 2020-08-25 DIAGNOSIS — I495 Sick sinus syndrome: Secondary | ICD-10-CM | POA: Diagnosis not present

## 2020-08-25 DIAGNOSIS — I110 Hypertensive heart disease with heart failure: Secondary | ICD-10-CM | POA: Diagnosis not present

## 2020-08-25 DIAGNOSIS — I5022 Chronic systolic (congestive) heart failure: Secondary | ICD-10-CM | POA: Insufficient documentation

## 2020-08-25 DIAGNOSIS — I447 Left bundle-branch block, unspecified: Secondary | ICD-10-CM | POA: Diagnosis not present

## 2020-08-25 DIAGNOSIS — Z87891 Personal history of nicotine dependence: Secondary | ICD-10-CM | POA: Diagnosis not present

## 2020-08-25 DIAGNOSIS — F32A Depression, unspecified: Secondary | ICD-10-CM | POA: Insufficient documentation

## 2020-08-25 DIAGNOSIS — Z86711 Personal history of pulmonary embolism: Secondary | ICD-10-CM | POA: Insufficient documentation

## 2020-08-25 DIAGNOSIS — Z959 Presence of cardiac and vascular implant and graft, unspecified: Secondary | ICD-10-CM

## 2020-08-25 DIAGNOSIS — I471 Supraventricular tachycardia: Secondary | ICD-10-CM | POA: Diagnosis not present

## 2020-08-25 DIAGNOSIS — R0609 Other forms of dyspnea: Secondary | ICD-10-CM | POA: Insufficient documentation

## 2020-08-25 DIAGNOSIS — Z45010 Encounter for checking and testing of cardiac pacemaker pulse generator [battery]: Secondary | ICD-10-CM | POA: Diagnosis not present

## 2020-08-25 DIAGNOSIS — I428 Other cardiomyopathies: Secondary | ICD-10-CM | POA: Insufficient documentation

## 2020-08-25 DIAGNOSIS — Z79899 Other long term (current) drug therapy: Secondary | ICD-10-CM | POA: Insufficient documentation

## 2020-08-25 DIAGNOSIS — Z950 Presence of cardiac pacemaker: Secondary | ICD-10-CM

## 2020-08-25 DIAGNOSIS — Z7901 Long term (current) use of anticoagulants: Secondary | ICD-10-CM | POA: Insufficient documentation

## 2020-08-25 DIAGNOSIS — J9811 Atelectasis: Secondary | ICD-10-CM | POA: Diagnosis not present

## 2020-08-25 HISTORY — PX: BIV UPGRADE: EP1202

## 2020-08-25 LAB — PROTIME-INR
INR: 2.7 — ABNORMAL HIGH (ref 0.8–1.2)
Prothrombin Time: 27.8 seconds — ABNORMAL HIGH (ref 11.4–15.2)

## 2020-08-25 LAB — GLUCOSE, CAPILLARY: Glucose-Capillary: 116 mg/dL — ABNORMAL HIGH (ref 70–99)

## 2020-08-25 SURGERY — BIV UPGRADE

## 2020-08-25 MED ORDER — ACETAMINOPHEN 325 MG PO TABS
325.0000 mg | ORAL_TABLET | ORAL | Status: DC | PRN
  Filled 2020-08-25: qty 2

## 2020-08-25 MED ORDER — LIDOCAINE HCL (PF) 1 % IJ SOLN
INTRAMUSCULAR | Status: DC | PRN
  Administered 2020-08-25: 60 mL

## 2020-08-25 MED ORDER — HEPARIN (PORCINE) IN NACL 1000-0.9 UT/500ML-% IV SOLN
INTRAVENOUS | Status: DC | PRN
  Administered 2020-08-25: 500 mL

## 2020-08-25 MED ORDER — MIDAZOLAM HCL 5 MG/5ML IJ SOLN
INTRAMUSCULAR | Status: DC | PRN
  Administered 2020-08-25: 0.5 mg via INTRAVENOUS
  Administered 2020-08-25 (×2): 1 mg via INTRAVENOUS
  Administered 2020-08-25: 0.5 mg via INTRAVENOUS

## 2020-08-25 MED ORDER — IOHEXOL 350 MG/ML SOLN
INTRAVENOUS | Status: DC | PRN
  Administered 2020-08-25: 10 mL
  Administered 2020-08-25: 20 mL

## 2020-08-25 MED ORDER — FENTANYL CITRATE (PF) 100 MCG/2ML IJ SOLN
INTRAMUSCULAR | Status: AC
  Filled 2020-08-25: qty 2

## 2020-08-25 MED ORDER — SODIUM CHLORIDE 0.9 % IV SOLN: INTRAVENOUS | Status: DC

## 2020-08-25 MED ORDER — ONDANSETRON HCL 4 MG/2ML IJ SOLN: 4.0000 mg | Freq: Four times a day (QID) | INTRAMUSCULAR | Status: DC | PRN

## 2020-08-25 MED ORDER — CEFAZOLIN SODIUM-DEXTROSE 2-4 GM/100ML-% IV SOLN
INTRAVENOUS | Status: AC
  Filled 2020-08-25: qty 100

## 2020-08-25 MED ORDER — LIDOCAINE HCL 1 % IJ SOLN
INTRAMUSCULAR | Status: AC
  Filled 2020-08-25: qty 60

## 2020-08-25 MED ORDER — SODIUM CHLORIDE 0.9 % IV SOLN
INTRAVENOUS | Status: AC
  Filled 2020-08-25: qty 2

## 2020-08-25 MED ORDER — SODIUM CHLORIDE 0.9 % IV SOLN
80.0000 mg | INTRAVENOUS | Status: AC
  Administered 2020-08-25: 80 mg

## 2020-08-25 MED ORDER — CEFAZOLIN SODIUM-DEXTROSE 2-4 GM/100ML-% IV SOLN
2.0000 g | INTRAVENOUS | Status: AC
  Administered 2020-08-25: 2 g via INTRAVENOUS

## 2020-08-25 MED ORDER — FENTANYL CITRATE (PF) 100 MCG/2ML IJ SOLN
INTRAMUSCULAR | Status: DC | PRN
  Administered 2020-08-25 (×2): 12.5 ug via INTRAVENOUS
  Administered 2020-08-25: 25 ug via INTRAVENOUS
  Administered 2020-08-25: 12.5 ug via INTRAVENOUS

## 2020-08-25 MED ORDER — HEPARIN (PORCINE) IN NACL 1000-0.9 UT/500ML-% IV SOLN
INTRAVENOUS | Status: AC
  Filled 2020-08-25: qty 500

## 2020-08-25 MED ORDER — SODIUM CHLORIDE 0.9 % IV SOLN
80.0000 mg | INTRAVENOUS | Status: DC
  Filled 2020-08-25: qty 2

## 2020-08-25 MED ORDER — MIDAZOLAM HCL 5 MG/5ML IJ SOLN
INTRAMUSCULAR | Status: AC
  Filled 2020-08-25: qty 5

## 2020-08-25 SURGICAL SUPPLY — 16 items
CABLE SURGICAL S-101-97-12 (CABLE) ×4 IMPLANT
CATH CPS DIRECT RT DS2C023 (CATHETERS) ×2 IMPLANT
DEVICE CRTP PERCEPTA QUAD MRI (Pacemaker) ×2 IMPLANT
DEVICE DISSECT PLASMABLAD 3.0S (MISCELLANEOUS) ×1 IMPLANT
HEMOSTAT SURGICEL 2X4 FIBR (HEMOSTASIS) ×2 IMPLANT
KIT MICROPUNCTURE NIT STIFF (SHEATH) ×2 IMPLANT
LEAD ATTAIN PERFORMA S 4598-88 (Lead) ×2 IMPLANT
PAD PRO RADIOLUCENT 2001M-C (PAD) ×2 IMPLANT
PLASMABLADE 3.0S (MISCELLANEOUS) ×2
POUCH AIGIS-R ANTIBACT ICD (Mesh General) ×2 IMPLANT
SHEATH 9.5FR PRELUDE SNAP 13 (SHEATH) ×2 IMPLANT
SHEATH PINNACLE 4F 10CM (SHEATH) ×2 IMPLANT
SHEATH PINNACLE 6F 10CM (SHEATH) ×2 IMPLANT
TRAY PACEMAKER INSERTION (PACKS) ×2 IMPLANT
WIRE ACUITY WHISPER EDS 4648 (WIRE) ×2 IMPLANT
WIRE HI TORQ VERSACORE-J 145CM (WIRE) ×4 IMPLANT

## 2020-08-25 NOTE — H&P (Signed)
Patient Care Team: Crist Infante, MD as PCP - General   HPI  Steven Macdonald is a 86 y.o. male admitted for CRT upgrade of R pacemaker  Has developed progressive fatigue with minimal edema, denies dyspnea and chest pain   Eval >> worsening LV dysfunction and increased V pacing, albeit in the context of chronic LBBB  DATE TEST EF    10/09 CTA  Ca score 0   12/13 Echo   25 %  3/12 LBBB  1/15 Myoview  Multiple perfusion defects    1/15 Echo   45-50 %  LBBB 152  10/16 Echo  45-50% "WT normal" 16/72mm   11/20 Echo  40-45% LVH severe  21/12 mm LBBB 160   6/21 CTA  4.1cm Aortic root   1/22 Echo  20-25% LVH severe 10/8 mm Discussed with BC>LVH     He has a history of pulmonary embolism and is on Coumadin.     Date Cr K Hgb  10/18   14.5   11/20 0.99 4.0 14.2         Records and Results Reviewed   Past Medical History:  Diagnosis Date  . Arthritis   . Atrial flutter ([place])    RFCA   . BPH (benign prostatic hypertrophy)   . Cardiomyopathy, nonischemic -resolved    Echo 2008: EF 25% Cardiac CT 2009: Normal cors EF 31% Echo 3/10 EF 45% Echo 6/13: EF 50-55%    . CHF (congestive heart failure) ([place])   . Chicken pox   . Colon polyps    hyperplastic  . Diabetes mellitus ([place])   . ED (erectile dysfunction)   . Gilbert's syndrome    possible  . Hemorrhoids   . Herniated cervical disc   . HLD (hyperlipidemia)   . HTN (hypertension)   . LBBB (left bundle branch block)   . Orthostatic hypotension   . Pacemaker  Medtronic    DOI 2/06  . 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 Pulmonary embolism ([place])   . Sinus node dysfunction [facility])         Past Surgical History:  Procedure Laterality Date  . CARDIOVERSION    . coronary ablation    . CYSTOSCOPY W/ TRANSURETHRAL RESECTION OF POSTERIOR URETHERAL VALVES    . PACEMAKER GENERATOR CHANGE N/A 05/13/2014   Procedure: PACEMAKER GENERATOR CHANGE;  Surgeon: Deboraha Sprang, MD;  Location[facility] CATH LAB;  Service:  Cardiovascular;  Laterality: N/A;  . PACEMAKER INSERTION      Current Facility-Administered Medications  Medication Dose Route Frequency Provider Last Rate Last Admin  . 0.9 %  sodium chloride infusion   Intravenous Continuous Deboraha Sprang, MD      . 0.9 %  sodium chloride infusion   Intravenous Continuous Deboraha Sprang, MD      . ceFAZolin (ANCEF) IVPB 2g/100 mL premix  2 g Intravenous On Call Deboraha Sprang, MD      . gentamicin (GARAMYCIN) 80 mg in sodium chloride 0.9 % 500 mL irrigation  80 mg Irrigation On Call Deboraha Sprang, MD        No Known Allergies    Social History   Tobacco Use  . Smoking status: Former Smoker    Quit date: 10/17/1970    Years since quitting: 49.8  . Smokeless tobacco: Never Used  Vaping Use  . Vaping Use: Never used  Substance Use Topics  . Alcohol use: Yes    Comment: 2 glasses  wine daily  . Drug use: No     Family History  Problem Relation Age of Onset  . Breast cancer Mother   . Colon polyps Father        pre-cancerous  . Bladder Cancer Father      Current Meds  Medication Sig  . Calcium Carb-Cholecalciferol (CALCIUM 600+D3) 600-800 MG-UNIT TABS Take 1 tablet by mouth daily.  . cholecalciferol (VITAMIN D) 1000 UNITS tablet Take 1,000 Units by mouth daily.  . Cyanocobalamin (VITAMIN B-12) 2500 MCG SUBL Place 2,500 mcg under the tongue daily.  . finasteride (PROSCAR) 5 MG tablet Take 5 mg by mouth every evening.  . mirtazapine (REMERON) 15 MG tablet Take 15 mg by mouth at bedtime.  . Omega-3 Fatty Acids (FISH OIL) 1200 MG CAPS Take 2,400 mg by mouth daily.  . polyethylene glycol (MIRALAX / GLYCOLAX) 17 g packet Take 17 g by mouth daily as needed (constipation).  Marland Kitchen PRESCRIPTION MEDICATION Apply 1 application topically See admin instructions. Dermatological intment for wound care - apply to tailbone daily as needed for sores  . Probiotic Product (ALIGN PO) Take 4 mg by mouth daily.  . propafenone (RYTHMOL) 225 MG tablet Take 225 mg  by mouth 2 (two) times daily.  . PSYLLIUM PO Take 10 mLs by mouth daily as needed (constipation). Mix in 8 oz liquid and drink  . sildenafil (REVATIO) 20 MG tablet Take 100 mg by mouth daily as needed (erectile dysfunction).  . simvastatin (ZOCOR) 40 MG tablet Take 20 mg by mouth 2 (two) times daily.  . tamsulosin (FLOMAX) 0.4 MG CAPS capsule Take 0.4 mg by mouth every morning.   . warfarin (COUMADIN) 5 MG tablet Take 5-7.5 mg by mouth See admin instructions. Take 1 tablet (5 mg) by mouth on Sundays, Tuesdays, Thursdays & Saturdays. Take 1.5 tablets (7.5 mg) by mouth on Mondays, Wednesdays, & Fridays.     Review of Systems negative except from HPI and PMH  Physical Exam BP (!) 145/82   Pulse 62   Temp (!) 97.5 F (36.4 C)   Ht 6\' 3"  (1.905 m)   Wt 76.2 kg   SpO2 98%   BMI 21.00 kg/m  Well developed and well nourished in no acute distress HENT normal E scleral and icterus clear Neck Supple JVP flat; carotids brisk and full Clear to ausculation Device pocket well healed; without hematoma or erythema.  There is no tethering minimal collateralization noted Regular rate and rhythm, no murmurs gallops or rub Soft with active bowel sounds No clubbing cyanosis  Edema Alert and oriented, grossly normal motor and sensory function Skin Warm and Dry    Assessment and  Plan Sinus node dysfunction  Pacemaker-Medtronic    Dypsnea on exertion  Nonischemic cardiomyopathy-resolved and now recurrent  Hypertrophic heart disease  CHF chronic systolic  LBBB   Pulmonary Embolism  Chronic therapy  Depression  Increasing ventricular pacing 2>>40% (1/21>1/22)  Syncope  2/2 atrial tachycardia >> propafenone    Worsening symptoms of heart failure, DOE, assoc with interval deterioration of LV systolic function with underlying BBB and increased Vpacing, notwithstanding MVP pacing mode.  Discussed the role of resynchronization to try to improve LV function and decrease CHF  symptoms  We have reviewed the benefits and risks of generator replacement.  These include but are not limited to lead fracture and infection.  The patient understands, agrees and is willing to proceed.     Also over the phone had discussed the presence of severe LVH  dating back years, (and variably noted and missed by me) and the potential familial implications and the role for genetic counseling to which he was agreeable

## 2020-08-25 NOTE — Discharge Instructions (Signed)
After Your Lead Revision  . Do not lift your arm above shoulder height for 1 week after your procedure. After 7 days, you may progress as below.  . You should remove your sling 24 hours after your procedure, unless otherwise instructed by your provider.     Wednesday September 01, 2020  Thursday September 02, 2020 Friday September 03, 2020 Saturday September 04, 2020   . Do not lift, push, pull, or carry anything over 10 pounds with the affected arm until 6 weeks (Wednesday October 06, 2020) after your procedure.   . Do not drive until your wound check or until instructed by your healthcare provider that you are safe to do so.   . Monitor your surgical site for redness, swelling, and drainage. Call the device clinic at [phone] if you experience these symptoms or fever/chills.  . If your incision is sealed with Steri-strips or staples. You may shower 7 days after your procedure and wash your incision with soap and water as long as it is healed. If your incision is closed with Dermabond/Surgical glue. You may shower 1 day after your pacemaker implant and wash your incision with soap and water. Avoid lotions, ointments, or perfumes over your incision until it is well-healed.  . You may use a hot tub or a pool AFTER your wound check appointment if the incision is completely closed.   . Your cardiac device may be MRI compatible. We will discuss this at your office visit/Wound check  . Remote monitoring is used to monitor your cardiac device from home. This monitoring is scheduled every 91 days by our office. It allows [place] to keep an eye on the functioning of your device to ensure it is working properly. You will routinely see your Electrophysiologist annually (more often if necessary).   Implantable Cardiac Device Lead Replacement, Care After This sheet gives you information about how to care for yourself after your procedure. Your health care provider may also give you more specific instructions. If you have  problems or questions, contact your health care provider. What can I expect after the procedure? After your procedure, it is common to have:  Mild discomfort at the incision site.  A small amount of drainage or bleeding at the incision site. This is usually no more than a spot. Follow these instructions at home: Incision care         Follow instructions from your health care provider about how to take care of your incision. Make sure you: ? Leave stitches (sutures), skin glue, or adhesive strips in place. These skin closures may need to stay in place for 2 weeks or longer. If adhesive strip edges start to loosen and curl up, you may trim the loose edges. Do not remove adhesive strips completely unless your health care provider tells you to do that.  Check your incision area every day for signs of infection. Check for: ? More redness, swelling, or pain. ? More fluid or blood. ? Warmth. ? Pus or a bad smell. Electric and Quarry [employer] cell phones should be kept 12 inches (30 cm) away from the cardiac device when they are on. When talking on a cell phone, use the ear on the opposite side of your cardiac device.  Do not place a cell phone in a pocket next to the cardiac device.  Household appliances do not interfere with modern-day cardiac device. Medicines  Take over-the-counter and prescription medicines only as told by your health care provider. General  instructions  Do not raise the arm on the side of your procedure higher than your shoulder for at least 7 days. Except for this restriction, continue to use your arm as normal to prevent problems.  Do not take baths, swim, or use a hot tub until your health care provider says it is okay to do so. You may shower as directed by your health care provider.  Do not lift anything that is heavier than 10 lb (4.5 kg) for 6 weeks or the limit that your health care provider tells you, until he or she says that it is  safe.  Return to your normal activities after 2 weeks, or as told by your health care provider. Ask your health care provider what activities are safe for you.  Keep all follow-up visits as told by your health care provider. This is important. Contact a health care provider if:  You have more redness, swelling, or pain around your incision.  You have more fluid or blood coming from your incision.  Your incision feels warm to the touch.  You have pus or a bad smell coming from your incision.  You have a fever.  The arm or hand on the side of the cardiac device becomes swollen.  The symptoms you had before your procedure are not getting better. Get help right away if:  You develop chest pain.  You feel like you will faint.  You feel light-headed.  You faint. Summary  Check your incision area every day for signs of infection, such as more fluid or blood. A small amount of drainage or bleeding at the incision site is normal.  Do not raise the arm on the side of your procedure higher than your shoulder for at least 5 days, or as long as directed by your health care provider.  Digital cell phones should be kept 12 inches (30 cm) away from the cardiac device when they are on. When talking on a cell phone, use the ear on the opposite side of your cardiac device.  If the symptoms that led to having your lead replaced are not getting better, contact your health care provider. This information is not intended to replace advice given to you by your health care provider. Make sure you discuss any questions you have with your health care provider.

## 2020-08-25 NOTE — Progress Notes (Addendum)
Discharge instructions reviewed with pt and his wife. Both voice understanding. Dr Caryl Comes called gave ok for pt to be discharge home. Informed pt and his wife to remove pressure dressing Friday. Voice understanding. Pt has ambulated to the bathroom and in the hallway. Tol well

## 2020-08-26 ENCOUNTER — Telehealth: Payer: Self-pay | Admitting: Emergency Medicine

## 2020-08-26 ENCOUNTER — Encounter (HOSPITAL_COMMUNITY): Payer: Self-pay | Admitting: Internal Medicine

## 2020-08-26 MED FILL — Lidocaine HCl Local Inj 1%: INTRAMUSCULAR | Qty: 60 | Status: AC

## 2020-08-26 NOTE — Telephone Encounter (Signed)
Follow-up after same day discharge: Implant date: 08/25/2020 MD: Virl Axe, MD Device: Medtronic Advisa Location: Left Chest   Wound check visit: 09/02/20 @ 10:40 90 day MD follow-up: 08/30/20 @ 3:30  Remote Transmission received:No ([place] is not a home right now, states he will send tomorrow night. Education provided.   Dressing removed: No, states they will remove it tonight. Aware to call with any swelling, redness, drainage, or other symptoms.

## 2020-08-26 NOTE — Telephone Encounter (Signed)
-----   Message from Shirley Friar, PA-C sent at 08/26/2020  7:08 AM EST ----- SAme day DD BiV upgrade Dr Caryl Comes 08/25/20

## 2020-08-27 ENCOUNTER — Other Ambulatory Visit: Payer: Self-pay

## 2020-08-27 NOTE — Progress Notes (Signed)
Opening error 

## 2020-08-30 ENCOUNTER — Encounter (HOSPITAL_COMMUNITY): Payer: Self-pay | Admitting: Internal Medicine

## 2020-08-30 NOTE — Telephone Encounter (Signed)
Contacted patient about elevated RV threshold. Patient has an appointment on Thursday 09/02/2020 at 10:40 am [facility] will check device during appointment.

## 2020-09-02 ENCOUNTER — Other Ambulatory Visit: Payer: Self-pay

## 2020-09-02 ENCOUNTER — Ambulatory Visit (INDEPENDENT_AMBULATORY_CARE_PROVIDER_SITE_OTHER): Payer: Medicare Other | Admitting: Emergency Medicine

## 2020-09-02 DIAGNOSIS — R55 Syncope and collapse: Secondary | ICD-10-CM

## 2020-09-02 DIAGNOSIS — I428 Other cardiomyopathies: Secondary | ICD-10-CM

## 2020-09-02 DIAGNOSIS — I495 Sick sinus syndrome: Secondary | ICD-10-CM

## 2020-09-02 LAB — CUP PACEART INCLINIC DEVICE CHECK
Battery Remaining Longevity: 65 mo
Battery Voltage: 3.15 V
Brady Statistic AP VP Percent: 77.71 %
Brady Statistic AP VS Percent: 0.16 %
Brady Statistic AS VP Percent: 16.89 %
Brady Statistic AS VS Percent: 5.25 %
Brady Statistic RA Percent Paced: 81.31 %
Brady Statistic RV Percent Paced: 94.59 %
Date Time Interrogation Session: 20220317111633
Implantable Lead Implant Date: 20060223
Implantable Lead Implant Date: 20060223
Implantable Lead Implant Date: 20220309
Implantable Lead Location: 753858
Implantable Lead Location: 753859
Implantable Lead Location: 753860
Implantable Lead Model: 4598
Implantable Lead Model: 5076
Implantable Lead Model: 5076
Implantable Pulse Generator Implant Date: 20220309
Lead Channel Impedance Value: 342 Ohm
Lead Channel Impedance Value: 361 Ohm
Lead Channel Impedance Value: 380 Ohm
Lead Channel Impedance Value: 437 Ohm
Lead Channel Impedance Value: 494 Ohm
Lead Channel Impedance Value: 513 Ohm
Lead Channel Impedance Value: 532 Ohm
Lead Channel Impedance Value: 551 Ohm
Lead Channel Impedance Value: 570 Ohm
Lead Channel Impedance Value: 741 Ohm
Lead Channel Impedance Value: 779 Ohm
Lead Channel Impedance Value: 817 Ohm
Lead Channel Impedance Value: 836 Ohm
Lead Channel Impedance Value: 969 Ohm
Lead Channel Pacing Threshold Amplitude: 0.5 V
Lead Channel Pacing Threshold Amplitude: 1 V
Lead Channel Pacing Threshold Amplitude: 4.75 V
Lead Channel Pacing Threshold Pulse Width: 0.4 ms
Lead Channel Pacing Threshold Pulse Width: 0.4 ms
Lead Channel Pacing Threshold Pulse Width: 0.4 ms
Lead Channel Sensing Intrinsic Amplitude: 3.75 mV
Lead Channel Setting Pacing Amplitude: 1.5 V
Lead Channel Setting Pacing Amplitude: 2.5 V
Lead Channel Setting Pacing Amplitude: 6 V
Lead Channel Setting Pacing Pulse Width: 0.4 ms
Lead Channel Setting Pacing Pulse Width: 0.4 ms
Lead Channel Setting Sensing Sensitivity: 1.2 mV

## 2020-09-02 NOTE — Progress Notes (Signed)
Wound check appointment. Dermabond removed. Wound without redness or edema, minimal bruising noted. Incision edges approximated, wound well healed. Normal device function. Thresholds,  R wave sensing, and impedances consistent with implant measurements. Device programmed RA/RV appropriately for chronic leads, LV lead programmed at 6.0 @ 0.33ms for extra safety margin until 3 month visit. Histogram distribution appropriate for patient and level of activity. No mode switches or high ventricular rates noted. Patient is BV pacing at 100% (effective 89.6%). Patient educated about wound care, arm mobility, lifting restrictions. ROV in 3 months with Dr. Caryl Comes 11/30/20. Patient is enrolled in remote monitoring with next scheduled remote 11/25/20.

## 2020-09-13 ENCOUNTER — Ambulatory Visit: Payer: Medicare Other | Admitting: Physician Assistant

## 2020-09-21 ENCOUNTER — Ambulatory Visit: Payer: Medicare Other | Admitting: Genetic Counselor

## 2020-09-21 ENCOUNTER — Other Ambulatory Visit: Payer: Self-pay

## 2020-09-30 ENCOUNTER — Encounter: Payer: Medicare Other | Admitting: Genetic Counselor

## 2020-09-30 ENCOUNTER — Telehealth: Payer: Self-pay | Admitting: Emergency Medicine

## 2020-09-30 NOTE — Telephone Encounter (Signed)
Carelink alert received for CRT pacing at 75.2 percent on 09/30/2020. LV lead programmed to 6 volts at last office check on 09/02/20 until 3 month visit. Pacing percentages previously showing effective pacing at 89.6 percent. Sending information to Dr. Caryl Comes for recommendations.

## 2020-10-01 NOTE — Progress Notes (Signed)
Pre Test GC  Referring Provider: Virl Axe, MD  Referral Reason  Steven Macdonald was referred for genetic consult and testing of hypertrophic cardiomyopathy subsequent to a recent cardiac imaging that detected severe LVH of basal septal thickening.  Steven Macdonald (III.1 on pedigree) is a pleasant 86 year-old Caucasian male who reports being diagnosed with CHF in 2008. He reports having progressively worsening fatigue over the last 2 years. He has occasional dizziness and tells me of having two episodes of syncope in 2006 and 2007. In 2006 he had a pacemaker and had two replacements since then. A recent echocardiogram detected severe LVH of basal septum and an EF of 20%-25%.   Traditional Risk Factors [place] reports being diagnosed with HTN about 20 years ago and states that it is well-controlled with medication.   Family history Steven Macdonald (III.1) is the only child of his parents.  He has 4 children, a son age 1 (IV.2) and three daughters, ages, 63, 56 and 42 (IV.1, IV.3-IV.4). His children and grandchildren (V.1-V.7) are in good health but have not been screened by echocardiogram and EKG. There is no history of heart disease amongst his parents (II.8, II.9) or their siblings (II.1-7, II.10-11) and parents (I.1-I.4).  Genetics Steven Macdonald was counseled on the genetics of hypertrophic cardiomyopathy (HCM). I explained to him that this is an autosomal dominant condition and hence his children have a 50% chance of inheriting HCM. I explained to him that his first degree-relatives, namely his son and daughters seek regular surveillance for HCM.  Clinical screening involves echocardiogram and EKG at regular intervals, frequency is typically determined by age, with those over the age of 35 getting screened every 3- 5 years and, children under the age of 31 are screened annually. He verbalized understanding of this.   I discussed penetrance of HCM being incomplete i.e. not all  individuals harboring a HCM mutation will present clinically with HCM, and age-related penetrance where clinical presentation of HCM increases with advanced age. Additionally, there is no significant history of cardiomyopathy or sudden death in his first-degree relatives. At this time it is not clear if he has a de novo mutation for HCM or if there is incomplete penetrance for HCM in his family. I also reviewed variable expression of HCM and emphasized that this condition can express at any age at level of severity in the family. Hence, it is important for his first-degree relatives to stay vigilant and seek regular surveillance for HCM. He verbalized understanding of this. I informed him that some patients - about 8-10% can have compound and digenic mutations for HCM. Also briefly discussed the inheritance pattern and treatment /management plans for the infiltrative cardiomyopathies that present as HCM phenocopies.   We walked through the process of genetic testing. I explained to him that genetic testing is a probabilistic test dependent upon age and severity of presentation, presence of risk factors for HCM and importantly family history of HCM or sudden death in first-degree relatives. He verbalized understanding of this.  The potential outcomes of genetic testing and subsequent management of at-risk family members were discussed so as to manage expectations-  I explained to him that if a mutation is not identified, then all first-degree relatives should regular screening for HCM. I emphasized that even if the genetic test is negative, it does not mean that he does not have HCM. A negative test result can be due to limitations of the genetic test.   There is also the likelihood of  identifying a "Variant of unknown significance". This result means that the variant has not been detected in a statistically significant number of HCM patients and/or functional studies have not been performed to verify its  pathogenicity. This VUS can be tested in the family to see if it segregates with disease. If a VUS is found, first-degree relatives should undergo regular clinical screening for HCM.  If a pathogenic variant is reported, then his first-degree family members can get tested for this variant. If they test positive, it is likely they will develop HCM. In light of variable expression and incomplete penetrance associated with HCM, it is not possible to predict when they will manifest clinically with HCM. It is recommended that family members that test positive for the familial pathogenic variant pursue clinical screening for HCM. Family members that test negative for the familial mutation need not pursue periodic screening for HCM, but seek care if the symptoms develop.   Impression and Plan  Steven Macdonald was diagnosed with HCM at age 13. There is no significant family history of HCM or sudden death amongst his first-degree relatives. His late age of presentation in the absence of a family history of HCM is associated with very low yield of detecting a mutation. Additionally, his medical insurance considers genetic testing for HCM investigational and will not cover this test. It is recommended that his children undergo regular screening for HCM. If they are found to have cardiac wall thickness indicative of HCM, then they can pursue genetic testing. He verbalized understanding of this.  In addition, we discussed the protections afforded by the Genetic Information Non-Discrimination Act (GINA). I explained to him that GINA protects him from losing his employment or health insurance based on his genotype. However, these protections do not cover life insurance and disability. He verbalized understanding of this and states that his children may have life insurance.  Please note that the patient has not been counseled in this visit on personal, cultural or ethical issues that he may face due to his heart condition.     Steven Macdonald, Ph.D, [facility] Clinical Molecular Geneticist

## 2020-10-01 NOTE — Telephone Encounter (Signed)
         Spoke with patient and to let him know that someone from scheduling would be contacting him for an appointment with Dr. Caryl Comes or Jonni Sanger for CRT-P optimization. Patient states that this would be fine.

## 2020-10-04 NOTE — Progress Notes (Addendum)
Cardiology Office Note Date:  10/06/2020  Patient ID:  Steven Macdonald, Steven Macdonald 1934/06/29, MRN [id number] PCP:  Crist Infante, MD  Electrophysiologist: Dr. Caryl Comes    Chief Complaint:  reduction in LVP%, increased thresholds  History of Present Illness: Steven Macdonald is a 86 y.o. male with history of AFlutter (ablated), DM, HTN, HLD, LBBB, PE (on warfarin), sinus node dysfunction w/PPM, ATach, CAD (coronary CT noted occluded LAD, despite low Ca++ score).  He comes in today to be seen for Dr. Caryl Comes, his last office visit Dec 2021, with reduction in stamina and energy, planned to check an echo. LVEF markedly reduced to 20-25%, (from 40-45%) severe LVH given generally hypotensive his whole life felt c/w HCM, LBBB and progressive conduction system disease (noted by increased RV pacing%) recommended he undergo CRT upgrade  He underwent the procedure 08/25/20.  LV thresholds at implant 2.0/0.61ms At his wound check visit LV thresholds 4.5/0.4 and programmed to 6.0V  A remote alert received for reduced effective BV pacing   He is accompanied by his wife. They were hoping to have had a quick turn around in how he is feeling, level of energy, he has not noticed any changes. No CP, no SOB No dizzy spells or syncope.  PMD manages his INRs and lipids, he mentions his cholesterol is always good    Device information MDT dual chamber PPM implanted 2006, gen change, 05/13/2014 >>> CRT-P upgrade 08/25/2020.   Past Medical History:  Diagnosis Date  . Arthritis   . Atrial flutter ([place])    RFCA   . BPH (benign prostatic hypertrophy)   . Cardiomyopathy, nonischemic -resolved    Echo 2008: EF 25% Cardiac CT 2009: Normal cors EF 31% Echo 3/10 EF 45% Echo 6/13: EF 50-55%    . CHF (congestive heart failure) ([place])   . Chicken pox   . Colon polyps    hyperplastic  . Diabetes mellitus ([place])   . ED (erectile dysfunction)   . Gilbert's syndrome    possible  . Hemorrhoids   . Herniated cervical disc    . HLD (hyperlipidemia)   . HTN (hypertension)   . LBBB (left bundle branch block)   . Orthostatic hypotension   . Pacemaker  Medtronic    DOI 2/06  . 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 Pulmonary embolism ([place])   . Sinus node dysfunction [facility])         Past Surgical History:  Procedure Laterality Date  . BIV UPGRADE N/A 08/25/2020   Procedure: BIV PPM UPGRADE;  Surgeon: Deboraha Sprang, MD;  Location: [facility];  Service: Cardiovascular;  Laterality: N/A;  . CARDIOVERSION    . coronary ablation    . CYSTOSCOPY W/ TRANSURETHRAL RESECTION OF POSTERIOR URETHERAL VALVES    . PACEMAKER GENERATOR CHANGE N/A 05/13/2014   Procedure: PACEMAKER GENERATOR CHANGE;  Surgeon: Deboraha Sprang, MD;  [facility];  Service: Cardiovascular;  Laterality: N/A;  . PACEMAKER INSERTION      Current Outpatient Medications  Medication Sig Dispense Refill  . Calcium Carb-Cholecalciferol (CALCIUM 600+D3) 600-800 MG-UNIT TABS Take 1 tablet by mouth daily.    . cholecalciferol (VITAMIN D) 1000 UNITS tablet Take 1,000 Units by mouth daily.    . Cyanocobalamin (VITAMIN B-12) 2500 MCG SUBL Place 2,500 mcg under the tongue daily.    . finasteride (PROSCAR) 5 MG tablet Take 5 mg by mouth every evening.    . mirtazapine (REMERON) 15 MG tablet Take 15 mg by  mouth at bedtime.    . Omega-3 Fatty Acids (FISH OIL) 1200 MG CAPS Take 2,400 mg by mouth daily.    . polyethylene glycol (MIRALAX / GLYCOLAX) 17 g packet Take 17 g by mouth daily as needed (constipation).    Marland Kitchen PRESCRIPTION MEDICATION Apply 1 application topically See admin instructions. Dermatological intment for wound care - apply to tailbone daily as needed for sores    . Probiotic Product (ALIGN PO) Take 4 mg by mouth daily.    . propafenone (RYTHMOL) 225 MG tablet Take 225 mg by mouth 2 (two) times daily.    . PSYLLIUM PO Take 10 mLs by mouth daily as needed (constipation). Mix in 8 oz liquid and drink    . sildenafil (REVATIO) 20 MG tablet  Take 100 mg by mouth daily as needed (erectile dysfunction).    . simvastatin (ZOCOR) 40 MG tablet Take 20 mg by mouth 2 (two) times daily.    . tamsulosin (FLOMAX) 0.4 MG CAPS capsule Take 0.4 mg by mouth every morning.     . warfarin (COUMADIN) 5 MG tablet Take 5-7.5 mg by mouth See admin instructions. Take 1 tablet (5 mg) by mouth on Sundays, Tuesdays, Thursdays & Saturdays. Take 1.5 tablets (7.5 mg) by mouth on Mondays, Wednesdays, & Fridays.     No current facility-administered medications for this visit.    Allergies:   Patient has no known allergies.   Social History:  The patient  reports that he quit smoking about 50 years ago. He has never used smokeless tobacco. He reports current alcohol use. He reports that he does not use drugs.   Family History:  The patient's family history includes Bladder Cancer in his father; Breast cancer in his mother; Colon polyps in his father.   ROS:  Please see the history of present illness.    All other systems are reviewed and otherwise negative.   PHYSICAL EXAM:  VS:  BP 110/70   Pulse 60   Ht 6\' 3"  (1.905 m)   Wt 174 lb 3.2 oz (79 kg)   SpO2 96%   BMI 21.77 kg/m  BMI: Body mass index is 21.77 kg/m. Well nourished, well developed, in no acute distress HEENT: normocephalic, atraumatic Neck: no JVD, carotid bruits or masses Cardiac:  RRR; no significant murmurs, no rubs, or gallops Lungs:  CTA b/l, no wheezing, rhonchi or rales Abd: soft, nontender MS: no deformity or atrophy Ext: no edema Skin: warm and dry, no rash Neuro:  No gross deficits appreciated Psych: euthymic mood, full affect  PPM site is well healed, there is a tiny scab at the very lateral edge, I do not see or feel a stitch, no erythema, edema or fluctuation, site looks stable, no signs of infection, no discomfort   EKG:  Done today and reviewed by myself shows  Pre-implant EKGS are reviewed, LBBB, QRS 206 and 291ms  TODAY Initial programming AV paced + V1 also +  lead one Final with programming changes AV paced + V1 and neg lead one  Device interrogation done today and reviewed by myself:  Industry support Battery and RA/RV lead measurements are stable LV initial vector is LV 3-2 hreshold was 3.75/0.4 Vector express notes best threshold LV1-3 Multiple different timing and LV vs RV 1st tried. Left LV1-3 simultaneous LV lead outputs reduced to 2.5V/0.45ms leaving a 1V margin LV lead from adaptive > monitor  He has had no AF  07/09/2020: TTE IMPRESSIONS  1. Left ventricular ejection fraction, by  estimation, is 20 to 25%. The  left ventricle has severely decreased function. The left ventricle  demonstrates global hypokinesis. The left ventricular internal cavity size  was mildly dilated. There is severe  left ventricular hypertrophy of the basal-septal segment. Left ventricular  diastolic parameters are consistent with Grade I diastolic dysfunction  (impaired relaxation).  2. Right ventricular systolic function is normal. The right ventricular  size is moderately enlarged.  3. The mitral valve is normal in structure. Mild mitral valve  regurgitation. No evidence of mitral stenosis.  4. The aortic valve is tricuspid. Aortic valve regurgitation is mild.  Mild aortic valve sclerosis is present, with no evidence of aortic valve  stenosis.  5. Aortic dilatation noted. There is borderline dilatation of the aortic  root, measuring 39 mm. There is mild dilatation of the ascending aorta,  measuring 43 mm.  Comparison(s): EF 40-45%, severly increased LVH, mid and apical anterior  wall, mid anteroseptal segemwnt, apex and basal anteroseptal segemnt all  abnormal. Trivial AI w/ mild aortic sclerosis. Ascending aorta 40 mm. Hx  sinus node dysfunction.    12/02/2019: CT angio chest IMPRESSION: Stable dilatation of the ascending aorta to 4.1 cm. Recommend annual imaging followup by CTA or MRA. This recommendation follows  2010 ACCF/AHA/AATS/ACR/ASA/SCA/SCAI/SIR/STS/SVM Guidelines for the Diagnosis and Management of Patients with Thoracic Aortic Disease. Circulation. 2010; 121: X412-I786. Aortic aneurysm NOS (ICD10-I71.9)  Old left rib fractures with some associated lower lobe scarring on the left. Previously seen pneumothoraces have resolved.  No acute abnormality is noted.  Aortic Atherosclerosis (ICD10-I70.0).  Aortic aneurysm NOS (ICD10-I71.9).    10/24/2017: Coronary CT IMPRESSION: 1. Coronary artery calcium score 20 Agatston units. This places the patient in the 8th percentile for age/gender, suggesting low risk for future cardiac events.  2. The proximal LAD appears subtotally occluded. Will confirm by FFR.  FINDINGS: FFR 0.89 distal RCA FFR 0.86 distal LCx  The proximal LAD appears occluded. There is collateral filling distally.  IMPRESSION: Occluded proximal LAD.  Rad overread IMPRESSION: 1.  Aortic Atherosclerosis (ICD10-I70.0). 2. Ectasia of ascending thoracic aorta (4.0 cm in diameter). Recommend annual imaging followup by CTA or MRA. This recommendation follows 2010 ACCF/AHA/AATS/ACR/ASA/SCA/SCAI/SIR/STS/SVM Guidelines for the Diagnosis and Management of Patients with Thoracic Aortic Disease. Circulation. 2010; 121: V672-C947.   05/12/2019 TTE IMPRESSIONS  1. Left ventricular ejection fraction, by visual estimation, is 40 to  45%. The left ventricle has mild to moderately decreased function. Left  ventricular septal wall thickness was severely increased. There is  severely increased left ventricular  hypertrophy.  2. Mid and apical anterior wall, mid anteroseptal segment, apex, and  basal anteroseptal segment are abnormal.  3. Abnormal septal motion consistent with RV pacemaker.  4. The left ventricle demonstrates regional wall motion abnormalities.  5. Global right ventricle has moderately reduced systolic function.The  right ventricular size is normal.  No increase in right ventricular wall  thickness.  6. Left atrial size was normal.  7. Right atrial size was normal.  8. The mitral valve is grossly normal. Trace mitral valve regurgitation.  9. The tricuspid valve is normal in structure. Tricuspid valve  regurgitation is mild.  10. The aortic valve is tricuspid. Aortic valve regurgitation is trivial.  Mild aortic valve sclerosis without stenosis.  11. There is Mild calcification of the aortic valve.  12. There is Mild thickening of the aortic valve.  13. The pulmonic valve was grossly normal. Pulmonic valve regurgitation is  not visualized.  14. Aortic dilatation noted.  15. There  is mild dilatation of the ascending aorta measuring 40 mm.  16. A pacer wire is visualized in the RA and RV.  17. EF decreased compared to prior. There is mild global hypokinesis and  abnormal septal motion due to RV pacings, but the anterior and  anteroseptal walls as noted are hypokinetic/akinetic to the apex. LV  thrombus excluded by echo contrast.   09/12/2017: LVEF 50-55%, mod LVH   Recent Labs: 08/20/2020: BUN 19; Creatinine, Ser 1.13; Hemoglobin 15.1; Platelets 126; Potassium 4.6; Sodium 140  No results found for requested labs within last 8760 hours.   CrCl cannot be calculated (Patient's most recent lab result is older than the maximum 21 days allowed.).   Wt Readings from Last 3 Encounters:  10/06/20 174 lb 3.2 oz (79 kg)  08/25/20 168 lb (76.2 kg)  06/09/20 174 lb 12.8 oz (79.3 kg)     Other studies reviewed: Additional studies/records reviewed today include: summarized above  ASSESSMENT AND PLAN:  1. CRT-P      Optimized as above This left his QRS 115ms + V1 and neg lead one This is wider then initial though he was only getting 75% effective BV pacing Current programming has better ECG morphology and remains 70ms shorter the pre-upgrade and much better thresholds  Will send him for CXR to evaluate LV lead position   2. CAD      Occluded LAD by CT 2019     No anginal complaints     Not on ASA w/warfarin, on statin     As below, no BB with tendency towards hypotension  3. New CM     In review of Dr. Olin Pia note not felt to be ischemic     Known LAD occlusion 2019, LVEF 2020 40-45%     Now 20-25% and perhaps 2/2 RV pacing +/- HCM      In review of Dr. Olin Pia notes/thughts, no plans for medication changes, mentions tendency/historically hypotensive. I will not add BB or ARB at this juncture given this.  He sees Dr. Caryl Comes in early July  Disposition: F/u with as scheduled  Current medicines are reviewed at length with the patient today.  The patient did not have any concerns regarding medicines.  Venetia Night, PA-C 10/06/2020 12:54 PM     [place] [place] [address] [phone] (office)  [phone] (fax)

## 2020-10-06 ENCOUNTER — Other Ambulatory Visit: Payer: Self-pay

## 2020-10-06 ENCOUNTER — Encounter: Payer: Self-pay | Admitting: Physician Assistant

## 2020-10-06 ENCOUNTER — Ambulatory Visit
Admission: RE | Admit: 2020-10-06 | Discharge: 2020-10-06 | Disposition: A | Discharge status: Home / Self Care | Payer: Medicare Other | Source: Ambulatory Visit | Attending: Physician Assistant | Admitting: Physician Assistant

## 2020-10-06 ENCOUNTER — Ambulatory Visit (INDEPENDENT_AMBULATORY_CARE_PROVIDER_SITE_OTHER): Payer: Medicare Other | Admitting: Physician Assistant

## 2020-10-06 VITALS — BP 110/70 | HR 60 | Ht 75.0 in | Wt 174.2 lb

## 2020-10-06 DIAGNOSIS — Z950 Presence of cardiac pacemaker: Secondary | ICD-10-CM

## 2020-10-06 DIAGNOSIS — R350 Frequency of micturition: Secondary | ICD-10-CM | POA: Diagnosis not present

## 2020-10-06 DIAGNOSIS — I422 Other hypertrophic cardiomyopathy: Secondary | ICD-10-CM

## 2020-10-06 DIAGNOSIS — I2510 Atherosclerotic heart disease of native coronary artery without angina pectoris: Secondary | ICD-10-CM | POA: Diagnosis not present

## 2020-10-06 DIAGNOSIS — N401 Enlarged prostate with lower urinary tract symptoms: Secondary | ICD-10-CM | POA: Diagnosis not present

## 2020-10-06 DIAGNOSIS — Z45018 Encounter for adjustment and management of other part of cardiac pacemaker: Secondary | ICD-10-CM | POA: Diagnosis not present

## 2020-10-06 NOTE — Patient Instructions (Addendum)
  Medication Instructions:   Your physician recommends that you continue on your current medications as directed. Please refer to the Current Medication list given to you today.  *If you need a refill on your cardiac medications before your next appointment, please call your pharmacy*   Lab Work:  [place]   If you have labs (blood work) drawn today and your tests are completely normal, you will receive your results only by: Marland Kitchen MyChart Message (if you have MyChart) OR . A paper copy in the mail If you have any lab test that is abnormal or we need to change your treatment, we will call you to review the results.   Testing/Procedures: A chest x-ray takes a picture of the organs and structures inside the chest, including the heart, lungs, and blood vessels. This test can show several things, including, whether the heart is enlarges; whether fluid is building up in the lungs; and whether pacemaker / defibrillator leads are still in place. Located in: [facility] Address: [address], [place], [place] [address] Areas served: [place] and nearby areas Hours:  Tuesday 8AM-5:30PM Wednesday 8AM-5:30PM Thursday 8AM-5:30PM Friday 8AM-5:30PM Saturday Closed Sunday Closed Monday 8AM-5:30PM Phone: [phone]    Follow-Up: At [facility], you and your health needs are our priority.  As part of our continuing mission to provide you with exceptional heart care, we have created designated Provider Care Teams.  These Care Teams include your primary Cardiologist (physician) and Advanced Practice Providers (APPs -  Physician Assistants and Nurse Practitioners) who all work together to provide you with the care you need, when you need it.  We recommend signing up for the patient portal called "MyChart".  Sign up information is provided on this After Visit Summary.  MyChart is used to connect with patients for Virtual Visits (Telemedicine).  Patients are able to  view lab/test results, encounter notes, upcoming appointments, etc.  Non-urgent messages can be sent to your provider as well.   To learn more about what you can do with MyChart, go to [email or web address].    Your next appointment:   AS SCHEDULED     Other Instructions

## 2020-10-12 DIAGNOSIS — M7021 Olecranon bursitis, right elbow: Secondary | ICD-10-CM | POA: Diagnosis not present

## 2020-10-14 ENCOUNTER — Ambulatory Visit (INDEPENDENT_AMBULATORY_CARE_PROVIDER_SITE_OTHER): Payer: Medicare Other | Admitting: Physician Assistant

## 2020-10-14 ENCOUNTER — Encounter: Payer: Self-pay | Admitting: Physician Assistant

## 2020-10-14 DIAGNOSIS — M7021 Olecranon bursitis, right elbow: Secondary | ICD-10-CM

## 2020-10-14 DIAGNOSIS — I2510 Atherosclerotic heart disease of native coronary artery without angina pectoris: Secondary | ICD-10-CM

## 2020-10-14 MED ORDER — LIDOCAINE HCL 1 % IJ SOLN
3.0000 mL | INTRAMUSCULAR | Status: AC | PRN
  Administered 2020-10-14: 3 mL

## 2020-10-14 MED ORDER — METHYLPREDNISOLONE ACETATE 40 MG/ML IJ SUSP
40.0000 mg | INTRAMUSCULAR | Status: AC | PRN
  Administered 2020-10-14: 40 mg via INTRA_ARTICULAR

## 2020-10-14 NOTE — Progress Notes (Signed)
   Procedure Note  Patient: Steven Macdonald             Date of Birth: August 13, 1934           MRN: [id number]             Visit Date: 10/14/2020  HPI: Mr. Steiner comes in today with right elbow swelling redness for 3 weeks no known injury.  His primary care physician put him on doxycycline 10-day supply he is on day 2.  He is unable to take NSAIDs due to the fact he is on Plavix.  He denies any fevers or chills.  He is nondiabetic  Review of systems see HPI otherwise negative  Physical exam: General well-developed well-nourished male in no acute distress mood and affect appropriate.  Psych alert and oriented x3 Right elbow: Obvious olecranon bursitis.  No active drainage.  Slight erythema significant edema present.  He has good range of motion of the right elbow.  Procedures: Visit Diagnoses:  1. Olecranon bursitis, right elbow     Medium Joint Inj: R olecranon bursa on 10/14/2020 5:22 PM Medications: 3 mL lidocaine 1 %; 40 mg methylPREDNISolone acetate 40 MG/ML Aspirate: 38 mL yellow Consent was given by the patient. Immediately prior to procedure a time out was called to verify the correct patient, procedure, equipment, support staff and site/side marked as required. Patient was prepped and draped in the usual sterile fashion.     Plan: After aspiration injection of the elbow the elbow was wrapped with an Ace bandage and leave this on until tomorrow morning and then remove it.  He will try to avoid pressure on the olecranon area and this was discussed with the patient.  We will see him back in 1 week sooner if there is any questions concerns or concerns of infection.  He will finish out the doxycycline that he was placed on.  Questions were encouraged and answered at length.

## 2020-10-20 ENCOUNTER — Ambulatory Visit (INDEPENDENT_AMBULATORY_CARE_PROVIDER_SITE_OTHER): Payer: Medicare Other | Admitting: Orthopaedic Surgery

## 2020-10-20 ENCOUNTER — Encounter: Payer: Self-pay | Admitting: Orthopaedic Surgery

## 2020-10-20 DIAGNOSIS — I2510 Atherosclerotic heart disease of native coronary artery without angina pectoris: Secondary | ICD-10-CM

## 2020-10-20 DIAGNOSIS — M7021 Olecranon bursitis, right elbow: Secondary | ICD-10-CM

## 2020-10-20 NOTE — Progress Notes (Signed)
The patient is here today with recurrent left elbow olecranon bursitis.  He had an aspiration and a steroid injection about a week ago.  He is 86 years old.  He has had a recurrence of fluid buildup on the right elbow.  Examination of his elbow on the right elbow does show a fluid collection again.  There is no redness or evidence of infection.  I did use a number 18-gauge needle and after cleaning the elbow with alcohol withdrew another 25 cc of fluid off of this area and then placed a compressive dressing which she will remove in 1 hour.  He will likely have a recurrence again.  We will see him back in 2 weeks for another aspiration if needed.  I told him that it is possible to have multiple recurrences of his bursitis then eventually over the next course.  All questions and concerns were answered and addressed.

## 2020-10-22 DIAGNOSIS — Z7901 Long term (current) use of anticoagulants: Secondary | ICD-10-CM | POA: Diagnosis not present

## 2020-10-22 DIAGNOSIS — E291 Testicular hypofunction: Secondary | ICD-10-CM | POA: Diagnosis not present

## 2020-10-22 DIAGNOSIS — N182 Chronic kidney disease, stage 2 (mild): Secondary | ICD-10-CM | POA: Diagnosis not present

## 2020-10-22 DIAGNOSIS — R7301 Impaired fasting glucose: Secondary | ICD-10-CM | POA: Diagnosis not present

## 2020-10-22 DIAGNOSIS — E785 Hyperlipidemia, unspecified: Secondary | ICD-10-CM | POA: Diagnosis not present

## 2020-10-22 DIAGNOSIS — Z125 Encounter for screening for malignant neoplasm of prostate: Secondary | ICD-10-CM | POA: Diagnosis not present

## 2020-10-22 DIAGNOSIS — R82998 Other abnormal findings in urine: Secondary | ICD-10-CM | POA: Diagnosis not present

## 2020-10-26 DIAGNOSIS — H2513 Age-related nuclear cataract, bilateral: Secondary | ICD-10-CM | POA: Diagnosis not present

## 2020-10-29 DIAGNOSIS — Z950 Presence of cardiac pacemaker: Secondary | ICD-10-CM | POA: Diagnosis not present

## 2020-10-29 DIAGNOSIS — D6869 Other thrombophilia: Secondary | ICD-10-CM | POA: Diagnosis not present

## 2020-10-29 DIAGNOSIS — I509 Heart failure, unspecified: Secondary | ICD-10-CM | POA: Diagnosis not present

## 2020-10-29 DIAGNOSIS — N182 Chronic kidney disease, stage 2 (mild): Secondary | ICD-10-CM | POA: Diagnosis not present

## 2020-10-29 DIAGNOSIS — E785 Hyperlipidemia, unspecified: Secondary | ICD-10-CM | POA: Diagnosis not present

## 2020-10-29 DIAGNOSIS — I130 Hypertensive heart and chronic kidney disease with heart failure and stage 1 through stage 4 chronic kidney disease, or unspecified chronic kidney disease: Secondary | ICD-10-CM | POA: Diagnosis not present

## 2020-10-29 DIAGNOSIS — Z7901 Long term (current) use of anticoagulants: Secondary | ICD-10-CM | POA: Diagnosis not present

## 2020-10-29 DIAGNOSIS — I77810 Thoracic aortic ectasia: Secondary | ICD-10-CM | POA: Diagnosis not present

## 2020-10-29 DIAGNOSIS — Z0000 Encounter for general adult medical examination without abnormal findings: Secondary | ICD-10-CM | POA: Diagnosis not present

## 2020-10-29 DIAGNOSIS — D696 Thrombocytopenia, unspecified: Secondary | ICD-10-CM | POA: Diagnosis not present

## 2020-10-29 DIAGNOSIS — I2510 Atherosclerotic heart disease of native coronary artery without angina pectoris: Secondary | ICD-10-CM | POA: Diagnosis not present

## 2020-11-02 DIAGNOSIS — N138 Other obstructive and reflux uropathy: Secondary | ICD-10-CM | POA: Diagnosis not present

## 2020-11-02 DIAGNOSIS — N401 Enlarged prostate with lower urinary tract symptoms: Secondary | ICD-10-CM | POA: Diagnosis not present

## 2020-11-03 ENCOUNTER — Ambulatory Visit (INDEPENDENT_AMBULATORY_CARE_PROVIDER_SITE_OTHER): Payer: Medicare Other | Admitting: Orthopaedic Surgery

## 2020-11-03 ENCOUNTER — Encounter: Payer: Self-pay | Admitting: Orthopaedic Surgery

## 2020-11-03 DIAGNOSIS — I2510 Atherosclerotic heart disease of native coronary artery without angina pectoris: Secondary | ICD-10-CM | POA: Diagnosis not present

## 2020-11-03 DIAGNOSIS — M7021 Olecranon bursitis, right elbow: Secondary | ICD-10-CM

## 2020-11-03 NOTE — Progress Notes (Signed)
The patient is being seen today for recurrent olecranon bursitis of his right elbow.  We have aspirated this at least twice.  He said no other acute change in his medical status.  He is 86 years old.  He denies any fever and chills.  Examination of the right elbow shows a fluid collection over the olecranon area.  There is no redness and no drainage.  There is no pain associated with this with good range of motion of the elbow.  I did aspirate about 12 to 14 cc of fluid from the right elbow consistent with olecranon bursitis.  I explained that he will likely have a recurrence and that serial aspirations will help but eventually go away.  I would only recommend surgery if all else fails and he agrees with this treatment plan.  I would like to see him back in 2 weeks to see if we need to drain it again.  All questions and concerns were answered and addressed.

## 2020-11-08 ENCOUNTER — Other Ambulatory Visit: Payer: Self-pay | Admitting: Internal Medicine

## 2020-11-08 DIAGNOSIS — I77810 Thoracic aortic ectasia: Secondary | ICD-10-CM

## 2020-11-11 DIAGNOSIS — L821 Other seborrheic keratosis: Secondary | ICD-10-CM | POA: Diagnosis not present

## 2020-11-11 DIAGNOSIS — L570 Actinic keratosis: Secondary | ICD-10-CM | POA: Diagnosis not present

## 2020-11-11 DIAGNOSIS — Z85828 Personal history of other malignant neoplasm of skin: Secondary | ICD-10-CM | POA: Diagnosis not present

## 2020-11-16 DIAGNOSIS — Z7901 Long term (current) use of anticoagulants: Secondary | ICD-10-CM | POA: Diagnosis not present

## 2020-11-17 ENCOUNTER — Other Ambulatory Visit: Payer: Self-pay

## 2020-11-17 ENCOUNTER — Ambulatory Visit (INDEPENDENT_AMBULATORY_CARE_PROVIDER_SITE_OTHER): Payer: Medicare Other | Admitting: Orthopaedic Surgery

## 2020-11-17 ENCOUNTER — Encounter: Payer: Self-pay | Admitting: Orthopaedic Surgery

## 2020-11-17 DIAGNOSIS — M7021 Olecranon bursitis, right elbow: Secondary | ICD-10-CM | POA: Diagnosis not present

## 2020-11-17 DIAGNOSIS — I2510 Atherosclerotic heart disease of native coronary artery without angina pectoris: Secondary | ICD-10-CM

## 2020-11-17 NOTE — Progress Notes (Signed)
The patient is well-known to me.  He has been dealing with recurrent olecranon bursitis of the right elbow.  He is 86 years old.  We have aspirated this area at least 3 times.  There is still no evidence of infection on exam.  There is no pain with his elbow with full range of motion of the right side.  He does have a large fluid collection over the olecranon bursa.  I was able to aspirate at least 20 cc of fluid off this to fully decompress it.   There is a high likelihood this will continue to recur.  I would still recommend serial aspirations as opposed to surgery and he wants to try to avoid surgery.  I will see him back in 2 weeks to consider another aspiration of the right elbow olecranon bursa.  All question concerns were answered and addressed.

## 2020-11-25 ENCOUNTER — Ambulatory Visit (INDEPENDENT_AMBULATORY_CARE_PROVIDER_SITE_OTHER): Payer: Medicare Other

## 2020-11-25 DIAGNOSIS — I422 Other hypertrophic cardiomyopathy: Secondary | ICD-10-CM | POA: Diagnosis not present

## 2020-11-25 LAB — CUP PACEART REMOTE DEVICE CHECK
Battery Remaining Longevity: 111 mo
Battery Voltage: 3.07 V
Brady Statistic AP VP Percent: 89.28 %
Brady Statistic AP VS Percent: 0.41 %
Brady Statistic AS VP Percent: 8.46 %
Brady Statistic AS VS Percent: 1.85 %
Brady Statistic RA Percent Paced: 90.82 %
Brady Statistic RV Percent Paced: 97.74 %
Date Time Interrogation Session: 20220609123904
Implantable Lead Implant Date: 20060223
Implantable Lead Implant Date: 20060223
Implantable Lead Implant Date: 20220309
Implantable Lead Location: 753858
Implantable Lead Location: 753859
Implantable Lead Location: 753860
Implantable Lead Model: 4598
Implantable Lead Model: 5076
Implantable Lead Model: 5076
Implantable Pulse Generator Implant Date: 20220309
Lead Channel Impedance Value: 323 Ohm
Lead Channel Impedance Value: 342 Ohm
Lead Channel Impedance Value: 380 Ohm
Lead Channel Impedance Value: 380 Ohm
Lead Channel Impedance Value: 437 Ohm
Lead Channel Impedance Value: 456 Ohm
Lead Channel Impedance Value: 532 Ohm
Lead Channel Impedance Value: 532 Ohm
Lead Channel Impedance Value: 627 Ohm
Lead Channel Impedance Value: 627 Ohm
Lead Channel Impedance Value: 665 Ohm
Lead Channel Impedance Value: 874 Ohm
Lead Channel Impedance Value: 893 Ohm
Lead Channel Impedance Value: 950 Ohm
Lead Channel Pacing Threshold Amplitude: 0.625 V
Lead Channel Pacing Threshold Amplitude: 0.875 V
Lead Channel Pacing Threshold Amplitude: 2.375 V
Lead Channel Pacing Threshold Pulse Width: 0.4 ms
Lead Channel Pacing Threshold Pulse Width: 0.4 ms
Lead Channel Pacing Threshold Pulse Width: 0.4 ms
Lead Channel Sensing Intrinsic Amplitude: 2.375 mV
Lead Channel Sensing Intrinsic Amplitude: 2.375 mV
Lead Channel Sensing Intrinsic Amplitude: 5.875 mV
Lead Channel Sensing Intrinsic Amplitude: 5.875 mV
Lead Channel Setting Pacing Amplitude: 1.5 V
Lead Channel Setting Pacing Amplitude: 2.5 V
Lead Channel Setting Pacing Amplitude: 2.5 V
Lead Channel Setting Pacing Pulse Width: 0.4 ms
Lead Channel Setting Pacing Pulse Width: 0.4 ms
Lead Channel Setting Sensing Sensitivity: 1.2 mV

## 2020-11-30 ENCOUNTER — Encounter: Payer: Self-pay | Admitting: Internal Medicine

## 2020-11-30 ENCOUNTER — Other Ambulatory Visit: Payer: Self-pay

## 2020-11-30 ENCOUNTER — Ambulatory Visit (INDEPENDENT_AMBULATORY_CARE_PROVIDER_SITE_OTHER): Payer: Medicare Other | Admitting: Internal Medicine

## 2020-11-30 VITALS — BP 124/76 | HR 65 | Ht 75.0 in | Wt 175.8 lb

## 2020-11-30 DIAGNOSIS — I422 Other hypertrophic cardiomyopathy: Secondary | ICD-10-CM

## 2020-11-30 DIAGNOSIS — I2510 Atherosclerotic heart disease of native coronary artery without angina pectoris: Secondary | ICD-10-CM | POA: Diagnosis not present

## 2020-11-30 DIAGNOSIS — Z950 Presence of cardiac pacemaker: Secondary | ICD-10-CM

## 2020-11-30 DIAGNOSIS — I5022 Chronic systolic (congestive) heart failure: Secondary | ICD-10-CM

## 2020-11-30 DIAGNOSIS — I495 Sick sinus syndrome: Secondary | ICD-10-CM | POA: Diagnosis not present

## 2020-11-30 MED ORDER — SACUBITRIL-VALSARTAN 24-26 MG PO TABS: 1.0000 | ORAL_TABLET | Freq: Two times a day (BID) | ORAL | 3 refills | Status: DC

## 2020-11-30 NOTE — Patient Instructions (Addendum)
Medication Instructions:  Your physician has recommended you make the following change in your medication:   Stop Propafenone  *If you need a refill on your cardiac medications before your next appointment, please call your pharmacy*   Lab Work: None ordered.   If you have labs (blood work) drawn today and your tests are completely normal, you will receive your results only by: [place] (if you have MyChart) OR A paper copy in the mail If you have any lab test that is abnormal or we need to change your treatment, we will call you to review the results.   Testing/Procedures: Please call us with blood pressure readings next week    Follow-Up: At [facility], you and your health needs are our priority.  As part of our continuing mission to provide you with exceptional heart care, we have created designated Provider Care Teams.  These Care Teams include your primary Cardiologist (physician) and Advanced Practice Providers (APPs -  Physician Assistants and Nurse Practitioners) who all work together to provide you with the care you need, when you need it.  We recommend signing up for the patient portal called "MyChart".  Sign up information is provided on this After Visit Summary.  MyChart is used to connect with patients for Virtual Visits (Telemedicine).  Patients are able to view lab/test results, encounter notes, upcoming appointments, etc.  Non-urgent messages can be sent to your provider as well.   To learn more about what you can do with MyChart, go to [email or web address].    Your next appointment:   6 months with Dr Caryl Comes

## 2020-11-30 NOTE — Progress Notes (Signed)
Patient ID: Steven Macdonald, male   DOB: [birth year], 86 y.o.   MRN: [id number]         Patient Care Team: Crist Infante, MD as PCP - General   HPI  Steven Macdonald is a 86 y.o. male Seen in followup for AFlutter s/p RFCA He has had no recurrent arrhythmia.  He had a previously implanted dual-chamber Medtronic pacemaker 2006  for sinus node dysfunction      He has a history of pulmonary embolism and is on Coumadin.     Patient underwent CRT upgrade 3/21 because of progressive symptoms of exercise intolerance.  This is manifested mostly by fatigue and less mild dyspnea.  He still walks faster than his wife.  He has a history of hypotension but this has not been so much of an issue but there has been post exercise lightheadedness.  Post CRT he is only mildly improved if at all.  No chest pain or peripheral edema no palpitations or syncope.  11/20 he has had syncope temporally related to atrial arrhythmias.  The presumed mechanism was the triggering of the vasomotor reflex.  It was elected to utilize propafenone for rhythm control so as to protect for recurrent syncope.     DATE TEST EF   10/09 CTA  Ca score 0  12/13 Echo   25 %   1/15 Myoview  Multiple perfusion defects   1/15 Echo   45-50 %   10/16 Echo  45-50%   11/20 Echo  40-45%   6/21 CTA  4.1cm Aortic root  01/22 Echo 20-25%     Date Cr K Hgb  10/18   14.5   11/20 0.99 4.0 14.2  03/22 1.13 4.6 15.1      Past Medical History:  Diagnosis Date   Arthritis    Atrial flutter ([place])    RFCA    BPH (benign prostatic hypertrophy)    Cardiomyopathy, nonischemic -resolved    Echo 2008: EF 25% Cardiac CT 2009: Normal cors EF 31% Echo 3/10 EF 45% Echo 6/13: EF 50-55%     CHF (congestive heart failure) (HCC)    Chicken pox    Colon polyps    hyperplastic   Diabetes mellitus ([place])    ED (erectile dysfunction)    Gilbert's syndrome    possible   Hemorrhoids    Herniated cervical disc    HLD (hyperlipidemia)    HTN  (hypertension)    LBBB (left bundle branch block)    Orthostatic hypotension    Pacemaker  Medtronic    DOI 2/06   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[place])    Pulmonary embolism ([place])    Sinus node dysfunction ([place])         Past Surgical History:  Procedure Laterality Date   BIV UPGRADE N/A 08/25/2020   Procedure: BIV PPM UPGRADE;  Surgeon: Deboraha Sprang, MD;  Location: [facility];  Service: Cardiovascular;  Laterality: N/A;   CARDIOVERSION     coronary ablation     CYSTOSCOPY W/ TRANSURETHRAL RESECTION OF POSTERIOR URETHERAL VALVES     PACEMAKER GENERATOR CHANGE N/A 05/13/2014   Procedure: PACEMAKER GENERATOR CHANGE;  Surgeon: Deboraha Sprang, MD;  Location: [facility] CATH LAB;  Service: Cardiovascular;  Laterality: N/A;   PACEMAKER INSERTION      Current Outpatient Medications  Medication Sig Dispense Refill   Calcium Carb-Cholecalciferol (CALCIUM 600+D3) 600-800 MG-UNIT TABS Take 1 tablet by mouth daily.     cholecalciferol (VITAMIN D) 1000 UNITS tablet  Take 1,000 Units by mouth daily.     Cyanocobalamin (VITAMIN B-12) 2500 MCG SUBL Place 2,500 mcg under the tongue daily.     doxycycline (VIBRA-TABS) 100 MG tablet Take 100 mg by mouth 2 (two) times daily.     finasteride (PROSCAR) 5 MG tablet Take 5 mg by mouth every evening.     mirtazapine (REMERON) 15 MG tablet Take 15 mg by mouth at bedtime.     Omega-3 Fatty Acids (FISH OIL) 1200 MG CAPS Take 2,400 mg by mouth daily.     polyethylene glycol (MIRALAX / GLYCOLAX) 17 g packet Take 17 g by mouth daily as needed (constipation).     PRESCRIPTION MEDICATION Apply 1 application topically See admin instructions. Dermatological intment for wound care - apply to tailbone daily as needed for sores     Probiotic Product (ALIGN PO) Take 4 mg by mouth daily.     propafenone (RYTHMOL) 225 MG tablet Take 225 mg by mouth 2 (two) times daily.     PSYLLIUM PO Take 10 mLs by mouth daily as needed (constipation). Mix in 8 oz liquid and drink      sildenafil (REVATIO) 20 MG tablet Take 100 mg by mouth daily as needed (erectile dysfunction).     simvastatin (ZOCOR) 40 MG tablet Take 20 mg by mouth 2 (two) times daily.     tamsulosin (FLOMAX) 0.4 MG CAPS capsule Take 0.4 mg by mouth every morning.      warfarin (COUMADIN) 5 MG tablet Take 5-7.5 mg by mouth See admin instructions. Take 1 tablet (5 mg) by mouth on Sundays, Tuesdays, Thursdays & Saturdays. Take 1.5 tablets (7.5 mg) by mouth on Mondays, Wednesdays, & Fridays.     No current facility-administered medications for this visit.    No Known Allergies  Review of Systems negative except from HPI and PMH  Physical Exam BP 124/76   Pulse 65   Ht 6\' 3"  (1.905 m)   Wt 175 lb 12.8 oz (79.7 kg)   SpO2 97%   BMI 21.97 kg/m  Well developed and well nourished in no acute distress HENT normal Neck supple with JVP-flat Lungs Clear Device pocket well healed; without hematoma or erythema.  There is no tethering  Regular rate and rhythm, No rubs or gallop No murmur Abd-soft with active BS No Clubbing cyanosis No  edema Skin-warm and dry A & Oriented  Grossly normal sensory and motor function  ECG sinus with P-synchronous/ AV  pacing  Upright QRS V1 and Neg QRS Lead 1   Assessment and  Plan  Sinus node dysfunction  Dypsnea on exertion  Pacemaker-Medtronic .The patient's device was interrogated.  The information was reviewed. No changes were made in the programming.     Nonischemic cardiomyopathy-resolved  CHF chronic systolic  Atrial tachycardia temporally associated with syncope for which she is on propafenone  LBBB   Pulmonary Embolism  Chronic therapy  Depression  Increasing ventricular pacing     Unfortunately, post CRT he has not much improved.  He is not volume overloaded on exam.  Blood pressures are better and this may be a consequence of resynchronization or may simply be temporally artifactual.  We spent extensive time looking at AVN timing intervals  and RV LV offsets and changes AV interval from 130--200 and with this was able to maintain upright QRS in lead V1 and negative QRS in lead I and by making the LV offset +40 was able to shorten the QRS duration from 184 to  174 ms.  We also discussed discontinuing the propafenone.  We do this with some trepidation but the concern now is medications for his heart failure which are limited by his hypotension which may be aggravated by his propafenone.  The plan is going to be sequential 1-to stop the propafenone 2-to measure post exercise blood pressures 3-if the blood pressures remain in the 110+ range would like to start low-dose Entresto 24/26 his blood pressures are lower than 100 we will start him on an SGLT2, specifically Jardiance 10.  Thereafter, low-dose other guideline directed therapies like carvedilol/bisoprolol will be tried.      I,Stephanie Williams,acting as a Education administrator for Virl Axe, MD.,have documented all relevant documentation on the behalf of Virl Axe, MD,as directed by  Virl Axe, MD while in the presence of Virl Axe, MD.  I, Virl Axe, MD, have reviewed all documentation for this visit. The documentation on 11/30/20 for the exam, diagnosis, procedures, and orders are all accurate and complete.

## 2020-12-01 ENCOUNTER — Encounter: Payer: Self-pay | Admitting: Orthopaedic Surgery

## 2020-12-01 ENCOUNTER — Ambulatory Visit (INDEPENDENT_AMBULATORY_CARE_PROVIDER_SITE_OTHER): Payer: Medicare Other | Admitting: Orthopaedic Surgery

## 2020-12-01 DIAGNOSIS — I2510 Atherosclerotic heart disease of native coronary artery without angina pectoris: Secondary | ICD-10-CM | POA: Diagnosis not present

## 2020-12-01 DIAGNOSIS — M7021 Olecranon bursitis, right elbow: Secondary | ICD-10-CM

## 2020-12-01 NOTE — Progress Notes (Signed)
The patient continues to have olecranon bursitis that has been recurrent but there is right elbow.  I have aspirated several times.  He says it really does not bother him and he feels like it is getting less swollen.  He does have a fluid collection at his right elbow olecranon bursa area.  There is no redness.  I was able to aspirate about 10 cc of fluid from this area and completely decompressed it.  I placed a compressive dressing around it which he can remove an hour.  It does seem to be slowly getting better.  We can see him back in 4 weeks to see if we want to repeat an aspiration at that point and even place a steroid injection at that visit if needed.  All question concerns were answered and addressed.  He agrees with the treatment plan.

## 2020-12-02 ENCOUNTER — Ambulatory Visit: Payer: Medicare Other | Admitting: Orthopaedic Surgery

## 2020-12-06 ENCOUNTER — Telehealth: Payer: Self-pay | Admitting: Internal Medicine

## 2020-12-06 NOTE — Telephone Encounter (Signed)
BP reading:  June 15: 130/72 June 16: 121/65 June 17: 114/69 June 18: 115/63 June 19: 119/65  Patient states he was told if his BP was low he may need to be given something to raise his BP.

## 2020-12-07 DIAGNOSIS — Z7901 Long term (current) use of anticoagulants: Secondary | ICD-10-CM | POA: Diagnosis not present

## 2020-12-09 MED ORDER — SACUBITRIL-VALSARTAN 24-26 MG PO TABS: 1.0000 | ORAL_TABLET | Freq: Two times a day (BID) | ORAL | 1 refills | Status: DC

## 2020-12-09 MED ORDER — DRONEDARONE HCL 400 MG PO TABS: 400.0000 mg | ORAL_TABLET | Freq: Two times a day (BID) | ORAL | 6 refills | Status: DC

## 2020-12-09 NOTE — Telephone Encounter (Signed)
Pt is returning a call  

## 2020-12-09 NOTE — Telephone Encounter (Signed)
Spoke with pt and advised Per Dr Caryl Comes pt will start Multaq 400mg  - 1 tablet by mouth twice daily with a meal and Entresto 24/26 - 1 tablet by mouth twice daily.  Offered pt samples of new medication and pt declined. Pt verbalizes understanding and thanked Therapist, sports for the call.

## 2020-12-09 NOTE — Telephone Encounter (Signed)
Per Dr Caryl Comes pt will start Multaq 400mg  - 1 tablet by mouth twice daily with a meal and Entresto 24/26 - 1 tablet by mouth twice daily.  Attempted phone call to pt to advise re: new medications.  Left voicemail message to contact RN at [phone].

## 2020-12-09 NOTE — Telephone Encounter (Signed)
Patient was returning call 

## 2020-12-13 DIAGNOSIS — H26492 Other secondary cataract, left eye: Secondary | ICD-10-CM | POA: Diagnosis not present

## 2020-12-13 DIAGNOSIS — Z961 Presence of intraocular lens: Secondary | ICD-10-CM | POA: Diagnosis not present

## 2020-12-13 DIAGNOSIS — H26493 Other secondary cataract, bilateral: Secondary | ICD-10-CM | POA: Diagnosis not present

## 2020-12-13 DIAGNOSIS — I10 Essential (primary) hypertension: Secondary | ICD-10-CM | POA: Diagnosis not present

## 2020-12-13 DIAGNOSIS — Z7901 Long term (current) use of anticoagulants: Secondary | ICD-10-CM | POA: Diagnosis not present

## 2020-12-13 DIAGNOSIS — H16223 Keratoconjunctivitis sicca, not specified as Sjogren's, bilateral: Secondary | ICD-10-CM | POA: Diagnosis not present

## 2020-12-15 ENCOUNTER — Other Ambulatory Visit: Payer: Medicare Other

## 2020-12-17 NOTE — Progress Notes (Signed)
Remote pacemaker transmission.   

## 2020-12-21 DIAGNOSIS — Z7901 Long term (current) use of anticoagulants: Secondary | ICD-10-CM | POA: Diagnosis not present

## 2020-12-22 ENCOUNTER — Ambulatory Visit
Admission: RE | Admit: 2020-12-22 | Discharge: 2020-12-22 | Disposition: A | Discharge status: Home / Self Care | Payer: Medicare Other | Source: Ambulatory Visit | Attending: Internal Medicine | Admitting: Internal Medicine

## 2020-12-22 DIAGNOSIS — I77810 Thoracic aortic ectasia: Secondary | ICD-10-CM

## 2020-12-22 DIAGNOSIS — I700 Atherosclerosis of aorta: Secondary | ICD-10-CM | POA: Diagnosis not present

## 2020-12-22 DIAGNOSIS — I712 Thoracic aortic aneurysm, without rupture: Secondary | ICD-10-CM | POA: Diagnosis not present

## 2020-12-22 MED ORDER — IOPAMIDOL (ISOVUE-370) INJECTION 76%
75.0000 mL | Freq: Once | INTRAVENOUS | Status: AC | PRN
  Administered 2020-12-22: 75 mL via INTRAVENOUS

## 2020-12-28 ENCOUNTER — Telehealth: Payer: Self-pay | Admitting: *Deleted

## 2020-12-28 ENCOUNTER — Telehealth: Payer: Self-pay

## 2020-12-28 ENCOUNTER — Ambulatory Visit (INDEPENDENT_AMBULATORY_CARE_PROVIDER_SITE_OTHER): Payer: Medicare Other | Admitting: Internal Medicine

## 2020-12-28 ENCOUNTER — Encounter: Payer: Self-pay | Admitting: Internal Medicine

## 2020-12-28 ENCOUNTER — Other Ambulatory Visit: Payer: Self-pay

## 2020-12-28 VITALS — BP 128/78 | HR 76 | Ht 75.0 in | Wt 174.8 lb

## 2020-12-28 DIAGNOSIS — Z86010 Personal history of colonic polyps: Secondary | ICD-10-CM

## 2020-12-28 DIAGNOSIS — R194 Change in bowel habit: Secondary | ICD-10-CM

## 2020-12-28 DIAGNOSIS — K5900 Constipation, unspecified: Secondary | ICD-10-CM | POA: Diagnosis not present

## 2020-12-28 DIAGNOSIS — I2510 Atherosclerotic heart disease of native coronary artery without angina pectoris: Secondary | ICD-10-CM | POA: Diagnosis not present

## 2020-12-28 DIAGNOSIS — Z7901 Long term (current) use of anticoagulants: Secondary | ICD-10-CM

## 2020-12-28 MED ORDER — SUTAB 1479-225-188 MG PO TABS: ORAL_TABLET | ORAL | 0 refills | Status: DC

## 2020-12-28 NOTE — Telephone Encounter (Signed)
Electronic and manually faxed letters have both been sent to Dr Crist Infante requesting that patient be allowed to hold Coumadin 4 (four) days prior to his upcoming procedure (4 days as requested specifically by Dr Henrene Pastor) on 01/21/21. We will await return response.

## 2020-12-28 NOTE — Progress Notes (Signed)
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  He presents today on the encouragement of his primary care provider regarding worsening problems with constipation, personal history of adenomatous colon polyps, and requesting colonoscopy.  Patient was last seen in this office January 2020 regarding change in bowel habits as manifested by constipation as well as fecal smearing.  See that dictation for details.  At that time he was deemed phased out of routine colon cancer surveillance due to his age.  He was treated with fiber and MiraLAX.  Patient tells me that his constipation issues have worsened.  He describes them as terrible.  In addition to MiraLAX and fiber he uses Colace.  He denies bleeding.  Since I last saw him he had issues with syncope.  Pacemaker replacement.  He has continued on chronic anticoagulation.  He has had multiple previous colonoscopies beginning in 1999.  Most recent examination 2017 with 3 diminutive tubular adenomas.  Patient tells me that he is concerned about his change in bowel habits and possible colon cancer.  Review of blood work from August 20, 2020 shows unremarkable basic metabolic panel.  Normal CBC with hemoglobin 15.1.  INR 2.0.  Right upper quadrant ultrasound August 2019 revealed no significant abnormalities.  REVIEW OF SYSTEMS:  All non-GI ROS negative unless otherwise stated in the HPI except for sleeping problems, anxiety, arthritis, back pain, depression, fatigue  Past Medical History:  Diagnosis Date   Arthritis    Atrial flutter ([place])    RFCA    BPH (benign prostatic hypertrophy)    Cardiomyopathy, nonischemic -resolved    Echo 2008: EF 25%  Cardiac CT 2009: Normal cors EF 31% Echo 3/10 EF 45% Echo 6/13: EF 50-55%     CHF (congestive heart failure) (HCC)    Chicken pox    Colon polyps    hyperplastic   Diabetes mellitus ([place])    ED (erectile dysfunction)    Gilbert's syndrome    possible   Hemorrhoids    Herniated cervical disc    HLD (hyperlipidemia)    HTN (hypertension)    LBBB (left bundle branch block)    Orthostatic hypotension    Pacemaker  Medtronic    DOI 2/06   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[place])    Pulmonary embolism ([place])    Sinus node dysfunction ([place])         Past Surgical History:  Procedure Laterality Date   BIV UPGRADE N/A 08/25/2020   Procedure: BIV PPM UPGRADE;  Surgeon: Deboraha Sprang, MD;  Location: [facility];  Service: Cardiovascular;  Laterality: N/A;   CARDIOVERSION     coronary ablation     CYSTOSCOPY W/ TRANSURETHRAL RESECTION OF POSTERIOR URETHERAL VALVES     PACEMAKER GENERATOR CHANGE N/A 05/13/2014   Procedure: PACEMAKER GENERATOR CHANGE;  Surgeon: Deboraha Sprang, MD;  Location: [facility] CATH LAB;  Service: Cardiovascular;  Laterality: N/A;   PACEMAKER INSERTION      Social History Steven Macdonald  reports that he quit smoking about 50 years ago. His smoking use included cigarettes. He has never used smokeless tobacco. He reports current alcohol use. He reports that he does not use drugs.  family history includes Bladder Cancer in his father; Breast cancer in his  mother; Colon polyps in his father.  No Known Allergies     PHYSICAL EXAMINATION: Vital signs: BP 128/78   Pulse 76   Ht 6\' 3"  (1.905 m)   Wt 174 lb 12.8 oz (79.3 kg)   SpO2 96%   BMI 21.85 kg/m   Constitutional: Elderly but generally well-appearing, no acute distress Psychiatric: alert and oriented x3, cooperative Eyes: extraocular movements intact, anicteric, conjunctiva pink Mouth: oral pharynx moist, no lesions Neck: supple no lymphadenopathy Cardiovascular: heart regular rate and rhythm, no murmur Lungs: clear  to auscultation bilaterally Abdomen: soft, nontender, nondistended, no obvious ascites, no peritoneal signs, normal bowel sounds, no organomegaly Rectal: Deferred till colonoscopy Extremities: no clubbing or cyanosis.  Trace lower extremity edema bilaterally Skin: no lesions on visible extremities Neuro: No focal deficits.  Cranial nerves intact  ASSESSMENT:  1.  Worsening constipation as described.  Suspect slow transit constipation. 2.  History of multiple adenomatous colon polyps.  Last examination 2017 3.  Multiple medical problems including cardiomyopathy, history of atrial arrhythmia, and chronic anticoagulation therapy   PLAN:  1.  I discussed with the patient that the likelihood of colon cancer was small.  Despite this, he was very interested in pursuing examination.  I also explained that he is high risk given his age and comorbidities as well as the need to address his chronic anticoagulation therapy.  Despite all of this, he wishes to proceed.  His examination will need to be performed at the hospital due to his ejection fraction of roughly 25%.The nature of the procedure, as well as the risks, benefits, and alternatives were carefully and thoroughly reviewed with the patient. Ample time for discussion and questions allowed. The patient understood, was satisfied, and agreed to proceed.  2.  Told to increase MiraLAX to achieve desired effect.  We discussed a titration strategy. 3.  We will hold Coumadin 4 days prior to the procedure.  We will check with his primary provider, Dr. Joylene Draft to see if this is acceptable.  We will anticipate resumption of anticoagulation immediately post procedure

## 2020-12-28 NOTE — H&P (View-Only) (Signed)
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  He presents today on the encouragement of his primary care provider regarding worsening problems with constipation, personal history of adenomatous colon polyps, and requesting colonoscopy.  Patient was last seen in this office January 2020 regarding change in bowel habits as manifested by constipation as well as fecal smearing.  See that dictation for details.  At that time he was deemed phased out of routine colon cancer surveillance due to his age.  He was treated with fiber and MiraLAX.  Patient tells me that his constipation issues have worsened.  He describes them as terrible.  In addition to MiraLAX and fiber he uses Colace.  He denies bleeding.  Since I last saw him he had issues with syncope.  Pacemaker replacement.  He has continued on chronic anticoagulation.  He has had multiple previous colonoscopies beginning in 1999.  Most recent examination 2017 with 3 diminutive tubular adenomas.  Patient tells me that he is concerned about his change in bowel habits and possible colon cancer.  Review of blood work from August 20, 2020 shows unremarkable basic metabolic panel.  Normal CBC with hemoglobin 15.1.  INR 2.0.  Right upper quadrant ultrasound August 2019 revealed no significant abnormalities.  REVIEW OF SYSTEMS:  All non-GI ROS negative unless otherwise stated in the HPI except for sleeping problems, anxiety, arthritis, back pain, depression, fatigue  Past Medical History:  Diagnosis Date   Arthritis    Atrial flutter ([place])    RFCA    BPH (benign prostatic hypertrophy)    Cardiomyopathy, nonischemic -resolved    Echo 2008: EF 25%  Cardiac CT 2009: Normal cors EF 31% Echo 3/10 EF 45% Echo 6/13: EF 50-55%     CHF (congestive heart failure) (HCC)    Chicken pox    Colon polyps    hyperplastic   Diabetes mellitus ([place])    ED (erectile dysfunction)    Gilbert's syndrome    possible   Hemorrhoids    Herniated cervical disc    HLD (hyperlipidemia)    HTN (hypertension)    LBBB (left bundle branch block)    Orthostatic hypotension    Pacemaker  Medtronic    DOI 2/06   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)    Pulmonary embolism ([place])    Sinus node dysfunction ([place])         Past Surgical History:  Procedure Laterality Date   BIV UPGRADE N/A 08/25/2020   Procedure: BIV PPM UPGRADE;  Surgeon: Deboraha Sprang, MD;  Location: [facility] [facility];  Service: Cardiovascular;  Laterality: N/A;   CARDIOVERSION     coronary ablation     CYSTOSCOPY W/ TRANSURETHRAL RESECTION OF POSTERIOR URETHERAL VALVES     PACEMAKER GENERATOR CHANGE N/A 05/13/2014   Procedure: PACEMAKER GENERATOR CHANGE;  Surgeon: Deboraha Sprang, MD;  Location[facility] CATH LAB;  Service: Cardiovascular;  Laterality: N/A;   PACEMAKER INSERTION      Social History RHYLEN SHAHEEN  reports that he quit smoking about 50 years ago. His smoking use included cigarettes. He has never used smokeless tobacco. He reports current alcohol use. He reports that he does not use drugs.  family history includes Bladder Cancer in his father; Breast cancer in his  mother; Colon polyps in his father.  No Known Allergies     PHYSICAL EXAMINATION: Vital signs: BP 128/78   Pulse 76   Ht 6\' 3"  (1.905 m)   Wt 174 lb 12.8 oz (79.3 kg)   SpO2 96%   BMI 21.85 kg/m   Constitutional: Elderly but generally well-appearing, no acute distress Psychiatric: alert and oriented x3, cooperative Eyes: extraocular movements intact, anicteric, conjunctiva pink Mouth: oral pharynx moist, no lesions Neck: supple no lymphadenopathy Cardiovascular: heart regular rate and rhythm, no murmur Lungs: clear  to auscultation bilaterally Abdomen: soft, nontender, nondistended, no obvious ascites, no peritoneal signs, normal bowel sounds, no organomegaly Rectal: Deferred till colonoscopy Extremities: no clubbing or cyanosis.  Trace lower extremity edema bilaterally Skin: no lesions on visible extremities Neuro: No focal deficits.  Cranial nerves intact  ASSESSMENT:  1.  Worsening constipation as described.  Suspect slow transit constipation. 2.  History of multiple adenomatous colon polyps.  Last examination 2017 3.  Multiple medical problems including cardiomyopathy, history of atrial arrhythmia, and chronic anticoagulation therapy   PLAN:  1.  I discussed with the patient that the likelihood of colon cancer was small.  Despite this, he was very interested in pursuing examination.  I also explained that he is high risk given his age and comorbidities as well as the need to address his chronic anticoagulation therapy.  Despite all of this, he wishes to proceed.  His examination will need to be performed at the hospital due to his ejection fraction of roughly 25%.The nature of the procedure, as well as the risks, benefits, and alternatives were carefully and thoroughly reviewed with the patient. Ample time for discussion and questions allowed. The patient understood, was satisfied, and agreed to proceed.  2.  Told to increase MiraLAX to achieve desired effect.  We discussed a titration strategy. 3.  We will hold Coumadin 4 days prior to the procedure.  We will check with his primary provider, Dr. Joylene Draft to see if this is acceptable.  We will anticipate resumption of anticoagulation immediately post procedure

## 2020-12-28 NOTE — Patient Instructions (Signed)
You have been scheduled for a colonoscopy. Please follow written instructions given to you at your visit today.  Please pick up your prep supplies at the pharmacy within the next 1-3 days. If you use inhalers (even only as needed), please bring them with you on the day of your procedure.  If you are age 84 or older, your body mass index should be between 23-30. Your Body mass index is 21.85 kg/m. If this is out of the aforementioned range listed, please consider follow up with your Primary Care Provider. _________________________________________________________ The [facility] providers would like to encourage you to use [facility] to communicate with providers for non-urgent requests or questions.  Due to long hold times on the telephone, sending your provider a message by [facility] may be a faster and more efficient way to get a response.  Please allow 48 business hours for a response.  Please remember that this is for non-urgent requests.   Due to recent changes in healthcare laws, you may see the results of your imaging and laboratory studies on MyChart before your provider has had a chance to review them.  We understand that in some cases there may be results that are confusing or concerning to you. Not all laboratory results come back in the same time frame and the provider may be waiting for multiple results in order to interpret others.  Please give [place] 48 hours in order for your provider to thoroughly review all the results before contacting the office for clarification of your results.

## 2020-12-29 ENCOUNTER — Ambulatory Visit: Payer: Medicare Other | Admitting: Orthopaedic Surgery

## 2020-12-29 NOTE — Telephone Encounter (Signed)
Spoke to patient and let him know that his procedure needed to be moved to the hospital because his ejection fraction was too low for the lec.  I told him that I had scheduled it at [facility] on 01/20/2021 at 11:15am.  I told patient I would mail him  new instructions and to let me know if he had any questions.  Patient agreed.

## 2021-01-04 NOTE — Telephone Encounter (Signed)
We received correspondence from Dr Crist Infante dated 01/04/21 on our original letter stating: "He may hold his coumadin per your instructions for his colonoscopy."  *see original note under "media" tab in Epic.  I have contacted the patient and advised that per Dr Joylene Draft, he may hold his coumadin 4 days prior to his upcoming colonoscopy procedure on 01/21/21. Patient has repeated these instructions back to me and verbalizes clear understanding.

## 2021-01-05 DIAGNOSIS — H26491 Other secondary cataract, right eye: Secondary | ICD-10-CM | POA: Diagnosis not present

## 2021-01-20 ENCOUNTER — Encounter (HOSPITAL_COMMUNITY)
Admission: RE | Disposition: A | Discharge status: Home / Self Care | Payer: Self-pay | Source: Home / Self Care | Attending: Internal Medicine

## 2021-01-20 ENCOUNTER — Ambulatory Visit (HOSPITAL_COMMUNITY): Payer: Medicare Other | Admitting: Registered Nurse

## 2021-01-20 ENCOUNTER — Encounter (HOSPITAL_COMMUNITY): Payer: Self-pay | Admitting: Internal Medicine

## 2021-01-20 ENCOUNTER — Ambulatory Visit (HOSPITAL_COMMUNITY)
Admission: RE | Admit: 2021-01-20 | Discharge: 2021-01-20 | Disposition: A | Discharge status: Home / Self Care | Payer: Medicare Other | Attending: Internal Medicine | Admitting: Internal Medicine

## 2021-01-20 ENCOUNTER — Other Ambulatory Visit: Payer: Self-pay

## 2021-01-20 DIAGNOSIS — K5900 Constipation, unspecified: Secondary | ICD-10-CM | POA: Insufficient documentation

## 2021-01-20 DIAGNOSIS — D122 Benign neoplasm of ascending colon: Secondary | ICD-10-CM

## 2021-01-20 DIAGNOSIS — R194 Change in bowel habit: Secondary | ICD-10-CM

## 2021-01-20 DIAGNOSIS — D123 Benign neoplasm of transverse colon: Secondary | ICD-10-CM

## 2021-01-20 DIAGNOSIS — K648 Other hemorrhoids: Secondary | ICD-10-CM | POA: Insufficient documentation

## 2021-01-20 DIAGNOSIS — Z86711 Personal history of pulmonary embolism: Secondary | ICD-10-CM | POA: Insufficient documentation

## 2021-01-20 DIAGNOSIS — Z950 Presence of cardiac pacemaker: Secondary | ICD-10-CM | POA: Diagnosis not present

## 2021-01-20 DIAGNOSIS — Z1211 Encounter for screening for malignant neoplasm of colon: Secondary | ICD-10-CM | POA: Diagnosis not present

## 2021-01-20 DIAGNOSIS — I429 Cardiomyopathy, unspecified: Secondary | ICD-10-CM | POA: Insufficient documentation

## 2021-01-20 DIAGNOSIS — Z7901 Long term (current) use of anticoagulants: Secondary | ICD-10-CM | POA: Insufficient documentation

## 2021-01-20 DIAGNOSIS — Z87891 Personal history of nicotine dependence: Secondary | ICD-10-CM | POA: Insufficient documentation

## 2021-01-20 DIAGNOSIS — I471 Supraventricular tachycardia: Secondary | ICD-10-CM | POA: Diagnosis not present

## 2021-01-20 DIAGNOSIS — Z86010 Personal history of colonic polyps: Secondary | ICD-10-CM

## 2021-01-20 DIAGNOSIS — K635 Polyp of colon: Secondary | ICD-10-CM | POA: Diagnosis not present

## 2021-01-20 HISTORY — PX: COLONOSCOPY WITH PROPOFOL: SHX5780

## 2021-01-20 HISTORY — PX: POLYPECTOMY: SHX5525

## 2021-01-20 SURGERY — COLONOSCOPY WITH PROPOFOL
Anesthesia: Monitor Anesthesia Care

## 2021-01-20 MED ORDER — LACTATED RINGERS IV SOLN: INTRAVENOUS | Status: DC

## 2021-01-20 MED ORDER — ALBUMIN HUMAN 5 % IV SOLN: INTRAVENOUS | Status: DC | PRN

## 2021-01-20 MED ORDER — ETOMIDATE 2 MG/ML IV SOLN
INTRAVENOUS | Status: DC | PRN
  Administered 2021-01-20 (×2): 10 mg via INTRAVENOUS

## 2021-01-20 MED ORDER — PROPOFOL 500 MG/50ML IV EMUL
INTRAVENOUS | Status: DC | PRN
  Administered 2021-01-20: 50 ug/kg/min via INTRAVENOUS

## 2021-01-20 MED ORDER — SODIUM CHLORIDE 0.9 % IV SOLN: INTRAVENOUS | Status: DC

## 2021-01-20 MED ORDER — PHENYLEPHRINE HCL (PRESSORS) 10 MG/ML IV SOLN
INTRAVENOUS | Status: AC
  Filled 2021-01-20: qty 1

## 2021-01-20 SURGICAL SUPPLY — 24 items

## 2021-01-20 NOTE — Discharge Instructions (Addendum)
   YOU SHOULD EXPECT: Some feelings of bloating in the abdomen. Passage of more gas than usual. Walking can help get rid of the air that was put into your GI tract during the procedure and reduce the bloating. If you had a lower endoscopy (such as a colonoscopy or flexible sigmoidoscopy) you may notice spotting of blood in your stool or on the toilet paper. Some abdominal soreness may be present for a day or two, also.  DIET: Your first meal following the procedure should be a light meal and then it is ok to progress to your normal diet. A half-sandwich or bowl of soup is an example of a good first meal. Heavy or fried foods are harder to digest and may make you feel nauseous or bloated. Drink plenty of fluids but you should avoid alcoholic beverages for 24 hours. If you had a esophageal dilation, please see attached instructions for diet.    ACTIVITY: Your care partner should take you home directly after the procedure. You should plan to take it easy, moving slowly for the rest of the day. You can resume normal activity the day after the procedure however YOU SHOULD NOT DRIVE, use power tools, machinery or perform tasks that involve climbing or major physical exertion for 24 hours (because of the sedation medicines used during the test).   SYMPTOMS TO REPORT IMMEDIATELY: A gastroenterologist can be reached at any hour. Please call [phone]  for any of the following symptoms:  Following lower endoscopy (colonoscopy, flexible sigmoidoscopy) Excessive amounts of blood in the stool  Significant tenderness, worsening of abdominal pains  Swelling of the abdomen that is new, acute  Fever of 100 or higher   FOLLOW UP:  If any biopsies were taken you will be contacted by phone or by letter within the next 1-3 weeks. Call [phone]  if you have not heard about the biopsies in 3 weeks.  Please also call with any specific questions about appointments or follow up tests.

## 2021-01-20 NOTE — Transfer of Care (Signed)
Immediate Anesthesia Transfer of Care Note  Patient: Steven Macdonald  Procedure(s) Performed: COLONOSCOPY WITH PROPOFOL POLYPECTOMY  Patient Location: PACU  Anesthesia Type:MAC  Level of Consciousness: awake, alert  and patient cooperative  Airway & Oxygen Therapy: Patient Spontanous Breathing and Patient connected to face mask oxygen  Post-op Assessment: Report given to RN and Post -op Vital signs reviewed and stable  Post vital signs: stable  Last Vitals:  Vitals Value Taken Time  BP 105/64 01/20/21 1225  Temp    Pulse 60 01/20/21 1225  Resp 24 01/20/21 1225  SpO2 100 % 01/20/21 1225  Vitals shown include unvalidated device data.  Last Pain:  Vitals:   01/20/21 1008  TempSrc: Oral  PainSc: 0-No pain         Complications: No notable events documented.

## 2021-01-20 NOTE — Anesthesia Postprocedure Evaluation (Signed)
Anesthesia Post Note  Patient: Steven Macdonald  Procedure(s) Performed: COLONOSCOPY WITH PROPOFOL POLYPECTOMY     Patient location during evaluation: Endoscopy Anesthesia Type: MAC Level of consciousness: awake and alert, patient cooperative and oriented Pain management: pain level controlled Vital Signs Assessment: post-procedure vital signs reviewed and stable Respiratory status: spontaneous breathing, nonlabored ventilation and respiratory function stable Cardiovascular status: blood pressure returned to baseline and stable Postop Assessment: no apparent nausea or vomiting Anesthetic complications: no   No notable events documented.  Last Vitals:  Vitals:   01/20/21 1230 01/20/21 1235  BP: 121/74 123/78  Pulse: 60 60  Resp: 18 17  Temp:    SpO2: 100% 100%    Last Pain:  Vitals:   01/20/21 1235  TempSrc:   PainSc: 0-No pain                 Latica Hohmann,E. Remmy Crass

## 2021-01-20 NOTE — Anesthesia Preprocedure Evaluation (Addendum)
Anesthesia Evaluation  Patient identified by MRN, date of birth, ID band Patient awake    Reviewed: Allergy & Precautions, NPO status , Patient's Chart, lab work & pertinent test results  History of Anesthesia Complications Negative for: history of anesthetic complications  Airway Mallampati: I  TM Distance: >3 FB Neck ROM: Full    Dental  (+) Missing, Dental Advisory Given   Pulmonary former smoker, PE   breath sounds clear to auscultation       Cardiovascular hypertension, +CHF (entresto)  + dysrhythmias Atrial Fibrillation + pacemaker (changed to BiV pacer)  Rhythm:Regular Rate:Normal  06/2020 ECHO: EF 20- 25%. LV has severely decreased function, global hypokinesis, cavity size  was mildly dilated, severe LVH of the basal-septal segment. Grade I DD, mild MR, mild AI   Neuro/Psych negative neurological ROS     GI/Hepatic negative GI ROS, Neg liver ROS,   Endo/Other  diabetes (diet controlled)  Renal/GU negative Renal ROS     Musculoskeletal  (+) Arthritis ,   Abdominal   Peds  Hematology Coumadin   Anesthesia Other Findings   Reproductive/Obstetrics                            Anesthesia Physical Anesthesia Plan  ASA: 4  Anesthesia Plan: MAC   Post-op Pain Management:    Induction:   PONV Risk Score and Plan: 1 and Ondansetron and Treatment may vary due to age or medical condition  Airway Management Planned: Natural Airway and Simple Face Mask  Additional Equipment: None  Intra-op Plan:   Post-operative Plan:   Informed Consent: I have reviewed the patients History and Physical, chart, labs and discussed the procedure including the risks, benefits and alternatives for the proposed anesthesia with the patient or authorized representative who has indicated his/her understanding and acceptance.     Dental advisory given  Plan Discussed with: CRNA and Surgeon  Anesthesia  Plan Comments:        Anesthesia Quick Evaluation

## 2021-01-20 NOTE — Op Note (Signed)
[facility] Patient Name: Steven Macdonald Procedure Date: 01/20/2021 MRN: [id number] Attending MD: Docia Chuck. Henrene Pastor , MD Date of Birth: 29-May-1935 CSN: [id number] Age: 86 Admit Type: Outpatient Procedure:                Colonoscopy with cold snare polypectomy x 2 Indications:              High risk colon cancer surveillance: Personal                            history of multiple (3 or more) adenomas,                            Incidental change in bowel habits noted, Incidental                            constipation noted. Most recent prior examination                            2017 Providers:                Docia Chuck. Henrene Pastor, MD, Brooke Person, Tyna Jaksch                            Technician Referring MD:             Crist Infante, MD Medicines:                Monitored Anesthesia Care Complications:            No immediate complications. Estimated blood loss:                            None. Estimated Blood Loss:     Estimated blood loss: none. Procedure:                Pre-Anesthesia Assessment:                           - Prior to the procedure, a History and Physical                            was performed, and patient medications and                            allergies were reviewed. The patient's tolerance of                            previous anesthesia was also reviewed. The risks                            and benefits of the procedure and the sedation                            options and risks were discussed with the patient.                            All  questions were answered, and informed consent                            was obtained. Prior Anticoagulants: The patient has                            taken Coumadin (warfarin), last dose was 4 days                            prior to procedure. ASA Grade Assessment: III - A                            patient with severe systemic disease. After                            reviewing the risks and benefits,  the patient was                            deemed in satisfactory condition to undergo the                            procedure.                           After obtaining informed consent, the colonoscope                            was passed under direct vision. Throughout the                            procedure, the patient's blood pressure, pulse, and                            oxygen saturations were monitored continuously. The                            [id number]) Olympus colonoscope was                            introduced through the anus and advanced to the the                            cecum, identified by appendiceal orifice and                            ileocecal valve. The ileocecal valve, appendiceal                            orifice, and rectum were photographed. The quality                            of the bowel preparation was excellent. The  colonoscopy was performed without difficulty. The                            patient tolerated the procedure well. The bowel                            preparation used was SUPREP via split dose                            instruction. Scope In: 11:55:45 AM Scope Out: 12:16:39 PM Scope Withdrawal Time: 0 hours 10 minutes 56 seconds  Total Procedure Duration: 0 hours 20 minutes 54 seconds  Findings:      Two polyps were found in the transverse colon and ascending colon. The       polyps were 4 mm in size. These polyps were removed with a cold snare.       Resection and retrieval were complete.      Internal hemorrhoids were found during retroflexion. The hemorrhoids       were small.      The exam was otherwise without abnormality on direct and retroflexion       views. Impression:               - Two 4 mm polyps in the transverse colon and in                            the ascending colon, removed with a cold snare.                            Resected and retrieved.                            - Internal hemorrhoids.                           - The examination was otherwise normal on direct                            and retroflexion views. Moderate Sedation:      none Recommendation:           - Repeat colonoscopy is not recommended for                            surveillance.                           - Resume Coumadin (warfarin) today at prior dose.                           - Patient has a contact number available for                            emergencies. The signs and symptoms of potential                            delayed complications were discussed with the  patient. Return to normal activities tomorrow.                            Written discharge instructions were provided to the                            patient.                           - Resume previous diet.                           - Continue present medications.                           - Await pathology results.                           - Laxative of choice for constipation. Recommend                            MiraLAX daily. The dose can be safely adjusted to                            achieve desired result                           - Return to the care of Dr. Joylene Draft Procedure Code(s):        --- Professional ---                           [phone], Colonoscopy, flexible; with removal of                            tumor(s), polyp(s), or other lesion(s) by snare                            technique Diagnosis Code(s):        --- Professional ---                           Z86.010, Personal history of colonic polyps                           K63.5, Polyp of colon                           K64.8, Other hemorrhoids CPT copyright 2019 [employer]. All rights reserved. The codes documented in this report are preliminary and upon coder review may  be revised to meet current compliance requirements. Docia Chuck. Henrene Pastor, MD 01/20/2021 12:24:35 PM This report has been signed  electronically. Number of Addenda: 0

## 2021-01-20 NOTE — Interval H&P Note (Signed)
History and Physical Interval Note:  01/20/2021 10:59 AM  Steven Macdonald  has presented today for surgery, with the diagnosis of hx of colon polyps; constipation; change in bowel habits.  The various methods of treatment have been discussed with the patient and family. After consideration of risks, benefits and other options for treatment, the patient has consented to  Procedure(s): COLONOSCOPY WITH PROPOFOL (N/A) as a surgical intervention.  The patient's history has been reviewed, patient examined, no change in status, stable for surgery.  I have reviewed the patient's chart and labs.  Questions were answered to the patient's satisfaction.    The patient was seen in the preoperative area preprocedure.  He tolerated his prep.  Wife at bedside.  No interval clinical changes.  He is now for colonoscopy to evaluate worsening constipation and a history of multiple adenomatous colon polyps   Steven Macdonald

## 2021-01-21 ENCOUNTER — Encounter: Payer: Medicare Other | Admitting: Internal Medicine

## 2021-01-21 ENCOUNTER — Encounter (HOSPITAL_COMMUNITY): Payer: Self-pay | Admitting: Internal Medicine

## 2021-01-21 LAB — SURGICAL PATHOLOGY

## 2021-01-25 DIAGNOSIS — Z7901 Long term (current) use of anticoagulants: Secondary | ICD-10-CM | POA: Diagnosis not present

## 2021-02-07 ENCOUNTER — Telehealth: Payer: Self-pay

## 2021-02-07 DIAGNOSIS — Z7901 Long term (current) use of anticoagulants: Secondary | ICD-10-CM | POA: Diagnosis not present

## 2021-02-07 NOTE — Telephone Encounter (Signed)
Received call from the front desk who states pt walked in stating Multaq medication is $600 per month.  He is asking if medication can be changed to something less expensive.  Encouraged pt to apply for patient assistance.  Pt states he would not qualify due to his current monthly income. Will forward to Dr Caryl Comes and Oda Kilts, PA-C for review and recommendation.  Pt has one more Multaq for tomorrow.  Pt verbalizes understanding and agrees with current plan.

## 2021-02-08 ENCOUNTER — Other Ambulatory Visit: Payer: Self-pay

## 2021-02-08 DIAGNOSIS — I5022 Chronic systolic (congestive) heart failure: Secondary | ICD-10-CM

## 2021-02-08 NOTE — Telephone Encounter (Signed)
I called pt and gave him these instructions. He is aware that someone will call him to schedule his Echo and then we will schedule his f/u with AT. Pt understands if he has any syncopal episodes he is to go to the ED.

## 2021-02-09 ENCOUNTER — Other Ambulatory Visit: Payer: Self-pay

## 2021-02-09 ENCOUNTER — Ambulatory Visit (HOSPITAL_COMMUNITY): Payer: Medicare Other | Attending: Cardiovascular Disease

## 2021-02-09 DIAGNOSIS — I5022 Chronic systolic (congestive) heart failure: Secondary | ICD-10-CM | POA: Insufficient documentation

## 2021-02-09 LAB — ECHOCARDIOGRAM COMPLETE
Area-P 1/2: 1.74 cm2
P 1/2 time: 782 msec
S' Lateral: 3.8 cm

## 2021-02-24 NOTE — Progress Notes (Signed)
Electrophysiology Office Note Date: 02/25/2021  ID:  Steven Macdonald, DOB [birth year], MRN [id number]  PCP: Crist Infante, MD Primary Cardiologist: None Electrophysiologist: Virl Axe, MD   CC: Pacemaker follow-up  Steven Macdonald is a 86 y.o. male seen today for Virl Axe, MD for routine electrophysiology followup.  Since last being seen in our clinic the patient reports doing well. He is currently off any AAD. He has not had any symptomatic tachycardia. He had AT/SVT on device most recently 8/5, this was in the midst of Colon Prep. He overall does have some functional status improvement, and is very excited to have had mild EF improvement s/p CRT. Currently, he denies chest pain, palpitations, PND, orthopnea, nausea, vomiting, dizziness, syncope, edema, weight gain, or early satiety.  Device History: Medtronic Dual Chamber PPM implanted 2006, gen change 2015, BiV upgrade 08/2020  Past Medical History:  Diagnosis Date   Arthritis    Atrial flutter (HCC)    RFCA    BPH (benign prostatic hypertrophy)    Cardiomyopathy, nonischemic -resolved    Echo 2008: EF 25% Cardiac CT 2009: Normal cors EF 31% Echo 3/10 EF 45% Echo 6/13: EF 50-55%     CHF (congestive heart failure) (HCC)    Chicken pox    Colon polyps    hyperplastic   Diabetes mellitus ([place])    ED (erectile dysfunction)    Gilbert's syndrome    possible   Hemorrhoids    Herniated cervical disc    HLD (hyperlipidemia)    HTN (hypertension)    LBBB (left bundle branch block)    Orthostatic hypotension    Pacemaker  Medtronic    DOI 2/06   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[place])    Pulmonary embolism ([place])    Sinus node dysfunction ([place])        Past Surgical History:  Procedure Laterality Date   BIV UPGRADE N/A 08/25/2020   Procedure: BIV PPM UPGRADE;  Surgeon: Deboraha Sprang, MD;  Location: [facility];  Service: Cardiovascular;  Laterality: N/A;   CARDIOVERSION     COLONOSCOPY WITH PROPOFOL N/A 01/20/2021    Procedure: COLONOSCOPY WITH PROPOFOL;  Surgeon: Irene Shipper, MD;  Location: [facility];  Service: Endoscopy;  Laterality: N/A;   coronary ablation     CYSTOSCOPY W/ TRANSURETHRAL RESECTION OF POSTERIOR URETHERAL VALVES     PACEMAKER GENERATOR CHANGE N/A 05/13/2014   Procedure: PACEMAKER GENERATOR CHANGE;  Surgeon: Deboraha Sprang, MD;  Location: [facility];  Service: Cardiovascular;  Laterality: N/A;   PACEMAKER INSERTION     POLYPECTOMY  01/20/2021   Procedure: POLYPECTOMY;  Surgeon: Irene Shipper, MD;  Location: [facility];  Service: Endoscopy;;    Current Outpatient Medications  Medication Sig Dispense Refill   Calcium Carb-Cholecalciferol (CALCIUM 600+D3) 600-800 MG-UNIT TABS Take 1 tablet by mouth daily.     cholecalciferol (VITAMIN D) 1000 UNITS tablet Take 1,000 Units by mouth daily.     Cyanocobalamin (VITAMIN B-12) 2500 MCG SUBL Place 2,500 mcg under the tongue daily.     finasteride (PROSCAR) 5 MG tablet Take 5 mg by mouth every evening.     mirtazapine (REMERON) 15 MG tablet Take 15 mg by mouth at bedtime.     Omega-3 Fatty Acids (FISH OIL) 1200 MG CAPS Take 2,400 mg by mouth daily.     polyethylene glycol (MIRALAX / GLYCOLAX) 17 g packet Take 17 g by mouth daily as needed (constipation).     PRESCRIPTION MEDICATION Apply 1  application topically See admin instructions. Dermatological intment for wound care - apply to tailbone daily as needed for sores     Probiotic Product (ALIGN PO) Take 4 mg by mouth daily.     PSYLLIUM PO Take 10 mLs by mouth daily as needed (constipation). Mix in 8 oz liquid and drink     sacubitril-valsartan (ENTRESTO) 24-26 MG Take 1 tablet by mouth 2 (two) times daily. 180 tablet 1   sildenafil (REVATIO) 20 MG tablet Take 100 mg by mouth daily as needed (erectile dysfunction).     simvastatin (ZOCOR) 40 MG tablet Take 20 mg by mouth 2 (two) times daily.     Sodium Sulfate-Mag Sulfate-KCl (SUTAB) [phone] MG TABS Use as directed for colonoscopy.  MANUFACTURER CODES!! BIN: [id number] PCN: CN GROUP: [id number] MEMBER ID: [id number] AS SECONDARY INSURANCE ;NO PRIOR AUTHORIZATION 24 tablet 0   tamsulosin (FLOMAX) 0.4 MG CAPS capsule Take 0.4 mg by mouth every morning.      warfarin (COUMADIN) 5 MG tablet Take 5-7.5 mg by mouth See admin instructions. Take 1 tablet (5 mg) by mouth on Sundays, Tuesdays, Thursdays & Saturdays. Take 1.5 tablets (7.5 mg) by mouth on Mondays, Wednesdays, & Fridays.     No current facility-administered medications for this visit.    Allergies:   Patient has no known allergies.   Social History: Social History   Socioeconomic History   Marital status: Married    Spouse name: Not on file   Number of children: 4   Years of education: Not on file   Highest education level: Not on file  Occupational History   Occupation: Insurance underwriter: SELF-EMPLOYED  Tobacco Use   Smoking status: Former    Types: Cigarettes    Quit date: 10/17/1970    Years since quitting: 50.3   Smokeless tobacco: Never  Vaping Use   Vaping Use: Never used  Substance and Sexual Activity   Alcohol use: Yes    Comment: Drink every day   Drug use: No   Sexual activity: Not on file  Other Topics Concern   Not on file  Social History Narrative   Not on file   Social Determinants of Health   Financial Resource Strain: Not on file  Food Insecurity: Not on file  Transportation Needs: Not on file  Physical Activity: Not on file  Stress: Not on file  Social Connections: Not on file  Intimate Partner Violence: Not on file    Family History: Family History  Problem Relation Age of Onset   Breast cancer Mother    Colon polyps Father        pre-cancerous   Bladder Cancer Father      Review of Systems: All other systems reviewed and are otherwise negative except as noted above.  Physical Exam: Vitals:   02/25/21 1011  BP: (!) 109/54  Pulse: 61  SpO2: 98%  Weight: 173 lb (78.5 kg)  Height: '6\' 3"'$  (1.905 m)      GEN- The patient is well appearing, alert and oriented x 3 today.   HEENT: normocephalic, atraumatic; sclera clear, conjunctiva pink; hearing intact; oropharynx clear; neck supple  Lungs- Clear to ausculation bilaterally, normal work of breathing.  No wheezes, rales, rhonchi Heart- Regular rate and rhythm, no murmurs, rubs or gallops  GI- soft, non-tender, non-distended, bowel sounds present  Extremities- no clubbing or cyanosis. No edema MS- no significant deformity or atrophy Skin- warm and dry, no rash or lesion; PPM pocket well healed Psych-  euthymic mood, full affect Neuro- strength and sensation are intact  PPM Interrogation- reviewed in detail today,  See PACEART report  EKG:  EKG is not ordered today.   Recent Labs: 08/20/2020: BUN 19; Creatinine, Ser 1.13; Hemoglobin 15.1; Platelets 126; Potassium 4.6; Sodium 140   Wt Readings from Last 3 Encounters:  02/25/21 173 lb (78.5 kg)  01/20/21 170 lb (77.1 kg)  12/28/20 174 lb 12.8 oz (79.3 kg)     Other studies Reviewed: Additional studies/ records that were reviewed today include: Previous EP office notes, Previous remote checks, Most recent labwork.   Assessment and Plan:  1. SND s/p Medtronic PPM s/p BiV upgrade 08/25/2020 Normal PPM function See Pace Art report No changes today  2. Atrial tachycardia Propafenone was stopped with low EF. Multaq was too expensive. Discussed amiodarone and risks vs benefits. He has not had syncope in ~ 2 years. His atrial arrhythmias are for the most part controlled. He has them about once monthly, and usually short. He had a recent flurry of atrial activity in the setting of prepping for colonoscopy, but was asymptomatic. He wishes to remain OFF AAD for the time being due to [facility] side effects. He is willing to start low dose BB as below.   3. Chronic systolic CHF Echo [id number] LVEF 30-35% NYHA II-III symptoms Volume status  Continue entresto.  Add bisoprolol 2.5 mg qhs. Titrate up as  tolerated. Pressures have been borderline soft at home.  Could consider Jardiance next.  Current medicines are reviewed at length with the patient today.    Labs/ tests ordered today include:  Orders Placed This Encounter  Procedures   Basic metabolic panel   CBC   Disposition:   Follow up with Dr. Caryl Comes in 2-3 months to re-assess atrial burdens off AAD, and further discuss options per patient request.   Signed, Shirley Friar, PA-C  02/25/2021 10:35 AM  [facility] [address] [place] [address] [phone] (office) [phone] (fax)

## 2021-02-25 ENCOUNTER — Encounter: Payer: Self-pay | Admitting: Student

## 2021-02-25 ENCOUNTER — Ambulatory Visit (INDEPENDENT_AMBULATORY_CARE_PROVIDER_SITE_OTHER): Payer: Medicare Other | Admitting: Student

## 2021-02-25 ENCOUNTER — Other Ambulatory Visit: Payer: Self-pay

## 2021-02-25 VITALS — BP 109/54 | HR 61 | Ht 75.0 in | Wt 173.0 lb

## 2021-02-25 DIAGNOSIS — Z950 Presence of cardiac pacemaker: Secondary | ICD-10-CM

## 2021-02-25 DIAGNOSIS — I2510 Atherosclerotic heart disease of native coronary artery without angina pectoris: Secondary | ICD-10-CM

## 2021-02-25 DIAGNOSIS — I495 Sick sinus syndrome: Secondary | ICD-10-CM

## 2021-02-25 DIAGNOSIS — I4719 Other supraventricular tachycardia: Secondary | ICD-10-CM

## 2021-02-25 DIAGNOSIS — I471 Supraventricular tachycardia: Secondary | ICD-10-CM

## 2021-02-25 DIAGNOSIS — I5022 Chronic systolic (congestive) heart failure: Secondary | ICD-10-CM

## 2021-02-25 LAB — CUP PACEART INCLINIC DEVICE CHECK
Battery Remaining Longevity: 101 mo
Battery Voltage: 3.02 V
Brady Statistic AP VP Percent: 87.76 %
Brady Statistic AP VS Percent: 0.89 %
Brady Statistic AS VP Percent: 6.52 %
Brady Statistic AS VS Percent: 4.84 %
Brady Statistic RA Percent Paced: 89.15 %
Brady Statistic RV Percent Paced: 94.27 %
Date Time Interrogation Session: 20220909120213
Implantable Lead Implant Date: 20060223
Implantable Lead Implant Date: 20060223
Implantable Lead Implant Date: 20220309
Implantable Lead Location: 753858
Implantable Lead Location: 753859
Implantable Lead Location: 753860
Implantable Lead Model: 4598
Implantable Lead Model: 5076
Implantable Lead Model: 5076
Implantable Pulse Generator Implant Date: 20220309
Lead Channel Impedance Value: 380 Ohm
Lead Channel Impedance Value: 380 Ohm
Lead Channel Impedance Value: 418 Ohm
Lead Channel Impedance Value: 418 Ohm
Lead Channel Impedance Value: 437 Ohm
Lead Channel Impedance Value: 475 Ohm
Lead Channel Impedance Value: 532 Ohm
Lead Channel Impedance Value: 532 Ohm
Lead Channel Impedance Value: 646 Ohm
Lead Channel Impedance Value: 646 Ohm
Lead Channel Impedance Value: 665 Ohm
Lead Channel Impedance Value: 798 Ohm
Lead Channel Impedance Value: 836 Ohm
Lead Channel Impedance Value: 912 Ohm
Lead Channel Pacing Threshold Amplitude: 0.625 V
Lead Channel Pacing Threshold Amplitude: 0.75 V
Lead Channel Pacing Threshold Amplitude: 1.625 V
Lead Channel Pacing Threshold Pulse Width: 0.4 ms
Lead Channel Pacing Threshold Pulse Width: 0.4 ms
Lead Channel Pacing Threshold Pulse Width: 0.7 ms
Lead Channel Sensing Intrinsic Amplitude: 2.125 mV
Lead Channel Sensing Intrinsic Amplitude: 3.75 mV
Lead Channel Sensing Intrinsic Amplitude: 5.625 mV
Lead Channel Sensing Intrinsic Amplitude: 6.375 mV
Lead Channel Setting Pacing Amplitude: 1.5 V
Lead Channel Setting Pacing Amplitude: 2.5 V
Lead Channel Setting Pacing Amplitude: 2.5 V
Lead Channel Setting Pacing Pulse Width: 0.4 ms
Lead Channel Setting Pacing Pulse Width: 0.7 ms
Lead Channel Setting Sensing Sensitivity: 1.2 mV

## 2021-02-25 LAB — BASIC METABOLIC PANEL
BUN/Creatinine Ratio: 13 (ref 10–24)
BUN: 14 mg/dL (ref 8–27)
CO2: 25 mmol/L (ref 20–29)
Calcium: 9.8 mg/dL (ref 8.6–10.2)
Chloride: 104 mmol/L (ref 96–106)
Creatinine, Ser: 1.12 mg/dL (ref 0.76–1.27)
Glucose: 68 mg/dL (ref 65–99)
Potassium: 4.7 mmol/L (ref 3.5–5.2)
Sodium: 140 mmol/L (ref 134–144)
eGFR: 65 mL/min/{1.73_m2} (ref >59)

## 2021-02-25 LAB — CBC
Hematocrit: 44.9 % (ref 37.5–51.0)
Hemoglobin: 14.5 g/dL (ref 13.0–17.7)
MCH: 27.7 pg (ref 26.6–33.0)
MCHC: 32.3 g/dL (ref 31.5–35.7)
MCV: 86 fL (ref 79–97)
Platelets: 142 10*3/uL — ABNORMAL LOW (ref 150–450)
RBC: 5.23 x10E6/uL (ref 4.14–5.80)
RDW: 13.9 % (ref 11.6–15.4)
WBC: 7.3 10*3/uL (ref 3.4–10.8)

## 2021-02-25 MED ORDER — BISOPROLOL FUMARATE 5 MG PO TABS: 2.5000 mg | ORAL_TABLET | Freq: Every day | ORAL | 3 refills | Status: DC

## 2021-02-25 NOTE — Patient Instructions (Signed)
Medication Instructions:  Your physician has recommended you make the following change in your medication:   START: Bisoprolol 2.'5mg'$  daily at bedtime  *If you need a refill on your cardiac medications before your next appointment, please call your pharmacy*   Lab Work: TODAY: BMET, CBC  If you have labs (blood work) drawn today and your tests are completely normal, you will receive your results only by: [place] (if you have MyChart) OR A paper copy in the mail If you have any lab test that is abnormal or we need to change your treatment, we will call you to review the results.   Follow-Up: At [facility], you and your health needs are our priority.  As part of our continuing mission to provide you with exceptional heart care, we have created designated Provider Care Teams.  These Care Teams include your primary Cardiologist (physician) and Advanced Practice Providers (APPs -  Physician Assistants and Nurse Practitioners) who all work together to provide you with the care you need, when you need it.  We recommend signing up for the patient portal called "MyChart".  Sign up information is provided on this After Visit Summary.  MyChart is used to connect with patients for Virtual Visits (Telemedicine).  Patients are able to view lab/test results, encounter notes, upcoming appointments, etc.  Non-urgent messages can be sent to your provider as well.   To learn more about what you can do with MyChart, go to [email or web address].    Your next appointment:   06/01/2021

## 2021-03-08 DIAGNOSIS — Z23 Encounter for immunization: Secondary | ICD-10-CM | POA: Diagnosis not present

## 2021-03-14 DIAGNOSIS — Z7901 Long term (current) use of anticoagulants: Secondary | ICD-10-CM | POA: Diagnosis not present

## 2021-03-16 ENCOUNTER — Encounter: Payer: Self-pay | Admitting: Orthopaedic Surgery

## 2021-03-16 ENCOUNTER — Ambulatory Visit (INDEPENDENT_AMBULATORY_CARE_PROVIDER_SITE_OTHER): Payer: Medicare Other | Admitting: Orthopaedic Surgery

## 2021-03-16 ENCOUNTER — Telehealth: Payer: Self-pay

## 2021-03-16 ENCOUNTER — Other Ambulatory Visit: Payer: Self-pay

## 2021-03-16 DIAGNOSIS — I2510 Atherosclerotic heart disease of native coronary artery without angina pectoris: Secondary | ICD-10-CM

## 2021-03-16 DIAGNOSIS — M1711 Unilateral primary osteoarthritis, right knee: Secondary | ICD-10-CM

## 2021-03-16 MED ORDER — LIDOCAINE HCL 1 % IJ SOLN
3.0000 mL | INTRAMUSCULAR | Status: AC | PRN
  Administered 2021-03-16: 3 mL

## 2021-03-16 MED ORDER — METHYLPREDNISOLONE ACETATE 40 MG/ML IJ SUSP
40.0000 mg | INTRAMUSCULAR | Status: AC | PRN
  Administered 2021-03-16: 40 mg via INTRA_ARTICULAR

## 2021-03-16 NOTE — Telephone Encounter (Signed)
Please get auth for repeat right knee gel injection-[facility] pt

## 2021-03-16 NOTE — Progress Notes (Signed)
   Procedure Note  Patient: Steven Macdonald             Date of Birth: [birth year]           MRN: [id number]             Visit Date: 03/16/2021 HPI: Steven Macdonald returns today for follow-up of his right knee.  He is having increased pain in the right knee since August.  He last had an gel injection in the 07/19/2020.  He has had no new injury to the knee.  He has had no recent fevers chills.  No change in medical status.  Asking about repeat cortisone or gel injection today.  Physical exam: Right knee has good range of motion the right knee no abnormal warmth erythema or effusion.   Procedures: Visit Diagnoses:  1. Unilateral primary osteoarthritis, right knee     Large Joint Inj: R knee on 03/16/2021 11:11 AM Indications: pain Details: 22 G 1.5 in needle, anterolateral approach  Arthrogram: No  Medications: 3 mL lidocaine 1 %; 40 mg methylPREDNISolone acetate 40 MG/ML Outcome: tolerated well, no immediate complications Procedure, treatment alternatives, risks and benefits explained, specific risks discussed. Consent was given by the patient. Immediately prior to procedure a time out was called to verify the correct patient, procedure, equipment, support staff and site/side marked as required. Patient was prepped and draped in the usual sterile fashion.     Plan: Did repeat the cortisone injection today we will try to gain approval for supplemental injection as this is giving him great relief in the past.  Have him back once the gel injections available.  Questions were encouraged and answered at length.

## 2021-03-16 NOTE — Telephone Encounter (Signed)
Noted  

## 2021-03-26 DIAGNOSIS — Z23 Encounter for immunization: Secondary | ICD-10-CM | POA: Diagnosis not present

## 2021-04-18 ENCOUNTER — Telehealth: Payer: Self-pay | Admitting: Orthopaedic Surgery

## 2021-04-18 DIAGNOSIS — Z7901 Long term (current) use of anticoagulants: Secondary | ICD-10-CM | POA: Diagnosis not present

## 2021-04-18 NOTE — Telephone Encounter (Signed)
Pt came in to check the status of his gel inj that was ordered. He stated it has been over a month and had not heard anything and wanted to see if there was anything on his side he needed to do. The best call back number is [phone].

## 2021-04-18 NOTE — Telephone Encounter (Signed)
Talked with patient and appointment has been scheduled for gel injection.  

## 2021-04-19 ENCOUNTER — Telehealth: Payer: Self-pay

## 2021-04-19 NOTE — Telephone Encounter (Signed)
Approved for Monovisc, right knee. [place] deductible has been met Secondary plan [employer] will pick up remaining eligible expenses at 100%. No Co-pay No PA required  Appt. 04/25/2021 with Erskine Emery

## 2021-04-25 ENCOUNTER — Encounter: Payer: Self-pay | Admitting: Physician Assistant

## 2021-04-25 ENCOUNTER — Ambulatory Visit (INDEPENDENT_AMBULATORY_CARE_PROVIDER_SITE_OTHER): Payer: Medicare Other | Admitting: Physician Assistant

## 2021-04-25 DIAGNOSIS — M1711 Unilateral primary osteoarthritis, right knee: Secondary | ICD-10-CM

## 2021-04-25 MED ORDER — HYALURONAN 88 MG/4ML IX SOSY
88.0000 mg | PREFILLED_SYRINGE | INTRA_ARTICULAR | Status: AC | PRN
  Administered 2021-04-25: 88 mg via INTRA_ARTICULAR

## 2021-04-25 NOTE — Progress Notes (Signed)
   Procedure Note  Patient: Steven Macdonald             Date of Birth: [birth year]           MRN: [id number]             Visit Date: 04/25/2021 HPI: Steven Macdonald is well-known to Dr. Ninfa Linden service has known osteoarthritis of his right knee.  He has had no known injury.  He is here today for Monovisc injection right knee.  Radiographs of his right knee in the past were shown significant tricompartmental arthritis.  He has no planned knee surgery next 6 months  Physical exam: Right knee good range of motion of the right knee no abnormal warmth erythema or effusion.  Procedures: Visit Diagnoses:  1. Unilateral primary osteoarthritis, right knee     Large Joint Inj: R knee on 04/25/2021 3:01 PM Indications: pain Details: 22 G 1.5 in needle, anterolateral approach  Arthrogram: No  Medications: 88 mg Hyaluronan 88 MG/4ML Outcome: tolerated well, no immediate complications Procedure, treatment alternatives, risks and benefits explained, specific risks discussed. Consent was given by the patient. Immediately prior to procedure a time out was called to verify the correct patient, procedure, equipment, support staff and site/side marked as required. Patient was prepped and draped in the usual sterile fashion.    Plan: We will see him back in 8 weeks to see how he is doing overall.  Questions encouraged and answered

## 2021-05-04 ENCOUNTER — Telehealth: Payer: Self-pay | Admitting: Internal Medicine

## 2021-05-04 DIAGNOSIS — D6869 Other thrombophilia: Secondary | ICD-10-CM | POA: Diagnosis not present

## 2021-05-04 DIAGNOSIS — R7301 Impaired fasting glucose: Secondary | ICD-10-CM | POA: Diagnosis not present

## 2021-05-04 DIAGNOSIS — F329 Major depressive disorder, single episode, unspecified: Secondary | ICD-10-CM | POA: Diagnosis not present

## 2021-05-04 DIAGNOSIS — I480 Paroxysmal atrial fibrillation: Secondary | ICD-10-CM | POA: Diagnosis not present

## 2021-05-04 DIAGNOSIS — I509 Heart failure, unspecified: Secondary | ICD-10-CM | POA: Diagnosis not present

## 2021-05-04 DIAGNOSIS — I130 Hypertensive heart and chronic kidney disease with heart failure and stage 1 through stage 4 chronic kidney disease, or unspecified chronic kidney disease: Secondary | ICD-10-CM | POA: Diagnosis not present

## 2021-05-04 DIAGNOSIS — E785 Hyperlipidemia, unspecified: Secondary | ICD-10-CM | POA: Diagnosis not present

## 2021-05-04 DIAGNOSIS — I2510 Atherosclerotic heart disease of native coronary artery without angina pectoris: Secondary | ICD-10-CM | POA: Diagnosis not present

## 2021-05-04 DIAGNOSIS — Z950 Presence of cardiac pacemaker: Secondary | ICD-10-CM | POA: Diagnosis not present

## 2021-05-04 NOTE — Telephone Encounter (Signed)
New message    Patient walked in stating that his PCP told him to make Dr Caryl Comes aware of his "low grade" chest pain.  He states that it is off and on and has been going on for 2 months.  No SOB or other symptoms. Patient is not having active chest pain at this moment.  Patient states that the chest pain is not connected with exercise or anything else.  It is just random.  Please call and let patient know what Dr Caryl Comes says.

## 2021-05-04 NOTE — Progress Notes (Signed)
Cardiology Office Note:    Date:  05/05/2021   ID:  Steven Macdonald, DOB [birth year], MRN [id number]  PCP:  Crist Infante, MD   [facility] Providers Cardiologist:  None Electrophysiologist:  Virl Axe, MD     Referring MD: Crist Infante, MD   CC:  Acute CP  History of Present Illness:    Steven Macdonald is a 86 y.o. male with a hx of AFL s/p RF ablation, Prior BiV PPM, NICM and recovered EF, HTN and HLD wit DM, Known LBBB, hx of prior PE, Mild TAA who presents and a DOD visit for CP 05/05/21.  In 2019 had CP and known obstructive CAD (pLAD).  He does not seen to be aware of some of these diagnoses.  Seen 05/05/21.  Patient notes that he is doing well- he has som 2/10 chest pain that last occurred 4-5 days ago.  This is not set off by a walk or other activity. He is able to walk around, rakes leaves, and push mows his yard without issues.     He notes that these do not cause him discomfort   There are no interval hospital/ED visit.    No SOB/DOE and no PND/Orthopnea.  No weight gain or leg swelling.  No palpitations or syncope.   Past Medical History:  Diagnosis Date   Arthritis    Atrial flutter ([place])    RFCA    BPH (benign prostatic hypertrophy)    Cardiomyopathy, nonischemic -resolved    Echo 2008: EF 25% Cardiac CT 2009: Normal cors EF 31% Echo 3/10 EF 45% Echo 6/13: EF 50-55%     CHF (congestive heart failure) (HCC)    Chicken pox    Colon polyps    hyperplastic   Diabetes mellitus ([place])    ED (erectile dysfunction)    Gilbert's syndrome    possible   Hemorrhoids    Herniated cervical disc    HLD (hyperlipidemia)    HTN (hypertension)    LBBB (left bundle branch block)    Orthostatic hypotension    Pacemaker  Medtronic    DOI 2/06   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[place])    Pulmonary embolism ([place])    Sinus node dysfunction ([place])         Past Surgical History:  Procedure Laterality Date   BIV UPGRADE N/A 08/25/2020   Procedure: BIV PPM UPGRADE;  Surgeon: Deboraha Sprang, MD;  Location: [place] [facility];  Service: Cardiovascular;  Laterality: N/A;   CARDIOVERSION     COLONOSCOPY WITH PROPOFOL N/A 01/20/2021   Procedure: COLONOSCOPY WITH PROPOFOL;  Surgeon: Irene Shipper, MD;  [facility];  Service: Endoscopy;  Laterality: N/A;   coronary ablation     CYSTOSCOPY W/ TRANSURETHRAL RESECTION OF POSTERIOR URETHERAL VALVES     PACEMAKER GENERATOR CHANGE N/A 05/13/2014   Procedure: PACEMAKER GENERATOR CHANGE;  Surgeon: Deboraha Sprang, MD;  Location: [facility] CATH LAB;  Service: Cardiovascular;  Laterality: N/A;   PACEMAKER INSERTION     POLYPECTOMY  01/20/2021   Procedure: POLYPECTOMY;  Surgeon: Irene Shipper, MD;  Location: [facility] ENDOSCOPY;  Service: Endoscopy;;    Current Medications: Current Meds  Medication Sig   bisoprolol (ZEBETA) 5 MG tablet Take 1 tablet (5 mg total) by mouth daily.   Calcium Carb-Cholecalciferol (CALCIUM 600+D3) 600-800 MG-UNIT TABS Take 1 tablet by mouth daily.   cholecalciferol (VITAMIN D) 1000 UNITS tablet Take 1,000 Units by mouth daily.   Cyanocobalamin (VITAMIN B-12) 2500 MCG SUBL  Place 2,500 mcg under the tongue daily.   finasteride (PROSCAR) 5 MG tablet Take 5 mg by mouth every evening.   mirtazapine (REMERON) 15 MG tablet Take 15 mg by mouth at bedtime.   Omega-3 Fatty Acids (FISH OIL) 1200 MG CAPS Take 2,400 mg by mouth daily.   polyethylene glycol (MIRALAX / GLYCOLAX) 17 g packet Take 17 g by mouth daily as needed (constipation).   PRESCRIPTION MEDICATION Apply 1 application topically See admin instructions. Dermatological intment for wound care - apply to tailbone daily as needed for sores   Probiotic Product (ALIGN PO) Take 4 mg by mouth daily.   PSYLLIUM PO Take 10 mLs by mouth daily as needed (constipation). Mix in 8 oz liquid and drink   sacubitril-valsartan (ENTRESTO) 24-26 MG Take 1 tablet by mouth 2 (two) times daily.   sildenafil (REVATIO) 20 MG tablet Take 100 mg by mouth daily as needed (erectile  dysfunction).   simvastatin (ZOCOR) 40 MG tablet Take 20 mg by mouth 2 (two) times daily.   tamsulosin (FLOMAX) 0.4 MG CAPS capsule Take 0.4 mg by mouth every morning.    warfarin (COUMADIN) 5 MG tablet Take 5-7.5 mg by mouth See admin instructions. Take 1 tablet (5 mg) by mouth on Sundays, Tuesdays, Thursdays & Saturdays. Take 1.5 tablets (7.5 mg) by mouth on Mondays, Wednesdays, & Fridays.   [DISCONTINUED] bisoprolol (ZEBETA) 5 MG tablet Take 0.5 tablets (2.5 mg total) by mouth at bedtime.   [DISCONTINUED] Sodium Sulfate-Mag Sulfate-KCl (SUTAB) [phone] MG TABS Use as directed for colonoscopy. MANUFACTURER CODES!! BIN: [id number] PCN: CN GROUP: [id number] MEMBER ID: [id number];[employer] AS SECONDARY INSURANCE ;NO PRIOR AUTHORIZATION     Allergies:   Patient has no known allergies.   Social History   Socioeconomic History   Marital status: Married    Spouse name: Not on file   Number of children: 4   Years of education: Not on file   Highest education level: Not on file  Occupational History   Occupation: Insurance underwriter: SELF-EMPLOYED  Tobacco Use   Smoking status: Former    Types: Cigarettes    Quit date: 10/17/1970    Years since quitting: 50.5   Smokeless tobacco: Never  Vaping Use   Vaping Use: Never used  Substance and Sexual Activity   Alcohol use: Yes    Comment: Drink every day   Drug use: No   Sexual activity: Not on file  Other Topics Concern   Not on file  Social History Narrative   Not on file   Social Determinants of Health   Financial Resource Strain: Not on file  Food Insecurity: Not on file  Transportation Needs: Not on file  Physical Activity: Not on file  Stress: Not on file  Social Connections: Not on file     Family History: The patient's family history includes Bladder Cancer in his father; Breast cancer in his mother; Colon polyps in his father.  ROS:   Please see the history of present illness.     All other systems reviewed and are  negative.  EKGs/Labs/Other Studies Reviewed:    The following studies were reviewed today:   EKG:  EKG is  ordered today.  The ekg ordered today demonstrates  05/05/21: Suspect SR (regular rhythm) low voltage P waves morphology consistent with Biv Pace  Cardiac CT Date:10/24/17 Results: IMPRESSION: 1. Coronary artery calcium score 20 Agatston units.  2. The proximal LAD appears subtotally occluded soft plaque and confirmed with FFR.  Transthoracic Echocardiogram: Date: 02/09/21 Results:  1. Moderate hypertrophy of the basal septum with otherwise mild  concentric LVH. Global hypokinesis worse in the septum. Compared with the  echo 0/1751, systolic function has improved. Left ventricular ejection  fraction, by estimation, is 30 to 35%. The  left ventricle has moderately decreased function. The left ventricle  demonstrates global hypokinesis. There is moderate left ventricular  hypertrophy. Left ventricular diastolic parameters are consistent with  Grade I diastolic dysfunction (impaired  relaxation).   2. Right ventricular systolic function is normal. The right ventricular  size is normal.   3. The mitral valve is normal in structure. Trivial mitral valve  regurgitation. No evidence of mitral stenosis.   4. The aortic valve is normal in structure. Aortic valve regurgitation is  mild. No aortic stenosis is present.   5. Aortic dilatation noted. There is mild dilatation of the aortic root,  measuring 39 mm. There is mild dilatation of the ascending aorta,  measuring 42 mm.   6. The inferior vena cava is normal in size with greater than 50%  respiratory variability, suggesting right atrial pressure of 3 mmHg.    Recent Labs: 02/25/2021: BUN 14; Creatinine, Ser 1.12; Hemoglobin 14.5; Platelets 142; Potassium 4.7; Sodium 140  Recent Lipid Panel No results found for: CHOL, TRIG, HDL, CHOLHDL, VLDL, LDLCALC, LDLDIRECT   Physical Exam:    VS:  BP 110/60   Pulse 62   Ht 6\' 3"   (1.905 m)   Wt 176 lb (79.8 kg)   SpO2 96%   BMI 22.00 kg/m     Wt Readings from Last 3 Encounters:  05/05/21 176 lb (79.8 kg)  02/25/21 173 lb (78.5 kg)  01/20/21 170 lb (77.1 kg)     Gen: No distress, Tall, elderly male   Neck:  JVD to the lower third of neck, no carotid bruit Cardiac: No Rubs or Gallops, holosystolic Murmur, regular rate +2 radial pulses Respiratory: Clear to auscultation bilaterally, normal effort, normal  respiratory rate GI: Soft, nontender, non-distended  MS: trace bilateral pitting edema;  moves all extremities Integument: Skin feels warm Neuro:  At time of evaluation, alert and oriented to person/place/time/situation  Psych: Normal affect, patient feels fell   ASSESSMENT:    1. Coronary artery disease involving native coronary artery of native heart without angina pectoris   2. Chronic systolic CHF (congestive heart failure) ([place])   3. LBBB (left bundle branch block)   4. Biventricular cardiac pacemaker in situ    PLAN:    CAD with subtotal LAD occlusion HFrEF Mild TAA SSS s/p BiV PPM and hx of LBBB AFL s/p ablation per chart review - today, patient is asymptomatic  - continue warfarin AC and statin - we have discussed a tria of 5 mg bisoprolol (was on 2.5) we discussed mortality benefit of GDMT, treatment in CAD, and risks of hypotension; recommended amb BP monitoring and if he were to feel worse he should come down to 2.5 mg dose - continue entresto - NYHA class I sx, does not think he has extra fluid on him, we will get BNP and BMP - has 12/22 f/u with Dr. Caryl Comes.  - reviewed Cardiac CT with patient and showed TAA and prox LAD occlusion  Time Spent Directly with Patient:   I have spent a total of 40 minutes with the patient reviewing notes, imaging, EKGs, labs and examining the patient as well as establishing an assessment and plan that was discussed personally with the patient.  >  50% of time was spent in direct patient care and reviewing  imaging with patient .            Medication Adjustments/Labs and Tests Ordered: Current medicines are reviewed at length with the patient today.  Concerns regarding medicines are outlined above.  Orders Placed This Encounter  Procedures   Basic metabolic panel   Pro b natriuretic peptide (BNP)   EKG 12-Lead   Meds ordered this encounter  Medications   bisoprolol (ZEBETA) 5 MG tablet    Sig: Take 1 tablet (5 mg total) by mouth daily.    Dispense:  90 tablet    Refill:  3    Patient Instructions  Medication Instructions:  Your physician has recommended you make the following change in your medication:  INCREASE: bisoprolol to 5 mg by mouth daily-- monitor BP if it is low consistently go back to taking 2.5 mg   *If you need a refill on your cardiac medications before your next appointment, please call your pharmacy*   Lab Work: TODAY: BMP, BNP If you have labs (blood work) drawn today and your tests are completely normal, you will receive your results only by: [place] (if you have MyChart) OR A paper copy in the mail If you have any lab test that is abnormal or we need to change your treatment, we will call you to review the results.   Testing/Procedures: NONE   Follow-Up: As scheduled  At [facility], you and your health needs are our priority.  As part of our continuing mission to provide you with exceptional heart care, we have created designated Provider Care Teams.  These Care Teams include your primary Cardiologist (physician) and Advanced Practice Providers (APPs -  Physician Assistants and Nurse Practitioners) who all work together to provide you with the care you need, when you need it.  We recommend signing up for the patient portal called "MyChart".  Sign up information is provided on this After Visit Summary.  MyChart is used to connect with patients for Virtual Visits (Telemedicine).  Patients are able to view lab/test results, encounter notes,  upcoming appointments, etc.  Non-urgent messages can be sent to your provider as well.   To learn more about what you can do with MyChart, go to [email or web address].      The format for your next appointment:   In Person  Provider:   Virl Axe :1}      Signed, Werner Lean, MD  05/05/2021 11:31 AM    [place]

## 2021-05-04 NOTE — Telephone Encounter (Signed)
Spoke with pt who complains of mild intermittent CP x 2 months.  Pt has seen his PCP who requested pt make Dr Caryl Comes aware.  Pt denies current CP, SOB or dizziness.  Most recent CT - Aorta 07/22 results ordered by Dr Joylene Draft IMPRESSION: 1. Stable uncomplicated mild fusiform aneurysmal dilatation of the ascending thoracic aorta measuring 42 mm diameter, grossly unchanged compared to the 07/2013 examination. Aortic aneurysm NOS (ICD10-I71.9). 2. Cardiomegaly, now with 3 lead pacemaker. 3.  Aortic Atherosclerosis (ICD10-I70.0). Appointment scheduled with DOD, Dr Whitney Post, 05/05/2021 for further evaluation as Dr Caryl Comes is out of the office until the end of the month.  Reviewed ED precautions.  Pt verbalizes understanding and agrees with current plan.

## 2021-05-05 ENCOUNTER — Other Ambulatory Visit: Payer: Self-pay

## 2021-05-05 ENCOUNTER — Ambulatory Visit (INDEPENDENT_AMBULATORY_CARE_PROVIDER_SITE_OTHER): Payer: Medicare Other | Admitting: Internal Medicine

## 2021-05-05 ENCOUNTER — Encounter: Payer: Self-pay | Admitting: Internal Medicine

## 2021-05-05 VITALS — BP 110/60 | HR 62 | Ht 75.0 in | Wt 176.0 lb

## 2021-05-05 DIAGNOSIS — Z950 Presence of cardiac pacemaker: Secondary | ICD-10-CM | POA: Diagnosis not present

## 2021-05-05 DIAGNOSIS — I5022 Chronic systolic (congestive) heart failure: Secondary | ICD-10-CM | POA: Diagnosis not present

## 2021-05-05 DIAGNOSIS — I25119 Atherosclerotic heart disease of native coronary artery with unspecified angina pectoris: Secondary | ICD-10-CM | POA: Insufficient documentation

## 2021-05-05 DIAGNOSIS — I2510 Atherosclerotic heart disease of native coronary artery without angina pectoris: Secondary | ICD-10-CM | POA: Diagnosis not present

## 2021-05-05 DIAGNOSIS — I447 Left bundle-branch block, unspecified: Secondary | ICD-10-CM

## 2021-05-05 MED ORDER — BISOPROLOL FUMARATE 5 MG PO TABS: 5.0000 mg | ORAL_TABLET | Freq: Every day | ORAL | 3 refills | Status: DC

## 2021-05-05 NOTE — Patient Instructions (Signed)
Medication Instructions:  Your physician has recommended you make the following change in your medication:  INCREASE: bisoprolol to 5 mg by mouth daily-- monitor BP if it is low consistently go back to taking 2.5 mg   *If you need a refill on your cardiac medications before your next appointment, please call your pharmacy*   Lab Work: TODAY: BMP, BNP If you have labs (blood work) drawn today and your tests are completely normal, you will receive your results only by: [place] (if you have MyChart) OR A paper copy in the mail If you have any lab test that is abnormal or we need to change your treatment, we will call you to review the results.   Testing/Procedures: NONE   Follow-Up: As scheduled  At [facility], you and your health needs are our priority.  As part of our continuing mission to provide you with exceptional heart care, we have created designated Provider Care Teams.  These Care Teams include your primary Cardiologist (physician) and Advanced Practice Providers (APPs -  Physician Assistants and Nurse Practitioners) who all work together to provide you with the care you need, when you need it.  We recommend signing up for the patient portal called "MyChart".  Sign up information is provided on this After Visit Summary.  MyChart is used to connect with patients for Virtual Visits (Telemedicine).  Patients are able to view lab/test results, encounter notes, upcoming appointments, etc.  Non-urgent messages can be sent to your provider as well.   To learn more about what you can do with MyChart, go to [email or web address].      The format for your next appointment:   In Person  Provider:   Virl Axe :1}

## 2021-05-06 LAB — BASIC METABOLIC PANEL
BUN/Creatinine Ratio: 12 (ref 10–24)
BUN: 14 mg/dL (ref 8–27)
CO2: 26 mmol/L (ref 20–29)
Calcium: 9.9 mg/dL (ref 8.6–10.2)
Chloride: 103 mmol/L (ref 96–106)
Creatinine, Ser: 1.14 mg/dL (ref 0.76–1.27)
Glucose: 91 mg/dL (ref 70–99)
Potassium: 4.6 mmol/L (ref 3.5–5.2)
Sodium: 140 mmol/L (ref 134–144)
eGFR: 63 mL/min/{1.73_m2} (ref >59)

## 2021-05-06 LAB — PRO B NATRIURETIC PEPTIDE: NT-Pro BNP: 124 pg/mL (ref 0–486)

## 2021-05-23 DIAGNOSIS — Z7901 Long term (current) use of anticoagulants: Secondary | ICD-10-CM | POA: Diagnosis not present

## 2021-05-30 DIAGNOSIS — Z87891 Personal history of nicotine dependence: Secondary | ICD-10-CM | POA: Diagnosis not present

## 2021-05-30 DIAGNOSIS — H6122 Impacted cerumen, left ear: Secondary | ICD-10-CM | POA: Diagnosis not present

## 2021-05-30 DIAGNOSIS — J3489 Other specified disorders of nose and nasal sinuses: Secondary | ICD-10-CM | POA: Diagnosis not present

## 2021-05-30 DIAGNOSIS — J342 Deviated nasal septum: Secondary | ICD-10-CM | POA: Diagnosis not present

## 2021-06-01 ENCOUNTER — Encounter: Payer: Self-pay | Admitting: Internal Medicine

## 2021-06-01 ENCOUNTER — Ambulatory Visit (INDEPENDENT_AMBULATORY_CARE_PROVIDER_SITE_OTHER): Payer: Medicare Other | Admitting: Internal Medicine

## 2021-06-01 ENCOUNTER — Other Ambulatory Visit: Payer: Self-pay

## 2021-06-01 VITALS — BP 122/74 | HR 61 | Ht 74.5 in | Wt 175.0 lb

## 2021-06-01 DIAGNOSIS — I5022 Chronic systolic (congestive) heart failure: Secondary | ICD-10-CM

## 2021-06-01 DIAGNOSIS — I447 Left bundle-branch block, unspecified: Secondary | ICD-10-CM | POA: Diagnosis not present

## 2021-06-01 DIAGNOSIS — I495 Sick sinus syndrome: Secondary | ICD-10-CM | POA: Diagnosis not present

## 2021-06-01 DIAGNOSIS — Z950 Presence of cardiac pacemaker: Secondary | ICD-10-CM | POA: Diagnosis not present

## 2021-06-01 DIAGNOSIS — I2510 Atherosclerotic heart disease of native coronary artery without angina pectoris: Secondary | ICD-10-CM | POA: Diagnosis not present

## 2021-06-01 NOTE — Patient Instructions (Signed)
Medication Instructions:  Your physician recommends that you continue on your current medications as directed. Please refer to the Current Medication list given to you today.  *If you need a refill on your cardiac medications before your next appointment, please call your pharmacy*   Lab Work: None ordered.  If you have labs (blood work) drawn today and your tests are completely normal, you will receive your results only by: [place] (if you have MyChart) OR A paper copy in the mail If you have any lab test that is abnormal or we need to change your treatment, we will call you to review the results.   Testing/Procedures: None ordered.    Follow-Up: At [facility], you and your health needs are our priority.  As part of our continuing mission to provide you with exceptional heart care, we have created designated Provider Care Teams.  These Care Teams include your primary Cardiologist (physician) and Advanced Practice Providers (APPs -  Physician Assistants and Nurse Practitioners) who all work together to provide you with the care you need, when you need it.  We recommend signing up for the patient portal called "MyChart".  Sign up information is provided on this After Visit Summary.  MyChart is used to connect with patients for Virtual Visits (Telemedicine).  Patients are able to view lab/test results, encounter notes, upcoming appointments, etc.  Non-urgent messages can be sent to your provider as well.   To learn more about what you can do with MyChart, go to [email or web address].    Your next appointment:   9 months with Dr Caryl Comes

## 2021-06-01 NOTE — Progress Notes (Signed)
Patient Care Team: Crist Infante, MD as PCP - General Deboraha Sprang, MD as PCP - Electrophysiology (Cardiology)   HPI  Steven Macdonald is a 86 y.o. male Seen in followup for AFlutter s/p RFCA He has had no recurrent arrhythmia.  He had a previously implanted dual-chamber Medtronic pacemaker 2006  for sinus node dysfunction; Generator replacement 11/15 and CRT upgrade 3/21 because of progressive exercise intolerance with only modest post implantation improvement;  6/22 electrocardiographic guided reprogramming, lengthening AV delay and adding LV offset of +40 to shorten QRS duration.  Discontinued the propafenone with some trepidation given his hypotension and modest LV dysfunction  History of syncope (11/20) triggered by atrial arrhythmias and he was treated with propafenone for rhythm control; this was discontinued because of persistent LV dysfunction, albeit with some trepidation.  Delene Loll was added  Cardiac CT demonstrated LAD occlusion (per the notes from Dr. Jennette Bill) by whom he was seen who started him on low-dose bisoprolol    He has a history of pulmonary embolism and is on Coumadin.      He admits that when driving, he drives with stiff arms and this is associated with chest pain.  Relieved by getting up and moving around.  Otherwise without chest pain.  He denies light headedness, and admits to fatigue with walking but specifically denies shortness of breath.  He walks for exercise.  He is compliant with all medications with no notable side effects.  Date Cr K Hgb  10/18   14.5   11/20 0.99 4.0 14.2  3/22 1.13 4.6 15.1     DATE TEST EF    10/09 CTA   Ca score 0  12/13 Echo   25 %    1/15 Myoview   Multiple perfusion defects   1/15 Echo   45-50 %    10/16 Echo  45-50%    11/20 Echo  40-45%    6/21 CTA   4.1cm Aortic root  01/22 Echo 20-25%    8/22 Echo  40-45%          Past Medical History:  Diagnosis Date   Arthritis    Atrial flutter ([place])     RFCA    BPH (benign prostatic hypertrophy)    Cardiomyopathy, nonischemic -resolved    Echo 2008: EF 25% Cardiac CT 2009: Normal cors EF 31% Echo 3/10 EF 45% Echo 6/13: EF 50-55%     CHF (congestive heart failure) (HCC)    Chicken pox    Colon polyps    hyperplastic   Diabetes mellitus ([place])    ED (erectile dysfunction)    Gilbert's syndrome    possible   Hemorrhoids    Herniated cervical disc    HLD (hyperlipidemia)    HTN (hypertension)    LBBB (left bundle branch block)    Orthostatic hypotension    Pacemaker  Medtronic    DOI 2/06   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[place])    Pulmonary embolism ([place])    Sinus node dysfunction ([place])         Past Surgical History:  Procedure Laterality Date   BIV UPGRADE N/A 08/25/2020   Procedure: BIV PPM UPGRADE;  Surgeon: Deboraha Sprang, MD;  Location: [place] CV LAB;  Service: Cardiovascular;  Laterality: N/A;   CARDIOVERSION     COLONOSCOPY WITH PROPOFOL N/A 01/20/2021   Procedure: COLONOSCOPY WITH PROPOFOL;  Surgeon: Irene Shipper, MD;  [facility];  Service: Endoscopy;  Laterality: N/A;  coronary ablation     CYSTOSCOPY W/ TRANSURETHRAL RESECTION OF POSTERIOR URETHERAL VALVES     PACEMAKER GENERATOR CHANGE N/A 05/13/2014   Procedure: PACEMAKER GENERATOR CHANGE;  Surgeon: Deboraha Sprang, MD;  Location: [facility] CATH LAB;  Service: Cardiovascular;  Laterality: N/A;   PACEMAKER INSERTION     POLYPECTOMY  01/20/2021   Procedure: POLYPECTOMY;  Surgeon: Irene Shipper, MD;  [facility];  Service: Endoscopy;;    Current Outpatient Medications  Medication Sig Dispense Refill   bisoprolol (ZEBETA) 5 MG tablet Take 1 tablet (5 mg total) by mouth daily. 90 tablet 3   Calcium Carb-Cholecalciferol (CALCIUM 600+D3) 600-800 MG-UNIT TABS Take 1 tablet by mouth daily.     cholecalciferol (VITAMIN D) 1000 UNITS tablet Take 1,000 Units by mouth daily.     Cyanocobalamin (VITAMIN B-12) 2500 MCG SUBL Place 2,500 mcg under the tongue daily.      finasteride (PROSCAR) 5 MG tablet Take 5 mg by mouth every evening.     mirtazapine (REMERON) 15 MG tablet Take 15 mg by mouth at bedtime.     Omega-3 Fatty Acids (FISH OIL) 1200 MG CAPS Take 2,400 mg by mouth daily.     polyethylene glycol (MIRALAX / GLYCOLAX) 17 g packet Take 17 g by mouth daily as needed (constipation).     PRESCRIPTION MEDICATION Apply 1 application topically See admin instructions. Dermatological intment for wound care - apply to tailbone daily as needed for sores     Probiotic Product (ALIGN PO) Take 4 mg by mouth daily.     PSYLLIUM PO Take 10 mLs by mouth daily as needed (constipation). Mix in 8 oz liquid and drink     sacubitril-valsartan (ENTRESTO) 24-26 MG Take 1 tablet by mouth 2 (two) times daily. 180 tablet 1   sildenafil (REVATIO) 20 MG tablet Take 100 mg by mouth daily as needed (erectile dysfunction).     simvastatin (ZOCOR) 40 MG tablet Take 20 mg by mouth 2 (two) times daily.     tamsulosin (FLOMAX) 0.4 MG CAPS capsule Take 0.4 mg by mouth every morning.      warfarin (COUMADIN) 5 MG tablet Take 5-7.5 mg by mouth See admin instructions. Take 1 tablet (5 mg) by mouth on Sundays, Tuesdays, Thursdays & Saturdays. Take 1.5 tablets (7.5 mg) by mouth on Mondays, Wednesdays, & Fridays.     No current facility-administered medications for this visit.    No Known Allergies  Review of Systems negative except from HPI and PMH  Physical Exam BP 122/74    Pulse 61    Ht 6' 2.5" (1.892 m)    Wt 175 lb (79.4 kg)    SpO2 99%    BMI 22.17 kg/m  Well developed and well nourished in no acute distress HENT normal Neck supple with JVP-flat Clear Device pocket well healed; without hematoma or erythema.  There is no tethering  Regular rate and rhythm, 2/6 murmur Abd-soft with active BS No Clubbing cyanosis  edema Skin-warm and dry A & Oriented  Grossly normal sensory and motor function  ECG sinus with P synchronous pacing with intermittent loss of left ventricular  capture as manifested by loss of a monophasic R wave in lead V1  Assessment and  Plan  Sinus node dysfunction  Dypsnea on exertion  Pacemaker-Medtronic .   Nonischemic cardiomyopathy-resolved  CHF chronic systolic  LBBB   Pulmonary Embolism  Chronic therapy  Depression  Increasing ventricular pacing  Syncope  2/2 atrial tachycardia >> propafenone >>  stopped  Elevated LV threshold  Dyspnea is much improved.  Fatigue as well.  This may correlate with interval improvement in his LV function from the low 20s to the low 40s.  We will continue the [facility] 24/26 and the bisoprolol now at 5 daily  LV threshold was elevated; vector express suggest that the 1-2 configuration is better than 1-3.  And so the device was reprogrammed   we discussed potential benefit of the SGLT2, he is disinclined  No interval atrial arrhythmias.  Continue anticoagulation with warfarin  In his estimation his depression is significantly better not withstanding the worsening of his physical lassitude.   I,Jordan Kelly,acting as a Education administrator for Virl Axe, MD.,have documented all relevant documentation on the behalf of Virl Axe, MD,as directed by  Virl Axe, MD while in the presence of Virl Axe, MD. I, Virl Axe, MD, have reviewed all documentation for this visit. The documentation on 06/01/21 for the exam, diagnosis, procedures, and orders are all accurate and complete.

## 2021-06-01 NOTE — Progress Notes (Signed)
Patient Care Team: Crist Infante, MD as PCP - General Deboraha Sprang, MD as PCP - Electrophysiology (Cardiology)   HPI  Steven Macdonald is a 86 y.o. male Seen in followup for AFlutter s/p RFCA He has had no recurrent arrhythmia.  He had a previously implanted dual-chamber Medtronic pacemaker 2006  for sinus node dysfunction; Generator replacement 11/15 and CRT upgrade 3/21 because of progressive exercise intolerance with only modest post implantation improvement;  6/22 electrocardiographic guided reprogramming, lengthening AV delay and adding LV offset of +40 to shorten QRS duration.  Discontinued the propafenone with some trepidation given his hypotension and modest LV dysfunction  History of syncope (11/20) triggered by atrial arrhythmias and he was treated with propafenone for rhythm control; this was discontinued because of persistent LV dysfunction, albeit with some trepidation.  Delene Loll was added  Cardiac CT demonstrated LAD occlusion (per the notes from Dr. Jennette Bill) by whom he was seen who started him on low-dose bisoprolol he has done much better with this with less shortness of breath       He has a history of pulmonary embolism and is on Coumadin.     Date Cr K Hgb  10/18   14.5   11/20 0.99 4.0 14.2  3/22 1.13 4.6 15.1     DATE TEST EF    10/09 CTA   Ca score 0  12/13 Echo   25 %    1/15 Myoview   Multiple perfusion defects   1/15 Echo   45-50 %    10/16 Echo  45-50%    11/20 Echo  40-45%    6/21 CTA   4.1cm Aortic root  01/22 Echo 20-25%    8/22 Echo  40-45%          Past Medical History:  Diagnosis Date   Arthritis    Atrial flutter ([place])    RFCA    BPH (benign prostatic hypertrophy)    Cardiomyopathy, nonischemic -resolved    Echo 2008: EF 25% Cardiac CT 2009: Normal cors EF 31% Echo 3/10 EF 45% Echo 6/13: EF 50-55%     CHF (congestive heart failure) (HCC)    Chicken pox    Colon polyps    hyperplastic   Diabetes mellitus ([place])    ED  (erectile dysfunction)    Gilbert's syndrome    possible   Hemorrhoids    Herniated cervical disc    HLD (hyperlipidemia)    HTN (hypertension)    LBBB (left bundle branch block)    Orthostatic hypotension    Pacemaker  Medtronic    DOI 2/06   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[place])    Pulmonary embolism ([place])    Sinus node dysfunction ([place])         Past Surgical History:  Procedure Laterality Date   BIV UPGRADE N/A 08/25/2020   Procedure: BIV PPM UPGRADE;  Surgeon: Deboraha Sprang, MD;  Location: [facility];  Service: Cardiovascular;  Laterality: N/A;   CARDIOVERSION     COLONOSCOPY WITH PROPOFOL N/A 01/20/2021   Procedure: COLONOSCOPY WITH PROPOFOL;  Surgeon: Irene Shipper, MD;  Location: [facility];  Service: Endoscopy;  Laterality: N/A;   coronary ablation     CYSTOSCOPY W/ TRANSURETHRAL RESECTION OF POSTERIOR URETHERAL VALVES     PACEMAKER GENERATOR CHANGE N/A 05/13/2014   Procedure: PACEMAKER GENERATOR CHANGE;  Surgeon: Deboraha Sprang, MD;  Location: [facility];  Service: Cardiovascular;  Laterality: N/A;   PACEMAKER INSERTION  POLYPECTOMY  01/20/2021   Procedure: POLYPECTOMY;  Surgeon: Irene Shipper, MD;  Location: [facility];  Service: Endoscopy;;    Current Outpatient Medications  Medication Sig Dispense Refill   bisoprolol (ZEBETA) 5 MG tablet Take 1 tablet (5 mg total) by mouth daily. 90 tablet 3   Calcium Carb-Cholecalciferol (CALCIUM 600+D3) 600-800 MG-UNIT TABS Take 1 tablet by mouth daily.     cholecalciferol (VITAMIN D) 1000 UNITS tablet Take 1,000 Units by mouth daily.     Cyanocobalamin (VITAMIN B-12) 2500 MCG SUBL Place 2,500 mcg under the tongue daily.     finasteride (PROSCAR) 5 MG tablet Take 5 mg by mouth every evening.     mirtazapine (REMERON) 15 MG tablet Take 15 mg by mouth at bedtime.     Omega-3 Fatty Acids (FISH OIL) 1200 MG CAPS Take 2,400 mg by mouth daily.     polyethylene glycol (MIRALAX / GLYCOLAX) 17 g packet Take 17 g by mouth daily as  needed (constipation).     PRESCRIPTION MEDICATION Apply 1 application topically See admin instructions. Dermatological intment for wound care - apply to tailbone daily as needed for sores     Probiotic Product (ALIGN PO) Take 4 mg by mouth daily.     PSYLLIUM PO Take 10 mLs by mouth daily as needed (constipation). Mix in 8 oz liquid and drink     sacubitril-valsartan (ENTRESTO) 24-26 MG Take 1 tablet by mouth 2 (two) times daily. 180 tablet 1   sildenafil (REVATIO) 20 MG tablet Take 100 mg by mouth daily as needed (erectile dysfunction).     simvastatin (ZOCOR) 40 MG tablet Take 20 mg by mouth 2 (two) times daily.     tamsulosin (FLOMAX) 0.4 MG CAPS capsule Take 0.4 mg by mouth every morning.      warfarin (COUMADIN) 5 MG tablet Take 5-7.5 mg by mouth See admin instructions. Take 1 tablet (5 mg) by mouth on Sundays, Tuesdays, Thursdays & Saturdays. Take 1.5 tablets (7.5 mg) by mouth on Mondays, Wednesdays, & Fridays.     No current facility-administered medications for this visit.    No Known Allergies  Review of Systems negative except from HPI and PMH  Physical Exam BP 122/74    Pulse 61    Ht 6' 2.5" (1.892 m)    Wt 175 lb (79.4 kg)    SpO2 99%    BMI 22.17 kg/m  Well developed and well nourished in no acute distress HENT normal Neck supple with JVP-6-8 Clear Device pocket well healed; without hematoma or erythema.  There is no tethering  Regular rate and rhythm, no  gallop 2/6 apical murmur; sustained modestly displaced PMI Abd-soft with active BS No Clubbing cyanosis no edema Skin-warm and dry A & Oriented  Grossly normal sensory and motor function  ECG sinus with P synchronous pacing  Assessment and  Plan  Sinus node dysfunction  Dypsnea on exertion  Pacemaker-Medtronic .The patient's device was interrogated.  The information was reviewed. No changes were made in the programming.     Nonischemic cardiomyopathy-resolved  CHF chronic systolic  LBBB   Pulmonary  Embolism  Chronic therapy  Depression  Increasing ventricular pacing  Syncope  2/2 atrial tachycardia >> propafenone   Increasing fatigue and lassitude.  Differential diagnosis is broad.  Metabolic parameters, specifically TSH and hemoglobin were normal 4/21 and the symptoms go back about 18 months although they are worsening.  He has concurrent with this increasing left ventricular pacing percentages.  Not quite sure  the reason for that.  The possibility is that it could be aggravating his form of cardiomyopathy.  We will check an echocardiogram.  Calcium score just a couple years ago was 0.  Unlikely that this is ischemia  Without dyspnea, it is hard to implicate recurrent pulmonary embolism.  In his estimation his depression is significantly better not withstanding the worsening of his physical lassitude.

## 2021-06-06 ENCOUNTER — Ambulatory Visit (INDEPENDENT_AMBULATORY_CARE_PROVIDER_SITE_OTHER): Payer: Medicare Other

## 2021-06-06 DIAGNOSIS — I428 Other cardiomyopathies: Secondary | ICD-10-CM

## 2021-06-07 LAB — CUP PACEART REMOTE DEVICE CHECK
Battery Remaining Longevity: 92 mo
Battery Voltage: 3 V
Brady Statistic AP VP Percent: 98.02 %
Brady Statistic AP VS Percent: 0.23 %
Brady Statistic AS VP Percent: 0.91 %
Brady Statistic AS VS Percent: 0.84 %
Brady Statistic RA Percent Paced: 98.57 %
Brady Statistic RV Percent Paced: 98.93 %
Date Time Interrogation Session: 20221218090820
Implantable Lead Implant Date: 20060223
Implantable Lead Implant Date: 20060223
Implantable Lead Implant Date: 20220309
Implantable Lead Location: 753858
Implantable Lead Location: 753859
Implantable Lead Location: 753860
Implantable Lead Model: 4598
Implantable Lead Model: 5076
Implantable Lead Model: 5076
Implantable Pulse Generator Implant Date: 20220309
Lead Channel Impedance Value: 342 Ohm
Lead Channel Impedance Value: 380 Ohm
Lead Channel Impedance Value: 380 Ohm
Lead Channel Impedance Value: 418 Ohm
Lead Channel Impedance Value: 456 Ohm
Lead Channel Impedance Value: 532 Ohm
Lead Channel Impedance Value: 532 Ohm
Lead Channel Impedance Value: 608 Ohm
Lead Channel Impedance Value: 627 Ohm
Lead Channel Impedance Value: 665 Ohm
Lead Channel Impedance Value: 703 Ohm
Lead Channel Impedance Value: 950 Ohm
Lead Channel Impedance Value: 969 Ohm
Lead Channel Impedance Value: 988 Ohm
Lead Channel Pacing Threshold Amplitude: 0.625 V
Lead Channel Pacing Threshold Amplitude: 0.625 V
Lead Channel Pacing Threshold Amplitude: 1.75 V
Lead Channel Pacing Threshold Pulse Width: 0.4 ms
Lead Channel Pacing Threshold Pulse Width: 0.4 ms
Lead Channel Pacing Threshold Pulse Width: 1.2 ms
Lead Channel Sensing Intrinsic Amplitude: 10.125 mV
Lead Channel Sensing Intrinsic Amplitude: 10.125 mV
Lead Channel Sensing Intrinsic Amplitude: 3 mV
Lead Channel Sensing Intrinsic Amplitude: 3 mV
Lead Channel Setting Pacing Amplitude: 1.5 V
Lead Channel Setting Pacing Amplitude: 2.5 V
Lead Channel Setting Pacing Amplitude: 2.5 V
Lead Channel Setting Pacing Pulse Width: 0.4 ms
Lead Channel Setting Pacing Pulse Width: 1.2 ms
Lead Channel Setting Sensing Sensitivity: 1.2 mV

## 2021-06-16 NOTE — Progress Notes (Signed)
Remote pacemaker transmission.   

## 2021-06-21 ENCOUNTER — Ambulatory Visit: Payer: Medicare Other | Admitting: Orthopaedic Surgery

## 2021-06-23 DIAGNOSIS — L720 Epidermal cyst: Secondary | ICD-10-CM | POA: Diagnosis not present

## 2021-06-23 DIAGNOSIS — Z85828 Personal history of other malignant neoplasm of skin: Secondary | ICD-10-CM | POA: Diagnosis not present

## 2021-06-23 DIAGNOSIS — L308 Other specified dermatitis: Secondary | ICD-10-CM | POA: Diagnosis not present

## 2021-06-27 ENCOUNTER — Other Ambulatory Visit: Payer: Self-pay

## 2021-06-27 ENCOUNTER — Encounter: Payer: Self-pay | Admitting: Physician Assistant

## 2021-06-27 ENCOUNTER — Ambulatory Visit (INDEPENDENT_AMBULATORY_CARE_PROVIDER_SITE_OTHER): Payer: Medicare Other | Admitting: Physician Assistant

## 2021-06-27 DIAGNOSIS — M1711 Unilateral primary osteoarthritis, right knee: Secondary | ICD-10-CM

## 2021-06-27 DIAGNOSIS — Z7901 Long term (current) use of anticoagulants: Secondary | ICD-10-CM | POA: Diagnosis not present

## 2021-06-27 NOTE — Progress Notes (Signed)
° °  HPI: Steven Macdonald returns today 8 weeks status post Monovisc injection right knee.  He has known osteoarthritis right knee.  States overall the injection helped about 50%.  He has had no new injuries or falls.  He is noted no swelling knees no mechanical symptoms.  He states his pain in the knee is 4 out of 10 pain worse with prolonged activities and certain movements.  Overall otherwise doing well.  Review of systems: See HPI otherwise negative or noncontributory  Physical exam: General well-developed well-nourished male no acute distress.  Ambulates without any assistive device. Right knee: Full range of motion of the right knee.  No instability valgus varus stressing.  No abnormal warmth erythema or effusion.  Slight patellofemoral crepitus with passive range of motion.  Impression: Right knee osteoarthritis  Plan: This point time the knee could had a cortisone injection but overall is doing well enough he does not feel it is necessary.  We will try Voltaren gel 4 g 4 times daily to the knee.  In regards to exercise did review with him quad strengthening exercises and knee friendly exercises which include swimming: Elliptical and bike stationary for regular bike.  He will follow-up with [place] as needed.  Questions encouraged and answered at length..  He understands he needs to wait at least 6 months between supplemental injections in 3 months between cortisone injections.

## 2021-07-22 ENCOUNTER — Telehealth: Payer: Self-pay

## 2021-07-22 MED ORDER — ISOSORBIDE MONONITRATE ER 30 MG PO TB24: 15.0000 mg | ORAL_TABLET | Freq: Every day | ORAL | 3 refills | Status: DC

## 2021-07-22 NOTE — Telephone Encounter (Signed)
Pt walk in to clinic.  Pt complaining of intermittent chest pain.  This is a chronic problem for Pt.  Per Pt bisoprolol had helped with chest pain, but stopped working a few weeks ago.  Pt with known sub occluded LAD with collaterals.  Discussed with Dr. Thomasene Mohair to stop bisoprolol and start Imdur 15 mg PO daily.  Follow up with APP in one month.  Pt advised.  Prescription sent to pharmacy.

## 2021-07-25 ENCOUNTER — Ambulatory Visit (INDEPENDENT_AMBULATORY_CARE_PROVIDER_SITE_OTHER): Payer: Medicare Other | Admitting: Cardiovascular Disease

## 2021-07-25 ENCOUNTER — Encounter: Payer: Self-pay | Admitting: Cardiovascular Disease

## 2021-07-25 ENCOUNTER — Other Ambulatory Visit: Payer: Self-pay

## 2021-07-25 ENCOUNTER — Telehealth: Payer: Self-pay | Admitting: Internal Medicine

## 2021-07-25 VITALS — BP 118/68 | HR 68 | Ht 72.25 in | Wt 176.4 lb

## 2021-07-25 DIAGNOSIS — Z79899 Other long term (current) drug therapy: Secondary | ICD-10-CM

## 2021-07-25 DIAGNOSIS — I25110 Atherosclerotic heart disease of native coronary artery with unstable angina pectoris: Secondary | ICD-10-CM

## 2021-07-25 DIAGNOSIS — Z01812 Encounter for preprocedural laboratory examination: Secondary | ICD-10-CM

## 2021-07-25 DIAGNOSIS — Z7901 Long term (current) use of anticoagulants: Secondary | ICD-10-CM | POA: Diagnosis not present

## 2021-07-25 NOTE — Telephone Encounter (Signed)
Spoke with pt who reports he continues to have intermittent CP despite adding Imdur on 07/22/2021.   This has been going on for about 8 or 9 days.  He reports CP begins left to the center of his heart with radiation to his back and left arm. He denies N&V or diaphoresis during CP episode. He denies current CP, SOB or dizziness.  He is taking medications as prescribed.  Reviewed ED precautions with pt. Spoke with Dr Angelena Form, DOD. Appointment scheduled with Dr Angelena Form today at 340pm for further evaluation.  Pt verbalizes understanding and agrees with current plan.

## 2021-07-25 NOTE — Patient Instructions (Signed)
Medication Instructions:  No changes today *If you need a refill on your cardiac medications before your next appointment, please call your pharmacy*   Lab Work: Today: CBC, BMET   Testing/Procedures: Your physician has requested that you have a cardiac catheterization. Cardiac catheterization is used to diagnose and/or treat various heart conditions. Doctors may recommend this procedure for a number of different reasons. The most common reason is to evaluate chest pain. Chest pain can be a symptom of coronary artery disease (CAD), and cardiac catheterization can show whether plaque is narrowing or blocking your hearts arteries. This procedure is also used to evaluate the valves, as well as measure the blood flow and oxygen levels in different parts of your heart. For further information please visit [email or web address]. Please follow instruction sheet, as given.   Follow-Up: At [facility], you and your health needs are our priority.  As part of our continuing mission to provide you with exceptional heart care, we have created designated Provider Care Teams.  These Care Teams include your primary Cardiologist (physician) and Advanced Practice Providers (APPs -  Physician Assistants and Nurse Practitioners) who all work together to provide you with the care you need, when you need it.  We recommend signing up for the patient portal called "MyChart".  Sign up information is provided on this After Visit Summary.  MyChart is used to connect with patients for Virtual Visits (Telemedicine).  Patients are able to view lab/test results, encounter notes, upcoming appointments, etc.  Non-urgent messages can be sent to your provider as well.   To learn more about what you can do with MyChart, go to [email or web address].    Your next appointment:   4-6 week(s)  The format for your next appointment:   In Person  Provider:   None    Other Instructions  [facility] OFFICE [facility], [place] [address] Dept: [phone] Loc: [place]  07/25/2021  You are scheduled for a Cardiac Catheterization on Tuesday, February 14 with Dr. Glenetta Hew.  1. Please arrive at the [facility] (Main Entrance A) at [facility]: [address] [place], [place] [address] at 7:30 AM (This time is two hours before your procedure to ensure your preparation). Free valet parking service is available.   Special note: Every effort is made to have your procedure done on time. Please understand that emergencies sometimes delay scheduled procedures.  2. Diet: Do not eat solid foods after midnight.  The patient may have clear liquids until 5am upon the day of the procedure.  3. Labs: You will need to have blood drawn today.  You do not need to be fasting.  4. Medication instructions in preparation for your procedure:   Contrast Allergy: No  Do not take warfarin (Coumadin) after Wed Feb 8.  Please contact Dr. Silvestre Mesi office tomorrow to let them know you are having a heart catheterization and will be holding Coumadin and may need bridged with Lovenox.   On the morning of your procedure, take your Aspirin and any morning medicines NOT listed above.  You may use sips of water.  5. Plan for one night stay--bring personal belongings. 6. Bring a current list of your medications and current insurance cards. 7. You MUST have a responsible person to drive you home. 8. Someone MUST be with you the first 24 hours after you arrive home or your discharge will be delayed.  9. Please wear clothes that are easy to get on and off and wear slip-on shoes.  Thank you for allowing [place] to care for you!   -- [facility]

## 2021-07-25 NOTE — Telephone Encounter (Signed)
Pt c/o medication issue:  1. Name of Medication:  isosorbide mononitrate (IMDUR) 30 MG 24 hr tablet     2. How are you currently taking this medication (dosage and times per day)? Take 0.5 tablets (15 mg total) by mouth daily.  3. Are you having a reaction (difficulty breathing--STAT)? yes  4. What is your medication issue? Not getting a relief from the pain medicine was prescribed to help with.. pt is in more pain   Patient c/o Palpitations:  High priority if patient c/o lightheadedness, shortness of breath, or chest pain  How long have you had palpitations/irregular HR/ Afib? Are you having the symptoms now? Since yesterday... is now having symptoms, heart skipping a beat   Are you currently experiencing lightheadedness, SOB or CP? Chest pain  Do you have a history of afib (atrial fibrillation) or irregular heart rhythm? No   Have you checked your BP or HR? (document readings if available): 135/77 hr 62  Are you experiencing any other symptoms? no

## 2021-07-25 NOTE — Progress Notes (Signed)
Chief Complaint  Patient presents with   Follow-up    CAD, chest pain      History of Present Illness: 87 yo male with history of atrial flutter s/p ablation, non-ischemic cardiomyopathy. chronic diastolic CHF, DM, hyperlipidemia, HTN, LBBB, orthostatic hypotension, sinus node dysfunction s/p pacemaker placement, CAD and PE on lifelong coumadin therapy who is here today as an add on to my schedule with c/o chest pain. He is followed in our office by Dr. Caryl Comes and has also been seen by Dr. Gasper Sells. He is known to have CAD with coronary CTA in June 2019 showing occlusion of the proximal LAD. Minimal disease in the RCA and Circumflex. Echo August 2022 with LVEF=30-35% with global hypokinesis, mild AI. He has been on a beta blocker and Entresto. He is on coumadin therapy given history of PE and atrial flutter. He states that he has never interrupted his coumadin therapy for procedures. He was seen by Dr. Gasper Sells 05/05/21 and had c/o occasional chest pains. No ischemic testing was arranged. He was seen by Dr. Caryl Comes in December 2022 and was doing well with c/o fatigue only. He called our office 07/22/21 with c/o on and off chest pain. Bisoprolol was stopped and Imdur 15 mg daily was started. He called our office today reporting no resolution of chest pain with Imdur. He tells me today that he has pain in the upper back and in his lower chest. There is radiation of pain into his left arm. There is associated dyspnea. The pain lasts for several minutes. This is occurring multiple times per day.   Primary Care Physician: Crist Infante, MD Primary Cardiologist: Dr. Cindra Eves  Past Medical History:  Diagnosis Date   Arthritis    Atrial flutter [facility])    RFCA    BPH (benign prostatic hypertrophy)    Cardiomyopathy, nonischemic -resolved    Echo 2008: EF 25% Cardiac CT 2009: Normal cors EF 31% Echo 3/10 EF 45% Echo 6/13: EF 50-55%     CHF (congestive heart failure) (HCC)    Chicken pox     Colon polyps    hyperplastic   Diabetes mellitus ([place])    ED (erectile dysfunction)    Gilbert's syndrome    possible   Hemorrhoids    Herniated cervical disc    HLD (hyperlipidemia)    HTN (hypertension)    LBBB (left bundle branch block)    Orthostatic hypotension    Pacemaker  Medtronic    DOI 2/06   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[place])    Pulmonary embolism ([place])    Sinus node dysfunction ([place])         Past Surgical History:  Procedure Laterality Date   BIV UPGRADE N/A 08/25/2020   Procedure: BIV PPM UPGRADE;  Surgeon: Deboraha Sprang, MD;  Location: [facility];  Service: Cardiovascular;  Laterality: N/A;   CARDIOVERSION     COLONOSCOPY WITH PROPOFOL N/A 01/20/2021   Procedure: COLONOSCOPY WITH PROPOFOL;  Surgeon: Irene Shipper, MD;  Location: [facility];  Service: Endoscopy;  Laterality: N/A;   coronary ablation     CYSTOSCOPY W/ TRANSURETHRAL RESECTION OF POSTERIOR URETHERAL VALVES     PACEMAKER GENERATOR CHANGE N/A 05/13/2014   Procedure: PACEMAKER GENERATOR CHANGE;  Surgeon: Deboraha Sprang, MD;  [facility] CATH LAB;  Service: Cardiovascular;  Laterality: N/A;   PACEMAKER INSERTION     POLYPECTOMY  01/20/2021   Procedure: POLYPECTOMY;  Surgeon: Irene Shipper, MD;  Location: [facility];  Service: Endoscopy;;  Current Outpatient Medications  Medication Sig Dispense Refill   Calcium Carb-Cholecalciferol (CALCIUM 600+D3) 600-800 MG-UNIT TABS Take 1 tablet by mouth daily.     cholecalciferol (VITAMIN D) 1000 UNITS tablet Take 1,000 Units by mouth daily.     Cyanocobalamin (VITAMIN B-12) 2500 MCG SUBL Place 2,500 mcg under the tongue daily.     finasteride (PROSCAR) 5 MG tablet Take 5 mg by mouth every evening.     isosorbide mononitrate (IMDUR) 30 MG 24 hr tablet Take 0.5 tablets (15 mg total) by mouth daily. 45 tablet 3   mirtazapine (REMERON) 15 MG tablet Take 15 mg by mouth at bedtime.     Omega-3 Fatty Acids (FISH OIL) 1200 MG CAPS Take 2,400 mg by mouth daily.      polyethylene glycol (MIRALAX / GLYCOLAX) 17 g packet Take 17 g by mouth daily as needed (constipation).     PRESCRIPTION MEDICATION Apply 1 application topically See admin instructions. Dermatological intment for wound care - apply to tailbone daily as needed for sores     Probiotic Product (ALIGN PO) Take 4 mg by mouth daily.     PSYLLIUM PO Take 10 mLs by mouth daily as needed (constipation). Mix in 8 oz liquid and drink     sacubitril-valsartan (ENTRESTO) 24-26 MG Take 1 tablet by mouth 2 (two) times daily. 180 tablet 1   sildenafil (REVATIO) 20 MG tablet Take 100 mg by mouth daily as needed (erectile dysfunction).     simvastatin (ZOCOR) 40 MG tablet Take 20 mg by mouth 2 (two) times daily.     tamsulosin (FLOMAX) 0.4 MG CAPS capsule Take 0.4 mg by mouth every morning.      warfarin (COUMADIN) 5 MG tablet Take 5-7.5 mg by mouth See admin instructions. Take 1 tablet (5 mg) by mouth on Sundays, Tuesdays, Thursdays & Saturdays. Take 1.5 tablets (7.5 mg) by mouth on Mondays, Wednesdays, & Fridays.     No current facility-administered medications for this visit.    No Known Allergies  Social History   Socioeconomic History   Marital status: Married    Spouse name: Not on file   Number of children: 4   Years of education: Not on file   Highest education level: Not on file  Occupational History   Occupation: Insurance underwriter: SELF-EMPLOYED  Tobacco Use   Smoking status: Former    Types: Cigarettes    Quit date: 10/17/1970    Years since quitting: 50.8   Smokeless tobacco: Never  Vaping Use   Vaping Use: Never used  Substance and Sexual Activity   Alcohol use: Yes    Comment: Drink every day   Drug use: No   Sexual activity: Not on file  Other Topics Concern   Not on file  Social History Narrative   Not on file   Social Determinants of Health   Financial Resource Strain: Not on file  Food Insecurity: Not on file  Transportation Needs: Not on file  Physical Activity:  Not on file  Stress: Not on file  Social Connections: Not on file  Intimate Partner Violence: Not on file    Family History  Problem Relation Age of Onset   Breast cancer Mother    Colon polyps Father        pre-cancerous   Bladder Cancer Father     Review of Systems:  As stated in the HPI and otherwise negative.   BP 118/68    Pulse 68  Ht 6' 0.25" (1.835 m)    Wt 176 lb 6.4 oz (80 kg)    SpO2 95%    BMI 23.76 kg/m   Physical Examination: General: Well developed, well nourished, NAD  HEENT: OP clear, mucus membranes moist  SKIN: warm, dry. No rashes. Neuro: No focal deficits  Musculoskeletal: Muscle strength 5/5 all ext  Psychiatric: Mood and affect normal  Neck: No JVD, no carotid bruits, no thyromegaly, no lymphadenopathy.  Lungs:Clear bilaterally, no wheezes, rhonci, crackles Cardiovascular: Regular rate and rhythm. No murmurs, gallops or rubs. Abdomen:Soft. Bowel sounds present. Non-tender.  Extremities: No lower extremity edema. Pulses are 2 + in the bilateral DP/PT.  EKG:  EKG is ordered today. The ekg ordered today demonstrates sinus, PVCs, ? Paced beats  Recent Labs: 02/25/2021: Hemoglobin 14.5; Platelets 142 05/05/2021: BUN 14; Creatinine, Ser 1.14; NT-Pro BNP 124; Potassium 4.6; Sodium 140   Lipid Panel No results found for: CHOL, TRIG, HDL, CHOLHDL, VLDL, LDLCALC, LDLDIRECT   Wt Readings from Last 3 Encounters:  07/25/21 176 lb 6.4 oz (80 kg)  06/01/21 175 lb (79.4 kg)  05/05/21 176 lb (79.8 kg)     Assessment and Plan:   1. CAD with unstable angina: He is known to have severe CAD by coronary CTA in 2019. This suggested his LAD was occluded proximally. He has not had a cardiac cath. He is now having chest pain consistent with unstable angina. Cardiac catheterization is indicated. He is on coumadin therapy and this is for prior PE. I think he will need a Lovenox bridge while coumadin is being held.  -We will arrange his cardiac cath next Tuesday August 02, 2021 at 9am at [facility] with Dr. Ellyn Hack.   -I have reviewed the risks, indications, and alternatives to cardiac catheterization, possible angioplasty, and stenting with the patient. Risks include but are not limited to bleeding, infection, vascular injury, stroke, myocardial infection, arrhythmia, kidney injury, radiation-related injury in the case of prolonged fluoroscopy use, emergency cardiac surgery, and death. The patient understands the risks of serious complication is 1-2 in 8299 with diagnostic cardiac cath and 1-2% or less with angioplasty/stenting.  -Will continue his statin. He may benefit from a beta blocker post cath -ASA day of cath -I will increase his Imdur to 15 mg po BID. He prefers this to 30 mg in the morning.  -BMET and CBC today. INR morning of the cath -He will contact Dr. Silvestre Mesi office to arrange Lovenox bridging while coumadin is held pre-cath. We can assist with this if needed but I have reviewed with our office PharmD and since we do not manage his coumadin, we would prefer that the Lovenox be arranged by [facility].  -If he has severe pain that does not resolve, he will present to the ED for evaluation.   Labs/ tests ordered today include:   Orders Placed This Encounter  Procedures   Basic metabolic panel   CBC   EKG 12-Lead   Disposition:   F/U with Dr. Caryl Comes or office APP 2-3 weeks post cath.   Signed, Lauree Chandler, MD 07/25/2021 5:08 PM    [facility] [address], [place], [place]  [address] Phone: [phone]; Fax: [phone]

## 2021-07-25 NOTE — H&P (View-Only) (Signed)
Chief Complaint  Patient presents with   Follow-up    CAD, chest pain      History of Present Illness: 87 yo male with history of atrial flutter s/p ablation, non-ischemic cardiomyopathy. chronic diastolic CHF, DM, hyperlipidemia, HTN, LBBB, orthostatic hypotension, sinus node dysfunction s/p pacemaker placement, CAD and PE on lifelong coumadin therapy who is here today as an add on to my schedule with c/o chest pain. He is followed in our office by Dr. Caryl Comes and has also been seen by Dr. Gasper Sells. He is known to have CAD with coronary CTA in June 2019 showing occlusion of the proximal LAD. Minimal disease in the RCA and Circumflex. Echo August 2022 with LVEF=30-35% with global hypokinesis, mild AI. He has been on a beta blocker and Entresto. He is on coumadin therapy given history of PE and atrial flutter. He states that he has never interrupted his coumadin therapy for procedures. He was seen by Dr. Gasper Sells 05/05/21 and had c/o occasional chest pains. No ischemic testing was arranged. He was seen by Dr. Caryl Comes in December 2022 and was doing well with c/o fatigue only. He called our office 07/22/21 with c/o on and off chest pain. Bisoprolol was stopped and Imdur 15 mg daily was started. He called our office today reporting no resolution of chest pain with Imdur. He tells me today that he has pain in the upper back and in his lower chest. There is radiation of pain into his left arm. There is associated dyspnea. The pain lasts for several minutes. This is occurring multiple times per day.   Primary Care Physician: Crist Infante, MD Primary Cardiologist: Dr. Cindra Eves  Past Medical History:  Diagnosis Date   Arthritis    Atrial flutter [facility])    RFCA    BPH (benign prostatic hypertrophy)    Cardiomyopathy, nonischemic -resolved    Echo 2008: EF 25% Cardiac CT 2009: Normal cors EF 31% Echo 3/10 EF 45% Echo 6/13: EF 50-55%     CHF (congestive heart failure) (HCC)    Chicken pox     Colon polyps    hyperplastic   Diabetes mellitus ([place])    ED (erectile dysfunction)    Gilbert's syndrome    possible   Hemorrhoids    Herniated cervical disc    HLD (hyperlipidemia)    HTN (hypertension)    LBBB (left bundle branch block)    Orthostatic hypotension    Pacemaker  Medtronic    DOI 2/06   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[place])    Pulmonary embolism ([place])    Sinus node dysfunction ([place])         Past Surgical History:  Procedure Laterality Date   BIV UPGRADE N/A 08/25/2020   Procedure: BIV PPM UPGRADE;  Surgeon: Deboraha Sprang, MD;  Location: [place] [facility];  Service: Cardiovascular;  Laterality: N/A;   CARDIOVERSION     COLONOSCOPY WITH PROPOFOL N/A 01/20/2021   Procedure: COLONOSCOPY WITH PROPOFOL;  Surgeon: Irene Shipper, MD;  [facility]: [facility];  Service: Endoscopy;  Laterality: N/A;   coronary ablation     CYSTOSCOPY W/ TRANSURETHRAL RESECTION OF POSTERIOR URETHERAL VALVES     PACEMAKER GENERATOR CHANGE N/A 05/13/2014   Procedure: PACEMAKER GENERATOR CHANGE;  Surgeon: Deboraha Sprang, MD;  Location: [facility] CATH [facility];  Service: Cardiovascular;  Laterality: N/A;   PACEMAKER INSERTION     POLYPECTOMY  01/20/2021   Procedure: POLYPECTOMY;  Surgeon: Irene Shipper, MD;  [facility]: [facility];  Service: Endoscopy;;  Current Outpatient Medications  Medication Sig Dispense Refill   Calcium Carb-Cholecalciferol (CALCIUM 600+D3) 600-800 MG-UNIT TABS Take 1 tablet by mouth daily.     cholecalciferol (VITAMIN D) 1000 UNITS tablet Take 1,000 Units by mouth daily.     Cyanocobalamin (VITAMIN B-12) 2500 MCG SUBL Place 2,500 mcg under the tongue daily.     finasteride (PROSCAR) 5 MG tablet Take 5 mg by mouth every evening.     isosorbide mononitrate (IMDUR) 30 MG 24 hr tablet Take 0.5 tablets (15 mg total) by mouth daily. 45 tablet 3   mirtazapine (REMERON) 15 MG tablet Take 15 mg by mouth at bedtime.     Omega-3 Fatty Acids (FISH OIL) 1200 MG CAPS Take 2,400 mg by mouth daily.      polyethylene glycol (MIRALAX / GLYCOLAX) 17 g packet Take 17 g by mouth daily as needed (constipation).     PRESCRIPTION MEDICATION Apply 1 application topically See admin instructions. Dermatological intment for wound care - apply to tailbone daily as needed for sores     Probiotic Product (ALIGN PO) Take 4 mg by mouth daily.     PSYLLIUM PO Take 10 mLs by mouth daily as needed (constipation). Mix in 8 oz liquid and drink     sacubitril-valsartan (ENTRESTO) 24-26 MG Take 1 tablet by mouth 2 (two) times daily. 180 tablet 1   sildenafil (REVATIO) 20 MG tablet Take 100 mg by mouth daily as needed (erectile dysfunction).     simvastatin (ZOCOR) 40 MG tablet Take 20 mg by mouth 2 (two) times daily.     tamsulosin (FLOMAX) 0.4 MG CAPS capsule Take 0.4 mg by mouth every morning.      warfarin (COUMADIN) 5 MG tablet Take 5-7.5 mg by mouth See admin instructions. Take 1 tablet (5 mg) by mouth on Sundays, Tuesdays, Thursdays & Saturdays. Take 1.5 tablets (7.5 mg) by mouth on Mondays, Wednesdays, & Fridays.     No current facility-administered medications for this visit.    No Known Allergies  Social History   Socioeconomic History   Marital status: Married    Spouse name: Not on file   Number of children: 4   Years of education: Not on file   Highest education level: Not on file  Occupational History   Occupation: Insurance underwriter: SELF-EMPLOYED  Tobacco Use   Smoking status: Former    Types: Cigarettes    Quit date: 10/17/1970    Years since quitting: 50.8   Smokeless tobacco: Never  Vaping Use   Vaping Use: Never used  Substance and Sexual Activity   Alcohol use: Yes    Comment: Drink every day   Drug use: No   Sexual activity: Not on file  Other Topics Concern   Not on file  Social History Narrative   Not on file   Social Determinants of Health   Financial Resource Strain: Not on file  Food Insecurity: Not on file  Transportation Needs: Not on file  Physical Activity:  Not on file  Stress: Not on file  Social Connections: Not on file  Intimate Partner Violence: Not on file    Family History  Problem Relation Age of Onset   Breast cancer Mother    Colon polyps Father        pre-cancerous   Bladder Cancer Father     Review of Systems:  As stated in the HPI and otherwise negative.   BP 118/68    Pulse 68  Ht 6' 0.25" (1.835 m)    Wt 176 lb 6.4 oz (80 kg)    SpO2 95%    BMI 23.76 kg/m   Physical Examination: General: Well developed, well nourished, NAD  HEENT: OP clear, mucus membranes moist  SKIN: warm, dry. No rashes. Neuro: No focal deficits  Musculoskeletal: Muscle strength 5/5 all ext  Psychiatric: Mood and affect normal  Neck: No JVD, no carotid bruits, no thyromegaly, no lymphadenopathy.  Lungs:Clear bilaterally, no wheezes, rhonci, crackles Cardiovascular: Regular rate and rhythm. No murmurs, gallops or rubs. Abdomen:Soft. Bowel sounds present. Non-tender.  Extremities: No lower extremity edema. Pulses are 2 + in the bilateral DP/PT.  EKG:  EKG is ordered today. The ekg ordered today demonstrates sinus, PVCs, ? Paced beats  Recent Labs: 02/25/2021: Hemoglobin 14.5; Platelets 142 05/05/2021: BUN 14; Creatinine, Ser 1.14; NT-Pro BNP 124; Potassium 4.6; Sodium 140   Lipid Panel No results found for: CHOL, TRIG, HDL, CHOLHDL, VLDL, LDLCALC, LDLDIRECT   Wt Readings from Last 3 Encounters:  07/25/21 176 lb 6.4 oz (80 kg)  06/01/21 175 lb (79.4 kg)  05/05/21 176 lb (79.8 kg)     Assessment and Plan:   1. CAD with unstable angina: He is known to have severe CAD by coronary CTA in 2019. This suggested his LAD was occluded proximally. He has not had a cardiac cath. He is now having chest pain consistent with unstable angina. Cardiac catheterization is indicated. He is on coumadin therapy and this is for prior PE. I think he will need a Lovenox bridge while coumadin is being held.  -We will arrange his cardiac cath next Tuesday August 02, 2021 at 9am at [facility] with Dr. Ellyn Hack.   -I have reviewed the risks, indications, and alternatives to cardiac catheterization, possible angioplasty, and stenting with the patient. Risks include but are not limited to bleeding, infection, vascular injury, stroke, myocardial infection, arrhythmia, kidney injury, radiation-related injury in the case of prolonged fluoroscopy use, emergency cardiac surgery, and death. The patient understands the risks of serious complication is 1-2 in 3570 with diagnostic cardiac cath and 1-2% or less with angioplasty/stenting.  -Will continue his statin. He may benefit from a beta blocker post cath -ASA day of cath -I will increase his Imdur to 15 mg po BID. He prefers this to 30 mg in the morning.  -BMET and CBC today. INR morning of the cath -He will contact Dr. Silvestre Mesi office to arrange Lovenox bridging while coumadin is held pre-cath. We can assist with this if needed but I have reviewed with our office PharmD and since we do not manage his coumadin, we would prefer that the Lovenox be arranged by [facility].  -If he has severe pain that does not resolve, he will present to the ED for evaluation.   Labs/ tests ordered today include:   Orders Placed This Encounter  Procedures   Basic metabolic panel   CBC   EKG 12-Lead   Disposition:   F/U with Dr. Caryl Comes or office APP 2-3 weeks post cath.   Signed, Lauree Chandler, MD 07/25/2021 5:08 PM    [facility] [place], [place],   [address] Phone: [phone]; Fax: [phone]

## 2021-07-26 LAB — BASIC METABOLIC PANEL
BUN/Creatinine Ratio: 15 (ref 10–24)
BUN: 17 mg/dL (ref 8–27)
CO2: 25 mmol/L (ref 20–29)
Calcium: 10.1 mg/dL (ref 8.6–10.2)
Chloride: 107 mmol/L — ABNORMAL HIGH (ref 96–106)
Creatinine, Ser: 1.11 mg/dL (ref 0.76–1.27)
Glucose: 86 mg/dL (ref 70–99)
Potassium: 4.3 mmol/L (ref 3.5–5.2)
Sodium: 144 mmol/L (ref 134–144)
eGFR: 65 mL/min/{1.73_m2} (ref >59)

## 2021-07-26 LAB — CBC
Hematocrit: 45.2 % (ref 37.5–51.0)
Hemoglobin: 15 g/dL (ref 13.0–17.7)
MCH: 28.2 pg (ref 26.6–33.0)
MCHC: 33.2 g/dL (ref 31.5–35.7)
MCV: 85 fL (ref 79–97)
Platelets: 128 10*3/uL — ABNORMAL LOW (ref 150–450)
RBC: 5.31 x10E6/uL (ref 4.14–5.80)
RDW: 13.9 % (ref 11.6–15.4)
WBC: 7.6 10*3/uL (ref 3.4–10.8)

## 2021-07-28 ENCOUNTER — Telehealth: Payer: Self-pay | Admitting: *Deleted

## 2021-07-28 NOTE — Telephone Encounter (Signed)
Call placed to patient to ask if he had received instructions from Dr Joylene Draft about holding warfarin/lovenox bridge prior to Cardiac Cath Tuesday August 02, 2021. Patient reports he received instructions yesterday from Dr Joylene Draft -last warfarin dose 07/27/21 until post procedure, has instructions for lovenox bridge starting today, 07/28/21.

## 2021-08-01 NOTE — Telephone Encounter (Signed)
Patient reports endodontist was able to repair tooth today, patient ready to proceed with Left Heart Cath 08/02/21.

## 2021-08-01 NOTE — Telephone Encounter (Signed)
Cardiac catheterization scheduled at [facility] for: Tuesday August 02, 2021 9:30 AM [facility] Main Entrance [facility]) at: 7:30 AM   Diet-no solid food after midnight prior to cath, clear liquids until 5 AM day of procedure  Medication instructions for procedure: -Hold:  Warfarin/Lovenox-Patient reports instructions from Dr Joylene Draft -last warfarin dose 07/27/21 until post procedure, has instructions for lovenox bridge starting 07/28/21.  -Except hold medications usual morning medications can be taken pre-cath with sips of water including aspirin 81 mg.    Must have responsible adult to drive home post procedure and be with patient first 24 hours after arriving home.  [facility] does allow one visitor to wait in the waiting room during the time you are there.   Patient reports does not currently have any new symptoms concerning for COVID-19 and no household members with COVID-19 like illness.     Reviewed procedure instructions with patient.

## 2021-08-01 NOTE — Telephone Encounter (Signed)
Patient reports periodontist did dental implant one week ago, had x-rays done at that time. Patient reports infection in different tooth was found on x-ray, placed on amoxicillin for 1 week (last dose today), referred to endodontist, appointment with endodontist this afternoon at 2 PM.  Patient reports no fever, minimal pain at site of infected tooth, ready to proceed with cath especially since off warfarin/lovenox bridge for cath 08/02/21. Patient asking if okay to proceed with cath.  Patient aware I will forward to Dr Angelena Form for his review and recommendation about proceeding with cath 08/02/21. I will also plan to follow up with patient after his appointment with endodontist this afternoon.

## 2021-08-01 NOTE — Telephone Encounter (Signed)
Patient aware Dr Angelena Form did not think dental issues should change plans for cath.

## 2021-08-02 ENCOUNTER — Ambulatory Visit (HOSPITAL_COMMUNITY)
Admission: RE | Admit: 2021-08-02 | Discharge: 2021-08-02 | Disposition: A | Discharge status: Home / Self Care | Payer: Medicare Other | Attending: Cardiology | Admitting: Cardiology

## 2021-08-02 ENCOUNTER — Other Ambulatory Visit: Payer: Self-pay

## 2021-08-02 ENCOUNTER — Encounter (HOSPITAL_COMMUNITY)
Admission: RE | Disposition: A | Discharge status: Home / Self Care | Payer: Self-pay | Source: Home / Self Care | Attending: Cardiology

## 2021-08-02 DIAGNOSIS — Z950 Presence of cardiac pacemaker: Secondary | ICD-10-CM | POA: Diagnosis not present

## 2021-08-02 DIAGNOSIS — I428 Other cardiomyopathies: Secondary | ICD-10-CM | POA: Insufficient documentation

## 2021-08-02 DIAGNOSIS — I110 Hypertensive heart disease with heart failure: Secondary | ICD-10-CM | POA: Insufficient documentation

## 2021-08-02 DIAGNOSIS — I495 Sick sinus syndrome: Secondary | ICD-10-CM | POA: Diagnosis not present

## 2021-08-02 DIAGNOSIS — I25110 Atherosclerotic heart disease of native coronary artery with unstable angina pectoris: Secondary | ICD-10-CM | POA: Diagnosis not present

## 2021-08-02 DIAGNOSIS — I25119 Atherosclerotic heart disease of native coronary artery with unspecified angina pectoris: Secondary | ICD-10-CM | POA: Diagnosis present

## 2021-08-02 DIAGNOSIS — Z87891 Personal history of nicotine dependence: Secondary | ICD-10-CM | POA: Insufficient documentation

## 2021-08-02 DIAGNOSIS — E119 Type 2 diabetes mellitus without complications: Secondary | ICD-10-CM | POA: Diagnosis not present

## 2021-08-02 DIAGNOSIS — I5032 Chronic diastolic (congestive) heart failure: Secondary | ICD-10-CM | POA: Diagnosis not present

## 2021-08-02 DIAGNOSIS — E785 Hyperlipidemia, unspecified: Secondary | ICD-10-CM | POA: Diagnosis not present

## 2021-08-02 DIAGNOSIS — I2582 Chronic total occlusion of coronary artery: Secondary | ICD-10-CM | POA: Insufficient documentation

## 2021-08-02 DIAGNOSIS — Z7901 Long term (current) use of anticoagulants: Secondary | ICD-10-CM | POA: Diagnosis not present

## 2021-08-02 DIAGNOSIS — I200 Unstable angina: Secondary | ICD-10-CM | POA: Diagnosis present

## 2021-08-02 DIAGNOSIS — I255 Ischemic cardiomyopathy: Secondary | ICD-10-CM | POA: Diagnosis present

## 2021-08-02 HISTORY — PX: LEFT HEART CATH AND CORONARY ANGIOGRAPHY: CATH118249

## 2021-08-02 LAB — GLUCOSE, CAPILLARY: Glucose-Capillary: 74 mg/dL (ref 70–99)

## 2021-08-02 LAB — PROTIME-INR
INR: 1 (ref 0.8–1.2)
Prothrombin Time: 13.1 seconds (ref 11.4–15.2)

## 2021-08-02 SURGERY — LEFT HEART CATH AND CORONARY ANGIOGRAPHY
Anesthesia: LOCAL

## 2021-08-02 MED ORDER — LIDOCAINE HCL (PF) 1 % IJ SOLN
INTRAMUSCULAR | Status: DC | PRN
  Administered 2021-08-02: 2 mL

## 2021-08-02 MED ORDER — ASPIRIN 81 MG PO CHEW: 81.0000 mg | CHEWABLE_TABLET | ORAL | Status: DC

## 2021-08-02 MED ORDER — SODIUM CHLORIDE 0.9 % IV SOLN: 250.0000 mL | INTRAVENOUS | Status: DC | PRN

## 2021-08-02 MED ORDER — ONDANSETRON HCL 4 MG/2ML IJ SOLN: 4.0000 mg | Freq: Four times a day (QID) | INTRAMUSCULAR | Status: DC | PRN

## 2021-08-02 MED ORDER — LABETALOL HCL 5 MG/ML IV SOLN: 10.0000 mg | INTRAVENOUS | Status: DC | PRN

## 2021-08-02 MED ORDER — SODIUM CHLORIDE 0.9% FLUSH: 3.0000 mL | Freq: Two times a day (BID) | INTRAVENOUS | Status: DC

## 2021-08-02 MED ORDER — SODIUM CHLORIDE 0.9% FLUSH: 3.0000 mL | INTRAVENOUS | Status: DC | PRN

## 2021-08-02 MED ORDER — LIDOCAINE HCL (PF) 1 % IJ SOLN
INTRAMUSCULAR | Status: AC
  Filled 2021-08-02: qty 30

## 2021-08-02 MED ORDER — HEPARIN (PORCINE) IN NACL 1000-0.9 UT/500ML-% IV SOLN
INTRAVENOUS | Status: AC
  Filled 2021-08-02: qty 1000

## 2021-08-02 MED ORDER — HEPARIN SODIUM (PORCINE) 1000 UNIT/ML IJ SOLN
INTRAMUSCULAR | Status: DC | PRN
  Administered 2021-08-02: 4500 [IU] via INTRAVENOUS

## 2021-08-02 MED ORDER — VERAPAMIL HCL 2.5 MG/ML IV SOLN
INTRAVENOUS | Status: AC
  Filled 2021-08-02: qty 2

## 2021-08-02 MED ORDER — HEPARIN (PORCINE) IN NACL 1000-0.9 UT/500ML-% IV SOLN
INTRAVENOUS | Status: DC | PRN
  Administered 2021-08-02 (×2): 500 mL

## 2021-08-02 MED ORDER — ACETAMINOPHEN 325 MG PO TABS: 650.0000 mg | ORAL_TABLET | ORAL | Status: DC | PRN

## 2021-08-02 MED ORDER — SODIUM CHLORIDE 0.9 % WEIGHT BASED INFUSION: 1.0000 mL/kg/h | INTRAVENOUS | Status: DC

## 2021-08-02 MED ORDER — HEPARIN SODIUM (PORCINE) 1000 UNIT/ML IJ SOLN
INTRAMUSCULAR | Status: AC
  Filled 2021-08-02: qty 10

## 2021-08-02 MED ORDER — SODIUM CHLORIDE 0.9 % IV SOLN: INTRAVENOUS | Status: DC

## 2021-08-02 MED ORDER — SODIUM CHLORIDE 0.9 % WEIGHT BASED INFUSION
3.0000 mL/kg/h | INTRAVENOUS | Status: AC
  Administered 2021-08-02: 3 mL/kg/h via INTRAVENOUS

## 2021-08-02 MED ORDER — IOHEXOL 350 MG/ML SOLN
INTRAVENOUS | Status: DC | PRN
  Administered 2021-08-02: 45 mL

## 2021-08-02 MED ORDER — FENTANYL CITRATE (PF) 100 MCG/2ML IJ SOLN
INTRAMUSCULAR | Status: AC
  Filled 2021-08-02: qty 2

## 2021-08-02 MED ORDER — VERAPAMIL HCL 2.5 MG/ML IV SOLN
INTRAVENOUS | Status: DC | PRN
  Administered 2021-08-02: 10 mL via INTRA_ARTERIAL

## 2021-08-02 MED ORDER — CLOPIDOGREL BISULFATE 75 MG PO TABS: 75.0000 mg | ORAL_TABLET | Freq: Every day | ORAL | 3 refills | Status: DC

## 2021-08-02 MED ORDER — RANOLAZINE ER 500 MG PO TB12: 500.0000 mg | ORAL_TABLET | Freq: Two times a day (BID) | ORAL | 4 refills | Status: DC

## 2021-08-02 MED ORDER — FENTANYL CITRATE (PF) 100 MCG/2ML IJ SOLN
INTRAMUSCULAR | Status: DC | PRN
  Administered 2021-08-02: 25 ug via INTRAVENOUS

## 2021-08-02 MED ORDER — HYDRALAZINE HCL 20 MG/ML IJ SOLN: 10.0000 mg | INTRAMUSCULAR | Status: DC | PRN

## 2021-08-02 MED ORDER — MIDAZOLAM HCL 2 MG/2ML IJ SOLN
INTRAMUSCULAR | Status: AC
  Filled 2021-08-02: qty 2

## 2021-08-02 MED ORDER — MIDAZOLAM HCL 2 MG/2ML IJ SOLN
INTRAMUSCULAR | Status: DC | PRN
  Administered 2021-08-02: 1 mg via INTRAVENOUS

## 2021-08-02 SURGICAL SUPPLY — 11 items
CATH INFINITI JR4 5F (CATHETERS) ×1 IMPLANT
CATH OPTITORQUE TIG 4.0 5F (CATHETERS) ×1 IMPLANT
DEVICE RAD COMP TR BAND LRG (VASCULAR PRODUCTS) ×1 IMPLANT
GLIDESHEATH SLEND SS 6F .021 (SHEATH) ×1 IMPLANT
GUIDEWIRE INQWIRE 1.5J.035X260 (WIRE) IMPLANT
INQWIRE 1.5J .035X260CM (WIRE) ×2
KIT HEART LEFT (KITS) ×2 IMPLANT
PACK CARDIAC CATHETERIZATION (CUSTOM PROCEDURE TRAY) ×2 IMPLANT
TRANSDUCER W/STOPCOCK (MISCELLANEOUS) ×2 IMPLANT
TUBING CIL FLEX 10 FLL-RA (TUBING) ×2 IMPLANT
WIRE HI TORQ VERSACORE-J 145CM (WIRE) ×1 IMPLANT

## 2021-08-02 NOTE — Interval H&P Note (Signed)
History and Physical Interval Note:  08/02/2021 11:26 AM  [name]  has presented today for surgery, with the diagnosis of chest pain-with known coronary artery disease the various methods of treatment have been discussed with the patient and family. After consideration of risks, benefits and other options for treatment, the patient has consented to  Procedure(s): LEFT HEART CATH AND CORONARY ANGIOGRAPHY (N/A)  PERCUTANEOUS CORONARY INTERVENTION  as a surgical intervention.  The patient's history has been reviewed, patient examined, no change in status, stable for surgery.  I have reviewed the patient's chart and labs.  Questions were answered to the patient's satisfaction.    Cath Lab Visit (complete for each Cath Lab visit)  Clinical Evaluation Leading to the Procedure:   ACS: No.  Non-ACS:    Anginal Classification: CCS III  Anti-ischemic medical therapy: Maximal Therapy (2 or more classes of medications)  Non-Invasive Test Results: Equivocal test results - > Coronary CTA in June 2019 showed occluded LAD with minimal disease in RCA and LCx.  Now having pain and not controlled with aggressive medical therapy.  Prior CABG: No previous CABG   Glenetta Hew

## 2021-08-03 ENCOUNTER — Encounter (HOSPITAL_COMMUNITY): Payer: Self-pay | Admitting: Cardiology

## 2021-08-03 DIAGNOSIS — Z7901 Long term (current) use of anticoagulants: Secondary | ICD-10-CM | POA: Diagnosis not present

## 2021-08-03 DIAGNOSIS — K1379 Other lesions of oral mucosa: Secondary | ICD-10-CM | POA: Diagnosis not present

## 2021-08-03 DIAGNOSIS — R52 Pain, unspecified: Secondary | ICD-10-CM | POA: Diagnosis not present

## 2021-08-03 DIAGNOSIS — I240 Acute coronary thrombosis not resulting in myocardial infarction: Secondary | ICD-10-CM | POA: Diagnosis not present

## 2021-08-03 DIAGNOSIS — I200 Unstable angina: Secondary | ICD-10-CM | POA: Diagnosis not present

## 2021-08-03 DIAGNOSIS — I480 Paroxysmal atrial fibrillation: Secondary | ICD-10-CM | POA: Diagnosis not present

## 2021-08-05 DIAGNOSIS — I509 Heart failure, unspecified: Secondary | ICD-10-CM | POA: Diagnosis not present

## 2021-08-05 DIAGNOSIS — I10 Essential (primary) hypertension: Secondary | ICD-10-CM | POA: Diagnosis not present

## 2021-08-05 DIAGNOSIS — E785 Hyperlipidemia, unspecified: Secondary | ICD-10-CM | POA: Diagnosis not present

## 2021-08-05 DIAGNOSIS — I200 Unstable angina: Secondary | ICD-10-CM | POA: Diagnosis not present

## 2021-08-05 DIAGNOSIS — I2510 Atherosclerotic heart disease of native coronary artery without angina pectoris: Secondary | ICD-10-CM | POA: Diagnosis not present

## 2021-08-05 DIAGNOSIS — E119 Type 2 diabetes mellitus without complications: Secondary | ICD-10-CM | POA: Diagnosis not present

## 2021-08-05 DIAGNOSIS — I480 Paroxysmal atrial fibrillation: Secondary | ICD-10-CM | POA: Diagnosis not present

## 2021-08-09 ENCOUNTER — Ambulatory Visit: Payer: Medicare Other | Admitting: Cardiology

## 2021-08-09 DIAGNOSIS — Z7901 Long term (current) use of anticoagulants: Secondary | ICD-10-CM | POA: Diagnosis not present

## 2021-08-11 DIAGNOSIS — I10 Essential (primary) hypertension: Secondary | ICD-10-CM | POA: Diagnosis not present

## 2021-08-11 DIAGNOSIS — E782 Mixed hyperlipidemia: Secondary | ICD-10-CM | POA: Diagnosis not present

## 2021-08-11 DIAGNOSIS — R0602 Shortness of breath: Secondary | ICD-10-CM | POA: Diagnosis not present

## 2021-08-11 DIAGNOSIS — I495 Sick sinus syndrome: Secondary | ICD-10-CM | POA: Diagnosis not present

## 2021-08-11 DIAGNOSIS — Z950 Presence of cardiac pacemaker: Secondary | ICD-10-CM | POA: Diagnosis not present

## 2021-08-11 DIAGNOSIS — Z86711 Personal history of pulmonary embolism: Secondary | ICD-10-CM | POA: Diagnosis not present

## 2021-08-11 DIAGNOSIS — I7092 Chronic total occlusion of artery of the extremities: Secondary | ICD-10-CM | POA: Diagnosis not present

## 2021-08-11 DIAGNOSIS — I200 Unstable angina: Secondary | ICD-10-CM | POA: Diagnosis not present

## 2021-08-11 DIAGNOSIS — I255 Ischemic cardiomyopathy: Secondary | ICD-10-CM | POA: Diagnosis not present

## 2021-08-11 DIAGNOSIS — I447 Left bundle-branch block, unspecified: Secondary | ICD-10-CM | POA: Diagnosis not present

## 2021-08-18 NOTE — Progress Notes (Unsigned)
Electrophysiology Office Note Date: 08/18/2021  ID:  Steven Macdonald, DOB 08-15-1934, MRN [id number]  PCP: Crist Infante, MD Primary Cardiologist: None Electrophysiologist: Virl Axe, MD   CC: Pacemaker follow-up  Steven Macdonald is a 87 y.o. male seen today for Virl Axe, MD for {Blank single:19197::"cardiac clearance","post hospital follow up","acute visit due to ***","routine electrophysiology followup"}.  Since {Blank single:19197::"last being seen in our clinic","discharge from hospital"} the patient reports doing ***.  he denies chest pain, palpitations, dyspnea, PND, orthopnea, nausea, vomiting, dizziness, syncope, edema, weight gain, or early satiety.  Device History: Medtronic Dual Chamber PPM implanted 2006, gen change 2015, BiV upgrade 08/2020  Past Medical History:  Diagnosis Date   Arthritis    Atrial flutter (HCC)    RFCA    BPH (benign prostatic hypertrophy)    Cardiomyopathy, nonischemic -resolved    Echo 2008: EF 25% Cardiac CT 2009: Normal cors EF 31% Echo 3/10 EF 45% Echo 6/13: EF 50-55%     CHF (congestive heart failure) (HCC)    Chicken pox    Colon polyps    hyperplastic   Diabetes mellitus ([place])    ED (erectile dysfunction)    Gilbert's syndrome    possible   Hemorrhoids    Herniated cervical disc    HLD (hyperlipidemia)    HTN (hypertension)    LBBB (left bundle branch block)    Orthostatic hypotension    Pacemaker  Medtronic    DOI 2/06   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[place])    Pulmonary embolism ([place])    Sinus node dysfunction ([place])        Past Surgical History:  Procedure Laterality Date   BIV UPGRADE N/A 08/25/2020   Procedure: BIV PPM UPGRADE;  Surgeon: Deboraha Sprang, MD;  Location: [place] [facility];  Service: Cardiovascular;  Laterality: N/A;   CARDIOVERSION     COLONOSCOPY WITH PROPOFOL N/A 01/20/2021   Procedure: COLONOSCOPY WITH PROPOFOL;  Surgeon: Irene Shipper, MD;  [facility];  Service: Endoscopy;  Laterality: N/A;    coronary ablation     CYSTOSCOPY W/ TRANSURETHRAL RESECTION OF POSTERIOR URETHERAL VALVES     LEFT HEART CATH AND CORONARY ANGIOGRAPHY N/A 08/02/2021   Procedure: LEFT HEART CATH AND CORONARY ANGIOGRAPHY;  Surgeon: Leonie Man, MD;  Location: [place] [facility];  Service: Cardiovascular;  Laterality: N/A;   PACEMAKER GENERATOR CHANGE N/A 05/13/2014   Procedure: PACEMAKER GENERATOR CHANGE;  Surgeon: Deboraha Sprang, MD;  Location: [facility] CATH LAB;  Service: Cardiovascular;  Laterality: N/A;   PACEMAKER INSERTION     POLYPECTOMY  01/20/2021   Procedure: POLYPECTOMY;  Surgeon: Irene Shipper, MD;  [facility];  Service: Endoscopy;;    Current Outpatient Medications  Medication Sig Dispense Refill   Calcium Carb-Cholecalciferol (CALCIUM 600+D3) 600-800 MG-UNIT TABS Take 1 tablet by mouth daily.     cholecalciferol (VITAMIN D) 1000 UNITS tablet Take 1,000 Units by mouth daily.     clopidogrel (PLAVIX) 75 MG tablet Take 1 tablet (75 mg total) by mouth daily. 90 tablet 3   Cyanocobalamin (VITAMIN B-12) 2500 MCG SUBL Place 2,500 mcg under the tongue daily.     docusate sodium (COLACE) 100 MG capsule Take 200 mg by mouth daily.     enoxaparin (LOVENOX) 120 MG/0.8ML injection Inject 120 mg into the skin daily.     finasteride (PROSCAR) 5 MG tablet Take 5 mg by mouth every evening.     isosorbide mononitrate (IMDUR) 30 MG 24 hr  tablet Take 0.5 tablets (15 mg total) by mouth daily. (Patient taking differently: Take 15 mg by mouth in the morning and at bedtime.) 45 tablet 3   mirtazapine (REMERON) 15 MG tablet Take 15 mg by mouth at bedtime.     mupirocin ointment (BACTROBAN) 2 % Apply 1 application topically 2 (two) times daily.     Omega-3 Fatty Acids (FISH OIL) 1200 MG CAPS Take 2,400 mg by mouth daily.     polyethylene glycol (MIRALAX / GLYCOLAX) 17 g packet Take 17 g by mouth daily.     Probiotic Product (ALIGN PO) Take 4 mg by mouth daily.     PSYLLIUM PO Take 10 mLs by mouth daily.      ranolazine (RANEXA) 500 MG 12 hr tablet Take 1 tablet (500 mg total) by mouth 2 (two) times daily. 30 tablet 4   sacubitril-valsartan (ENTRESTO) 24-26 MG Take 1 tablet by mouth 2 (two) times daily. 180 tablet 1   sildenafil (REVATIO) 20 MG tablet Take 100 mg by mouth daily as needed (erectile dysfunction).     simvastatin (ZOCOR) 40 MG tablet Take 20 mg by mouth 2 (two) times daily.     tamsulosin (FLOMAX) 0.4 MG CAPS capsule Take 0.4 mg by mouth every morning.      triamcinolone cream (KENALOG) 0.1 % Apply 1 application topically 2 (two) times daily.     warfarin (COUMADIN) 5 MG tablet Take 5-7.5 mg by mouth See admin instructions. Take 7.5 mg by mouth daily except for Sunday take 5 mg (Patient not taking: Reported on 07/29/2021)     No current facility-administered medications for this visit.    Allergies:   Patient has no known allergies.   Social History: Social History   Socioeconomic History   Marital status: Married    Spouse name: Not on file   Number of children: 4   Years of education: Not on file   Highest education level: Not on file  Occupational History   Occupation: Insurance underwriter: SELF-EMPLOYED  Tobacco Use   Smoking status: Former    Types: Cigarettes    Quit date: 10/17/1970    Years since quitting: 50.8   Smokeless tobacco: Never  Vaping Use   Vaping Use: Never used  Substance and Sexual Activity   Alcohol use: Yes    Comment: Drink every day   Drug use: No   Sexual activity: Not on file  Other Topics Concern   Not on file  Social History Narrative   Not on file   Social Determinants of Health   Financial Resource Strain: Not on file  Food Insecurity: Not on file  Transportation Needs: Not on file  Physical Activity: Not on file  Stress: Not on file  Social Connections: Not on file  Intimate Partner Violence: Not on file    Family History: Family History  Problem Relation Age of Onset   Breast cancer Mother    Colon polyps Father         pre-cancerous   Bladder Cancer Father      Review of Systems: All other systems reviewed and are otherwise negative except as noted above.  Physical Exam: There were no vitals filed for this visit.   GEN- The patient is well appearing, alert and oriented x 3 today.   HEENT: normocephalic, atraumatic; sclera clear, conjunctiva pink; hearing intact; oropharynx clear; neck supple  Lungs- Clear to ausculation bilaterally, normal work of breathing.  No wheezes, rales, rhonchi Heart-  Regular rate and rhythm, no murmurs, rubs or gallops  GI- soft, non-tender, non-distended, bowel sounds present  Extremities- no clubbing or cyanosis. No edema MS- no significant deformity or atrophy Skin- warm and dry, no rash or lesion; PPM pocket well healed Psych- euthymic mood, full affect Neuro- strength and sensation are intact  PPM Interrogation- reviewed in detail today,  See PACEART report  EKG:  EKG {ACTION; IS/IS [id number]} ordered today. Personal review of ekg ordered {Blank single:19197::"today","***"} shows ***   Recent Labs: 05/05/2021: NT-Pro BNP 124 07/25/2021: BUN 17; Creatinine, Ser 1.11; Hemoglobin 15.0; Platelets 128; Potassium 4.3; Sodium 144   Wt Readings from Last 3 Encounters:  08/02/21 178 lb (80.7 kg)  07/25/21 176 lb 6.4 oz (80 kg)  06/01/21 175 lb (79.4 kg)     Other studies Reviewed: Additional studies/ records that were reviewed today include: Previous EP office notes, Previous remote checks, Most recent labwork.   Assessment and Plan:  1. {Blank single:19197::"SND","CHB","Second Degree AV block","Tachy-Brady syndrome","Sick sinus syndrome","Symptomatic bradycardia","Uncontrolled atrial arrhyhtmia s/p AV node ablation"} s/p {Blank single:19197::"Medtronic","St. Jude","[employer]","Biotronik"} PPM  Normal PPM function See Pace Art report No changes today   Current medicines are reviewed at length with the patient today.    Labs/ tests ordered today  include: *** No orders of the defined types were placed in this encounter.    Disposition:   Follow up with {Blank single:19197::"Dr. Allred","Dr. Arlan Organ. Klein","Dr. Camnitz","Dr. Lambert","EP APP"} in {Blank single:19197::"2 weeks","4 weeks","3 months","6 months","12 months","as usual post gen change"}    Signed, Shirley Friar, PA-C  08/18/2021 1:04 PM  [facility] [address] [place] [address] [phone] (office) [phone] (fax)

## 2021-08-19 ENCOUNTER — Encounter: Payer: Medicare Other | Admitting: Student

## 2021-08-25 NOTE — Progress Notes (Unsigned)
Cardiology Office Note   Date:  08/25/2021   ID:  WOODS GANGEMI, DOB Oct 18, 1934, MRN [id number]  PCP:  Crist Infante, MD  Cardiologist:   Markan Cazarez Martinique, MD   No chief complaint on file.     History of Present Illness: Steven Macdonald is a 87 y.o. male who is seen at the request of Dr Gasper Sells for consideration of CTO PCI of the LAD. He is an 87 yo male with history of atrial flutter s/p ablation, cardiomyopathy. chronic diastolic CHF, DM, hyperlipidemia, HTN, LBBB, orthostatic hypotension, sinus node dysfunction s/p pacemaker placement, CAD and PE on lifelong coumadin therapy. He is followed in our office by Dr. Caryl Comes and has also been seen by Dr. Gasper Sells. He is known to have CAD with coronary CTA in June 2019 showing occlusion of the proximal LAD. Minimal disease in the RCA and Circumflex. Echo August 2022 with LVEF=30-35% with global hypokinesis, mild AI. He has been on a beta blocker and Entresto. He is on coumadin therapy given history of PE and atrial flutter. He was seen in Feb by Dr Angelena Form as a work in for evaluation of chest pain.  Bisoprolol was stopped and Imdur 15 mg daily was started.  He was scheduled for cardiac cath with bridging Lovenox. Cardiac cath was performed by Dr Ellyn Hack on 08/02/21. This showed a CTO of the proximal LAD with collaterals. Ranexa was added for therapy and Plavix. Seen today to discuss possibility of CTO PCI.       Past Medical History:  Diagnosis Date   Arthritis    Atrial flutter ([place])    RFCA    BPH (benign prostatic hypertrophy)    Cardiomyopathy, nonischemic -resolved    Echo 2008: EF 25% Cardiac CT 2009: Normal cors EF 31% Echo 3/10 EF 45% Echo 6/13: EF 50-55%     CHF (congestive heart failure) (HCC)    Chicken pox    Colon polyps    hyperplastic   Diabetes mellitus ([place])    ED (erectile dysfunction)    Gilbert's syndrome    possible   Hemorrhoids    Herniated cervical disc    HLD (hyperlipidemia)    HTN (hypertension)     LBBB (left bundle branch block)    Orthostatic hypotension    Pacemaker  Medtronic    DOI 2/06   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[place])    Pulmonary embolism ([place])    Sinus node dysfunction ([place])         Past Surgical History:  Procedure Laterality Date   BIV UPGRADE N/A 08/25/2020   Procedure: BIV PPM UPGRADE;  Surgeon: Deboraha Sprang, MD;  Location: [facility];  Service: Cardiovascular;  Laterality: N/A;   CARDIOVERSION     COLONOSCOPY WITH PROPOFOL N/A 01/20/2021   Procedure: COLONOSCOPY WITH PROPOFOL;  Surgeon: Irene Shipper, MD;  Location: [facility];  Service: Endoscopy;  Laterality: N/A;   coronary ablation     CYSTOSCOPY W/ TRANSURETHRAL RESECTION OF POSTERIOR URETHERAL VALVES     LEFT HEART CATH AND CORONARY ANGIOGRAPHY N/A 08/02/2021   Procedure: LEFT HEART CATH AND CORONARY ANGIOGRAPHY;  Surgeon: Leonie Man, MD;  Location: [facility];  Service: Cardiovascular;  Laterality: N/A;   PACEMAKER GENERATOR CHANGE N/A 05/13/2014   Procedure: PACEMAKER GENERATOR CHANGE;  Surgeon: Deboraha Sprang, MD;  Location: [facility];  Service: Cardiovascular;  Laterality: N/A;   PACEMAKER INSERTION     POLYPECTOMY  01/20/2021   Procedure: POLYPECTOMY;  Surgeon: Irene Shipper, MD;  Location: [facility];  Service: Endoscopy;;     Current Outpatient Medications  Medication Sig Dispense Refill   Calcium Carb-Cholecalciferol (CALCIUM 600+D3) 600-800 MG-UNIT TABS Take 1 tablet by mouth daily.     cholecalciferol (VITAMIN D) 1000 UNITS tablet Take 1,000 Units by mouth daily.     clopidogrel (PLAVIX) 75 MG tablet Take 1 tablet (75 mg total) by mouth daily. 90 tablet 3   Cyanocobalamin (VITAMIN B-12) 2500 MCG SUBL Place 2,500 mcg under the tongue daily.     docusate sodium (COLACE) 100 MG capsule Take 200 mg by mouth daily.     enoxaparin (LOVENOX) 120 MG/0.8ML injection Inject 120 mg into the skin daily.     finasteride (PROSCAR) 5 MG tablet Take 5 mg by mouth every evening.      isosorbide mononitrate (IMDUR) 30 MG 24 hr tablet Take 0.5 tablets (15 mg total) by mouth daily. (Patient taking differently: Take 15 mg by mouth in the morning and at bedtime.) 45 tablet 3   mirtazapine (REMERON) 15 MG tablet Take 15 mg by mouth at bedtime.     mupirocin ointment (BACTROBAN) 2 % Apply 1 application topically 2 (two) times daily.     Omega-3 Fatty Acids (FISH OIL) 1200 MG CAPS Take 2,400 mg by mouth daily.     polyethylene glycol (MIRALAX / GLYCOLAX) 17 g packet Take 17 g by mouth daily.     Probiotic Product (ALIGN PO) Take 4 mg by mouth daily.     PSYLLIUM PO Take 10 mLs by mouth daily.     ranolazine (RANEXA) 500 MG 12 hr tablet Take 1 tablet (500 mg total) by mouth 2 (two) times daily. 30 tablet 4   sacubitril-valsartan (ENTRESTO) 24-26 MG Take 1 tablet by mouth 2 (two) times daily. 180 tablet 1   sildenafil (REVATIO) 20 MG tablet Take 100 mg by mouth daily as needed (erectile dysfunction).     simvastatin (ZOCOR) 40 MG tablet Take 20 mg by mouth 2 (two) times daily.     tamsulosin (FLOMAX) 0.4 MG CAPS capsule Take 0.4 mg by mouth every morning.      triamcinolone cream (KENALOG) 0.1 % Apply 1 application topically 2 (two) times daily.     warfarin (COUMADIN) 5 MG tablet Take 5-7.5 mg by mouth See admin instructions. Take 7.5 mg by mouth daily except for Sunday take 5 mg (Patient not taking: Reported on 07/29/2021)     No current facility-administered medications for this visit.    Allergies:   Patient has no known allergies.    Social History:  The patient  reports that he quit smoking about 50 years ago. His smoking use included cigarettes. He has never used smokeless tobacco. He reports current alcohol use. He reports that he does not use drugs.   Family History:  The patient's ***family history includes Bladder Cancer in his father; Breast cancer in his mother; Colon polyps in his father.    ROS:  Please see the history of present illness.   Otherwise, review of  systems are positive for {NONE DEFAULTED:18576}.   All other systems are reviewed and negative.    PHYSICAL EXAM: VS:  There were no vitals taken for this visit. , BMI There is no height or weight on file to calculate BMI. GEN: Well nourished, well developed, in no acute distress HEENT: normal Neck: no JVD, carotid bruits, or masses Cardiac: ***RRR; no murmurs, rubs, or gallops,no edema  Respiratory:  clear  to auscultation bilaterally, normal work of breathing GI: soft, nontender, nondistended, + BS MS: no deformity or atrophy Skin: warm and dry, no rash Neuro:  Strength and sensation are intact Psych: euthymic mood, full affect   EKG:  EKG {ACTION; IS/IS ERX:[id number]} ordered today. The ekg ordered today demonstrates ***   Recent Labs: 05/05/2021: NT-Pro BNP 124 07/25/2021: BUN 17; Creatinine, Ser 1.11; Hemoglobin 15.0; Platelets 128; Potassium 4.3; Sodium 144    Lipid Panel No results found for: CHOL, TRIG, HDL, CHOLHDL, VLDL, LDLCALC, LDLDIRECT   Dated 10/22/20: cholesterol 135, triglycerides 63, HDL 53, LDL 69.   Wt Readings from Last 3 Encounters:  08/02/21 178 lb (80.7 kg)  07/25/21 176 lb 6.4 oz (80 kg)  06/01/21 175 lb (79.4 kg)      Other studies Reviewed: Additional studies/ records that were reviewed today include:   Coronary CTA 10/24/17: ADDENDUM REPORT: 10/24/2017 17:01   CLINICAL DATA:  Chest pain   EXAM: Cardiac CTA   MEDICATIONS: Sub lingual nitro.  '4mg'$  x 2 and lopressor '5mg'$  IV   TECHNIQUE: The patient was scanned on a Siemens 195 slice scanner. Gantry rotation speed was 250 msecs. Collimation was 0.6 mm. A 100 kV prospective scan was triggered in the ascending thoracic aorta at 35-75% of the R-R interval. Average HR during the scan was 60 bpm. The 3D data set was interpreted on a dedicated work station using MPR, MIP and VRT modes. A total of 80cc of contrast was used.   FINDINGS: Non-cardiac: See separate report from [facility].    Pacemaker lead in right heart.   Calcium Score: 20 Agatston units.   Coronary Arteries: Right dominant with no anomalies   LM: No plaque or stenosis.   LAD system: The proximal LAD appears to be subtotally occluded.   Circumflex system: Small-moderate ramus, no significant disease. No plaque or stenosis in LCx system.   RCA system: Mixed plaque proximal RCA, no significant stenosis.   IMPRESSION: 1. Coronary artery calcium score 20 Agatston units. This places the patient in the 8th percentile for age/gender, suggesting low risk for future cardiac events.   2. The proximal LAD appears subtotally occluded. Will confirm by FFR.   Dalton Mclean     Electronically Signed   By: Loralie Champagne M.D.   On: 10/24/2017 17:01  CLINICAL DATA:  Chest pain   EXAM: CT FFR   MEDICATIONS: No additional medications.   TECHNIQUE: The coronary CTA was sent for CT FFR.   FINDINGS: FFR 0.89 distal RCA FFR 0.86 distal LCx   The proximal LAD appears occluded. There is collateral filling distally.   IMPRESSION: Occluded proximal LAD.   Dalton Mclean     Electronically Signed   By: Loralie Champagne M.D.   On: 10/25/2017 14:32    Echo 02/09/21: IMPRESSIONS     1. Moderate hypertrophy of the basal septum with otherwise mild  concentric LVH. Global hypokinesis worse in the septum. Compared with the  echo 0/9326, systolic function has improved. Left ventricular ejection  fraction, by estimation, is 30 to 35%. The  left ventricle has moderately decreased function. The left ventricle  demonstrates global hypokinesis. There is moderate left ventricular  hypertrophy. Left ventricular diastolic parameters are consistent with  Grade I diastolic dysfunction (impaired  relaxation).   2. Right ventricular systolic function is normal. The right ventricular  size is normal.   3. The mitral valve is normal in structure. Trivial mitral valve  regurgitation. No evidence of mitral stenosis.  4. The aortic valve is normal in structure. Aortic valve regurgitation is  mild. No aortic stenosis is present.   5. Aortic dilatation noted. There is mild dilatation of the aortic root,  measuring 39 mm. There is mild dilatation of the ascending aorta,  measuring 42 mm.   6. The inferior vena cava is normal in size with greater than 50%  respiratory variability, suggesting right atrial pressure of 3 mmHg.   LEFT HEART CATH AND CORONARY ANGIOGRAPHY   Conclusion      Mid LAD lesion is 100% stenosed. ->  Fills via multiple septal perforator and distal PDA collaterals all the way to the occlusion site.  Favorable for CTO PCI.   LV end diastolic pressure is low.   Very tortuous right brachial artery, required versa core wire catheter steering to manipulate   Summary Severe single-vessel CAD with CTO of the proximal to mid LAD with collateral flow filling the majority of the LAD from the RPDA septal perforators. Otherwise angiographically normal coronary arteries with a codominant system.     Recommendations: We will add Plavix after loading 300 mg prior to discharge Also start Ranexa 500 g twice daily He will be discharged home today and plan for staged PCI (CTO PCI) with Dr. Martinique on March 22     Glenetta Hew, MD   Diagnostic Dominance: Co-dominant Interve   ASSESSMENT AND PLAN:  1.  ***   Current medicines are reviewed at length with the patient today.  The patient {ACTIONS; HAS/DOES NOT HAVE:19233} concerns regarding medicines.  The following changes have been made:  {PLAN; NO CHANGE:13088:s}  Labs/ tests ordered today include: *** No orders of the defined types were placed in this encounter.        Disposition:   FU with *** in {gen number 1-66:063016} {Days to years:10300}  Signed, Rikia Sukhu Martinique, MD  08/25/2021 8:24 AM    [facility] [address], [place], [place], [address] Phone [phone], Fax [phone]

## 2021-08-25 NOTE — H&P (View-Only) (Signed)
?  ?Cardiology Office Note ? ? ?Date:  08/31/2021  ? ?ID:  Steven Macdonald, DOB 08-Mar-1935, MRN [id number] ? ?PCP:  Crist Infante, MD  ?Cardiologist:   Khalifa Knecht Martinique, MD  ? ?Chief Complaint  ?Patient presents with  ? Coronary Artery Disease  ? Congestive Heart Failure  ? ? ?  ?History of Present Illness: ?Steven Macdonald is a 87 y.o. male who is seen at the request of Dr Gasper Sells for consideration of CTO PCI of the LAD. He is an 87 yo male with history of atrial flutter s/p ablation, cardiomyopathy. chronic diastolic CHF, DM, hyperlipidemia, HTN, LBBB, orthostatic hypotension, sinus node dysfunction s/p pacemaker placement, CAD and PE on lifelong coumadin therapy. He is followed in our office by Dr. Caryl Comes and has also been seen by Dr. Gasper Sells. He is known to have CAD with coronary CTA in June 2019 showing occlusion of the proximal LAD. Minimal disease in the RCA and Circumflex. Echo August 2022 with LVEF=30-35% with global hypokinesis, mild AI. He has been on a beta blocker and Entresto. He is on coumadin therapy given history of PE and atrial flutter. He was seen in Feb by Dr Angelena Form as a work in for evaluation of chest pain.  Bisoprolol was stopped and Imdur 15 mg daily was started.  He was scheduled for cardiac cath with bridging Lovenox. Cardiac cath was performed by Dr Ellyn Hack on 08/02/21. This showed a CTO of the proximal LAD with collaterals. Ranexa was added for therapy and Plavix. Seen today to discuss possibility of CTO PCI.  ? ?He does note the medication has helped but he still has significant chest pain with exertion. Able to walk only a mile whereas before he was able to walk 3 miles. Some DOE. No palpitations. No bleeding.  ?  ? ? ? ?Past Medical History:  ?Diagnosis Date  ? Arthritis   ? Atrial flutter ([place])   ? RFCA   ? BPH (benign prostatic hypertrophy)   ? Cardiomyopathy, nonischemic -resolved   ? Echo 2008: EF 25% Cardiac CT 2009: Normal cors EF 31% Echo 3/10 EF 45% Echo 6/13: EF 50-55%     ? CHF (congestive heart failure) ([place])   ? Chicken pox   ? Colon polyps   ? hyperplastic  ? Diabetes mellitus ([place])   ? ED (erectile dysfunction)   ? Gilbert's syndrome   ? possible  ? Hemorrhoids   ? Herniated cervical disc   ? HLD (hyperlipidemia)   ? HTN (hypertension)   ? LBBB (left bundle branch block)   ? Orthostatic hypotension   ? Pacemaker  Medtronic   ? DOI 2/06  ? 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[place])   ? Pulmonary embolism ([place])   ? Sinus node dysfunction (HCC)   ?    ? ? ?Past Surgical History:  ?Procedure Laterality Date  ? BIV UPGRADE N/A 08/25/2020  ? Procedure: BIV PPM UPGRADE;  Surgeon: Deboraha Sprang, MD;  Location: [facility];  Service: Cardiovascular;  Laterality: N/A;  ? CARDIOVERSION    ? COLONOSCOPY WITH PROPOFOL N/A 01/20/2021  ? Procedure: COLONOSCOPY WITH PROPOFOL;  Surgeon: Irene Shipper, MD;  Location: [facility];  Service: Endoscopy;  Laterality: N/A;  ? coronary ablation    ? CYSTOSCOPY W/ TRANSURETHRAL RESECTION OF POSTERIOR URETHERAL VALVES    ? LEFT HEART CATH AND CORONARY ANGIOGRAPHY N/A 08/02/2021  ? Procedure: LEFT HEART CATH AND CORONARY ANGIOGRAPHY;  Surgeon: Leonie Man, MD;  Location: [facility];  Service: Cardiovascular;  Laterality: N/A;  ? PACEMAKER GENERATOR CHANGE N/A 05/13/2014  ? Procedure: PACEMAKER GENERATOR CHANGE;  Surgeon: Deboraha Sprang, MD;  Location: [facility] LAB;  Service: Cardiovascular;  Laterality: N/A;  ? PACEMAKER INSERTION    ? POLYPECTOMY  01/20/2021  ? Procedure: POLYPECTOMY;  Surgeon: Irene Shipper, MD;  Location: [facility];  Service: Endoscopy;;  ? ? ? ?Current Outpatient Medications  ?Medication Sig Dispense Refill  ? Calcium Carb-Cholecalciferol (CALCIUM 600+D3) 600-800 MG-UNIT TABS Take 1 tablet by mouth daily.    ? cholecalciferol (VITAMIN D) 1000 UNITS tablet Take 1,000 Units by mouth daily.    ? clopidogrel (PLAVIX) 75 MG tablet Take 1 tablet (75 mg total) by mouth daily. 90 tablet 3  ? Cyanocobalamin (VITAMIN B-12) 2500 MCG SUBL  Place 2,500 mcg under the tongue daily.    ? docusate sodium (COLACE) 100 MG capsule Take 200 mg by mouth daily.    ? doxycycline (VIBRAMYCIN) 100 MG capsule Take 100 mg by mouth daily.    ? enoxaparin (LOVENOX) 120 MG/0.8ML injection Inject 120 mg into the skin daily.    ? Evolocumab (REPATHA SURECLICK) 229 MG/ML SOAJ See admin instructions.    ? finasteride (PROSCAR) 5 MG tablet Take 5 mg by mouth every evening.    ? metoprolol succinate (TOPROL-XL) 25 MG 24 hr tablet Take 12.5 mg by mouth at bedtime.    ? mirtazapine (REMERON) 15 MG tablet Take 15 mg by mouth at bedtime.    ? mupirocin ointment (BACTROBAN) 2 % Apply 1 application topically 2 (two) times daily.    ? nitroGLYCERIN (NITROSTAT) 0.4 MG SL tablet Place 0.4 mg under the tongue.    ? Omega-3 Fatty Acids (FISH OIL) 1200 MG CAPS Take 2,400 mg by mouth daily.    ? polyethylene glycol (MIRALAX / GLYCOLAX) 17 g packet Take 17 g by mouth daily.    ? Probiotic Product (ALIGN PO) Take 4 mg by mouth daily.    ? PSYLLIUM PO Take 10 mLs by mouth daily.    ? ranolazine (RANEXA) 1000 MG SR tablet Take 1,000 mg by mouth 2 (two) times daily.    ? rosuvastatin (CRESTOR) 20 MG tablet Take 20 mg by mouth daily.    ? sacubitril-valsartan (ENTRESTO) 24-26 MG Take 1 tablet by mouth 2 (two) times daily. 180 tablet 1  ? sildenafil (REVATIO) 20 MG tablet Take 100 mg by mouth daily as needed (erectile dysfunction).    ? simvastatin (ZOCOR) 40 MG tablet Take 20 mg by mouth 2 (two) times daily.    ? tamsulosin (FLOMAX) 0.4 MG CAPS capsule Take 0.4 mg by mouth every morning.     ? triamcinolone cream (KENALOG) 0.1 % Apply 1 application topically 2 (two) times daily.    ? warfarin (COUMADIN) 5 MG tablet Take 5-7.5 mg by mouth See admin instructions. Take 7.5 mg by mouth daily except for Sunday take 5 mg    ? isosorbide mononitrate (IMDUR) 60 MG 24 hr tablet Take 1 tablet (60 mg total) by mouth daily.    ? ?No current facility-administered medications for this visit.  ? ? ?Allergies:    Ezetimibe  ? ? ?Social History:  The patient  reports that he quit smoking about 50 years ago. His smoking use included cigarettes. He has never used smokeless tobacco. He reports current alcohol use. He reports that he does not use drugs.  ? ?Family History:  The patient's family history includes Bladder Cancer in his father; Breast  cancer in his mother; Colon polyps in his father.  ? ? ?ROS:  Please see the history of present illness.   Otherwise, review of systems are positive for none.   All other systems are reviewed and negative.  ? ? ?PHYSICAL EXAM: ?VS:  BP (!) 88/60 (BP Location: Right Arm, Cuff Size: Normal)   Pulse 69   Ht '6\' 3"'$  (1.905 m)   Wt 176 lb 3.2 oz (79.9 kg)   SpO2 99%   BMI 22.02 kg/m?  , BMI Body mass index is 22.02 kg/m?. ?GEN: Well nourished, well developed, in no acute distress ?HEENT: normal ?Neck: no JVD, carotid bruits, or masses ?Cardiac: RRR; no murmurs, rubs, or gallops,no edema  ?Respiratory:  clear to auscultation bilaterally, normal work of breathing ?GI: soft, nontender, nondistended, + BS ?MS: no deformity or atrophy ?Skin: warm and dry, no rash ?Neuro:  Strength and sensation are intact ?Psych: euthymic mood, full affect ? ? ?EKG:  EKG is not ordered today. ?The ekg ordered today demonstrates N/A ? ? ?Recent Labs: ?05/05/2021: NT-Pro BNP 124 ?07/25/2021: BUN 17; Creatinine, Ser 1.11; Hemoglobin 15.0; Platelets 128; Potassium 4.3; Sodium 144  ? ? ?Lipid Panel ?No results found for: CHOL, TRIG, HDL, CHOLHDL, VLDL, LDLCALC, LDLDIRECT ?  ?Dated 10/22/20: cholesterol 135, triglycerides 63, HDL 53, LDL 69.  ? ?Wt Readings from Last 3 Encounters:  ?08/31/21 176 lb 3.2 oz (79.9 kg)  ?08/02/21 178 lb (80.7 kg)  ?07/25/21 176 lb 6.4 oz (80 kg)  ?  ? ? ?Other studies Reviewed: ?Additional studies/ records that were reviewed today include:  ? ?Coronary CTA 10/24/17: ADDENDUM REPORT: 10/24/2017 17:01 ?  ?CLINICAL DATA:  Chest pain ?  ?EXAM: ?Cardiac CTA ?  ?MEDICATIONS: ?Sub lingual nitro.   '4mg'$  x 2 and lopressor '5mg'$  IV ?  ?TECHNIQUE: ?The patient was scanned on a Siemens 527 slice scanner. Gantry ?rotation speed was 250 msecs. Collimation was 0.6 mm. A 100 kV ?prospective scan was triggered in

## 2021-08-26 DIAGNOSIS — Z7901 Long term (current) use of anticoagulants: Secondary | ICD-10-CM | POA: Diagnosis not present

## 2021-08-30 DIAGNOSIS — Z7901 Long term (current) use of anticoagulants: Secondary | ICD-10-CM | POA: Diagnosis not present

## 2021-08-31 ENCOUNTER — Other Ambulatory Visit: Payer: Self-pay | Admitting: Cardiology

## 2021-08-31 ENCOUNTER — Other Ambulatory Visit: Payer: Self-pay

## 2021-08-31 ENCOUNTER — Ambulatory Visit (INDEPENDENT_AMBULATORY_CARE_PROVIDER_SITE_OTHER): Payer: Medicare Other | Admitting: Cardiology

## 2021-08-31 ENCOUNTER — Encounter: Payer: Self-pay | Admitting: Cardiology

## 2021-08-31 VITALS — BP 88/60 | HR 69 | Ht 75.0 in | Wt 176.2 lb

## 2021-08-31 DIAGNOSIS — I209 Angina pectoris, unspecified: Secondary | ICD-10-CM

## 2021-08-31 DIAGNOSIS — I447 Left bundle-branch block, unspecified: Secondary | ICD-10-CM | POA: Diagnosis not present

## 2021-08-31 DIAGNOSIS — I5022 Chronic systolic (congestive) heart failure: Secondary | ICD-10-CM | POA: Diagnosis not present

## 2021-08-31 DIAGNOSIS — Z950 Presence of cardiac pacemaker: Secondary | ICD-10-CM | POA: Diagnosis not present

## 2021-08-31 DIAGNOSIS — I25110 Atherosclerotic heart disease of native coronary artery with unstable angina pectoris: Secondary | ICD-10-CM | POA: Diagnosis not present

## 2021-08-31 LAB — LIPID PANEL
Chol/HDL Ratio: 2.4 ratio (ref 0.0–5.0)
Cholesterol, Total: 130 mg/dL (ref 100–199)
HDL: 54 mg/dL (ref >39)
LDL Chol Calc (NIH): 60 mg/dL (ref 0–99)
Triglycerides: 81 mg/dL (ref 0–149)
VLDL Cholesterol Cal: 16 mg/dL (ref 5–40)

## 2021-08-31 MED ORDER — SODIUM CHLORIDE 0.9% FLUSH: 3.0000 mL | Freq: Two times a day (BID) | INTRAVENOUS | Status: DC

## 2021-08-31 MED ORDER — ISOSORBIDE MONONITRATE ER 60 MG PO TB24: 60.0000 mg | ORAL_TABLET | Freq: Every day | ORAL | Status: DC

## 2021-08-31 NOTE — Patient Instructions (Addendum)
?  [facility] ?[place] ?[address] ?[place] [address] ?Dept: [phone] ?Loc: [phone] ? ?JP E[place]HAM  08/31/2021 ? ?You are scheduled for a CTO  on Wednesday 09/07/21, with Dr.Emila Steinhauser. ? ?1. Please arrive at the Main Entrance A at [facility]: [place], [place] [address] at 6:30 am (This time is two hours before your procedure to ensure your preparation). Free valet parking service is available.  ? ?Special note: Every effort is made to have your procedure done on time. Please understand that emergencies sometimes delay scheduled procedures. ? ?2. Diet: Do not eat solid foods after midnight.  You may have clear liquids until 5 AM upon the day of the procedure. ? ?3. Labs: You will need to have blood drawn on Wed 08/31/21 at  [place] office. You do not need to be fasting. ? ?4. Medication instructions in preparation for your procedure: ? ? Change Coumadin to Lovenox bridge as directed ? ? ? ? ? ? ? ?On the morning of your procedure, take Aspirin and Plavix/Clopidogrel and any morning medicines NOT listed above.  You may use sips of water. ? ?5. Plan to go home the same day, you will only stay overnight if medically necessary. ?6. You MUST have a responsible adult to drive you home. ?7. An adult MUST be with you the first 24 hours after you arrive home. ?8. Bring a current list of your medications, and the last time and date medication taken. ?9. Bring ID and current insurance cards. ?10.Please wear clothes that are easy to get on and off and wear slip-on shoes. ? ?Thank you for allowing [place] to care for you! ?  -- [facility] ? ?

## 2021-09-01 ENCOUNTER — Ambulatory Visit: Payer: Medicare Other | Admitting: Nurse Practitioner

## 2021-09-01 LAB — BASIC METABOLIC PANEL
BUN/Creatinine Ratio: 15 (ref 10–24)
BUN: 19 mg/dL (ref 8–27)
CO2: 27 mmol/L (ref 20–29)
Calcium: 9.5 mg/dL (ref 8.6–10.2)
Chloride: 103 mmol/L (ref 96–106)
Creatinine, Ser: 1.27 mg/dL (ref 0.76–1.27)
Glucose: 80 mg/dL (ref 70–99)
Potassium: 4.6 mmol/L (ref 3.5–5.2)
Sodium: 141 mmol/L (ref 134–144)
eGFR: 56 mL/min/{1.73_m2} — ABNORMAL LOW (ref >59)

## 2021-09-01 LAB — CBC WITH DIFFERENTIAL/PLATELET
Basophils Absolute: 0.1 10*3/uL (ref 0.0–0.2)
Basos: 1 %
EOS (ABSOLUTE): 0.3 10*3/uL (ref 0.0–0.4)
Eos: 5 %
Hematocrit: 43.2 % (ref 37.5–51.0)
Hemoglobin: 14.4 g/dL (ref 13.0–17.7)
Immature Grans (Abs): 0 10*3/uL (ref 0.0–0.1)
Immature Granulocytes: 0 %
Lymphocytes Absolute: 2.9 10*3/uL (ref 0.7–3.1)
Lymphs: 48 %
MCH: 28.9 pg (ref 26.6–33.0)
MCHC: 33.3 g/dL (ref 31.5–35.7)
MCV: 87 fL (ref 79–97)
Monocytes Absolute: 0.5 10*3/uL (ref 0.1–0.9)
Monocytes: 9 %
Neutrophils Absolute: 2.2 10*3/uL (ref 1.4–7.0)
Neutrophils: 37 %
Platelets: 109 10*3/uL — ABNORMAL LOW (ref 150–450)
RBC: 4.98 x10E6/uL (ref 4.14–5.80)
RDW: 15.2 % (ref 11.6–15.4)
WBC: 6.1 10*3/uL (ref 3.4–10.8)

## 2021-09-01 LAB — PT AND PTT
INR: 1.6 — ABNORMAL HIGH (ref 0.9–1.2)
Prothrombin Time: 16 s — ABNORMAL HIGH (ref 9.1–12.0)
aPTT: 29 s (ref 24–33)

## 2021-09-05 ENCOUNTER — Ambulatory Visit (INDEPENDENT_AMBULATORY_CARE_PROVIDER_SITE_OTHER): Payer: Medicare Other

## 2021-09-05 ENCOUNTER — Telehealth: Payer: Self-pay | Admitting: *Deleted

## 2021-09-05 DIAGNOSIS — I428 Other cardiomyopathies: Secondary | ICD-10-CM | POA: Diagnosis not present

## 2021-09-05 NOTE — Telephone Encounter (Signed)
CTO scheduled at [facility] for: Wednesday September 07, 2021 8:30 AM ?Arrival time and place: [place] Entrance A at: 6:30 AM ? ? ?No solid food after midnight prior to cath, clear liquids until 5 AM day of procedure. ? ?Medication instructions: ?-Hold: ?  Warfarin/Lovenox-Patient reports instructions from Dr Joylene Draft -last warfarin dose 09/01/21/Lovenox bridge. ? Entresto-PM prior/AM of procedure-per protocol GFR 56 ?-Except hold medications usual morning medications can be taken with sips of water including aspirin 81 mg and Plavix 75 mg. ? ?Confirmed patient has responsible adult to drive home post procedure and be with patient first 24 hours after arriving home: ? ?One visitor is allowed to stay in the waiting room during the time you are at the hospital for your procedure.  ? ?Patient reports no symptoms concerning for COVID-19/no exposure to COVID-19 in the past 10 days. ? ?Reviewed procedure instructions with patient. ? ?

## 2021-09-06 LAB — CUP PACEART REMOTE DEVICE CHECK
Battery Remaining Longevity: 88 mo
Battery Voltage: 2.99 V
Brady Statistic AP VP Percent: 88.48 %
Brady Statistic AP VS Percent: 0.58 %
Brady Statistic AS VP Percent: 7.13 %
Brady Statistic AS VS Percent: 3.81 %
Brady Statistic RA Percent Paced: 90.16 %
Brady Statistic RV Percent Paced: 95.6 %
Date Time Interrogation Session: 20230319233707
Implantable Lead Implant Date: 20060223
Implantable Lead Implant Date: 20060223
Implantable Lead Implant Date: 20220309
Implantable Lead Location: 753858
Implantable Lead Location: 753859
Implantable Lead Location: 753860
Implantable Lead Model: 4598
Implantable Lead Model: 5076
Implantable Lead Model: 5076
Implantable Pulse Generator Implant Date: 20220309
Lead Channel Impedance Value: 342 Ohm
Lead Channel Impedance Value: 380 Ohm
Lead Channel Impedance Value: 380 Ohm
Lead Channel Impedance Value: 399 Ohm
Lead Channel Impedance Value: 456 Ohm
Lead Channel Impedance Value: 513 Ohm
Lead Channel Impedance Value: 532 Ohm
Lead Channel Impedance Value: 589 Ohm
Lead Channel Impedance Value: 646 Ohm
Lead Channel Impedance Value: 646 Ohm
Lead Channel Impedance Value: 684 Ohm
Lead Channel Impedance Value: 950 Ohm
Lead Channel Impedance Value: 950 Ohm
Lead Channel Impedance Value: 950 Ohm
Lead Channel Pacing Threshold Amplitude: 0.625 V
Lead Channel Pacing Threshold Amplitude: 0.625 V
Lead Channel Pacing Threshold Amplitude: 1.875 V
Lead Channel Pacing Threshold Pulse Width: 0.4 ms
Lead Channel Pacing Threshold Pulse Width: 0.4 ms
Lead Channel Pacing Threshold Pulse Width: 1.2 ms
Lead Channel Sensing Intrinsic Amplitude: 2.875 mV
Lead Channel Sensing Intrinsic Amplitude: 2.875 mV
Lead Channel Sensing Intrinsic Amplitude: 5.875 mV
Lead Channel Sensing Intrinsic Amplitude: 5.875 mV
Lead Channel Setting Pacing Amplitude: 1.5 V
Lead Channel Setting Pacing Amplitude: 2.5 V
Lead Channel Setting Pacing Amplitude: 2.5 V
Lead Channel Setting Pacing Pulse Width: 0.4 ms
Lead Channel Setting Pacing Pulse Width: 1.2 ms
Lead Channel Setting Sensing Sensitivity: 1.2 mV

## 2021-09-07 ENCOUNTER — Other Ambulatory Visit: Payer: Self-pay

## 2021-09-07 ENCOUNTER — Other Ambulatory Visit (HOSPITAL_COMMUNITY): Payer: Self-pay

## 2021-09-07 ENCOUNTER — Inpatient Hospital Stay (HOSPITAL_COMMUNITY)
Admission: RE | Admit: 2021-09-07 | Discharge: 2021-09-09 | DRG: 246 | Disposition: A | Discharge status: Home / Self Care | Payer: Medicare Other | Attending: Cardiology | Admitting: Cardiology

## 2021-09-07 ENCOUNTER — Encounter (HOSPITAL_COMMUNITY)
Admission: RE | Disposition: A | Discharge status: Home / Self Care | Payer: Self-pay | Source: Home / Self Care | Attending: Cardiology

## 2021-09-07 ENCOUNTER — Encounter (HOSPITAL_COMMUNITY): Payer: Self-pay | Admitting: Cardiology

## 2021-09-07 DIAGNOSIS — K661 Hemoperitoneum: Secondary | ICD-10-CM | POA: Diagnosis not present

## 2021-09-07 DIAGNOSIS — I5042 Chronic combined systolic (congestive) and diastolic (congestive) heart failure: Secondary | ICD-10-CM | POA: Diagnosis not present

## 2021-09-07 DIAGNOSIS — I495 Sick sinus syndrome: Secondary | ICD-10-CM | POA: Diagnosis present

## 2021-09-07 DIAGNOSIS — I447 Left bundle-branch block, unspecified: Secondary | ICD-10-CM | POA: Diagnosis not present

## 2021-09-07 DIAGNOSIS — I480 Paroxysmal atrial fibrillation: Secondary | ICD-10-CM | POA: Diagnosis present

## 2021-09-07 DIAGNOSIS — I2510 Atherosclerotic heart disease of native coronary artery without angina pectoris: Principal | ICD-10-CM | POA: Diagnosis present

## 2021-09-07 DIAGNOSIS — Z87891 Personal history of nicotine dependence: Secondary | ICD-10-CM

## 2021-09-07 DIAGNOSIS — I25119 Atherosclerotic heart disease of native coronary artery with unspecified angina pectoris: Secondary | ICD-10-CM | POA: Diagnosis not present

## 2021-09-07 DIAGNOSIS — Z7901 Long term (current) use of anticoagulants: Secondary | ICD-10-CM | POA: Diagnosis not present

## 2021-09-07 DIAGNOSIS — Z888 Allergy status to other drugs, medicaments and biological substances status: Secondary | ICD-10-CM | POA: Diagnosis not present

## 2021-09-07 DIAGNOSIS — E785 Hyperlipidemia, unspecified: Secondary | ICD-10-CM

## 2021-09-07 DIAGNOSIS — I4892 Unspecified atrial flutter: Secondary | ICD-10-CM | POA: Diagnosis not present

## 2021-09-07 DIAGNOSIS — Z950 Presence of cardiac pacemaker: Secondary | ICD-10-CM | POA: Diagnosis present

## 2021-09-07 DIAGNOSIS — I428 Other cardiomyopathies: Secondary | ICD-10-CM | POA: Diagnosis present

## 2021-09-07 DIAGNOSIS — I2582 Chronic total occlusion of coronary artery: Secondary | ICD-10-CM | POA: Diagnosis not present

## 2021-09-07 DIAGNOSIS — Z79899 Other long term (current) drug therapy: Secondary | ICD-10-CM

## 2021-09-07 DIAGNOSIS — N400 Enlarged prostate without lower urinary tract symptoms: Secondary | ICD-10-CM | POA: Diagnosis not present

## 2021-09-07 DIAGNOSIS — K683 Retroperitoneal hematoma: Secondary | ICD-10-CM

## 2021-09-07 DIAGNOSIS — I110 Hypertensive heart disease with heart failure: Secondary | ICD-10-CM | POA: Diagnosis not present

## 2021-09-07 DIAGNOSIS — I255 Ischemic cardiomyopathy: Secondary | ICD-10-CM | POA: Diagnosis present

## 2021-09-07 DIAGNOSIS — Z7902 Long term (current) use of antithrombotics/antiplatelets: Secondary | ICD-10-CM | POA: Diagnosis not present

## 2021-09-07 DIAGNOSIS — N281 Cyst of kidney, acquired: Secondary | ICD-10-CM | POA: Diagnosis not present

## 2021-09-07 DIAGNOSIS — R58 Hemorrhage, not elsewhere classified: Secondary | ICD-10-CM

## 2021-09-07 DIAGNOSIS — Z95810 Presence of automatic (implantable) cardiac defibrillator: Secondary | ICD-10-CM | POA: Diagnosis not present

## 2021-09-07 DIAGNOSIS — Z955 Presence of coronary angioplasty implant and graft: Principal | ICD-10-CM

## 2021-09-07 DIAGNOSIS — I2699 Other pulmonary embolism without acute cor pulmonale: Secondary | ICD-10-CM | POA: Diagnosis present

## 2021-09-07 DIAGNOSIS — I209 Angina pectoris, unspecified: Secondary | ICD-10-CM

## 2021-09-07 DIAGNOSIS — Z86711 Personal history of pulmonary embolism: Secondary | ICD-10-CM | POA: Diagnosis not present

## 2021-09-07 DIAGNOSIS — I9581 Postprocedural hypotension: Secondary | ICD-10-CM | POA: Diagnosis not present

## 2021-09-07 DIAGNOSIS — R710 Precipitous drop in hematocrit: Secondary | ICD-10-CM | POA: Diagnosis not present

## 2021-09-07 DIAGNOSIS — I959 Hypotension, unspecified: Secondary | ICD-10-CM

## 2021-09-07 DIAGNOSIS — I10 Essential (primary) hypertension: Secondary | ICD-10-CM | POA: Diagnosis present

## 2021-09-07 DIAGNOSIS — E119 Type 2 diabetes mellitus without complications: Secondary | ICD-10-CM | POA: Diagnosis present

## 2021-09-07 DIAGNOSIS — I5022 Chronic systolic (congestive) heart failure: Secondary | ICD-10-CM | POA: Diagnosis present

## 2021-09-07 HISTORY — PX: CORONARY ULTRASOUND/IVUS: CATH118244

## 2021-09-07 HISTORY — PX: CORONARY CTO INTERVENTION: CATH118236

## 2021-09-07 HISTORY — PX: INTRAVASCULAR ULTRASOUND/IVUS: CATH118244

## 2021-09-07 LAB — POCT ACTIVATED CLOTTING TIME
Activated Clotting Time: 275 seconds
Activated Clotting Time: 335 seconds
Activated Clotting Time: 239 seconds
Activated Clotting Time: 299 seconds
Activated Clotting Time: 179 seconds
Activated Clotting Time: 191 seconds

## 2021-09-07 LAB — CBC
HCT: 37.2 % — ABNORMAL LOW (ref 39.0–52.0)
Hemoglobin: 12.4 g/dL — ABNORMAL LOW (ref 13.0–17.0)
MCH: 29.4 pg (ref 26.0–34.0)
MCHC: 33.3 g/dL (ref 30.0–36.0)
MCV: 88.2 fL (ref 80.0–100.0)
Platelets: 107 10*3/uL — ABNORMAL LOW (ref 150–400)
RBC: 4.22 MIL/uL (ref 4.22–5.81)
RDW: 14.9 % (ref 11.5–15.5)
WBC: 8.1 10*3/uL (ref 4.0–10.5)
nRBC: 0 % (ref 0.0–0.2)

## 2021-09-07 LAB — TYPE AND SCREEN
ABO/RH(D): O POS
Antibody Screen: NEGATIVE

## 2021-09-07 LAB — PROTIME-INR
INR: 1.1 (ref 0.8–1.2)
Prothrombin Time: 14.6 seconds (ref 11.4–15.2)

## 2021-09-07 LAB — ABO/RH: ABO/RH(D): O POS

## 2021-09-07 SURGERY — CORONARY CTO INTERVENTION
Anesthesia: LOCAL

## 2021-09-07 MED ORDER — WARFARIN SODIUM 7.5 MG PO TABS
7.5000 mg | ORAL_TABLET | Freq: Once | ORAL | Status: AC
  Administered 2021-09-07: 7.5 mg via ORAL
  Filled 2021-09-07: qty 1

## 2021-09-07 MED ORDER — SODIUM CHLORIDE 0.9 % IV SOLN: 250.0000 mL | INTRAVENOUS | Status: DC | PRN

## 2021-09-07 MED ORDER — ASPIRIN EC 81 MG PO TBEC
81.0000 mg | DELAYED_RELEASE_TABLET | Freq: Every day | ORAL | Status: DC
  Administered 2021-09-08: 81 mg via ORAL
  Filled 2021-09-07: qty 1

## 2021-09-07 MED ORDER — SODIUM CHLORIDE 0.9% FLUSH: 3.0000 mL | INTRAVENOUS | Status: DC | PRN

## 2021-09-07 MED ORDER — SODIUM CHLORIDE 0.9% FLUSH
3.0000 mL | Freq: Two times a day (BID) | INTRAVENOUS | Status: DC
  Administered 2021-09-07 – 2021-09-08 (×4): 3 mL via INTRAVENOUS

## 2021-09-07 MED ORDER — MIRTAZAPINE 15 MG PO TABS
15.0000 mg | ORAL_TABLET | Freq: Every day | ORAL | Status: DC
  Administered 2021-09-07 – 2021-09-08 (×2): 15 mg via ORAL
  Filled 2021-09-07 (×2): qty 1

## 2021-09-07 MED ORDER — SODIUM CHLORIDE 0.9 % WEIGHT BASED INFUSION
1.0000 mL/kg/h | INTRAVENOUS | Status: DC
  Administered 2021-09-07: 300 mL via INTRAVENOUS

## 2021-09-07 MED ORDER — HEPARIN (PORCINE) IN NACL 1000-0.9 UT/500ML-% IV SOLN
INTRAVENOUS | Status: AC
  Filled 2021-09-07: qty 500

## 2021-09-07 MED ORDER — FINASTERIDE 5 MG PO TABS
5.0000 mg | ORAL_TABLET | Freq: Every evening | ORAL | Status: DC
  Administered 2021-09-07 – 2021-09-08 (×2): 5 mg via ORAL
  Filled 2021-09-07 (×2): qty 1

## 2021-09-07 MED ORDER — FENTANYL CITRATE (PF) 100 MCG/2ML IJ SOLN
INTRAMUSCULAR | Status: DC | PRN
  Administered 2021-09-07: 25 ug via INTRAVENOUS

## 2021-09-07 MED ORDER — HEPARIN SODIUM (PORCINE) 1000 UNIT/ML IJ SOLN
INTRAMUSCULAR | Status: AC
  Filled 2021-09-07: qty 10

## 2021-09-07 MED ORDER — MIDAZOLAM HCL 2 MG/2ML IJ SOLN
INTRAMUSCULAR | Status: AC
  Filled 2021-09-07: qty 2

## 2021-09-07 MED ORDER — FENTANYL CITRATE (PF) 100 MCG/2ML IJ SOLN
INTRAMUSCULAR | Status: AC
  Filled 2021-09-07: qty 2

## 2021-09-07 MED ORDER — TAMSULOSIN HCL 0.4 MG PO CAPS
0.4000 mg | ORAL_CAPSULE | Freq: Every morning | ORAL | Status: DC
  Administered 2021-09-08 – 2021-09-09 (×2): 0.4 mg via ORAL
  Filled 2021-09-07 (×2): qty 1

## 2021-09-07 MED ORDER — NITROGLYCERIN 1 MG/10 ML FOR IR/CATH LAB
INTRA_ARTERIAL | Status: AC
  Filled 2021-09-07: qty 10

## 2021-09-07 MED ORDER — IOHEXOL 350 MG/ML SOLN
INTRAVENOUS | Status: DC | PRN
  Administered 2021-09-07: 180 mL

## 2021-09-07 MED ORDER — NITROGLYCERIN 0.4 MG SL SUBL: 0.4000 mg | SUBLINGUAL_TABLET | SUBLINGUAL | Status: DC | PRN

## 2021-09-07 MED ORDER — LIDOCAINE HCL (PF) 1 % IJ SOLN
INTRAMUSCULAR | Status: DC | PRN
Start: 2021-09-07 — End: 2021-09-07
  Administered 2021-09-07 (×2): 10 mL via INTRADERMAL

## 2021-09-07 MED ORDER — NITROGLYCERIN 1 MG/10 ML FOR IR/CATH LAB
INTRA_ARTERIAL | Status: DC | PRN
  Administered 2021-09-07 (×2): 200 ug via INTRACORONARY

## 2021-09-07 MED ORDER — DOXYCYCLINE HYCLATE 100 MG PO TABS
100.0000 mg | ORAL_TABLET | Freq: Every day | ORAL | Status: DC
  Administered 2021-09-07 – 2021-09-08 (×2): 100 mg via ORAL
  Filled 2021-09-07 (×2): qty 1

## 2021-09-07 MED ORDER — ASPIRIN 81 MG PO CHEW: 81.0000 mg | CHEWABLE_TABLET | ORAL | Status: DC

## 2021-09-07 MED ORDER — ASPIRIN EC 81 MG PO TBEC: 81.0000 mg | DELAYED_RELEASE_TABLET | Freq: Once | ORAL | Status: DC

## 2021-09-07 MED ORDER — MIDAZOLAM HCL 2 MG/2ML IJ SOLN
INTRAMUSCULAR | Status: DC | PRN
Start: 2021-09-07 — End: 2021-09-07
  Administered 2021-09-07: 2 mg via INTRAVENOUS

## 2021-09-07 MED ORDER — METOPROLOL SUCCINATE ER 25 MG PO TB24
12.5000 mg | ORAL_TABLET | Freq: Every day | ORAL | Status: DC
Start: 2021-09-07 — End: 2021-09-09
  Administered 2021-09-07: 12.5 mg via ORAL
  Filled 2021-09-07 (×2): qty 1

## 2021-09-07 MED ORDER — SODIUM CHLORIDE 0.9 % IV SOLN: INTRAVENOUS | Status: AC

## 2021-09-07 MED ORDER — HEPARIN SODIUM (PORCINE) 1000 UNIT/ML IJ SOLN
INTRAMUSCULAR | Status: DC | PRN
  Administered 2021-09-07: 9000 [IU] via INTRAVENOUS
  Administered 2021-09-07: 2000 [IU] via INTRAVENOUS

## 2021-09-07 MED ORDER — HEPARIN (PORCINE) IN NACL 1000-0.9 UT/500ML-% IV SOLN
INTRAVENOUS | Status: DC | PRN
  Administered 2021-09-07 (×3): 500 mL

## 2021-09-07 MED ORDER — CLOPIDOGREL BISULFATE 75 MG PO TABS
75.0000 mg | ORAL_TABLET | Freq: Every day | ORAL | Status: DC
  Administered 2021-09-08: 75 mg via ORAL
  Filled 2021-09-07: qty 1

## 2021-09-07 MED ORDER — POLYETHYLENE GLYCOL 3350 17 G PO PACK
17.0000 g | PACK | Freq: Every day | ORAL | Status: DC
  Administered 2021-09-08 – 2021-09-09 (×2): 17 g via ORAL
  Filled 2021-09-07 (×2): qty 1

## 2021-09-07 MED ORDER — SILDENAFIL CITRATE 20 MG PO TABS: 100.0000 mg | ORAL_TABLET | Freq: Every day | ORAL | Status: DC | PRN

## 2021-09-07 MED ORDER — RANOLAZINE ER 500 MG PO TB12
1000.0000 mg | ORAL_TABLET | Freq: Two times a day (BID) | ORAL | Status: DC
  Administered 2021-09-07 – 2021-09-09 (×4): 1000 mg via ORAL
  Filled 2021-09-07 (×4): qty 2

## 2021-09-07 MED ORDER — WARFARIN - PHARMACIST DOSING INPATIENT
Freq: Every day | Status: DC
Start: 2021-09-07 — End: 2021-09-08

## 2021-09-07 MED ORDER — LIDOCAINE HCL (PF) 1 % IJ SOLN
INTRAMUSCULAR | Status: AC
  Filled 2021-09-07: qty 30

## 2021-09-07 MED ORDER — ROSUVASTATIN CALCIUM 20 MG PO TABS
20.0000 mg | ORAL_TABLET | Freq: Every day | ORAL | Status: DC
  Administered 2021-09-07 – 2021-09-09 (×3): 20 mg via ORAL
  Filled 2021-09-07 (×3): qty 1

## 2021-09-07 MED ORDER — SODIUM CHLORIDE 0.9 % WEIGHT BASED INFUSION
3.0000 mL/kg/h | INTRAVENOUS | Status: DC
  Administered 2021-09-07: 3 mL/kg/h via INTRAVENOUS

## 2021-09-07 MED ORDER — ENOXAPARIN SODIUM 120 MG/0.8ML IJ SOSY: 120.0000 mg | PREFILLED_SYRINGE | Freq: Every day | INTRAMUSCULAR | Status: DC

## 2021-09-07 MED ORDER — SACUBITRIL-VALSARTAN 24-26 MG PO TABS
1.0000 | ORAL_TABLET | Freq: Two times a day (BID) | ORAL | Status: DC
  Administered 2021-09-07 – 2021-09-08 (×2): 1 via ORAL
  Filled 2021-09-07 (×2): qty 1

## 2021-09-07 MED ORDER — DOCUSATE SODIUM 100 MG PO CAPS
200.0000 mg | ORAL_CAPSULE | Freq: Every day | ORAL | Status: DC
  Administered 2021-09-08: 200 mg via ORAL
  Filled 2021-09-07: qty 2

## 2021-09-07 SURGICAL SUPPLY — 25 items
BALL SAPPHIRE NC24 3.5X22 (BALLOONS) ×2
BALLN SAPPHIRE 2.0X20 (BALLOONS) ×2
BALLOON SAPPHIRE 2.0X20 (BALLOONS) IMPLANT
BALLOON SAPPHIRE NC24 3.5X22 (BALLOONS) IMPLANT
CATH INFINITI JR4 5F (CATHETERS) ×1 IMPLANT
CATH MACH1 8F CLS4 (CATHETERS) ×1 IMPLANT
CATH MAMBA 135 (CATHETERS) ×1 IMPLANT
CATH OPTICROSS HD (CATHETERS) ×1 IMPLANT
CATH TRAPPER 6-8F (CATHETERS) ×1 IMPLANT
ELECT DEFIB PAD ADLT CADENCE (PAD) ×1 IMPLANT
KIT ENCORE 26 ADVANTAGE (KITS) ×1 IMPLANT
KIT HEART LEFT (KITS) ×3 IMPLANT
PACK CARDIAC CATHETERIZATION (CUSTOM PROCEDURE TRAY) ×2 IMPLANT
SHEATH BRITE TIP 8FR 35CM (SHEATH) ×1 IMPLANT
SHEATH PINNACLE 5F 10CM (SHEATH) ×1 IMPLANT
SHEATH PROBE COVER 6X72 (BAG) ×1 IMPLANT
SLED PULL BACK IVUS (MISCELLANEOUS) ×1 IMPLANT
STENT SYNERGY XD 3.0X48 (Permanent Stent) IMPLANT
SYNERGY XD 3.0X48 (Permanent Stent) ×2 IMPLANT
TRANSDUCER W/STOPCOCK (MISCELLANEOUS) ×3 IMPLANT
TUBING CIL FLEX 10 FLL-RA (TUBING) ×3 IMPLANT
VALVE COPILOT STAT (MISCELLANEOUS) ×1 IMPLANT
WIRE ASAHI PROWATER 180CM (WIRE) ×1 IMPLANT
WIRE EMERALD 3MM-J .035X150CM (WIRE) ×1 IMPLANT
WIRE FIGHTER CROSSING 190CM (WIRE) ×1 IMPLANT

## 2021-09-07 NOTE — Progress Notes (Signed)
Received patient from cath lab via stretcher.  Patient is alert and oriented x 4.  Right groin dressing with gauze and tegaderm CDI, level 1.  Left groin with gauze and tegaderm CDI, level 0.  Patient verbalized good sensation to BLE.  Denies pain at this time, reiterated about bed rest until 1630H.  Patient's wife at bedside, both oriented to room and unit routine, call bell within reach.  Needs addressed. ?

## 2021-09-07 NOTE — Plan of Care (Signed)

## 2021-09-07 NOTE — Progress Notes (Addendum)
Site area: left groin arterial sheath ?Site Prior to Removal:  Level 0 ?Pressure Applied For: 30 minutes ?Manual:   yes ?Patient Status During Pull:  BP dropped low 60's, asympomatic; 250cc fluid bolus started, HOB down. PA  Nunzio Cory at bedside  ?Post Pull Site:  Level 0 ?Post Pull Instructions Given:  yes  ?Post Pull Pulses Present: left pt palpable ?Dressing Applied:  gauze AND tegaderm ?Bedrest begins @  ?Comments: ?  ?

## 2021-09-07 NOTE — Progress Notes (Signed)
Site area: rt groin arterial sheath pulled by Milicent Sloan ?Site Prior to Removal:  Level 1 hematoma below insertion site ?Pressure Applied For: 30 minutes ?Manual:   yes ?Patient Status During Pull:  stable ?Post Pull Site:  Level 1, hematoma puffy w/o ridge ?Post Pull Instructions Given:  yes ?Post Pull Pulses Present: rt pt palpable ?Dressing Applied:  gauze and tegaderm ?Bedrest begins @ 1240 ?Comments: ?  ?

## 2021-09-07 NOTE — Progress Notes (Addendum)
ANTICOAGULATION CONSULT NOTE - Initial Consult ? ?Pharmacy Consult for warfarin ?Indication:  history of PE on lifelong anticoagulation ? ?Allergies  ?Allergen Reactions  ? Ezetimibe Other (See Comments)  ?  Unknown  ? ? ?Patient Measurements: ?Height: '6\' 3"'$  (190.5 cm) ?Weight: 79.9 kg (176 lb 2.4 oz) ?IBW/kg (Calculated) : 84.5 ? ? ?Vital Signs: ?Temp: 97.7 ?F (36.5 ?C) (03/22 1359) ?Temp Source: Oral (03/22 1359) ?BP: 108/70 (03/22 1359) ?Pulse Rate: 86 (03/22 1359) ? ?Labs: ?Recent Labs  ?  09/07/21 ?5916  ?LABPROT 14.6  ?INR 1.1  ? ? ?Estimated Creatinine Clearance: 48.9 mL/min (by C-G formula based on SCr of 1.27 mg/dL). ? ? ?Medical History: ?Past Medical History:  ?Diagnosis Date  ? Arthritis   ? Atrial flutter ([place])   ? RFCA   ? BPH (benign prostatic hypertrophy)   ? Cardiomyopathy, nonischemic -resolved   ? Echo 2008: EF 25% Cardiac CT 2009: Normal cors EF 31% Echo 3/10 EF 45% Echo 6/13: EF 50-55%    ? CHF (congestive heart failure) ([place])   ? Chicken pox   ? Colon polyps   ? hyperplastic  ? Diabetes mellitus ([place])   ? ED (erectile dysfunction)   ? Gilbert's syndrome   ? possible  ? Hemorrhoids   ? Herniated cervical disc   ? HLD (hyperlipidemia)   ? HTN (hypertension)   ? LBBB (left bundle branch block)   ? Orthostatic hypotension   ? Pacemaker  Medtronic   ? DOI 2/06  ? 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 Pulmonary embolism ([place])   ? Sinus node dysfunction (HCC)   ?    ? ?Assessment: ?87 year old male on lifelong anticoagulation with coumadin for history of pulmonary embolus. INR normal as he has been holding his warfarin for catheter procedure today. Patient was prescribed to be on a lovenox bridge prior to procedure but medication history indicates he did not start this. Last dose of warfarin was reported as 3/16.  ? ?**correction - patient was taking lovenox as prescribed, with last dose 3/21 prior to procedure.  ? ?Prior to admission warfarin regimen was 7.'5mg'$  daily except '5mg'$  on MWF.  ? ?Goal of Therapy:   ?INR 2-3 ?Monitor platelets by anticoagulation protocol: Yes ?  ?Plan:  ?Warfarin 7.'5mg'$  x1 tonight ?Daily INR ? ?Erin Hearing PharmD., BCPS ?Clinical Pharmacist ?09/07/2021 2:12 PM ? ?

## 2021-09-07 NOTE — Interval H&P Note (Signed)
History and Physical Interval Note: ? ?09/07/2021 ?7:18 AM ? ?Steven Macdonald  has presented today for surgery, with the diagnosis of cto.  The various methods of treatment have been discussed with the patient and family. After consideration of risks, benefits and other options for treatment, the patient has consented to  Procedure(s): ?CORONARY CTO INTERVENTION (N/A) as a surgical intervention.  The patient's history has been reviewed, patient examined, no change in status, stable for surgery.  I have reviewed the patient's chart and labs.  Questions were answered to the patient's satisfaction.   ? ?Cath Lab Visit (complete for each Cath Lab visit) ? ?Clinical Evaluation Leading to the Procedure:  ? ?ACS: No. ? ?Non-ACS:   ? ?Anginal Classification: CCS III ? ?Anti-ischemic medical therapy: Maximal Therapy (2 or more classes of medications) ? ?Non-Invasive Test Results: No non-invasive testing performed ? ?Prior CABG: No previous CABG ? ? ? ? ? ? ?Steven Macdonald [facility] ?09/07/2021 ?7:18 AM ? ? ? ?

## 2021-09-07 NOTE — TOC Benefit Eligibility Note (Signed)
Patient Advocate Encounter ? ?Insurance verification completed.   ? ?The patient is currently admitted and upon discharge could be taking Eliquis 5 mg. ? ?The current 30 day co-pay is, $38.00.  ? ?The patient is insured through [employer] ([facility] is the only [facility] pharmacy that takes [employer])  ? ? ? ?Lyndel Safe, CPhT ?Pharmacy Patient Advocate Specialist ?[facility] Patient Advocate Team ?Direct Number: [phone]  Fax: [phone] ? ? ? ? ? ?  ?

## 2021-09-08 ENCOUNTER — Ambulatory Visit (HOSPITAL_COMMUNITY): Payer: Medicare Other

## 2021-09-08 DIAGNOSIS — I2510 Atherosclerotic heart disease of native coronary artery without angina pectoris: Secondary | ICD-10-CM

## 2021-09-08 DIAGNOSIS — I495 Sick sinus syndrome: Secondary | ICD-10-CM | POA: Diagnosis present

## 2021-09-08 DIAGNOSIS — Z87891 Personal history of nicotine dependence: Secondary | ICD-10-CM | POA: Diagnosis not present

## 2021-09-08 DIAGNOSIS — N400 Enlarged prostate without lower urinary tract symptoms: Secondary | ICD-10-CM | POA: Diagnosis present

## 2021-09-08 DIAGNOSIS — Z79899 Other long term (current) drug therapy: Secondary | ICD-10-CM | POA: Diagnosis not present

## 2021-09-08 DIAGNOSIS — I480 Paroxysmal atrial fibrillation: Secondary | ICD-10-CM | POA: Diagnosis present

## 2021-09-08 DIAGNOSIS — I9581 Postprocedural hypotension: Secondary | ICD-10-CM | POA: Diagnosis not present

## 2021-09-08 DIAGNOSIS — K661 Hemoperitoneum: Secondary | ICD-10-CM | POA: Diagnosis not present

## 2021-09-08 DIAGNOSIS — Z86711 Personal history of pulmonary embolism: Secondary | ICD-10-CM | POA: Diagnosis not present

## 2021-09-08 DIAGNOSIS — E785 Hyperlipidemia, unspecified: Secondary | ICD-10-CM | POA: Diagnosis present

## 2021-09-08 DIAGNOSIS — Z7902 Long term (current) use of antithrombotics/antiplatelets: Secondary | ICD-10-CM | POA: Diagnosis not present

## 2021-09-08 DIAGNOSIS — I110 Hypertensive heart disease with heart failure: Secondary | ICD-10-CM | POA: Diagnosis present

## 2021-09-08 DIAGNOSIS — I447 Left bundle-branch block, unspecified: Secondary | ICD-10-CM | POA: Diagnosis present

## 2021-09-08 DIAGNOSIS — I25119 Atherosclerotic heart disease of native coronary artery with unspecified angina pectoris: Secondary | ICD-10-CM | POA: Diagnosis not present

## 2021-09-08 DIAGNOSIS — N281 Cyst of kidney, acquired: Secondary | ICD-10-CM | POA: Diagnosis not present

## 2021-09-08 DIAGNOSIS — Z95810 Presence of automatic (implantable) cardiac defibrillator: Secondary | ICD-10-CM | POA: Diagnosis not present

## 2021-09-08 DIAGNOSIS — I428 Other cardiomyopathies: Secondary | ICD-10-CM | POA: Diagnosis present

## 2021-09-08 DIAGNOSIS — R710 Precipitous drop in hematocrit: Secondary | ICD-10-CM | POA: Diagnosis not present

## 2021-09-08 DIAGNOSIS — I959 Hypotension, unspecified: Secondary | ICD-10-CM

## 2021-09-08 DIAGNOSIS — I255 Ischemic cardiomyopathy: Secondary | ICD-10-CM | POA: Diagnosis present

## 2021-09-08 DIAGNOSIS — Z7901 Long term (current) use of anticoagulants: Secondary | ICD-10-CM | POA: Diagnosis not present

## 2021-09-08 DIAGNOSIS — I5042 Chronic combined systolic (congestive) and diastolic (congestive) heart failure: Secondary | ICD-10-CM | POA: Diagnosis present

## 2021-09-08 DIAGNOSIS — Z888 Allergy status to other drugs, medicaments and biological substances status: Secondary | ICD-10-CM | POA: Diagnosis not present

## 2021-09-08 DIAGNOSIS — I4892 Unspecified atrial flutter: Secondary | ICD-10-CM | POA: Diagnosis present

## 2021-09-08 DIAGNOSIS — R58 Hemorrhage, not elsewhere classified: Secondary | ICD-10-CM

## 2021-09-08 DIAGNOSIS — E119 Type 2 diabetes mellitus without complications: Secondary | ICD-10-CM | POA: Diagnosis present

## 2021-09-08 LAB — ECHOCARDIOGRAM LIMITED
AR max vel: 2.26 cm2
AV Peak grad: 10.4 mmHg
Ao pk vel: 1.61 m/s
Area-P 1/2: 2.74 cm2
Calc EF: 44.9 %
Height: 75 in
P 1/2 time: 567 msec
S' Lateral: 3.2 cm
Single Plane A2C EF: 45.8 %
Single Plane A4C EF: 43.8 %
Weight: 2818.36 oz

## 2021-09-08 LAB — BASIC METABOLIC PANEL
Anion gap: 2 — ABNORMAL LOW (ref 5–15)
Anion gap: 4 — ABNORMAL LOW (ref 5–15)
BUN: 19 mg/dL (ref 8–23)
BUN: 16 mg/dL (ref 8–23)
CO2: 26 mmol/L (ref 22–32)
CO2: 25 mmol/L (ref 22–32)
Calcium: 8.5 mg/dL — ABNORMAL LOW (ref 8.9–10.3)
Calcium: 8.5 mg/dL — ABNORMAL LOW (ref 8.9–10.3)
Chloride: 113 mmol/L — ABNORMAL HIGH (ref 98–111)
Chloride: 108 mmol/L (ref 98–111)
Creatinine, Ser: 1.25 mg/dL — ABNORMAL HIGH (ref 0.61–1.24)
Creatinine, Ser: 1.16 mg/dL (ref 0.61–1.24)
GFR, Estimated: 57 mL/min — ABNORMAL LOW (ref >60)
GFR, Estimated: >60 mL/min (ref >60)
Glucose, Bld: 111 mg/dL — ABNORMAL HIGH (ref 70–99)
Glucose, Bld: 117 mg/dL — ABNORMAL HIGH (ref 70–99)
Potassium: 3.9 mmol/L (ref 3.5–5.1)
Potassium: 4 mmol/L (ref 3.5–5.1)
Sodium: 141 mmol/L (ref 135–145)
Sodium: 137 mmol/L (ref 135–145)

## 2021-09-08 LAB — CBC
HCT: 32.4 % — ABNORMAL LOW (ref 39.0–52.0)
HCT: 32.7 % — ABNORMAL LOW (ref 39.0–52.0)
Hemoglobin: 10.5 g/dL — ABNORMAL LOW (ref 13.0–17.0)
Hemoglobin: 10.9 g/dL — ABNORMAL LOW (ref 13.0–17.0)
MCH: 28.8 pg (ref 26.0–34.0)
MCH: 29.5 pg (ref 26.0–34.0)
MCHC: 32.4 g/dL (ref 30.0–36.0)
MCHC: 33.3 g/dL (ref 30.0–36.0)
MCV: 89 fL (ref 80.0–100.0)
MCV: 88.6 fL (ref 80.0–100.0)
Platelets: 97 10*3/uL — ABNORMAL LOW (ref 150–400)
Platelets: 97 10*3/uL — ABNORMAL LOW (ref 150–400)
RBC: 3.64 MIL/uL — ABNORMAL LOW (ref 4.22–5.81)
RBC: 3.69 MIL/uL — ABNORMAL LOW (ref 4.22–5.81)
RDW: 15 % (ref 11.5–15.5)
RDW: 15.1 % (ref 11.5–15.5)
WBC: 6.7 10*3/uL (ref 4.0–10.5)
WBC: 6.4 10*3/uL (ref 4.0–10.5)
nRBC: 0 % (ref 0.0–0.2)
nRBC: 0 % (ref 0.0–0.2)

## 2021-09-08 LAB — PROTIME-INR
INR: 1.2 (ref 0.8–1.2)
Prothrombin Time: 15.2 seconds (ref 11.4–15.2)

## 2021-09-08 MED ORDER — SODIUM CHLORIDE 0.9 % IV SOLN: INTRAVENOUS | Status: AC

## 2021-09-08 MED ORDER — LABETALOL HCL 5 MG/ML IV SOLN: 10.0000 mg | INTRAVENOUS | Status: AC | PRN

## 2021-09-08 MED ORDER — SODIUM CHLORIDE 0.9 % IV SOLN: 250.0000 mL | INTRAVENOUS | Status: DC | PRN

## 2021-09-08 MED ORDER — SODIUM CHLORIDE 0.9% FLUSH: 3.0000 mL | INTRAVENOUS | Status: DC | PRN

## 2021-09-08 MED ORDER — ONDANSETRON HCL 4 MG/2ML IJ SOLN: 4.0000 mg | Freq: Four times a day (QID) | INTRAMUSCULAR | Status: DC | PRN

## 2021-09-08 MED ORDER — DOCUSATE SODIUM 100 MG PO CAPS: 100.0000 mg | ORAL_CAPSULE | Freq: Two times a day (BID) | ORAL | Status: DC

## 2021-09-08 MED ORDER — ASPIRIN 81 MG PO CHEW
81.0000 mg | CHEWABLE_TABLET | Freq: Every day | ORAL | Status: DC
  Administered 2021-09-09: 81 mg via ORAL
  Filled 2021-09-08: qty 1

## 2021-09-08 MED ORDER — SODIUM CHLORIDE 0.9% FLUSH
3.0000 mL | Freq: Two times a day (BID) | INTRAVENOUS | Status: DC
  Administered 2021-09-08 – 2021-09-09 (×3): 3 mL via INTRAVENOUS

## 2021-09-08 MED ORDER — DOCUSATE SODIUM 100 MG PO CAPS
100.0000 mg | ORAL_CAPSULE | Freq: Two times a day (BID) | ORAL | Status: DC
Start: 2021-09-08 — End: 2021-09-09
  Administered 2021-09-08 – 2021-09-09 (×2): 100 mg via ORAL
  Filled 2021-09-08: qty 1

## 2021-09-08 MED ORDER — HYDRALAZINE HCL 20 MG/ML IJ SOLN: 10.0000 mg | INTRAMUSCULAR | Status: AC | PRN

## 2021-09-08 MED ORDER — SODIUM CHLORIDE 0.9 % IV BOLUS
500.0000 mL | Freq: Once | INTRAVENOUS | Status: AC
  Administered 2021-09-08: 500 mL via INTRAVENOUS

## 2021-09-08 MED ORDER — ACETAMINOPHEN 325 MG PO TABS: 650.0000 mg | ORAL_TABLET | ORAL | Status: DC | PRN

## 2021-09-08 MED ORDER — CLOPIDOGREL BISULFATE 75 MG PO TABS
75.0000 mg | ORAL_TABLET | Freq: Every day | ORAL | Status: DC
  Administered 2021-09-09: 75 mg via ORAL
  Filled 2021-09-08: qty 1

## 2021-09-08 MED ORDER — WARFARIN SODIUM 7.5 MG PO TABS: 7.5000 mg | ORAL_TABLET | Freq: Once | ORAL | Status: DC

## 2021-09-08 NOTE — Progress Notes (Signed)
ANTICOAGULATION CONSULT NOTE  ? ?Pharmacy Consult for warfarin ?Indication:  history of PE on lifelong anticoagulation ? ?Allergies  ?Allergen Reactions  ? Ezetimibe Other (See Comments)  ?  Unknown  ? ? ?Patient Measurements: ?Height: '6\' 3"'$  (190.5 cm) ?Weight: 79.9 kg (176 lb 2.4 oz) ?IBW/kg (Calculated) : 84.5 ? ? ?Vital Signs: ?Temp: 98.1 ?F (36.7 ?C) (03/23 0503) ?Temp Source: Oral (03/23 0400) ?BP: 108/64 (03/23 0810) ?Pulse Rate: 60 (03/23 0503) ? ?Labs: ?Recent Labs  ?  09/07/21 ?0737 09/07/21 ?1506 09/07/21 ?1506 09/08/21 ?0322 09/08/21 ?0908  ?HGB  --  12.4*   < > 10.5* 10.9*  ?HCT  --  37.2*  --  32.4* 32.7*  ?PLT  --  107*  --  97* 97*  ?LABPROT 14.6  --   --   --  15.2  ?INR 1.1  --   --   --  1.2  ?CREATININE  --   --   --  1.25*  --   ? < > = values in this interval not displayed.  ? ? ? ?Estimated Creatinine Clearance: 49.7 mL/min (A) (by C-G formula based on SCr of 1.25 mg/dL (H)). ? ? ?Medical History: ?Past Medical History:  ?Diagnosis Date  ? Arthritis   ? Atrial flutter ([place])   ? RFCA   ? BPH (benign prostatic hypertrophy)   ? Cardiomyopathy, nonischemic -resolved   ? Echo 2008: EF 25% Cardiac CT 2009: Normal cors EF 31% Echo 3/10 EF 45% Echo 6/13: EF 50-55%    ? CHF (congestive heart failure) ([place])   ? Chicken pox   ? Colon polyps   ? hyperplastic  ? Diabetes mellitus ([place])   ? ED (erectile dysfunction)   ? Gilbert's syndrome   ? possible  ? Hemorrhoids   ? Herniated cervical disc   ? HLD (hyperlipidemia)   ? HTN (hypertension)   ? LBBB (left bundle branch block)   ? Orthostatic hypotension   ? Pacemaker  Medtronic   ? DOI 2/06  ? 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 Pulmonary embolism ([place])   ? Sinus node dysfunction (HCC)   ?    ? ?Assessment: ?87 year old male on lifelong anticoagulation with coumadin for history of pulmonary embolus. INR normal as he has been holding his warfarin for catheter procedure today being bridge with lovenox precath.  ? ?INR 1.2 this am, just slightly up from yesterday.  S/p CTO PCI 3/22. Warfarin resume last night.  ? ?Plan for triple therapy for 1 month then stop aspirin. Patient has INR follow up with PCP on 3/27. Recommended to cardiology to resume home dose at discharge.  ? ?No bleeding or hematoma noted although did see drop in hgb from 14 as outpatient to 12 on admit to now 10.9. Platelet count around 100 and stable at baseline. I discussed apixaban with patient, he is very comfortable with warfarin prefers not to change at this time due the ability to monitor/check INR with warfarin.  ? ?Prior to admission warfarin regimen was 7.'5mg'$  daily except '5mg'$  on MWF.  ? ?Goal of Therapy:  ?INR 2-3 ?Monitor platelets by anticoagulation protocol: Yes ?  ?Plan:  ?Warfarin 7.'5mg'$  x1 tonight if he stays ?Daily INR for now ? ?Erin Hearing PharmD., BCPS ?Clinical Pharmacist ?09/08/2021 9:41 AM ? ?

## 2021-09-08 NOTE — Care Management (Signed)
?  Transition of Care (TOC) Screening Note ? ? ?Patient Details  ?Name: Steven Macdonald ?Date of Birth: 07-09-1934 ? ? ?Transition of Care (TOC) CM/SW Contact:    ?Graves-Bigelow, Ocie Cornfield, RN ?Phone Number: ?09/08/2021, 11:50 AM ? ? ? ?Transition of Care Department [facility]) has reviewed patient and no TOC needs have been identified at this time. We will continue to monitor patient advancement through interdisciplinary progression rounds. If new patient transition needs arise, please place a TOC consult. ?  ?

## 2021-09-08 NOTE — Discharge Summary (Addendum)
?Discharge Summary  ?  ?Patient ID: Steven Macdonald ?MRN: [id number]; DOB: July 01, 1934 ? ?Admit date: 09/07/2021 ?Discharge date: 09/09/2021 ? ?PCP:  Crist Infante, MD ?  ?[facility] Providers ?Cardiologist:  Peter Martinique, MD  ?Electrophysiologist:  Virl Axe, MD  { ? ?Discharge Diagnoses  ?  ?Principal Problem: ?  CAD ?Active Problems: ?  Hypotension ?  Retroperitoneal bleed ?  Atrial flutter ([place]) ?  S/p Medtronic CRT-P ?  Chronic systolic CHF (congestive heart failure) ([place]) ?  Hyperlipidemia ?  LBBB (left bundle branch block) ?  Sinoatrial node dysfunction (HCC) ?  Pulmonary embolism ([place]) ? ? ? ?Diagnostic Studies/Procedures  ?  ?Coronary CTO Intervention 09/07/2021: ?  Mid LAD lesion is 100% stenosed. ?  A drug-eluting stent was successfully placed using a SYNERGY XD [id number]. ?  Post intervention, there is a 0% residual stenosis. ?  ?Successful CTO PCI of the LAD with IVUS guidance and DES x 1 ?  ?  ?Plan: observe overnight. Plan to resume coumadin tonight without bridging. ASA 81 mg x 1 month. Plavix 75 mg daily for at least 6 months. ? ?Diagnostic ?Dominance: Co-dominant ?Intervention ? ?  ?_____________ ? ?Abdominal/Pelvic CT 09/08/2021: ?Impressions: ?Mild left-sided retroperitoneal hemorrhage, and mild hemorrhage ?within the left lower quadrant abdominal wall soft tissues. ?  ?Distended urinary bladder with small right posterior bladder ?diverticulum, likely due to chronic bladder outlet obstruction given ?enlarged prostate. ?_______________ ? ?Limited Echocardiogram 09/08/2021: ?Impressions: ?1. Moderate hypertrophy of the basal septum with otherwise mild LVH.  ?Compared with the echo 40/9811, systolic function has improved. Left  ?ventricular ejection fraction, by estimation, is 55 to 60%. The left  ?ventricle has normal function. There is  ?moderate left ventricular hypertrophy of the basal-septal segment. Left  ?ventricular diastolic parameters are consistent with Grade I diastolic  ?dysfunction  (impaired relaxation).  ? 2. Right ventricular systolic function is normal. The right ventricular  ?size is normal.  ? 3. The mitral valve is normal in structure. Trivial mitral valve  ?regurgitation. No evidence of mitral stenosis.  ? 4. The aortic valve is tricuspid. There is mild calcification of the  ?aortic valve. There is mild thickening of the aortic valve. Aortic valve  ?regurgitation is trivial.  ? 5. Aortic dilatation noted. There is mild dilatation of the aortic root,  ?measuring 38 mm. There is mild dilatation of the ascending aorta,  ?measuring 38 mm.  ?  ?History of Present Illness   ?  ?Steven Macdonald is a 87 y.o. male with a history of CAD with 100% stenosis of mid LAD with collaterals noted on recent cath in 07/2021, chronic combined CHF, paroxysmal atrial flutter on PE, sinus node dysfunction s/p CRT-P, LBBB, prior PE, hypertension with prior orthostatic hypotension, hyperlipidemia, and type 2 diabetes mellitus who presented on 09/07/2021 for planned CTO PCI. ? ?Patient has known CAD with occlusion of proximal LAD noted on coronary CTA in 11/2017. Echo in 01/2021 showed LVEF of 30-35% with global hypokinesis and mild AI. He underwent LHC on 08/02/2021 for further evaluation of chest pain which showed CTO of the proximal to mid LAD with collaterals. Ranexa and Plavix were added to his medical therapy and he was referred to Dr. Martinique for consideration of CTO PCI. He saw Dr. Martinique on 08/31/2021 at which time he reported the medication had helped but he was still having significant chest pain with exertion. He reported only being able to walk 1 mile whereas before he was able to walk 3 miles.  CTO PCI of LAD was arranged.  ? ?Hospital Course  ?   ?Consultants: None  ? ?CAD ?Hypotension ?Retroperitoneal Hemorrhage ?Patient presented on 09/07/2021 for planned CTO PCI of LAD as stated above. PCI of the LAD was successful and DES was placed using IVUS guidance. He tolerated the procedure well but was noted to be  hypotensive following procedure requiring fluids. Hypotension persisted throughout the night. Bedside Echo showed no evidence of pericardial effusion. He was noted to have almost a 4 g/dL drop in her hemoglobin from 14.4 to 10.5. He was asymptomatic with this though. Abdominal/pelvic CT was ordered and revealed a mild left-sided retroperitoneal hemorrhage, and mild hemorrhage within the left lower quadrant abdominal wall soft tissue. Formal Echo on 3/23 showed normal LV function with no mention of pericardial effusion. Home Coumadin was held. Hemoglobin stable at 10.3 on day of discharge. Initial plan was for triple therapy with Aspirin, Plavix, and Coumadin for 1 month at which time Aspirin can be stopped. However, given retroperitoneal bleed, will hold Coumadin for 1 week. Given PCI to LAD, will have to continue Aspirin and Plavix. Will repeat CBC in 1 week and if hemoglobin stable can restart Coumadin. Will stop Imdur and Ranexa now that he has been revascularized. Continue statin. Discussed restrictions and signs to watch out. ? ?Chronic Combined CHF ?Echo in 01/2021 showed LVEF of 30-35%. However, repeat limited Echo this admission showed LVEF of 55-60% with moderate hypertrophy of the basal septum with otherwise mild LVH and grade 1 diastolic dysfunction. He remained euvolemic throughout admission. Continue home Toprol-XL 12.'5mg'$  daily. Entresto was stopped due to soft BP. Will continue to hold this at discharge. May be able to resume at follow-up visit if BP will allow. ? ?Paroxysmal Atrial Flutter ?Sinus Node Dysfunction s/p CRT-P ?Patient denies any history of atrial flutter but per chart review he is s/p RFCA for atrial flutter. Remained in AV paced rhythm this admission. Continue home Toprol-XL 12.'5mg'$  daily. On chronic anticoagulation with Coumadin. This will be held for 1 week at discharge given retroperitoneal bleed. Will repeat CBC in 1 week and if stable can resume. Of note, INR is followed at his PCP's  office and he is scheduled for a INR check on 09/12/2021. Advised patient to update this on current plan. Discharge summary will also be faxed to PCP's office.  ? ?History or PE ?Patient has a history of large PE in 2006 and has been on chronic Coumadin since then. No recurrent PT since then. See recommendations for Coumadin above. ? ?Hyperlipidemia ?Recent lipid panel on 08/31/2021: Total Cholesterol 130, Triglycerides 81, HDL 54, LDL 60. Continue Crestor '20mg'$  daily. ? ?Patient seen and examined by Dr. Marlou Porch today and determined to be stable for discharge. Outpatient follow-up arranged. Medications as below.  ? ?Did the patient have an acute coronary syndrome (MI, NSTEMI, STEMI, etc) this admission?:  No                               ?Did the patient have a percutaneous coronary intervention (stent / angioplasty)?:  Yes.   ? ? ?Cath/PCI Registry Performance & Quality Measures: ?Aspirin prescribed? - Yes ?ADP Receptor Inhibitor (Plavix/Clopidogrel, Brilinta/Ticagrelor or Effient/Prasugrel) prescribed (includes medically managed patients)? - Yes ?High Intensity Statin (Lipitor 40-'80mg'$  or Crestor 20-'40mg'$ ) prescribed? - Yes ?For EF <40%, was ACEI/ARB prescribed? - Not Applicable (EF >/= 38%) ?For EF <40%, Aldosterone Antagonist (Spironolactone or Eplerenone) prescribed? - Not Applicable (EF >/=  40%) ?Cardiac Rehab Phase II ordered? - Yes  ? ?_____________ ? ?Discharge Vitals ?Blood pressure (!) 101/58, pulse 61, temperature 97.6 ?F (36.4 ?C), temperature source Oral, resp. rate 18, height '6\' 3"'$  (1.905 m), weight 79.9 kg, SpO2 96 %.  ?Filed Weights  ? 09/07/21 0530 09/07/21 1359  ?Weight: 80.7 kg 79.9 kg  ? ?General: 87 y.o. thin Caucasian male resting comfortably in no acute distress. ?HEENT: Normocephalic and atraumatic. Sclera clear.  ?Neck: Supple. No JVD. ?Heart: RRR. Distinct S1 and S2. Soft II/VI systolic murmur noted. Left femoral cath site with ecchymosis but soft with no hematoma. Right femoral cath site soft  with no signs of hematoma. ?Lungs: No increased work of breathing. Clear to ausculation bilaterally. No wheezes, rhonchi, or rales.  ?Abdomen: Soft, non-distended, and non-tender to palpation.  ?Extremitie

## 2021-09-08 NOTE — Progress Notes (Signed)
RN called for Hypotension- several episodes were MAP in 62s ? ?IVF NS 500cc given with mild improvement In BP but again was 88/56. ?Patient had sucessful CTO LAD PCI today ?-bedside ECHO: no pericardial effusion, LVEF is reasonable, RV looks good, IVC is small and collapsable ? ? - his BP improved to 100/56 after IVF 500cc. ?- will get an official ECHO in am. ?

## 2021-09-08 NOTE — Progress Notes (Signed)
CARDIAC REHAB PHASE I  ? ?Stent education completed with pt and spouse. Pt educated on ASA, Plavix, and Coumadin. Pt given stent card and heart healthy diet. Reviewed site care, restrictions, and exercise guidelines. Will refer to CRP II [place] with knowledge pt does not want to participate. Pt hopeful for d/c later today. ? ?[id number] ?Rufina Falco, RN BSN ?09/08/2021 ?11:18 AM ? ?

## 2021-09-08 NOTE — Progress Notes (Addendum)
? ?Progress Note ? ?Patient Name: Steven Macdonald ?Date of Encounter: 09/08/2021 ? ?[facility] Cardiologist: Steven Martinique, MD  ? ?Subjective  ? ?Patient underwent CTO PCI of mid LAD. Patient developed hypotension after procedure and was given fluids. He vagaled down again when sheath was pulled with systolic BP in the 75O. However, he was basically asymptomatic with this. He required additional fluids overnight due to persistent hypotension with MAPs in the low 60s. Bedside Echo no pericardial effusion.  ? ?Patient feeling well this morning. He denies any chest pain, shortness of breath, lightheadedness, dizziness, near syncope. He is eager to go home today. ? ?Inpatient Medications  ?  ?Scheduled Meds: ? aspirin EC  81 mg Oral Daily  ? clopidogrel  75 mg Oral Daily  ? docusate sodium  200 mg Oral Daily  ? doxycycline  100 mg Oral Q supper  ? finasteride  5 mg Oral QPM  ? metoprolol succinate  12.5 mg Oral QHS  ? mirtazapine  15 mg Oral QHS  ? polyethylene glycol  17 g Oral Daily  ? ranolazine  1,000 mg Oral BID  ? rosuvastatin  20 mg Oral Daily  ? sacubitril-valsartan  1 tablet Oral BID  ? sodium chloride flush  3 mL Intravenous Q12H  ? tamsulosin  0.4 mg Oral q morning  ? Warfarin - Pharmacist Dosing Inpatient   Does not apply [id number]  ? ?Continuous Infusions: ? sodium chloride 75 mL/hr at 09/08/21 0315  ? ?PRN Meds: ?nitroGLYCERIN  ? ?Vital Signs  ?  ?Vitals:  ? 09/08/21 0423 09/08/21 9826 09/08/21 0503 09/08/21 0535  ?BP: (!) 88/56  (!) 93/58 90/61  ?Pulse:  60 60   ?Resp:  16 16   ?Temp:  98.2 ?F (36.8 ?C) 98.1 ?F (36.7 ?C)   ?TempSrc:      ?SpO2:  96% 96%   ?Weight:      ?Height:      ? ? ?Intake/Output Summary (Last 24 hours) at 09/08/2021 0756 ?Last data filed at 09/07/2021 1700 ?Gross per 24 hour  ?Intake 688.75 ml  ?Output --  ?Net 688.75 ml  ? ? ?  09/07/2021  ?  1:59 PM 09/07/2021  ?  5:30 AM 08/31/2021  ? 10:10 AM  ?Last 3 Weights  ?Weight (lbs) 176 lb 2.4 oz 178 lb 176 lb 3.2 oz  ?Weight (kg) 79.9 kg 80.74  kg 79.924 kg  ?   ? ?Telemetry  ?  ?AV paced rhythm with rates in the 60s. - Personally Reviewed ? ?ECG  ?  ?No new ECG tracing today. - Personally Reviewed ? ?Physical Exam  ? ?GEN: No acute distress.   ?Neck: No JVD. ?Cardiac: RRR. II/VI murmur noted at apex. No rubs or gallops. Bilateral femoral cath sites soft with mild ecchymosis but no signs of hematoma. No bruits. Good femoral pulses.  ?Respiratory: No increased work of breathing. Possible mild crackles in left base but otherwise lung clear to ausculation. ?GI: Soft, non-distended, and non-tender. ?Skin: Warm and dry. Mild ecchymosis at femoral cath sites as well as on lower abdomen (from Lovenox shots). No flank/bank ecchymosis to suggest retroperitoneal bleed. ?MS: No lower extremity edema. No deformity. ?Neuro:  No focal deficits. ?Psych: Normal affect. Responds appropriately. ? ?Labs  ?  ?High Sensitivity Troponin:  No results for input(s): TROPONINIHS in the last 720 hours.   ?Chemistry ?Recent Labs  ?Lab 09/08/21 ?0322  ?NA 141  ?K 3.9  ?CL 113*  ?CO2 26  ?GLUCOSE 111*  ?BUN  19  ?CREATININE 1.25*  ?CALCIUM 8.5*  ?GFRNONAA 57*  ?ANIONGAP 2*  ?  ?Lipids No results for input(s): CHOL, TRIG, HDL, LABVLDL, LDLCALC, CHOLHDL in the last 168 hours.  ?Hematology ?Recent Labs  ?Lab 09/07/21 ?1506 09/08/21 ?0322  ?WBC 8.1 6.7  ?RBC 4.22 3.64*  ?HGB 12.4* 10.5*  ?HCT 37.2* 32.4*  ?MCV 88.2 89.0  ?MCH 29.4 28.8  ?MCHC 33.3 32.4  ?RDW 14.9 15.0  ?PLT 107* 97*  ? ?Thyroid No results for input(s): TSH, FREET4 in the last 168 hours.  ?BNPNo results for input(s): BNP, PROBNP in the last 168 hours.  ?DDimer No results for input(s): DDIMER in the last 168 hours.  ? ?Radiology  ?  ?CARDIAC CATHETERIZATION ? ?Result Date: 09/07/2021 ?  Mid LAD lesion is 100% stenosed.   A drug-eluting stent was successfully placed using a SYNERGY XD 3.0X48.   Post intervention, there is a 0% residual stenosis. Successful CTO PCI of the LAD with IVUS guidance and DES x 1 Plan: observe  overnight. Plan to resume coumadin tonight without bridging. ASA 81 mg x 1 month. Plavix 75 mg daily for at least 6 months.   ? ?Cardiac Studies  ? ?Coronary CTO Intervention 09/07/2021: ?  Mid LAD lesion is 100% stenosed. ?  A drug-eluting stent was successfully placed using a SYNERGY XD 3.0X48. ?  Post intervention, there is a 0% residual stenosis. ?  ?Successful CTO PCI of the LAD with IVUS guidance and DES x 1 ?  ?  ?Plan: observe overnight. Plan to resume coumadin tonight without bridging. ASA 81 mg x 1 month. Plavix 75 mg daily for at least 6 months.  ? ?Diagnostic ?Dominance: Co-dominant ?Intervention ? ? ? ?Patient Profile  ?   ?87 y.o. male with a history of CAD with 100% stenosis of mid LAD with collaterals noted on recent cath in 07/2021, chronic combined CHF, paroxysmal atrial flutter on PE, sinus node dysfunction s/p CRT-P, LBBB, prior PE, hypertension with prior orthostatic hypotension, hyperlipidemia, and type 2 diabetes mellitus who presented on 09/08/2021 for planned CTO PCI of LAD. PCI was successful but he has had issues with hypotension following cath requiring IV fluids.  ? ?Assessment & Plan  ?  ?CAD ?S/p successful CTO PCI of the LAD with IVUS guidance and DES yesterday. ?- No recurrent chest pain.  ?- Plan is for triple therapy with Aspirin '81mg'$  daily, Plavix '75mg'$  daily, and Coumadin for 1 month at which time Aspirin can be stopped. Plavix should be continued for at least 6 months. ?- Patient would prefer to stop Ranexa. Given he has been revascularized, I think this is reasonable. Will discuss with MD. ? ?Hypotension ?Patient has had hypotension following CTO PCI requiring fluids. Bedside Echo last night showed no pericardial effusion. ?- BP better this morning. Stable at 108/64 on last check. ?- Hemoglobin downtrending from 14.4 on 3/15 to 12.4 yesterday and now 10.5 today. Femoral cath sites are soft with mild ecchymosis but no signs of hematoma. No flank/bank ecchymosis. ?- Formal Echo  pending. ?- Given hypotension and drop in hemoglobin, will discuss with MD about whether CT is needed to rule out retroperitoneal bleed. ? ?Chronic Combined CHF ?Echo in 01/2021 showed LVEF of 30-35% with global hypokinesis (worse in the septum) and moderate LVH of the basal septum with otherwise mild LVH. ?- Euvolemic on exam. ?- Currently on home Entresto 24-'26mg'$  twice daily and Toprol-XL 12.'5mg'$  daily. He has already received morning dose of both of these and so far  is tolerating it well. If BP remains stable can continue these. ? ?Paroxysmal Atrial Flutter ?Sinus Node Dysfunction s/p CRT-P ?- Telemetry shows AV paced rhythm. ?- Continue low Toprol-XL as above. ?- Continue home Coumadin. ? ?Hyperlipidemia ?Lipid panel on 08/31/2021: Total Cholesterol 130, Triglycerides 81, HDL 54, LDL 60. - Continue Crestor '20mg'$  daily. ? ?Diabetes Mellitus ?Patient has diabetes mellitus listed in his chart but is not on any medications at home. When asked about this, patient denies every being diagnosed with diabetes. No hemoglobin A1c in our system. ?Not on any medications at home.  ?- Glucose on BMET okday. ? ?For questions or updates, please contact Steven Macdonald ?Please consult [email or web address] for contact info under  ? ?  ?   ?Signed, ?Steven Mclean, PA-C  ?09/08/2021, 7:56 AM   ? ?Personally seen and examined. Agree with above. ? ?Small left-sided retroperitoneal hematoma noted on CT scan.  We will keep him here tonight and recheck CBC in the morning.  Want to make sure that there are no significant changes. ? ?Appeared comfortable, normal respiratory effort.  No significant back pain.  There is no appreciable hematoma peripherally. ? ?We will hold warfarin over the next week.  This is for paroxysmal atrial fibrillation.  He is currently AV paced.  Must continue the aspirin and clopidogrel given his recent PCI. ? ?Steven Furbish, MD ? ?

## 2021-09-08 NOTE — Progress Notes (Signed)
Mobility Specialist Progress Note ? ? 09/08/21 1716  ?Mobility  ?Activity Ambulated independently in hallway  ?Level of Assistance Independent  ?Assistive Device None  ?Distance Ambulated (ft) 940 ft  ?Activity Response Tolerated well  ?$Mobility charge 1 Mobility  ? ?Received pt in hallway having no complaints and agreeable to mobility. Asymptomatic throughout ambulation, returned back to EOB w/ call bell in reach and all needs met. ? ?Holland Falling ?Mobility Specialist ?Phone Number [phone] ? ?

## 2021-09-08 NOTE — Discharge Instructions (Addendum)
Medication Changes: ?- START Aspirin '81mg'$  daily in addition to Plavix which you are already taking. You will likely be able to stop this after 1 month but we will review this more at follow-up visit. ?- HOLD Coumadin for 1 week given small retroperitoneal bleed. Will recheck a CBC in 1 week and if hemoglobin stable should be able to restart this. ?- STOP Entresto for now given soft BP. ?- STOP Imdur and Renexa now that the blockage in your heart has been opened up. ? ?Post Cardiac Catheterization: ?NO HEAVY LIFTING OR SEXUAL ACTIVITY X 7 DAYS. ?NO DRIVING X 7 DAYS. ?NO SOAKING BATHS, HOT TUBS, POOLS, ETC., X 7 DAYS. ? ?Groin Site Care: ?Refer to this sheet in the next few weeks. These instructions provide you with information on caring for yourself after your procedure. Your caregiver may also give you more specific instructions. Your treatment has been planned according to current medical practices, but problems sometimes occur. Call your caregiver if you have any problems or questions after your procedure. ?HOME CARE INSTRUCTIONS ?You may shower 24 hours after the procedure. Remove the bandage (dressing) and gently wash the site with plain soap and water. Gently pat the site dry.  ?Do not apply powder or lotion to the site.  ?Do not sit in a bathtub, swimming pool, or whirlpool for 5 to 7 days.  ?No bending, squatting, or lifting anything over 10 pounds (4.5 kg) as directed by your caregiver.  ?Inspect the site at least twice daily.  ?Do not drive home if you are discharged the same day of the procedure. Have someone else drive you.  ?What to expect: ?Any bruising will usually fade within 1 to 2 weeks.  ?Blood that collects in the tissue (hematoma) may be painful to the touch. It should usually decrease in size and tenderness within 1 to 2 weeks.  ?SEEK IMMEDIATE MEDICAL CARE IF: ?You have unusual pain at the groin site or down the affected leg.  ?You have redness, warmth, swelling, or pain at the groin site.   ?You have drainage (other than a small amount of blood on the dressing).  ?You have chills.  ?You have a fever or persistent symptoms for more than 72 hours.  ?You have a fever and your symptoms suddenly get worse.  ?Your leg becomes pale, cool, tingly, or numb. You have heavy bleeding from the site. Hold pressure on the site.  ? ? ?

## 2021-09-09 ENCOUNTER — Other Ambulatory Visit: Payer: Self-pay | Admitting: Student

## 2021-09-09 DIAGNOSIS — R58 Hemorrhage, not elsewhere classified: Secondary | ICD-10-CM

## 2021-09-09 LAB — CBC
HCT: 31.1 % — ABNORMAL LOW (ref 39.0–52.0)
Hemoglobin: 10.3 g/dL — ABNORMAL LOW (ref 13.0–17.0)
MCH: 29.4 pg (ref 26.0–34.0)
MCHC: 33.1 g/dL (ref 30.0–36.0)
MCV: 88.9 fL (ref 80.0–100.0)
Platelets: 96 10*3/uL — ABNORMAL LOW (ref 150–400)
RBC: 3.5 MIL/uL — ABNORMAL LOW (ref 4.22–5.81)
RDW: 15.3 % (ref 11.5–15.5)
WBC: 6.4 10*3/uL (ref 4.0–10.5)
nRBC: 0 % (ref 0.0–0.2)

## 2021-09-09 LAB — PROTIME-INR
INR: 1.1 (ref 0.8–1.2)
Prothrombin Time: 14.1 seconds (ref 11.4–15.2)

## 2021-09-09 NOTE — Progress Notes (Signed)
CARDIAC REHAB PHASE I  ? ?PRE:  Rate/Rhythm: 60 paced ? ?MODE:  Ambulation: 470 ft  ? ?POST:  Rate/Rhythm: 89 paced ? ? ?Pt up in recliner. Feels better today. Reinforced importance of ASA and Plavix. Reviewed site care, restrictions, and exercise guidelines. Pt walking independently without SOB, CP, or dizziness. Referred to [facility] [place], pt not interested in attending. ? ?[id number] ?Rufina Falco, RN BSN ?09/09/2021 ?8:56 AM ? ?

## 2021-09-12 ENCOUNTER — Other Ambulatory Visit: Payer: Self-pay

## 2021-09-12 DIAGNOSIS — R58 Hemorrhage, not elsewhere classified: Secondary | ICD-10-CM

## 2021-09-12 LAB — CBC
Hematocrit: 33.2 % — ABNORMAL LOW (ref 37.5–51.0)
Hemoglobin: 11 g/dL — ABNORMAL LOW (ref 13.0–17.7)
MCH: 29.2 pg (ref 26.6–33.0)
MCHC: 33.1 g/dL (ref 31.5–35.7)
MCV: 88 fL (ref 79–97)
Platelets: 143 10*3/uL — ABNORMAL LOW (ref 150–450)
RBC: 3.77 x10E6/uL — ABNORMAL LOW (ref 4.14–5.80)
RDW: 14.5 % (ref 11.6–15.4)
WBC: 6.9 10*3/uL (ref 3.4–10.8)

## 2021-09-19 DIAGNOSIS — I509 Heart failure, unspecified: Secondary | ICD-10-CM | POA: Diagnosis not present

## 2021-09-19 DIAGNOSIS — Z7901 Long term (current) use of anticoagulants: Secondary | ICD-10-CM | POA: Diagnosis not present

## 2021-09-20 NOTE — Progress Notes (Signed)
Remote pacemaker transmission.   

## 2021-09-22 DIAGNOSIS — Z7901 Long term (current) use of anticoagulants: Secondary | ICD-10-CM | POA: Diagnosis not present

## 2021-09-22 NOTE — Progress Notes (Signed)
?  ?Cardiology Office Note ? ? ?Date:  09/28/2021  ? ?ID:  ANDRIEL OMALLEY, DOB 11/17/1934, MRN [id number] ? ?PCP:  Crist Infante, MD  ?Cardiologist:   Kennieth Plotts Martinique, MD  ? ?Chief Complaint  ?Patient presents with  ? Coronary Artery Disease  ? ? ?  ?History of Present Illness: ?AIDRIC ENDICOTT is a 87 y.o. male who is seen at the request of Dr Gasper Sells for consideration of CTO PCI of the LAD. He is an 87 yo male with history of atrial flutter s/p ablation, cardiomyopathy. chronic diastolic CHF, DM, hyperlipidemia, HTN, LBBB, orthostatic hypotension, sinus node dysfunction s/p pacemaker placement, CAD and PE on lifelong coumadin therapy. He is followed in our office by Dr. Caryl Comes and has also been seen by Dr. Gasper Sells. He is known to have CAD with coronary CTA in June 2019 showing occlusion of the proximal LAD. Minimal disease in the RCA and Circumflex. Echo August 2022 with LVEF=30-35% with global hypokinesis, mild AI. He has been on a beta blocker and Entresto. He is on coumadin therapy given history of PE and atrial flutter. He was seen in Feb by Dr Angelena Form as a work in for evaluation of chest pain.  Bisoprolol was stopped and Imdur 15 mg daily was started.  He was scheduled for cardiac cath with bridging Lovenox. Cardiac cath was performed by Dr Ellyn Hack on 08/02/21. This showed a CTO of the proximal LAD with collaterals. Ranexa was added for therapy and Plavix.  ? ?He noted the medication has helped but he still has significant chest pain with exertion. Able to walk only a mile whereas before he was able to walk 3 miles. Some DOE. No palpitations. No bleeding.  ? ?He presented on 09/07/2021 for planned CTO PCI of LAD as stated above. PCI of the LAD was successful and DES was placed using IVUS guidance. He tolerated the procedure well but was noted to be hypotensive following procedure requiring fluids. Hypotension persisted throughout the night. Bedside Echo showed no evidence of pericardial effusion. He was  noted to have almost a 4 g/dL drop in her hemoglobin from 14.4 to 10.5. He was asymptomatic with this though. Abdominal/pelvic CT was ordered and revealed a mild left-sided retroperitoneal hemorrhage, and mild hemorrhage within the left lower quadrant abdominal wall soft tissue. Formal Echo on 3/23 showed normal LV function with no mention of pericardial effusion. Home Coumadin was held. Hemoglobin stable at 10.3 on day of discharge. Initial plan was for triple therapy with Aspirin, Plavix, and Coumadin for 1 month at which time Aspirin can be stopped. However, given retroperitoneal bleed, will hold Coumadin for 1 week. Given PCI to LAD, will have to continue Aspirin and Plavix.  Imdur and Ranexa were discontinued. Hgb improved to 11.0 on outpatient follow up.  ? ?On follow up today he is doing very well. Is walking up to 4 miles a day without angina or dyspnea. Groin area was significantly bruised but has recovered. Only on low dose Toprol now.  ? ? ?Past Medical History:  ?Diagnosis Date  ? Arthritis   ? Atrial flutter ([place])   ? RFCA   ? BPH (benign prostatic hypertrophy)   ? Cardiomyopathy, nonischemic -resolved   ? Echo 2008: EF 25% Cardiac CT 2009: Normal cors EF 31% Echo 3/10 EF 45% Echo 6/13: EF 50-55%    ? CHF (congestive heart failure) ([place])   ? Chicken pox   ? Colon polyps   ? hyperplastic  ? Diabetes mellitus ([place])   ?  ED (erectile dysfunction)   ? Gilbert's syndrome   ? possible  ? Hemorrhoids   ? Herniated cervical disc   ? HLD (hyperlipidemia)   ? HTN (hypertension)   ? LBBB (left bundle branch block)   ? Orthostatic hypotension   ? Pacemaker  [employer]tronic   ? DOI 2/06  ? 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[place])   ? Pulmonary embolism ([place])   ? Sinus node dysfunction ([facility])   ?    ? ? ?Past Surgical History:  ?Procedure Laterality Date  ? BIV UPGRADE N/A 08/25/2020  ? Procedure: BIV PPM UPGRADE;  Surgeon: Deboraha Sprang, MD;  Location: [facility];  Service: Cardiovascular;  Laterality: N/A;  ? CARDIOVERSION     ? COLONOSCOPY WITH PROPOFOL N/A 01/20/2021  ? Procedure: COLONOSCOPY WITH PROPOFOL;  Surgeon: Irene Shipper, MD;  Location: [facility];  Service: Endoscopy;  Laterality: N/A;  ? coronary ablation    ? CORONARY CTO INTERVENTION N/A 09/07/2021  ? Procedure: CORONARY CTO INTERVENTION;  Surgeon: Martinique, Luigi Stuckey M, MD;  Location: [facility];  Service: Cardiovascular;  Laterality: N/A;  ? CYSTOSCOPY W/ TRANSURETHRAL RESECTION OF POSTERIOR URETHERAL VALVES    ? INTRAVASCULAR ULTRASOUND/IVUS N/A 09/07/2021  ? Procedure: Intravascular Ultrasound/IVUS;  Surgeon: Martinique, Xayla Puzio M, MD;  Location: [facility];  Service: Cardiovascular;  Laterality: N/A;  ? LEFT HEART CATH AND CORONARY ANGIOGRAPHY N/A 08/02/2021  ? Procedure: LEFT HEART CATH AND CORONARY ANGIOGRAPHY;  Surgeon: Leonie Man, MD;  Location: [facility];  Service: Cardiovascular;  Laterality: N/A;  ? PACEMAKER GENERATOR CHANGE N/A 05/13/2014  ? Procedure: PACEMAKER GENERATOR CHANGE;  Surgeon: Deboraha Sprang, MD;  Location: [facility];  Service: Cardiovascular;  Laterality: N/A;  ? PACEMAKER INSERTION    ? POLYPECTOMY  01/20/2021  ? Procedure: POLYPECTOMY;  Surgeon: Irene Shipper, MD;  Location: [facility];  Service: Endoscopy;;  ? ? ? ?Current Outpatient Medications  ?Medication Sig Dispense Refill  ? Calcium Carb-Cholecalciferol (CALCIUM 600+D3 PO) Take 5,000 mg by mouth daily.    ? cholecalciferol (VITAMIN D) 1000 UNITS tablet Take 1,000 Units by mouth daily.    ? clopidogrel (PLAVIX) 75 MG tablet Take 1 tablet (75 mg total) by mouth daily. 90 tablet 3  ? Cyanocobalamin (VITAMIN B-12) 1000 MCG SUBL Place 1,000 mcg under the tongue daily.    ? docusate sodium (COLACE) 100 MG capsule Take 200 mg by mouth daily.    ? doxycycline (VIBRAMYCIN) 100 MG capsule Take 100 mg by mouth daily with supper.    ? finasteride (PROSCAR) 5 MG tablet Take 5 mg by mouth every evening.    ? metoprolol succinate (TOPROL-XL) 25 MG 24 hr tablet Take 12.5 mg by mouth  at bedtime.    ? mirtazapine (REMERON) 15 MG tablet Take 15 mg by mouth at bedtime.    ? nitroGLYCERIN (NITROSTAT) 0.4 MG SL tablet Place 0.4 mg under the tongue every 5 (five) minutes as needed for chest pain.    ? Omega-3 Fatty Acids (FISH OIL) 1200 MG CAPS Take 2,400 mg by mouth daily.    ? polyethylene glycol (MIRALAX / GLYCOLAX) 17 g packet Take 17 g by mouth daily.    ? Probiotic Product (ALIGN PO) Take 4 mg by mouth daily.    ? PSYLLIUM PO Take 10 mLs by mouth daily. Citrucel powder    ? rosuvastatin (CRESTOR) 20 MG tablet Take 20 mg by mouth daily.    ? sildenafil (REVATIO) 20 MG  tablet Take 100 mg by mouth daily as needed (erectile dysfunction).    ? tamsulosin (FLOMAX) 0.4 MG CAPS capsule Take 0.4 mg by mouth every morning.     ? warfarin (COUMADIN) 5 MG tablet 7.5 mg every sunday, monday,  wed, and friday. and 5 mg every saturday, tues, and thursday    ? ?No current facility-administered medications for this visit.  ? ? ?Allergies:   Ezetimibe  ? ? ?Social History:  The patient  reports that he quit smoking about 50 years ago. His smoking use included cigarettes. He has never used smokeless tobacco. He reports current alcohol use. He reports that he does not use drugs.  ? ?Family History:  The patient's family history includes Bladder Cancer in his father; Breast cancer in his mother; Colon polyps in his father.  ? ? ?ROS:  Please see the history of present illness.   Otherwise, review of systems are positive for none.   All other systems are reviewed and negative.  ? ? ?PHYSICAL EXAM: ?VS:  BP 130/80 (BP Location: Left Arm)   Pulse 62   Ht '6\' 3"'$  (1.905 m)   Wt 175 lb 12.8 oz (79.7 kg)   SpO2 96%   BMI 21.97 kg/m?  , BMI Body mass index is 21.97 kg/m?. ?GEN: Well nourished, well developed, in no acute distress ?HEENT: normal ?Neck: no JVD, carotid bruits, or masses ?Cardiac: RRR; no murmurs, rubs, or gallops,no edema  ?Respiratory:  clear to auscultation bilaterally, normal work of breathing ?GI:  soft, nontender, nondistended, + BS ?MS: no deformity or atrophy ?Skin: warm and dry, no rash ?Neuro:  Strength and sensation are intact ?Psych: euthymic mood, full affect ? ? ?EKG:  EKG is not ordered today.

## 2021-09-27 DIAGNOSIS — Z7901 Long term (current) use of anticoagulants: Secondary | ICD-10-CM | POA: Diagnosis not present

## 2021-09-27 DIAGNOSIS — D6869 Other thrombophilia: Secondary | ICD-10-CM | POA: Diagnosis not present

## 2021-09-28 ENCOUNTER — Encounter: Payer: Self-pay | Admitting: Cardiology

## 2021-09-28 ENCOUNTER — Ambulatory Visit (INDEPENDENT_AMBULATORY_CARE_PROVIDER_SITE_OTHER): Payer: Medicare Other | Admitting: Cardiology

## 2021-09-28 VITALS — BP 130/80 | HR 62 | Ht 75.0 in | Wt 175.8 lb

## 2021-09-28 DIAGNOSIS — Z950 Presence of cardiac pacemaker: Secondary | ICD-10-CM | POA: Diagnosis not present

## 2021-09-28 DIAGNOSIS — I5022 Chronic systolic (congestive) heart failure: Secondary | ICD-10-CM | POA: Diagnosis not present

## 2021-09-28 DIAGNOSIS — I447 Left bundle-branch block, unspecified: Secondary | ICD-10-CM | POA: Diagnosis not present

## 2021-09-28 DIAGNOSIS — R58 Hemorrhage, not elsewhere classified: Secondary | ICD-10-CM

## 2021-09-28 DIAGNOSIS — I25110 Atherosclerotic heart disease of native coronary artery with unstable angina pectoris: Secondary | ICD-10-CM

## 2021-09-28 DIAGNOSIS — I209 Angina pectoris, unspecified: Secondary | ICD-10-CM

## 2021-10-03 DIAGNOSIS — Z7901 Long term (current) use of anticoagulants: Secondary | ICD-10-CM | POA: Diagnosis not present

## 2021-10-24 ENCOUNTER — Encounter (HOSPITAL_COMMUNITY): Payer: Self-pay | Admitting: Internal Medicine

## 2021-10-24 ENCOUNTER — Ambulatory Visit (HOSPITAL_COMMUNITY)
Admission: RE | Admit: 2021-10-24 | Discharge: 2021-10-24 | Disposition: A | Discharge status: Home / Self Care | Payer: Medicare Other | Source: Ambulatory Visit | Attending: Internal Medicine | Admitting: Internal Medicine

## 2021-10-24 VITALS — BP 118/70 | HR 60 | Wt 176.0 lb

## 2021-10-24 DIAGNOSIS — E119 Type 2 diabetes mellitus without complications: Secondary | ICD-10-CM | POA: Diagnosis not present

## 2021-10-24 DIAGNOSIS — Z7902 Long term (current) use of antithrombotics/antiplatelets: Secondary | ICD-10-CM | POA: Diagnosis not present

## 2021-10-24 DIAGNOSIS — Z79899 Other long term (current) drug therapy: Secondary | ICD-10-CM | POA: Insufficient documentation

## 2021-10-24 DIAGNOSIS — Z7901 Long term (current) use of anticoagulants: Secondary | ICD-10-CM | POA: Insufficient documentation

## 2021-10-24 DIAGNOSIS — I428 Other cardiomyopathies: Secondary | ICD-10-CM | POA: Diagnosis not present

## 2021-10-24 DIAGNOSIS — I2510 Atherosclerotic heart disease of native coronary artery without angina pectoris: Secondary | ICD-10-CM | POA: Insufficient documentation

## 2021-10-24 DIAGNOSIS — I447 Left bundle-branch block, unspecified: Secondary | ICD-10-CM

## 2021-10-24 DIAGNOSIS — I4892 Unspecified atrial flutter: Secondary | ICD-10-CM | POA: Insufficient documentation

## 2021-10-24 DIAGNOSIS — Z86711 Personal history of pulmonary embolism: Secondary | ICD-10-CM | POA: Insufficient documentation

## 2021-10-24 DIAGNOSIS — I5022 Chronic systolic (congestive) heart failure: Secondary | ICD-10-CM | POA: Diagnosis not present

## 2021-10-24 DIAGNOSIS — Z955 Presence of coronary angioplasty implant and graft: Secondary | ICD-10-CM | POA: Insufficient documentation

## 2021-10-24 DIAGNOSIS — Z950 Presence of cardiac pacemaker: Secondary | ICD-10-CM | POA: Diagnosis not present

## 2021-10-24 DIAGNOSIS — E785 Hyperlipidemia, unspecified: Secondary | ICD-10-CM | POA: Insufficient documentation

## 2021-10-24 DIAGNOSIS — I110 Hypertensive heart disease with heart failure: Secondary | ICD-10-CM | POA: Diagnosis not present

## 2021-10-24 NOTE — Patient Instructions (Signed)
Medication Changes: ? ?None.  ? ?Follow-Up in: as needed. Continue cardiac care with Dr. Martinique. ? ?At the [facility], you and your health needs are our priority. We have a designated team specialized in the treatment of Heart Failure. This Care Team includes your primary Heart Failure Specialized Cardiologist (physician), Advanced Practice Providers (APPs- Physician Assistants and Nurse Practitioners), and Pharmacist who all work together to provide you with the care you need, when you need it.  ? ?You may see any of the following providers on your designated Care Team at your next follow up: ? ?Dr Glori Bickers ?Dr Loralie Champagne ?Darrick Grinder, NP ?Lyda Jester, PA ?Jessica Milford,NP ?Marlyce Huge, PA ?Audry Riles, PharmD ? ? ?Please be sure to bring in all your medications bottles to every appointment.  ? ?Need to Contact us: ? ?If you have any questions or concerns before your next appointment please send [place] a message through [place] or call our office at [phone].   ? ?TO LEAVE A MESSAGE FOR THE NURSE SELECT OPTION 2, PLEASE LEAVE A MESSAGE INCLUDING: ?YOUR NAME ?DATE OF BIRTH ?CALL BACK NUMBER ?REASON FOR CALL**this is important as we prioritize the call backs ? ?YOU WILL RECEIVE A CALL BACK THE SAME DAY AS LONG AS YOU CALL BEFORE 4:00 PM ? ? ?

## 2021-10-24 NOTE — Progress Notes (Addendum)
Advanced Heart Failure Clinic Note   Referring Physician: Dr. 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 with history of chronic systolic CHF with recovery in LV function, suspected mixed cardiomyopathy, symptomatic bradycardia in setting of LBBB s/p pacemaker 2006, atrial flutter s/p ablation, DM II, HLD, orthostatic hypotension, HTN, Hx PE on lifelong coumadin, CAD with occlusion p LAD on CTA June 2019.   Previously followed by Dr. Gala Romney in HF clinic. Last seen in 2015. He has been followed by [facility] Cardiology and EP. EF 20-25% on echo in 2012, subsequently improved to 45-50% in 2015.  Echo 11/20: EF 40-45%  Echo 01/22: EF 20-25%, severe LVH of basal-septal segment, RV okay. Seen by Dr. Graciela Husbands. Concern LBBB with increased RV pacing contributing to decline in EF. Pacemaker upgraded to CRT- P 03/21.  Echo 08/22: EF 30-35%  Seen by Dr. Clifton James for urgent visit 02/23 d/t chest pain. Bisoprolol stopped and imdur was added. Cardiac cath demonstrated CTO of proximal LAD with collaterals. Ranexa and plavix added. He was seen by Dr. Swaziland and underwent IVUS guided PCI/DES CTO of LAD in 03/23. Post-procedure course c/b hypotension requiring IV fluids and small left-sided RP bleed.  Echo 03/23: EF 55-60%, moderate hypertrophy basal septum with otherwise mild LVH, RV okay  We last saw him in 2015. Referred back to the HF Clinic for further evaluation of his cardiomyopathy.   He is accompanied by his wife. No recurrent angina since PCI. Denies dyspnea, orthopnea or PND. May notice some leg edema towards end of day after he has been sitting several hours. This has usually resolved by morning. He is back to walking 4 miles a day. Taking all medicines as prescribed and tolerating them well.  Review of Systems: [y] = yes, [ ]  = no   General: Weight gain [ ] ; Weight loss [  ]; Anorexia [ ] ; Fatigue [Y]; Fever [ ] ; Chills [ ] ; Weakness [ ]   Cardiac: Chest pain/pressure [ ] ; Resting SOB [ ] ; Exertional SOB [ ] ; Orthopnea [ ] ; Pedal Edema [Y]; Palpitations [ ] ; Syncope [ ] ; Presyncope [ ] ; Paroxysmal nocturnal dyspnea[ ]   Pulmonary: Cough [ ] ; Wheezing[ ] ; Hemoptysis[ ] ; Sputum [ ] ; Snoring [ ]   GI: Vomiting[ ] ; Dysphagia[ ] ; Melena[ ] ; Hematochezia [ ] ; Heartburn[ ] ; Abdominal pain [ ] ; Constipation [ ] ; Diarrhea [ ] ; BRBPR [ ]   GU: Hematuria[ ] ; Dysuria [ ] ; Nocturia[ ]   Vascular: Pain in legs with walking [ ] ; Pain in feet with lying flat [ ] ; Non-healing sores [ ] ; Stroke [ ] ; TIA [ ] ; Slurred speech [ ] ;  Neuro: Headaches[ ] ; Vertigo[ ] ; Seizures[ ] ; Paresthesias[ ] ;Blurred vision [ ] ; Diplopia [ ] ; Vision changes [ ]   Ortho/Skin: Arthritis [ ] ; Joint pain [ ] ; Muscle pain [ ] ; Joint swelling [ ] ; Back Pain [ ] ; Rash [ ]   Psych: Depression[ ] ; Anxiety[ ]   Heme: Bleeding problems [ ] ; Clotting disorders [ ] ; Anemia [ ]   Endocrine: Diabetes [Y]; Thyroid dysfunction[ ]    Past Medical History:  Diagnosis Date   Arthritis    Atrial flutter (HCC)    RFCA    BPH (benign prostatic hypertrophy)    Cardiomyopathy, nonischemic -resolved    Echo 2008: EF 25% Cardiac CT 2009: Normal cors EF 31% Echo 3/10 EF 45% Echo 6/13: EF 50-55%     CHF (congestive  heart failure) (HCC)    Chicken pox    Colon polyps    hyperplastic   Diabetes mellitus (HCC)    ED (erectile dysfunction)    Gilbert's syndrome    possible   Hemorrhoids    Herniated cervical disc    HLD (hyperlipidemia)    HTN (hypertension)    LBBB (left bundle branch block)    Orthostatic hypotension    Pacemaker  Medtronic    DOI 2/06   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HCC)    Pulmonary embolism (HCC)    Sinus node dysfunction (HCC)         Current Outpatient Medications  Medication Sig Dispense Refill   Calcium Carb-Cholecalciferol (CALCIUM 600+D3 PO) Take 5,000 mg by mouth daily.     cholecalciferol  (VITAMIN D) 1000 UNITS tablet Take 1,000 Units by mouth daily.     clopidogrel (PLAVIX) 75 MG tablet Take 1 tablet (75 mg total) by mouth daily. 90 tablet 3   Cyanocobalamin (VITAMIN B-12) 1000 MCG SUBL Place 1,000 mcg under the tongue daily.     docusate sodium (COLACE) 100 MG capsule Take 100 mg by mouth 2 (two) times daily.     finasteride (PROSCAR) 5 MG tablet Take 5 mg by mouth every evening.     metoprolol succinate (TOPROL-XL) 25 MG 24 hr tablet Take by mouth daily.     mirtazapine (REMERON) 15 MG tablet Take 15 mg by mouth at bedtime.     nitroGLYCERIN (NITROSTAT) 0.4 MG SL tablet Place 0.4 mg under the tongue every 5 (five) minutes as needed for chest pain.     Omega-3 Fatty Acids (FISH OIL) 1200 MG CAPS Take 2,400 mg by mouth daily.     polyethylene glycol (MIRALAX / GLYCOLAX) 17 g packet Take 17 g by mouth daily.     Probiotic Product (ALIGN PO) Take 4 mg by mouth daily.     PSYLLIUM PO Take 10 mLs by mouth daily. Citrucel powder     rosuvastatin (CRESTOR) 20 MG tablet Take 20 mg by mouth daily.     sildenafil (REVATIO) 20 MG tablet Take 100 mg by mouth daily as needed (erectile dysfunction).     tamsulosin (FLOMAX) 0.4 MG CAPS capsule Take 0.4 mg by mouth every morning.      warfarin (COUMADIN) 5 MG tablet 7.5 mg every sunday, monday,  wed, and friday. and 5 mg every saturday, tues, and thursday     No current facility-administered medications for this encounter.    Allergies  Allergen Reactions   Ezetimibe Other (See Comments)    Unknown      Social History   Socioeconomic History   Marital status: Married    Spouse name: Not on file   Number of children: 4   Years of education: Not on file   Highest education level: Not on file  Occupational History   Occupation: lawyer    Employer: SELF-EMPLOYED  Tobacco Use   Smoking status: Former    Types: Cigarettes    Quit date: 10/17/1970    Years since quitting: 51.0   Smokeless tobacco: Never  Vaping Use   Vaping  Use: Never used  Substance and Sexual Activity   Alcohol use: Yes    Comment: Drink every day   Drug use: No   Sexual activity: Not on file  Other Topics Concern   Not on file  Social History Narrative   Not on file   Social Determinants of Health   Financial Resource Strain: Not  on file  Food Insecurity: Not on file  Transportation Needs: Not on file  Physical Activity: Not on file  Stress: Not on file  Social Connections: Not on file  Intimate Partner Violence: Not on file      Family History  Problem Relation Age of Onset   Breast cancer Mother    Colon polyps Father        pre-cancerous   Bladder Cancer Father     Vitals:   10/24/21 1201  BP: 118/70  Pulse: 60  SpO2: 94%  Weight: 79.8 kg (176 lb)     PHYSICAL EXAM: General:  Ambulated into clinic. Appears younger than stated age. HEENT: normal Neck: supple. no JVD. Carotids 2+ bilat; no bruits.  Cor: PMI nondisplaced. Regular rate & rhythm. No rubs, gallops or murmurs. Lungs: clear Abdomen: soft, nontender, nondistended.  Extremities: no cyanosis, clubbing, rash, edema Neuro: alert & oriented x 3, cranial nerves grossly intact. moves all 4 extremities w/o difficulty. Affect pleasant.  ECG: AV paced 61 bpm  Pacemaker interrogation: OptiVol index below threshold, activity about 4 hrs/day, ~ 75% BiV pacing   ASSESSMENT & PLAN: Chronic systolic CHF with recovery in LV function: -Suspect mixed ischemic/NICM. Hx cardiomyopathy dates back to 2000s.  -Echo 2012: EF 20-25%  -Echo 2015: EF 45-50% -Echo 11/20: EF 40-45% -Echo 01/22: EF 20-25%, severe LVH of basal-septal segment, RV okay.  -Pacemaker upgraded to CRT-P 03/22 -Echo 08/22: EF 30-35% -s/p PCI/DES to CTO LAD 03/23 -Echo 03/23: EF 55-60%, moderate hypertrophy basal septum, RV okay -NYHA II. Volume stable on exam. Not requiring loop diuretic -Continue Toprol XL 25 mg daily -Entresto stopped in March d/t hypotension. Could consider adding back in  future but would need to use caution with history of low BP  2. CAD: -s/p PCI/DES to CTO LAD 03/23 -On plavix as single antiplatelet d/t need for anticoagulation -Continue statin -No recurrent angina. Off imdur and ranexa. -Managed by Dr. Swaziland  3. LBBB: -s/p CRT- P -Followed by Dr. Graciela Husbands  4. Atrial flutter s/p ablation  5. HX PE: -On lifelong anticoagulation with coumadin   Follow-up: Given recovery in LV function, can f/u in HF clinic as needed. He will continue to follow with Dr. Swaziland for cardiac care  [facility], Dalbert Garnet, PA-C 10/24/21   Patient seen and examined with the above-signed Advanced Practice Provider and/or Housestaff. I personally reviewed laboratory data, imaging studies and relevant notes. I independently examined the patient and formulated the important aspects of the plan. I have edited the note to reflect any of my changes or salient points. I have personally discussed the plan with the patient and/or family.  87 y/o male with h/o LBBB and LBBB cardiomyopathy s/p CRT-P. Recently found to have CTO of LAD and underwent successful PCI of CTO.   Now feels great. Active without CP or SOB. EF has normalized  General:  Well appearing. No resp difficulty HEENT: normal Neck: supple. no JVD. Carotids 2+ bilat; no bruits. No lymphadenopathy or thryomegaly appreciated. Cor: PMI nondisplaced. Regular rate & rhythm. No rubs, gallops or murmurs. Lungs: clear Abdomen: soft, nontender, nondistended. No hepatosplenomegaly. No bruits or masses. Good bowel sounds. Extremities: no cyanosis, clubbing, rash, edema Neuro: alert & orientedx3, cranial nerves grossly intact. moves all 4 extremities w/o difficulty. Affect pleasant  Doing well. EF normal. No evidence of HF. Pacer interrogated personally. > 95% bivpacing.   Continue current regimen. F/u with Dr. Swaziland.   Arvilla Meres, MD  2:36 PM

## 2021-10-31 DIAGNOSIS — H2513 Age-related nuclear cataract, bilateral: Secondary | ICD-10-CM | POA: Diagnosis not present

## 2021-11-07 DIAGNOSIS — Z7901 Long term (current) use of anticoagulants: Secondary | ICD-10-CM | POA: Diagnosis not present

## 2021-11-22 ENCOUNTER — Encounter: Payer: Self-pay | Admitting: Internal Medicine

## 2021-11-22 ENCOUNTER — Ambulatory Visit (INDEPENDENT_AMBULATORY_CARE_PROVIDER_SITE_OTHER): Payer: Medicare Other | Admitting: Internal Medicine

## 2021-11-22 VITALS — BP 110/64 | HR 65 | Ht 75.0 in | Wt 177.4 lb

## 2021-11-22 DIAGNOSIS — D649 Anemia, unspecified: Secondary | ICD-10-CM

## 2021-11-22 DIAGNOSIS — I255 Ischemic cardiomyopathy: Secondary | ICD-10-CM

## 2021-11-22 DIAGNOSIS — I5022 Chronic systolic (congestive) heart failure: Secondary | ICD-10-CM | POA: Diagnosis not present

## 2021-11-22 DIAGNOSIS — I495 Sick sinus syndrome: Secondary | ICD-10-CM | POA: Diagnosis not present

## 2021-11-22 DIAGNOSIS — I447 Left bundle-branch block, unspecified: Secondary | ICD-10-CM | POA: Diagnosis not present

## 2021-11-22 LAB — CUP PACEART INCLINIC DEVICE CHECK
Date Time Interrogation Session: 20230606162849
Implantable Lead Implant Date: 20060223
Implantable Lead Implant Date: 20060223
Implantable Lead Implant Date: 20220309
Implantable Lead Location: 753858
Implantable Lead Location: 753859
Implantable Lead Location: 753860
Implantable Lead Model: 4598
Implantable Lead Model: 5076
Implantable Lead Model: 5076
Implantable Pulse Generator Implant Date: 20220309

## 2021-11-22 NOTE — Patient Instructions (Addendum)

## 2021-11-22 NOTE — Progress Notes (Signed)
Patient Care Team: Crist Infante, MD as PCP - General Deboraha Sprang, MD as PCP - Electrophysiology (Cardiology) [place]ique, Peter M, MD as PCP - Cardiology (Cardiology)   HPI  Steven Macdonald is a 87 y.o. male Seen in followup for AFlutter s/p RFCA He has had no recurrent arrhythmia.  He had a previously implanted dual-chamber Medtronic pacemaker 2006  for sinus node dysfunction; Generator replacement 11/15 and CRT upgrade 3/21 because of progressive exercise intolerance with only modest post implantation improvement;  6/22 electrocardiographic guided reprogramming, lengthening AV delay and adding LV offset of +40 to shorten QRS duration.  Discontinued the propafenone with some trepidation given his hypotension and modest LV dysfunction  History of syncope (11/20) triggered by atrial arrhythmias and he was treated with propafenone for rhythm control; this was discontinued because of persistent LV dysfunction, albeit with some trepidation.  Delene Loll was added  Cardiac CT demonstrated LAD occlusion (per the notes from Dr. Jennette Bill) by whom he was seen who started him on low-dose bisoprolol he has done much better with this with less shortness of breath    Developed more chest pain and underwent cath and PCI of CTO-LAD   much less pain less dyspnea and more energy Scant edema  no orthopnea     He has a history of pulmonary embolism and is on Coumadin.     Date Cr K Hgb  10/18   14.5   11/20 0.99 4.0 14.2  3/22 1.13 4.6 15.1  3/23 1.16 4.0 11     DATE TEST EF    10/09 CTA   Ca score 0  12/13 Echo   25 %    1/15 Myoview   Multiple perfusion defects   1/15 Echo   45-50 %    10/16 Echo  45-50%    11/20 Echo  40-45%    6/21 CTA   4.1cm Aortic root  01/22 Echo 20-25%    8/22 Echo  40-45%   2/23 LHC  CTO LAD>>PCI   3/223 Echo  55-60%          Past Medical History:  Diagnosis Date   Arthritis    Atrial flutter ([place])    RFCA    BPH (benign prostatic hypertrophy)     Cardiomyopathy, nonischemic -resolved    Echo 2008: EF 25% Cardiac CT 2009: Normal cors EF 31% Echo 3/10 EF 45% Echo 6/13: EF 50-55%     CHF (congestive heart failure) (HCC)    Chicken pox    Colon polyps    hyperplastic   Diabetes mellitus ([place])    ED (erectile dysfunction)    Gilbert's syndrome    possible   Hemorrhoids    Herniated cervical disc    HLD (hyperlipidemia)    HTN (hypertension)    LBBB (left bundle branch block)    Orthostatic hypotension    Pacemaker  Medtronic    DOI 2/06   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[place])    Pulmonary embolism ([place])    Sinus node dysfunction ([place])         Past Surgical History:  Procedure Laterality Date   BIV UPGRADE N/A 08/25/2020   Procedure: BIV PPM UPGRADE;  Surgeon: Deboraha Sprang, MD;  Location: [place] [facility];  Service: Cardiovascular;  Laterality: N/A;   CARDIOVERSION     COLONOSCOPY WITH PROPOFOL N/A 01/20/2021   Procedure: COLONOSCOPY WITH PROPOFOL;  Surgeon: Irene Shipper, MD;  Location: [facility];  Service: Endoscopy;  Laterality:  N/A;   coronary ablation     CORONARY CTO INTERVENTION N/A 09/07/2021   Procedure: CORONARY CTO INTERVENTION;  Surgeon: Martinique, Peter M, MD;  Location: [place] [facility];  Service: Cardiovascular;  Laterality: N/A;   CYSTOSCOPY W/ TRANSURETHRAL RESECTION OF POSTERIOR URETHERAL VALVES     INTRAVASCULAR ULTRASOUND/IVUS N/A 09/07/2021   Procedure: Intravascular Ultrasound/IVUS;  Surgeon: Martinique, Peter M, MD;  Location: [place] [facility];  Service: Cardiovascular;  Laterality: N/A;   LEFT HEART CATH AND CORONARY ANGIOGRAPHY N/A 08/02/2021   Procedure: LEFT HEART CATH AND CORONARY ANGIOGRAPHY;  Surgeon: Leonie Man, MD;  Location: [place] [facility];  Service: Cardiovascular;  Laterality: N/A;   PACEMAKER GENERATOR CHANGE N/A 05/13/2014   Procedure: PACEMAKER GENERATOR CHANGE;  Surgeon: Deboraha Sprang, MD;  Location: [facility];  Service: Cardiovascular;  Laterality: N/A;   PACEMAKER INSERTION      POLYPECTOMY  01/20/2021   Procedure: POLYPECTOMY;  Surgeon: Irene Shipper, MD;  Location: [facility];  Service: Endoscopy;;    Current Outpatient Medications  Medication Sig Dispense Refill   Calcium Carb-Cholecalciferol (CALCIUM 600+D3 PO) Take 5,000 mg by mouth daily.     cholecalciferol (VITAMIN D) 1000 UNITS tablet Take 1,000 Units by mouth daily.     clopidogrel (PLAVIX) 75 MG tablet Take 1 tablet (75 mg total) by mouth daily. 90 tablet 3   Cyanocobalamin (VITAMIN B-12) 1000 MCG SUBL Place 1,000 mcg under the tongue daily.     docusate sodium (COLACE) 100 MG capsule Take 100 mg by mouth 2 (two) times daily.     finasteride (PROSCAR) 5 MG tablet Take 5 mg by mouth every evening.     metoprolol succinate (TOPROL-XL) 25 MG 24 hr tablet Take by mouth daily.     mirtazapine (REMERON) 15 MG tablet Take 15 mg by mouth at bedtime.     nitroGLYCERIN (NITROSTAT) 0.4 MG SL tablet Place 0.4 mg under the tongue every 5 (five) minutes as needed for chest pain.     Omega-3 Fatty Acids (FISH OIL) 1200 MG CAPS Take 2,400 mg by mouth daily.     polyethylene glycol (MIRALAX / GLYCOLAX) 17 g packet Take 17 g by mouth daily.     Probiotic Product (ALIGN PO) Take 4 mg by mouth daily.     PSYLLIUM PO Take 10 mLs by mouth daily. Citrucel powder     rosuvastatin (CRESTOR) 20 MG tablet Take 20 mg by mouth daily.     sildenafil (REVATIO) 20 MG tablet Take 100 mg by mouth daily as needed (erectile dysfunction).     tamsulosin (FLOMAX) 0.4 MG CAPS capsule Take 0.4 mg by mouth every morning. Monday, Tuesday, Friday     warfarin (COUMADIN) 5 MG tablet Sat, Wednes, Sun     No current facility-administered medications for this visit.    Allergies  Allergen Reactions   Ezetimibe Other (See Comments)    Unknown    Review of Systems negative except from HPI and PMH  Physical Exam BP 110/64   Pulse 65   Ht '6\' 3"'$  (1.905 m)   Wt 177 lb 6.4 oz (80.5 kg)   SpO2 96%   BMI 22.17 kg/m  Well developed and well  nourished in no acute distress HENT normal Neck supple with JVP-flat Clear Device pocket well healed; without hematoma or erythema.  There is no tethering  Regular rate and rhythm, no  murmur Abd-soft with active BS No Clubbing cyanosis  edema Skin-warm and dry A &  Oriented  Grossly normal sensory and motor function  ECG AV pacing Negative QRS lead 1 and upright QRS lead V1 QRSd 155   Assessment and  Plan  Sinus node dysfunction  Pacemaker-Medtronic . CRT    Nonischemic pacemaker cardiomyopathy-resolved  CHF chronic diastolic   LBBB   Pulmonary Embolism  Chronic therapy  Depression  Syncope  2/2 atrial tachycardia >> propafenone   CTO s/p PCI     Dyspnea much improved  No bleeding on anticoagulation  Device function normal  LV vector changed to LV1-can to keep output below doubler

## 2021-11-28 DIAGNOSIS — Z7901 Long term (current) use of anticoagulants: Secondary | ICD-10-CM | POA: Diagnosis not present

## 2021-12-05 ENCOUNTER — Ambulatory Visit (INDEPENDENT_AMBULATORY_CARE_PROVIDER_SITE_OTHER): Payer: Medicare Other

## 2021-12-05 DIAGNOSIS — I495 Sick sinus syndrome: Secondary | ICD-10-CM

## 2021-12-07 LAB — CUP PACEART REMOTE DEVICE CHECK
Battery Remaining Longevity: 81 mo
Battery Voltage: 2.99 V
Brady Statistic AP VP Percent: 88.73 %
Brady Statistic AP VS Percent: 0.78 %
Brady Statistic AS VP Percent: 6.55 %
Brady Statistic AS VS Percent: 3.94 %
Brady Statistic RA Percent Paced: 89.84 %
Brady Statistic RV Percent Paced: 95.28 %
Date Time Interrogation Session: 20230618234506
Implantable Lead Implant Date: 20060223
Implantable Lead Implant Date: 20060223
Implantable Lead Implant Date: 20220309
Implantable Lead Location: 753858
Implantable Lead Location: 753859
Implantable Lead Location: 753860
Implantable Lead Model: 4598
Implantable Lead Model: 5076
Implantable Lead Model: 5076
Implantable Pulse Generator Implant Date: 20220309
Lead Channel Impedance Value: 323 Ohm
Lead Channel Impedance Value: 380 Ohm
Lead Channel Impedance Value: 380 Ohm
Lead Channel Impedance Value: 399 Ohm
Lead Channel Impedance Value: 475 Ohm
Lead Channel Impedance Value: 475 Ohm
Lead Channel Impedance Value: 513 Ohm
Lead Channel Impedance Value: 532 Ohm
Lead Channel Impedance Value: 646 Ohm
Lead Channel Impedance Value: 665 Ohm
Lead Channel Impedance Value: 665 Ohm
Lead Channel Impedance Value: 931 Ohm
Lead Channel Impedance Value: 931 Ohm
Lead Channel Impedance Value: 931 Ohm
Lead Channel Pacing Threshold Amplitude: 0.625 V
Lead Channel Pacing Threshold Amplitude: 0.625 V
Lead Channel Pacing Threshold Amplitude: 1.125 V
Lead Channel Pacing Threshold Pulse Width: 0.4 ms
Lead Channel Pacing Threshold Pulse Width: 0.4 ms
Lead Channel Pacing Threshold Pulse Width: 1.2 ms
Lead Channel Sensing Intrinsic Amplitude: 2.25 mV
Lead Channel Sensing Intrinsic Amplitude: 2.25 mV
Lead Channel Sensing Intrinsic Amplitude: 6.875 mV
Lead Channel Sensing Intrinsic Amplitude: 6.875 mV
Lead Channel Setting Pacing Amplitude: 1.5 V
Lead Channel Setting Pacing Amplitude: 2.25 V
Lead Channel Setting Pacing Amplitude: 2.5 V
Lead Channel Setting Pacing Pulse Width: 0.4 ms
Lead Channel Setting Pacing Pulse Width: 1.2 ms
Lead Channel Setting Sensing Sensitivity: 1.2 mV

## 2021-12-12 DIAGNOSIS — Z7952 Long term (current) use of systemic steroids: Secondary | ICD-10-CM | POA: Diagnosis not present

## 2021-12-12 DIAGNOSIS — Z125 Encounter for screening for malignant neoplasm of prostate: Secondary | ICD-10-CM | POA: Diagnosis not present

## 2021-12-12 DIAGNOSIS — R82998 Other abnormal findings in urine: Secondary | ICD-10-CM | POA: Diagnosis not present

## 2021-12-12 DIAGNOSIS — R7301 Impaired fasting glucose: Secondary | ICD-10-CM | POA: Diagnosis not present

## 2021-12-12 DIAGNOSIS — E291 Testicular hypofunction: Secondary | ICD-10-CM | POA: Diagnosis not present

## 2021-12-12 DIAGNOSIS — Z7901 Long term (current) use of anticoagulants: Secondary | ICD-10-CM | POA: Diagnosis not present

## 2021-12-12 DIAGNOSIS — I10 Essential (primary) hypertension: Secondary | ICD-10-CM | POA: Diagnosis not present

## 2021-12-12 DIAGNOSIS — F419 Anxiety disorder, unspecified: Secondary | ICD-10-CM | POA: Diagnosis not present

## 2021-12-12 DIAGNOSIS — E785 Hyperlipidemia, unspecified: Secondary | ICD-10-CM | POA: Diagnosis not present

## 2021-12-12 DIAGNOSIS — R5383 Other fatigue: Secondary | ICD-10-CM | POA: Diagnosis not present

## 2021-12-19 DIAGNOSIS — Z23 Encounter for immunization: Secondary | ICD-10-CM | POA: Diagnosis not present

## 2021-12-19 DIAGNOSIS — F329 Major depressive disorder, single episode, unspecified: Secondary | ICD-10-CM | POA: Diagnosis not present

## 2021-12-19 DIAGNOSIS — D6869 Other thrombophilia: Secondary | ICD-10-CM | POA: Diagnosis not present

## 2021-12-19 DIAGNOSIS — Z7901 Long term (current) use of anticoagulants: Secondary | ICD-10-CM | POA: Diagnosis not present

## 2021-12-19 DIAGNOSIS — N182 Chronic kidney disease, stage 2 (mild): Secondary | ICD-10-CM | POA: Diagnosis not present

## 2021-12-19 DIAGNOSIS — I2510 Atherosclerotic heart disease of native coronary artery without angina pectoris: Secondary | ICD-10-CM | POA: Diagnosis not present

## 2021-12-19 DIAGNOSIS — E785 Hyperlipidemia, unspecified: Secondary | ICD-10-CM | POA: Diagnosis not present

## 2021-12-19 DIAGNOSIS — Z0000 Encounter for general adult medical examination without abnormal findings: Secondary | ICD-10-CM | POA: Diagnosis not present

## 2021-12-19 DIAGNOSIS — Z1331 Encounter for screening for depression: Secondary | ICD-10-CM | POA: Diagnosis not present

## 2021-12-19 DIAGNOSIS — I130 Hypertensive heart and chronic kidney disease with heart failure and stage 1 through stage 4 chronic kidney disease, or unspecified chronic kidney disease: Secondary | ICD-10-CM | POA: Diagnosis not present

## 2021-12-19 DIAGNOSIS — Z950 Presence of cardiac pacemaker: Secondary | ICD-10-CM | POA: Diagnosis not present

## 2021-12-19 DIAGNOSIS — I77810 Thoracic aortic ectasia: Secondary | ICD-10-CM | POA: Diagnosis not present

## 2021-12-19 DIAGNOSIS — Z1389 Encounter for screening for other disorder: Secondary | ICD-10-CM | POA: Diagnosis not present

## 2021-12-19 DIAGNOSIS — I509 Heart failure, unspecified: Secondary | ICD-10-CM | POA: Diagnosis not present

## 2021-12-27 NOTE — Progress Notes (Signed)
Remote pacemaker transmission.   

## 2021-12-27 NOTE — Progress Notes (Signed)
Cardiology Office Note   Date:  01/02/2022   ID:  Steven Macdonald, DOB 09-Jun-1935, MRN [id number]  PCP:  Crist Infante, MD  Cardiologist:   Dicy Smigel Martinique, MD   Chief Complaint  Patient presents with   Coronary Artery Disease      History of Present Illness: Steven Macdonald is a 87 y.o. male who is seen at the request of Dr Gasper Sells for consideration of CTO PCI of the LAD. He is an 87 yo male with history of atrial flutter s/p ablation, cardiomyopathy. chronic diastolic CHF, DM, hyperlipidemia, HTN, LBBB, orthostatic hypotension, sinus node dysfunction s/p pacemaker placement, CAD and PE on lifelong coumadin therapy. He is followed in our office by Dr. Caryl Comes and has also been seen by Dr. Gasper Sells. He is known to have CAD with coronary CTA in June 2019 showing occlusion of the proximal LAD. Minimal disease in the RCA and Circumflex. Echo August 2022 with LVEF=30-35% with global hypokinesis, mild AI. He has been on a beta blocker and Entresto. He is on coumadin therapy given history of PE and atrial flutter. He was seen in Feb by Dr Angelena Form as a work in for evaluation of chest pain.  Bisoprolol was stopped and Imdur 15 mg daily was started.  He was scheduled for cardiac cath with bridging Lovenox. Cardiac cath was performed by Dr Ellyn Hack on 08/02/21. This showed a CTO of the proximal LAD with collaterals. Ranexa was added for therapy and Plavix.   He noted the medication has helped but he still has significant chest pain with exertion. Able to walk only a mile whereas before he was able to walk 3 miles. Some DOE. No palpitations. No bleeding.   He presented on 09/07/2021 for planned CTO PCI of LAD as stated above. PCI of the LAD was successful and DES was placed using IVUS guidance. He tolerated the procedure well but was noted to be hypotensive following procedure requiring fluids. Hypotension persisted throughout the night. Bedside Echo showed no evidence of pericardial effusion. He was  noted to have almost a 4 g/dL drop in her hemoglobin from 14.4 to 10.5. He was asymptomatic with this though. Abdominal/pelvic CT was ordered and revealed a mild left-sided retroperitoneal hemorrhage, and mild hemorrhage within the left lower quadrant abdominal wall soft tissue. Formal Echo on 3/23 showed normal LV function with no mention of pericardial effusion. Home Coumadin was held. Hemoglobin stable at 10.3 on day of discharge. Initial plan was for triple therapy with Aspirin, Plavix, and Coumadin for 1 month at which time Aspirin can be stopped. However, given retroperitoneal bleed, will hold Coumadin for 1 week. Given PCI to LAD, will have to continue Aspirin and Plavix.  Imdur and Ranexa were discontinued. Hgb improved to 11.0 on outpatient follow up. Entresto also discontinued due to hypotension.  Seen in May by Advanced heart failure and released. Seen by Dr Caryl Comes in June and doing well.   On follow up today he is doing very well. Is walking up to 4 miles a day without angina or dyspnea. Hgb has fully recovered to 16. Notes occasional "jump" in his left chest.    Past Medical History:  Diagnosis Date   Arthritis    Atrial flutter ([place])    RFCA    BPH (benign prostatic hypertrophy)    Cardiomyopathy, nonischemic -resolved    Echo 2008: EF 25% Cardiac CT 2009: Normal cors EF 31% Echo 3/10 EF 45% Echo 6/13: EF 50-55%     CHF (  congestive heart failure) (HCC)    Chicken pox    Colon polyps    hyperplastic   Diabetes mellitus ([place])    ED (erectile dysfunction)    Gilbert's syndrome    possible   Hemorrhoids    Herniated cervical disc    HLD (hyperlipidemia)    HTN (hypertension)    LBBB (left bundle branch block)    Orthostatic hypotension    Pacemaker  Medtronic    DOI 2/06   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[place])    Pulmonary embolism ([place])    Sinus node dysfunction ([place])         Past Surgical History:  Procedure Laterality Date   BIV UPGRADE N/A 08/25/2020   Procedure: BIV PPM  UPGRADE;  Surgeon: Deboraha Sprang, MD;  Location: [facility];  Service: Cardiovascular;  Laterality: N/A;   CARDIOVERSION     COLONOSCOPY WITH PROPOFOL N/A 01/20/2021   Procedure: COLONOSCOPY WITH PROPOFOL;  Surgeon: Irene Shipper, MD;  Location: [facility];  Service: Endoscopy;  Laterality: N/A;   coronary ablation     CORONARY CTO INTERVENTION N/A 09/07/2021   Procedure: CORONARY CTO INTERVENTION;  Surgeon: Martinique, Sigurd Pugh M, MD;  Location: [facility];  Service: Cardiovascular;  Laterality: N/A;   CYSTOSCOPY W/ TRANSURETHRAL RESECTION OF POSTERIOR URETHERAL VALVES     INTRAVASCULAR ULTRASOUND/IVUS N/A 09/07/2021   Procedure: Intravascular Ultrasound/IVUS;  Surgeon: Martinique, Theodora Lalanne M, MD;  Location: [facility];  Service: Cardiovascular;  Laterality: N/A;   LEFT HEART CATH AND CORONARY ANGIOGRAPHY N/A 08/02/2021   Procedure: LEFT HEART CATH AND CORONARY ANGIOGRAPHY;  Surgeon: Leonie Man, MD;  Location: [facility];  Service: Cardiovascular;  Laterality: N/A;   PACEMAKER GENERATOR CHANGE N/A 05/13/2014   Procedure: PACEMAKER GENERATOR CHANGE;  Surgeon: Deboraha Sprang, MD;  Location: [facility];  Service: Cardiovascular;  Laterality: N/A;   PACEMAKER INSERTION     POLYPECTOMY  01/20/2021   Procedure: POLYPECTOMY;  Surgeon: Irene Shipper, MD;  Location: [facility];  Service: Endoscopy;;     Current Outpatient Medications  Medication Sig Dispense Refill   Calcium Carb-Cholecalciferol (CALCIUM 600+D3 PO) Take 5,000 mg by mouth daily.     cholecalciferol (VITAMIN D) 1000 UNITS tablet Take 1,000 Units by mouth daily.     clopidogrel (PLAVIX) 75 MG tablet Take 1 tablet (75 mg total) by mouth daily. 90 tablet 3   Cyanocobalamin (VITAMIN B-12) 1000 MCG SUBL Place 1,000 mcg under the tongue daily.     docusate sodium (COLACE) 100 MG capsule Take 100 mg by mouth 2 (two) times daily.     finasteride (PROSCAR) 5 MG tablet Take 5 mg by mouth every evening.     metoprolol  succinate (TOPROL-XL) 25 MG 24 hr tablet Take by mouth daily.     mirtazapine (REMERON) 15 MG tablet Take 15 mg by mouth at bedtime.     nitroGLYCERIN (NITROSTAT) 0.4 MG SL tablet Place 0.4 mg under the tongue every 5 (five) minutes as needed for chest pain.     Omega-3 Fatty Acids (FISH OIL) 1200 MG CAPS Take 2,400 mg by mouth daily.     polyethylene glycol (MIRALAX / GLYCOLAX) 17 g packet Take 17 g by mouth daily.     Probiotic Product (ALIGN PO) Take 4 mg by mouth daily.     PSYLLIUM PO Take 10 mLs by mouth daily. Citrucel powder     rosuvastatin (CRESTOR) 20 MG tablet Take 20 mg by mouth  daily.     sildenafil (REVATIO) 20 MG tablet Take 100 mg by mouth daily as needed (erectile dysfunction).     tamsulosin (FLOMAX) 0.4 MG CAPS capsule Take 0.4 mg by mouth every morning. Monday, Tuesday, Friday     warfarin (COUMADIN) 5 MG tablet Sat, Wednes, Sun     No current facility-administered medications for this visit.    Allergies:   Ezetimibe    Social History:  The patient  reports that he quit smoking about 51 years ago. His smoking use included cigarettes. He has never used smokeless tobacco. He reports current alcohol use. He reports that he does not use drugs.   Family History:  The patient's family history includes Bladder Cancer in his father; Breast cancer in his mother; Colon polyps in his father.    ROS:  Please see the history of present illness.   Otherwise, review of systems are positive for none.   All other systems are reviewed and negative.    PHYSICAL EXAM: VS:  BP 116/78   Pulse 64   Ht '6\' 3"'$  (1.905 m)   Wt 175 lb (79.4 kg)   SpO2 94%   BMI 21.87 kg/m  , BMI Body mass index is 21.87 kg/m. GEN: Well nourished, well developed, in no acute distress HEENT: normal Neck: no JVD, carotid bruits, or masses Cardiac: RRR; no murmurs, rubs, or gallops,no edema  Respiratory:  clear to auscultation bilaterally, normal work of breathing GI: soft, nontender, nondistended, +  BS MS: no deformity or atrophy Skin: warm and dry, no rash Neuro:  Strength and sensation are intact Psych: euthymic mood, full affect   EKG:  EKG is not ordered today. The ekg ordered today demonstrates N/A   Recent Labs: 05/05/2021: NT-Pro BNP 124 09/08/2021: BUN 16; Creatinine, Ser 1.16; Potassium 4.0; Sodium 137 09/12/2021: Hemoglobin 11.0; Platelets 143    Lipid Panel    Component Value Date/Time   CHOL 130 08/31/2021 1120   TRIG 81 08/31/2021 1120   HDL 54 08/31/2021 1120   CHOLHDL 2.4 08/31/2021 1120   LDLCALC 60 08/31/2021 1120     Dated 10/22/20: cholesterol 135, triglycerides 63, HDL 53, LDL 69.  Dated 05/04/21: A1c 5.5%. LFTs normal. Dated 12/05/21: cholesterol 119, triglycerides 63, HDL 39, LDL 67. CMET and CBC normal. A1c 5.6%. normal TSH.  Wt Readings from Last 3 Encounters:  01/02/22 175 lb (79.4 kg)  11/22/21 177 lb 6.4 oz (80.5 kg)  10/24/21 176 lb (79.8 kg)      Other studies Reviewed: Additional studies/ records that were reviewed today include:   Coronary CTA 10/24/17: ADDENDUM REPORT: 10/24/2017 17:01   CLINICAL DATA:  Chest pain   EXAM: Cardiac CTA   MEDICATIONS: Sub lingual nitro.  '4mg'$  x 2 and lopressor '5mg'$  IV   TECHNIQUE: The patient was scanned on a Siemens 621 slice scanner. Gantry rotation speed was 250 msecs. Collimation was 0.6 mm. A 100 kV prospective scan was triggered in the ascending thoracic aorta at 35-75% of the R-R interval. Average HR during the scan was 60 bpm. The 3D data set was interpreted on a dedicated work station using MPR, MIP and VRT modes. A total of 80cc of contrast was used.   FINDINGS: Non-cardiac: See separate report from [facility].   Pacemaker lead in right heart.   Calcium Score: 20 Agatston units.   Coronary Arteries: Right dominant with no anomalies   LM: No plaque or stenosis.   LAD system: The proximal LAD appears to be  subtotally occluded.   Circumflex system: Small-moderate ramus,  no significant disease. No plaque or stenosis in LCx system.   RCA system: Mixed plaque proximal RCA, no significant stenosis.   IMPRESSION: 1. Coronary artery calcium score 20 Agatston units. This places the patient in the 8th percentile for age/gender, suggesting low risk for future cardiac events.   2. The proximal LAD appears subtotally occluded. Will confirm by FFR.   Dalton Mclean     Electronically Signed   By: Loralie Champagne M.D.   On: 10/24/2017 17:01  CLINICAL DATA:  Chest pain   EXAM: CT FFR   MEDICATIONS: No additional medications.   TECHNIQUE: The coronary CTA was sent for CT FFR.   FINDINGS: FFR 0.89 distal RCA FFR 0.86 distal LCx   The proximal LAD appears occluded. There is collateral filling distally.   IMPRESSION: Occluded proximal LAD.   Dalton Mclean     Electronically Signed   By: Loralie Champagne M.D.   On: 10/25/2017 14:32    Echo 02/09/21: IMPRESSIONS     1. Moderate hypertrophy of the basal septum with otherwise mild  concentric LVH. Global hypokinesis worse in the septum. Compared with the  echo 11/6438, systolic function has improved. Left ventricular ejection  fraction, by estimation, is 30 to 35%. The  left ventricle has moderately decreased function. The left ventricle  demonstrates global hypokinesis. There is moderate left ventricular  hypertrophy. Left ventricular diastolic parameters are consistent with  Grade I diastolic dysfunction (impaired  relaxation).   2. Right ventricular systolic function is normal. The right ventricular  size is normal.   3. The mitral valve is normal in structure. Trivial mitral valve  regurgitation. No evidence of mitral stenosis.   4. The aortic valve is normal in structure. Aortic valve regurgitation is  mild. No aortic stenosis is present.   5. Aortic dilatation noted. There is mild dilatation of the aortic root,  measuring 39 mm. There is mild dilatation of the ascending aorta,   measuring 42 mm.   6. The inferior vena cava is normal in size with greater than 50%  respiratory variability, suggesting right atrial pressure of 3 mmHg.   LEFT HEART CATH AND CORONARY ANGIOGRAPHY   Conclusion      Mid LAD lesion is 100% stenosed. ->  Fills via multiple septal perforator and distal PDA collaterals all the way to the occlusion site.  Favorable for CTO PCI.   LV end diastolic pressure is low.   Very tortuous right brachial artery, required versa core wire catheter steering to manipulate   Summary Severe single-vessel CAD with CTO of the proximal to mid LAD with collateral flow filling the majority of the LAD from the RPDA septal perforators. Otherwise angiographically normal coronary arteries with a codominant system.     Recommendations: We will add Plavix after loading 300 mg prior to discharge Also start Ranexa 500 g twice daily He will be discharged home today and plan for staged PCI (CTO PCI) with Dr. Martinique on March 22     Glenetta Hew, MD   Diagnostic Dominance: Co-dominant Interve Conclusion      Mid LAD lesion is 100% stenosed.   A drug-eluting stent was successfully placed using a SYNERGY XD 3.0X48.   Post intervention, there is a 0% residual stenosis.   Successful CTO PCI of the LAD with IVUS guidance and DES x 1     Plan: observe overnight. Plan to resume coumadin tonight without bridging. ASA 81 mg x  1 month. Plavix 75 mg daily for at least 6 months.    Intervention  Intervention    Echo 09/08/21: IMPRESSIONS     1. Moderate hypertrophy of the basal septum with otherwise mild LVH.  Compared with the echo 67/5916, systolic function has improved. Left  ventricular ejection fraction, by estimation, is 55 to 60%. The left  ventricle has normal function. There is  moderate left ventricular hypertrophy of the basal-septal segment. Left  ventricular diastolic parameters are consistent with Grade I diastolic  dysfunction (impaired  relaxation).   2. Right ventricular systolic function is normal. The right ventricular  size is normal.   3. The mitral valve is normal in structure. Trivial mitral valve  regurgitation. No evidence of mitral stenosis.   4. The aortic valve is tricuspid. There is mild calcification of the  aortic valve. There is mild thickening of the aortic valve. Aortic valve  regurgitation is trivial.   5. Aortic dilatation noted. There is mild dilatation of the aortic root,  measuring 38 mm. There is mild dilatation of the ascending aorta,  measuring 38 mm.   ASSESSMENT AND PLAN:  1.  CAD Now s/p successful CTO PCI with DES in March 2023. Procedure complicated by  left sided retroperitoneal bleed- now resolved. LV function with marked improvement post PCI with normal EF now. Anginal symptoms have resolved. Continue Coumadin and Plavix. Plan to stop Plavix at one year and continue Coumadin monotherapy.  2. Chronic systolic CHF with ischemic CM. EF substantially improved following PCI and is now normal. Now only on low dose Toprol.  3. History of PE on chronic coumadin.  4. Sinus node dysfunction s/p PPM/BIV ICD. Stable device function. 5. Hyperlipidemia. On statin. Goal LDL < 70.    Current medicines are reviewed at length with the patient today.  The patient does not have concerns regarding medicines.  The following changes have been made:  no change  Labs/ tests ordered today include:   No orders of the defined types were placed in this encounter.        Disposition:   FU with me 6 months   Signed, Adonai Selsor Martinique, MD  01/02/2022 11:02 AM    [facility] [address], [place], [place], [address] Phone [phone], Fax [phone]

## 2021-12-29 IMAGING — DX DG CHEST 2V
2 series · 2 of 2 positions shown · non-contrast
Comparison: August 25, 2020

CLINICAL DATA: Evaluate for pacemaker placement.

EXAM:
CHEST - 2 VIEW

[dg chest 2 view (1 of 2)]
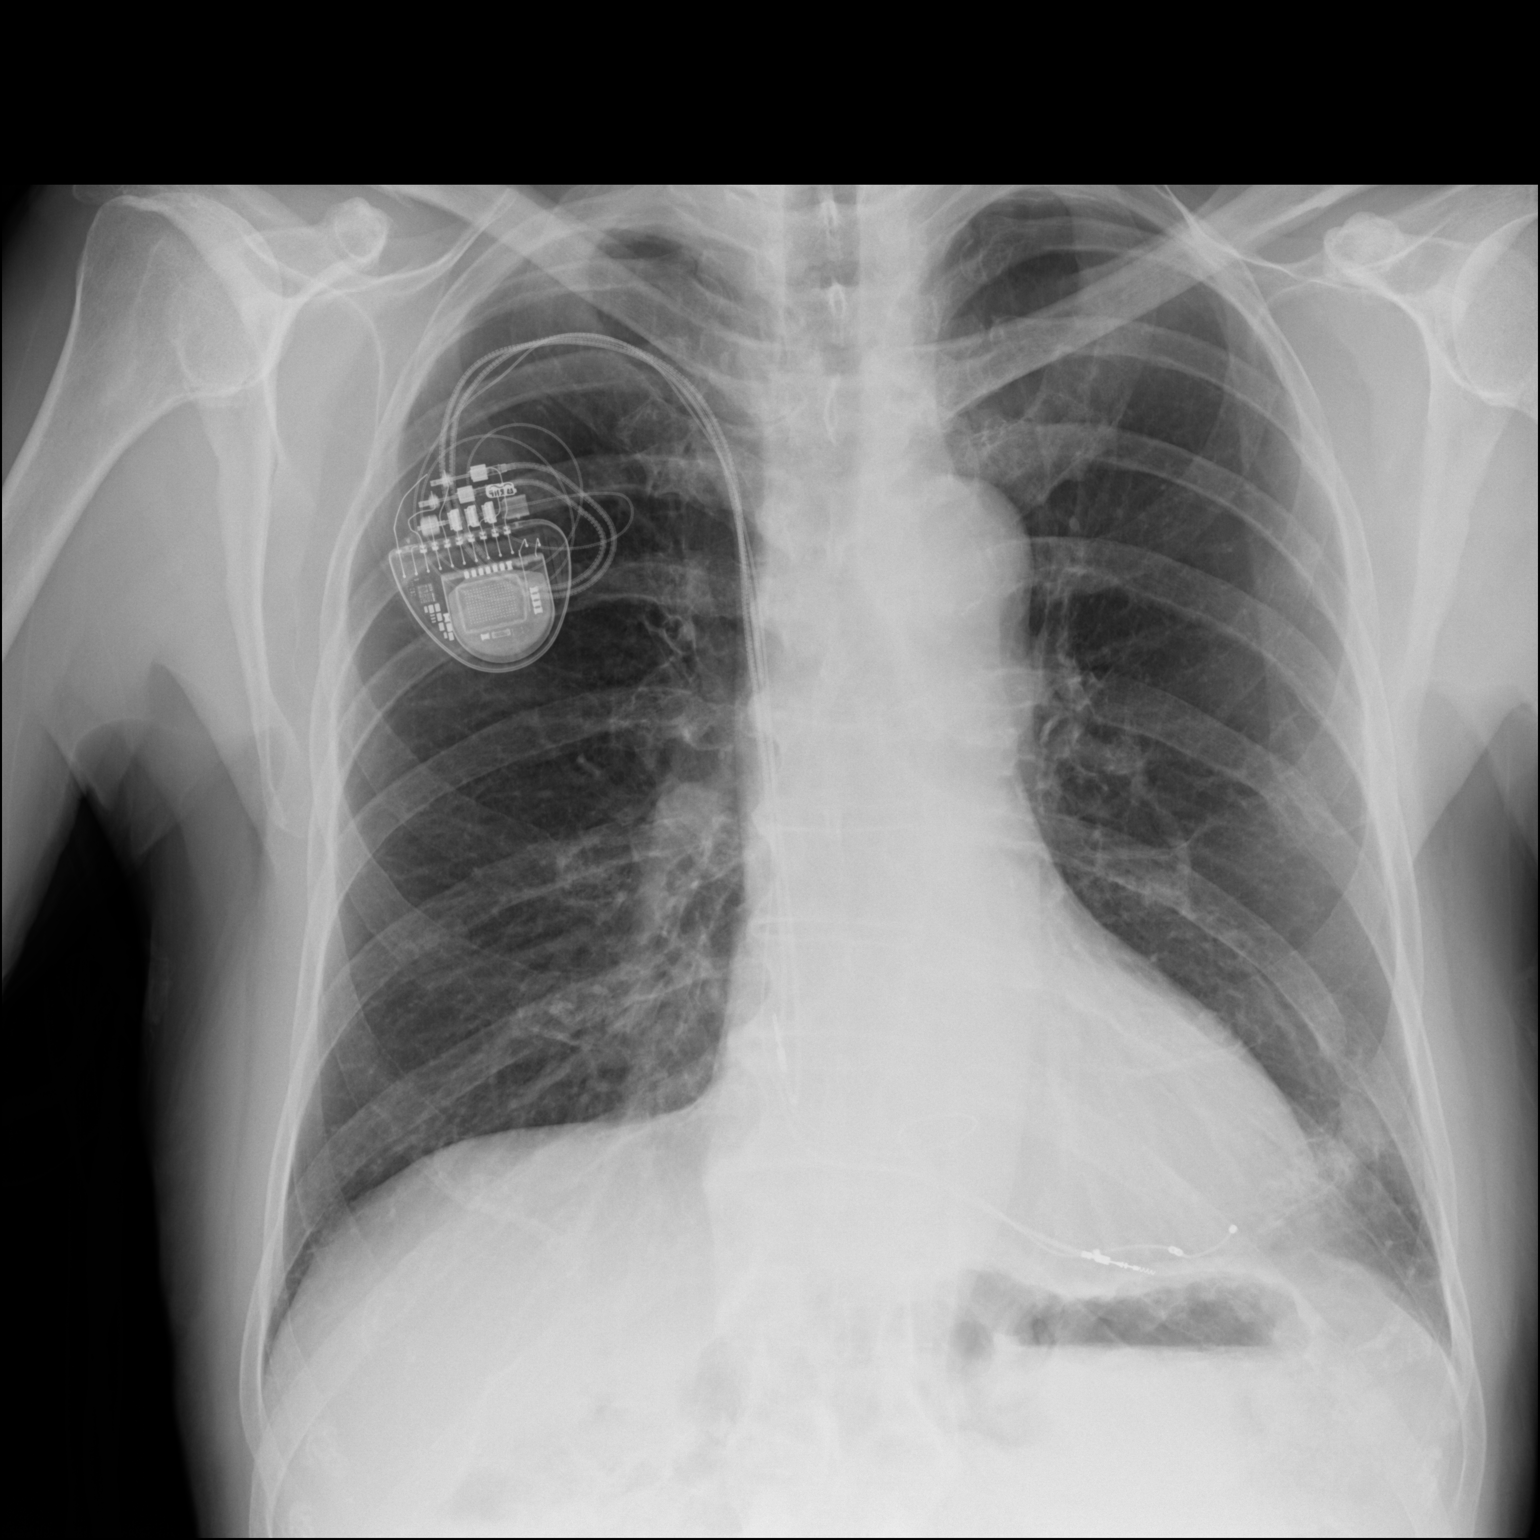

[dg chest 2 view (2 of 2)]
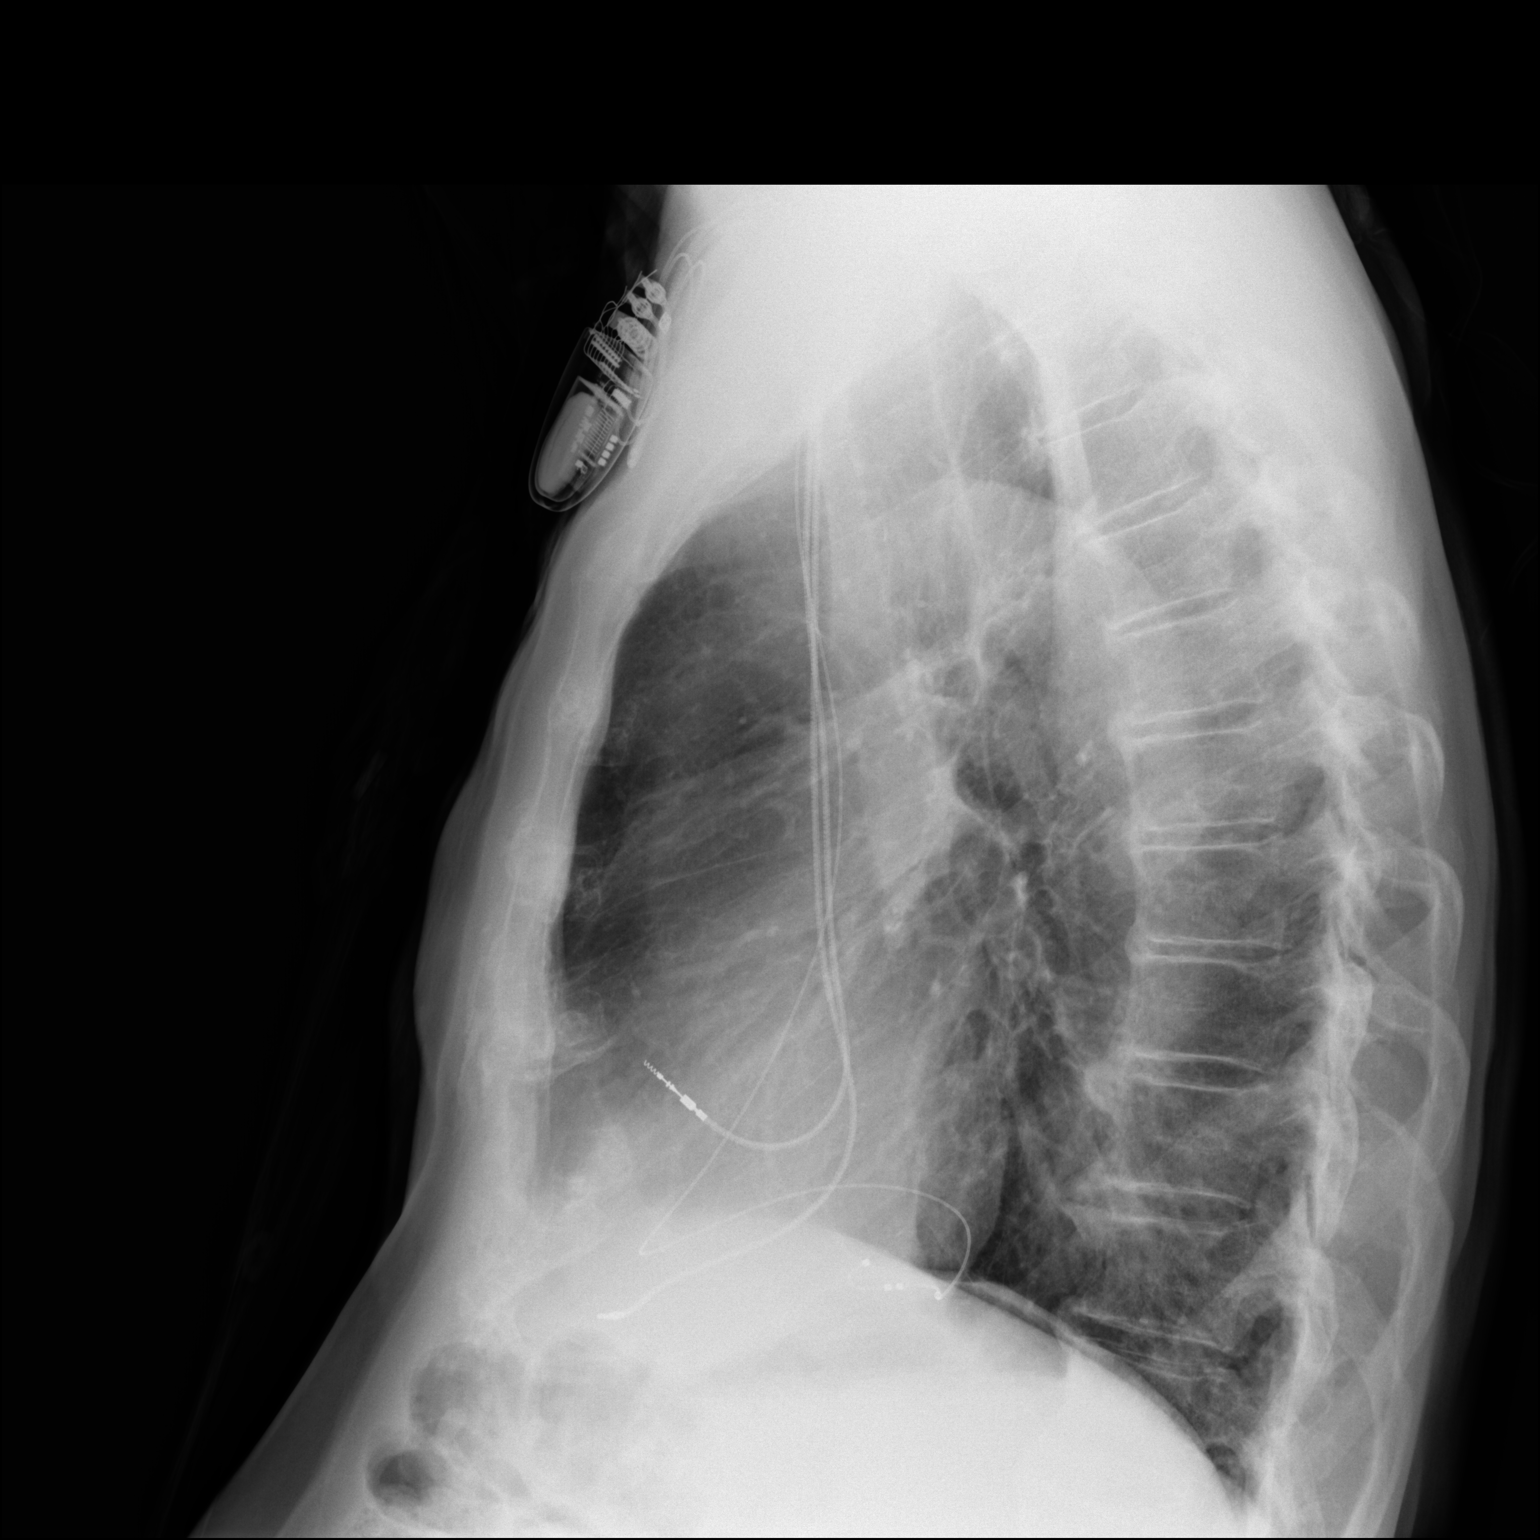

[2 of 2 positions shown; findings below may reference images not displayed]

FINDINGS: There is a multi lead AICD. Appropriate lead positioning is noted.
Very mild, stable scarring and/or atelectasis is seen within the
lateral aspect of the left lung base. There is no evidence of a
pleural effusion or pneumothorax. The heart size and mediastinal
contours are within normal limits. The visualized skeletal
structures are unremarkable.
IMPRESSION: 1. Multi lead AICD with appropriate lead positioning.
2. No acute cardiopulmonary disease.

## 2022-01-02 ENCOUNTER — Encounter: Payer: Self-pay | Admitting: Cardiology

## 2022-01-02 ENCOUNTER — Ambulatory Visit (INDEPENDENT_AMBULATORY_CARE_PROVIDER_SITE_OTHER): Payer: Medicare Other | Admitting: Cardiology

## 2022-01-02 VITALS — BP 116/78 | HR 64 | Ht 75.0 in | Wt 175.0 lb

## 2022-01-02 DIAGNOSIS — I495 Sick sinus syndrome: Secondary | ICD-10-CM | POA: Diagnosis not present

## 2022-01-02 DIAGNOSIS — I25110 Atherosclerotic heart disease of native coronary artery with unstable angina pectoris: Secondary | ICD-10-CM | POA: Diagnosis not present

## 2022-01-02 DIAGNOSIS — I5022 Chronic systolic (congestive) heart failure: Secondary | ICD-10-CM

## 2022-01-02 DIAGNOSIS — I255 Ischemic cardiomyopathy: Secondary | ICD-10-CM

## 2022-01-02 DIAGNOSIS — I447 Left bundle-branch block, unspecified: Secondary | ICD-10-CM

## 2022-01-02 NOTE — Patient Instructions (Signed)
Medication Instructions:  No Changes *If you need a refill on your cardiac medications before your next appointment, please call your pharmacy*   Lab Work: No Labs If you have labs (blood work) drawn today and your tests are completely normal, you will receive your results only by: [place] (if you have MyChart) OR A paper copy in the mail If you have any lab test that is abnormal or we need to change your treatment, we will call you to review the results.   Testing/Procedures: No Testing   Follow-Up: At [facility], you and your health needs are our priority.  As part of our continuing mission to provide you with exceptional heart care, we have created designated Provider Care Teams.  These Care Teams include your primary Cardiologist (physician) and Advanced Practice Providers (APPs -  Physician Assistants and Nurse Practitioners) who all work together to provide you with the care you need, when you need it.  We recommend signing up for the patient portal called "MyChart".  Sign up information is provided on this After Visit Summary.  MyChart is used to connect with patients for Virtual Visits (Telemedicine).  Patients are able to view lab/test results, encounter notes, upcoming appointments, etc.  Non-urgent messages can be sent to your provider as well.   To learn more about what you can do with MyChart, go to [email or web address].    Your next appointment:   6 months  The format for your next appointment:   In Person  Provider:   Peter Martinique, MD       Important Information About Sugar

## 2022-01-03 DIAGNOSIS — N138 Other obstructive and reflux uropathy: Secondary | ICD-10-CM | POA: Diagnosis not present

## 2022-01-03 DIAGNOSIS — R972 Elevated prostate specific antigen [PSA]: Secondary | ICD-10-CM | POA: Diagnosis not present

## 2022-01-03 DIAGNOSIS — N401 Enlarged prostate with lower urinary tract symptoms: Secondary | ICD-10-CM | POA: Diagnosis not present

## 2022-01-16 DIAGNOSIS — Z7901 Long term (current) use of anticoagulants: Secondary | ICD-10-CM | POA: Diagnosis not present

## 2022-01-30 DIAGNOSIS — Z7901 Long term (current) use of anticoagulants: Secondary | ICD-10-CM | POA: Diagnosis not present

## 2022-02-07 DIAGNOSIS — Z7901 Long term (current) use of anticoagulants: Secondary | ICD-10-CM | POA: Diagnosis not present

## 2022-02-07 DIAGNOSIS — L820 Inflamed seborrheic keratosis: Secondary | ICD-10-CM | POA: Diagnosis not present

## 2022-02-07 DIAGNOSIS — D485 Neoplasm of uncertain behavior of skin: Secondary | ICD-10-CM | POA: Diagnosis not present

## 2022-02-07 DIAGNOSIS — Z85828 Personal history of other malignant neoplasm of skin: Secondary | ICD-10-CM | POA: Diagnosis not present

## 2022-02-07 DIAGNOSIS — L720 Epidermal cyst: Secondary | ICD-10-CM | POA: Diagnosis not present

## 2022-03-06 ENCOUNTER — Ambulatory Visit (INDEPENDENT_AMBULATORY_CARE_PROVIDER_SITE_OTHER): Payer: Medicare Other

## 2022-03-06 DIAGNOSIS — I5022 Chronic systolic (congestive) heart failure: Secondary | ICD-10-CM | POA: Diagnosis not present

## 2022-03-07 LAB — CUP PACEART REMOTE DEVICE CHECK
Battery Remaining Longevity: 74 mo
Battery Voltage: 2.98 V
Brady Statistic AP VP Percent: 87.93 %
Brady Statistic AP VS Percent: 0.79 %
Brady Statistic AS VP Percent: 7.18 %
Brady Statistic AS VS Percent: 4.09 %
Brady Statistic RA Percent Paced: 89.4 %
Brady Statistic RV Percent Paced: 95.11 %
Date Time Interrogation Session: 20230918050607
Implantable Lead Implant Date: 20060223
Implantable Lead Implant Date: 20060223
Implantable Lead Implant Date: 20220309
Implantable Lead Location: 753858
Implantable Lead Location: 753859
Implantable Lead Location: 753860
Implantable Lead Model: 4598
Implantable Lead Model: 5076
Implantable Lead Model: 5076
Implantable Pulse Generator Implant Date: 20220309
Lead Channel Impedance Value: 342 Ohm
Lead Channel Impedance Value: 380 Ohm
Lead Channel Impedance Value: 399 Ohm
Lead Channel Impedance Value: 399 Ohm
Lead Channel Impedance Value: 475 Ohm
Lead Channel Impedance Value: 494 Ohm
Lead Channel Impedance Value: 494 Ohm
Lead Channel Impedance Value: 532 Ohm
Lead Channel Impedance Value: 570 Ohm
Lead Channel Impedance Value: 665 Ohm
Lead Channel Impedance Value: 665 Ohm
Lead Channel Impedance Value: 798 Ohm
Lead Channel Impedance Value: 817 Ohm
Lead Channel Impedance Value: 817 Ohm
Lead Channel Pacing Threshold Amplitude: 0.5 V
Lead Channel Pacing Threshold Amplitude: 0.625 V
Lead Channel Pacing Threshold Amplitude: 1.5 V
Lead Channel Pacing Threshold Pulse Width: 0.4 ms
Lead Channel Pacing Threshold Pulse Width: 0.4 ms
Lead Channel Pacing Threshold Pulse Width: 1.2 ms
Lead Channel Sensing Intrinsic Amplitude: 2.125 mV
Lead Channel Sensing Intrinsic Amplitude: 2.125 mV
Lead Channel Sensing Intrinsic Amplitude: 7 mV
Lead Channel Sensing Intrinsic Amplitude: 7 mV
Lead Channel Setting Pacing Amplitude: 1.5 V
Lead Channel Setting Pacing Amplitude: 2.25 V
Lead Channel Setting Pacing Amplitude: 2.5 V
Lead Channel Setting Pacing Pulse Width: 0.4 ms
Lead Channel Setting Pacing Pulse Width: 1.2 ms
Lead Channel Setting Sensing Sensitivity: 1.2 mV

## 2022-03-13 DIAGNOSIS — Z7901 Long term (current) use of anticoagulants: Secondary | ICD-10-CM | POA: Diagnosis not present

## 2022-03-21 NOTE — Progress Notes (Signed)
Remote pacemaker transmission.   

## 2022-03-25 DIAGNOSIS — Z23 Encounter for immunization: Secondary | ICD-10-CM | POA: Diagnosis not present

## 2022-04-05 DIAGNOSIS — Z23 Encounter for immunization: Secondary | ICD-10-CM | POA: Diagnosis not present

## 2022-04-20 DIAGNOSIS — Z7901 Long term (current) use of anticoagulants: Secondary | ICD-10-CM | POA: Diagnosis not present

## 2022-05-19 DIAGNOSIS — Z7901 Long term (current) use of anticoagulants: Secondary | ICD-10-CM | POA: Diagnosis not present

## 2022-06-05 ENCOUNTER — Ambulatory Visit: Payer: Medicare Other | Admitting: Cardiology

## 2022-06-05 ENCOUNTER — Ambulatory Visit (INDEPENDENT_AMBULATORY_CARE_PROVIDER_SITE_OTHER): Payer: Medicare Other

## 2022-06-05 DIAGNOSIS — I495 Sick sinus syndrome: Secondary | ICD-10-CM | POA: Diagnosis not present

## 2022-06-06 LAB — CUP PACEART REMOTE DEVICE CHECK
Battery Remaining Longevity: 78 mo
Battery Voltage: 2.98 V
Brady Statistic AP VP Percent: 88.28 %
Brady Statistic AP VS Percent: 0.76 %
Brady Statistic AS VP Percent: 7.51 %
Brady Statistic AS VS Percent: 3.45 %
Brady Statistic RA Percent Paced: 89.68 %
Brady Statistic RV Percent Paced: 95.79 %
Date Time Interrogation Session: 20231218052430
Implantable Lead Connection Status: 753985
Implantable Lead Connection Status: 753985
Implantable Lead Connection Status: 753985
Implantable Lead Implant Date: 20060223
Implantable Lead Implant Date: 20060223
Implantable Lead Implant Date: 20220309
Implantable Lead Location: 753858
Implantable Lead Location: 753859
Implantable Lead Location: 753860
Implantable Lead Model: 4598
Implantable Lead Model: 5076
Implantable Lead Model: 5076
Implantable Pulse Generator Implant Date: 20220309
Lead Channel Impedance Value: 1026 Ohm
Lead Channel Impedance Value: 1045 Ohm
Lead Channel Impedance Value: 1064 Ohm
Lead Channel Impedance Value: 342 Ohm
Lead Channel Impedance Value: 418 Ohm
Lead Channel Impedance Value: 418 Ohm
Lead Channel Impedance Value: 456 Ohm
Lead Channel Impedance Value: 494 Ohm
Lead Channel Impedance Value: 513 Ohm
Lead Channel Impedance Value: 513 Ohm
Lead Channel Impedance Value: 589 Ohm
Lead Channel Impedance Value: 703 Ohm
Lead Channel Impedance Value: 741 Ohm
Lead Channel Impedance Value: 760 Ohm
Lead Channel Pacing Threshold Amplitude: 0.5 V
Lead Channel Pacing Threshold Amplitude: 0.5 V
Lead Channel Pacing Threshold Amplitude: 1.25 V
Lead Channel Pacing Threshold Pulse Width: 0.4 ms
Lead Channel Pacing Threshold Pulse Width: 0.4 ms
Lead Channel Pacing Threshold Pulse Width: 1.2 ms
Lead Channel Sensing Intrinsic Amplitude: 2.25 mV
Lead Channel Sensing Intrinsic Amplitude: 2.25 mV
Lead Channel Sensing Intrinsic Amplitude: 6.875 mV
Lead Channel Sensing Intrinsic Amplitude: 6.875 mV
Lead Channel Setting Pacing Amplitude: 1.5 V
Lead Channel Setting Pacing Amplitude: 2.25 V
Lead Channel Setting Pacing Amplitude: 2.5 V
Lead Channel Setting Pacing Pulse Width: 0.4 ms
Lead Channel Setting Pacing Pulse Width: 1.2 ms
Lead Channel Setting Sensing Sensitivity: 1.2 mV
Zone Setting Status: 755011

## 2022-06-23 DIAGNOSIS — Z7901 Long term (current) use of anticoagulants: Secondary | ICD-10-CM | POA: Diagnosis not present

## 2022-07-04 NOTE — Progress Notes (Signed)
Cardiology Office Note   Date:  07/14/2022   ID:  Steven Macdonald, DOB 01-05-1935, MRN [id number]  PCP:  Crist Infante, MD  Cardiologist:   Natalio Salois Martinique, MD   Chief Complaint  Patient presents with   Coronary Artery Disease      History of Present Illness: Steven Macdonald is a 88 y.o. male who is seen for follow up CAD.  He has a history of atrial flutter s/p ablation, cardiomyopathy. chronic diastolic CHF, DM, hyperlipidemia, HTN, LBBB, orthostatic hypotension, sinus node dysfunction s/p pacemaker placement, CAD and PE on lifelong coumadin therapy. He is followed in our office by Dr. Caryl Comes and has also been seen by Dr. Gasper Sells. He is known to have CAD with coronary CTA in June 2019 showing occlusion of the proximal LAD. Minimal disease in the RCA and Circumflex. Echo August 2022 with LVEF=30-35% with global hypokinesis, mild AI. He has been on a beta blocker and Entresto. He is on coumadin therapy given history of PE and atrial flutter. He was seen in Feb 2023 by Dr Angelena Form as a work in for evaluation of chest pain.  Bisoprolol was stopped and Imdur 15 mg daily was started.  He was scheduled for cardiac cath with bridging Lovenox. Cardiac cath was performed by Dr Ellyn Hack on 08/02/21. This showed a CTO of the proximal LAD with collaterals. Ranexa was added for therapy and Plavix.   He noted the medication has helped but he still has significant chest pain with exertion. Able to walk only a mile whereas before he was able to walk 3 miles. Some DOE. No palpitations. No bleeding.   He presented on 09/07/2021 for planned CTO PCI of LAD.  PCI of the LAD was successful and DES was placed using IVUS guidance. He tolerated the procedure well but was noted to be hypotensive following procedure requiring fluids. Hypotension persisted throughout the night. Bedside Echo showed no evidence of pericardial effusion. He was noted to have almost a 4 g/dL drop in her hemoglobin from 14.4 to 10.5. He was  asymptomatic with this though. Abdominal/pelvic CT was ordered and revealed a mild left-sided retroperitoneal hemorrhage, and mild hemorrhage within the left lower quadrant abdominal wall soft tissue. Formal Echo on 3/23 showed normal LV function with no mention of pericardial effusion. Home Coumadin was held. Hemoglobin stable at 10.3 on day of discharge. Initial plan was for triple therapy with Aspirin, Plavix, and Coumadin for 1 month at which time Aspirin can be stopped. However, given retroperitoneal bleed, will hold Coumadin for 1 week. Given PCI to LAD, will have to continue Aspirin and Plavix.  Imdur and Ranexa were discontinued. Hgb improved to 11.0 on outpatient follow up. Entresto also discontinued due to hypotension.  Seen in May by [facility] failure and released. Seen by Dr Caryl Comes in June and doing well. When seen by me in July was doing well up to walking 4 miles/day.   He continues to do well. No angina. Still walking 4 miles a day. Notes some bruising but no other bleeding. No dizziness or palpitations. Energy level is good.     Past Medical History:  Diagnosis Date   Arthritis    Atrial flutter ([place])    RFCA    BPH (benign prostatic hypertrophy)    Cardiomyopathy, nonischemic -resolved    Echo 2008: EF 25% Cardiac CT 2009: Normal cors EF 31% Echo 3/10 EF 45% Echo 6/13: EF 50-55%     CHF (congestive heart failure) ([place])  Chicken pox    Colon polyps    hyperplastic   Diabetes mellitus ([place])    ED (erectile dysfunction)    Gilbert's syndrome    possible   Hemorrhoids    Herniated cervical disc    HLD (hyperlipidemia)    HTN (hypertension)    LBBB (left bundle branch block)    Orthostatic hypotension    Pacemaker  Medtronic    DOI 2/06   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[place])    Pulmonary embolism ([facility])    Sinus node dysfunction ([place])         Past Surgical History:  Procedure Laterality Date   BIV UPGRADE N/A 08/25/2020   Procedure: BIV PPM UPGRADE;  Surgeon: Deboraha Sprang, MD;  Location: [facility];  Service: Cardiovascular;  Laterality: N/A;   CARDIOVERSION     COLONOSCOPY WITH PROPOFOL N/A 01/20/2021   Procedure: COLONOSCOPY WITH PROPOFOL;  Surgeon: Irene Shipper, MD;  Location: [facility];  Service: Endoscopy;  Laterality: N/A;   coronary ablation     CORONARY CTO INTERVENTION N/A 09/07/2021   Procedure: CORONARY CTO INTERVENTION;  Surgeon: Martinique, Elmin Wiederholt M, MD;  Location: [facility];  Service: Cardiovascular;  Laterality: N/A;   CYSTOSCOPY W/ TRANSURETHRAL RESECTION OF POSTERIOR URETHERAL VALVES     INTRAVASCULAR ULTRASOUND/IVUS N/A 09/07/2021   Procedure: Intravascular Ultrasound/IVUS;  Surgeon: Martinique, Quatisha Zylka M, MD;  Location: [facility];  Service: Cardiovascular;  Laterality: N/A;   LEFT HEART CATH AND CORONARY ANGIOGRAPHY N/A 08/02/2021   Procedure: LEFT HEART CATH AND CORONARY ANGIOGRAPHY;  Surgeon: Leonie Man, MD;  Location: [facility];  Service: Cardiovascular;  Laterality: N/A;   PACEMAKER GENERATOR CHANGE N/A 05/13/2014   Procedure: PACEMAKER GENERATOR CHANGE;  Surgeon: Deboraha Sprang, MD;  Location: [facility];  Service: Cardiovascular;  Laterality: N/A;   PACEMAKER INSERTION     POLYPECTOMY  01/20/2021   Procedure: POLYPECTOMY;  Surgeon: Irene Shipper, MD;  Location: [facility];  Service: Endoscopy;;     Current Outpatient Medications  Medication Sig Dispense Refill   Calcium Carb-Cholecalciferol (CALCIUM 600+D3 PO) Take 5,000 mg by mouth daily.     cholecalciferol (VITAMIN D) 1000 UNITS tablet Take 1,000 Units by mouth daily.     clopidogrel (PLAVIX) 75 MG tablet Take 1 tablet (75 mg total) by mouth daily. 90 tablet 3   Cyanocobalamin (VITAMIN B-12) 1000 MCG SUBL Place 1,000 mcg under the tongue daily.     docusate sodium (COLACE) 100 MG capsule Take 100 mg by mouth 2 (two) times daily.     finasteride (PROSCAR) 5 MG tablet Take 5 mg by mouth every evening.     metoprolol succinate (TOPROL-XL) 25  MG 24 hr tablet Take by mouth daily.     mirtazapine (REMERON) 15 MG tablet Take 15 mg by mouth at bedtime.     Omega-3 Fatty Acids (FISH OIL) 1200 MG CAPS Take 2,400 mg by mouth daily.     polyethylene glycol (MIRALAX / GLYCOLAX) 17 g packet Take 17 g by mouth daily.     Probiotic Product (ALIGN PO) Take 4 mg by mouth daily.     PSYLLIUM PO Take 10 mLs by mouth daily. Citrucel powder     rosuvastatin (CRESTOR) 20 MG tablet Take 20 mg by mouth daily.     sildenafil (REVATIO) 20 MG tablet Take 100 mg by mouth daily as needed (erectile dysfunction).     tamsulosin (FLOMAX) 0.4 MG CAPS capsule Take 0.4  mg by mouth every morning. Monday, Tuesday, Friday     warfarin (COUMADIN) 5 MG tablet Take 5 mg tablet by mouth on Monday, and Thursday     warfarin (COUMADIN) 7.5 MG tablet Take 7.5 mg by mouth every other day.     No current facility-administered medications for this visit.    Allergies:   Ezetimibe    Social History:  The patient  reports that he quit smoking about 51 years ago. His smoking use included cigarettes. He has never used smokeless tobacco. He reports current alcohol use. He reports that he does not use drugs.   Family History:  The patient's family history includes Bladder Cancer in his father; Breast cancer in his mother; Colon polyps in his father.    ROS:  Please see the history of present illness.   Otherwise, review of systems are positive for none.   All other systems are reviewed and negative.    PHYSICAL EXAM: VS:  BP 100/60   Pulse 64   Ht 6' 2.5" (1.892 m)   Wt 177 lb 3.2 oz (80.4 kg)   SpO2 97%   BMI 22.45 kg/m  , BMI Body mass index is 22.45 kg/m. GEN: Well nourished, well developed, in no acute distress HEENT: normal Neck: no JVD, carotid bruits, or masses Cardiac: RRR; no murmurs, rubs, or gallops,no edema  Respiratory:  clear to auscultation bilaterally, normal work of breathing GI: soft, nontender, nondistended, + BS MS: no deformity or  atrophy Skin: warm and dry, no rash Neuro:  Strength and sensation are intact Psych: euthymic mood, full affect   EKG:  EKG is not ordered today. The ekg ordered today demonstrates N/A   Recent Labs: 09/08/2021: BUN 16; Creatinine, Ser 1.16; Potassium 4.0; Sodium 137 09/12/2021: Hemoglobin 11.0; Platelets 143    Lipid Panel    Component Value Date/Time   CHOL 130 08/31/2021 1120   TRIG 81 08/31/2021 1120   HDL 54 08/31/2021 1120   CHOLHDL 2.4 08/31/2021 1120   LDLCALC 60 08/31/2021 1120     Dated 10/22/20: cholesterol 135, triglycerides 63, HDL 53, LDL 69.  Dated 05/04/21: A1c 5.5%. LFTs normal. Dated 12/05/21: cholesterol 119, triglycerides 63, HDL 39, LDL 67. CMET and CBC normal. A1c 5.6%. normal TSH.  Wt Readings from Last 3 Encounters:  07/14/22 177 lb 3.2 oz (80.4 kg)  01/02/22 175 lb (79.4 kg)  11/22/21 177 lb 6.4 oz (80.5 kg)      Other studies Reviewed: Additional studies/ records that were reviewed today include:   Coronary CTA 10/24/17: ADDENDUM REPORT: 10/24/2017 17:01   CLINICAL DATA:  Chest pain   EXAM: Cardiac CTA   MEDICATIONS: Sub lingual nitro.  '4mg'$  x 2 and lopressor '5mg'$  IV   TECHNIQUE: The patient was scanned on a Siemens 097 slice scanner. Gantry rotation speed was 250 msecs. Collimation was 0.6 mm. A 100 kV prospective scan was triggered in the ascending thoracic aorta at 35-75% of the R-R interval. Average HR during the scan was 60 bpm. The 3D data set was interpreted on a dedicated work station using MPR, MIP and VRT modes. A total of 80cc of contrast was used.   FINDINGS: Non-cardiac: See separate report from [facility].   Pacemaker lead in right heart.   Calcium Score: 20 Agatston units.   Coronary Arteries: Right dominant with no anomalies   LM: No plaque or stenosis.   LAD system: The proximal LAD appears to be subtotally occluded.   Circumflex system: Small-moderate ramus,  no significant disease. No plaque or  stenosis in LCx system.   RCA system: Mixed plaque proximal RCA, no significant stenosis.   IMPRESSION: 1. Coronary artery calcium score 20 Agatston units. This places the patient in the 8th percentile for age/gender, suggesting low risk for future cardiac events.   2. The proximal LAD appears subtotally occluded. Will confirm by FFR.   Dalton Mclean     Electronically Signed   By: Loralie Champagne M.D.   On: 10/24/2017 17:01  CLINICAL DATA:  Chest pain   EXAM: CT FFR   MEDICATIONS: No additional medications.   TECHNIQUE: The coronary CTA was sent for CT FFR.   FINDINGS: FFR 0.89 distal RCA FFR 0.86 distal LCx   The proximal LAD appears occluded. There is collateral filling distally.   IMPRESSION: Occluded proximal LAD.   Dalton Mclean     Electronically Signed   By: Loralie Champagne M.D.   On: 10/25/2017 14:32    Echo 02/09/21: IMPRESSIONS     1. Moderate hypertrophy of the basal septum with otherwise mild  concentric LVH. Global hypokinesis worse in the septum. Compared with the  echo 12/9388, systolic function has improved. Left ventricular ejection  fraction, by estimation, is 30 to 35%. The  left ventricle has moderately decreased function. The left ventricle  demonstrates global hypokinesis. There is moderate left ventricular  hypertrophy. Left ventricular diastolic parameters are consistent with  Grade I diastolic dysfunction (impaired  relaxation).   2. Right ventricular systolic function is normal. The right ventricular  size is normal.   3. The mitral valve is normal in structure. Trivial mitral valve  regurgitation. No evidence of mitral stenosis.   4. The aortic valve is normal in structure. Aortic valve regurgitation is  mild. No aortic stenosis is present.   5. Aortic dilatation noted. There is mild dilatation of the aortic root,  measuring 39 mm. There is mild dilatation of the ascending aorta,  measuring 42 mm.   6. The inferior vena cava  is normal in size with greater than 50%  respiratory variability, suggesting right atrial pressure of 3 mmHg.   LEFT HEART CATH AND CORONARY ANGIOGRAPHY   Conclusion      Mid LAD lesion is 100% stenosed. ->  Fills via multiple septal perforator and distal PDA collaterals all the way to the occlusion site.  Favorable for CTO PCI.   LV end diastolic pressure is low.   Very tortuous right brachial artery, required versa core wire catheter steering to manipulate   Summary Severe single-vessel CAD with CTO of the proximal to mid LAD with collateral flow filling the majority of the LAD from the RPDA septal perforators. Otherwise angiographically normal coronary arteries with a codominant system.     Recommendations: We will add Plavix after loading 300 mg prior to discharge Also start Ranexa 500 g twice daily He will be discharged home today and plan for staged PCI (CTO PCI) with Dr. Martinique on March 22     Glenetta Hew, MD   Diagnostic Dominance: Co-dominant Interve Conclusion      Mid LAD lesion is 100% stenosed.   A drug-eluting stent was successfully placed using a SYNERGY XD [id number].   Post intervention, there is a 0% residual stenosis.   Successful CTO PCI of the LAD with IVUS guidance and DES x 1     Plan: observe overnight. Plan to resume coumadin tonight without bridging. ASA 81 mg x 1 month. Plavix 75 mg daily for at  least 6 months.    Intervention  Intervention    Echo 09/08/21: IMPRESSIONS     1. Moderate hypertrophy of the basal septum with otherwise mild LVH.  Compared with the echo 70/3500, systolic function has improved. Left  ventricular ejection fraction, by estimation, is 55 to 60%. The left  ventricle has normal function. There is  moderate left ventricular hypertrophy of the basal-septal segment. Left  ventricular diastolic parameters are consistent with Grade I diastolic  dysfunction (impaired relaxation).   2. Right ventricular systolic function  is normal. The right ventricular  size is normal.   3. The mitral valve is normal in structure. Trivial mitral valve  regurgitation. No evidence of mitral stenosis.   4. The aortic valve is tricuspid. There is mild calcification of the  aortic valve. There is mild thickening of the aortic valve. Aortic valve  regurgitation is trivial.   5. Aortic dilatation noted. There is mild dilatation of the aortic root,  measuring 38 mm. There is mild dilatation of the ascending aorta,  measuring 38 mm.   ASSESSMENT AND PLAN:  1.  CAD Now s/p successful CTO PCI with DES in March 2023. Procedure complicated by  left sided retroperitoneal bleed- now resolved. LV function with marked improvement post PCI with normal EF.  Anginal symptoms resolved. Continue Coumadin and Plavix. Plan to stop Plavix after March 23 and continue Coumadin monotherapy.  2. Chronic systolic CHF with ischemic CM. EF substantially improved following PCI and is now normal. Now only on low dose Toprol.  3. History of PE on chronic coumadin.  4. Sinus node dysfunction s/p PPM/BIV ICD. Stable device function. 5. Hyperlipidemia. On statin. Goal LDL < 70. Last LDL 67.  6. HCM. Patient does have chronic basal septal hypertrophy. No outflow gradient. On mild end of spectrum.     Disposition:   FU with me 6 months   Signed, Dailyn Kempner Martinique, MD  07/14/2022 2:05 PM    [facility] [address] [place], [place], [address] Phone [phone], Fax [phone]

## 2022-07-13 NOTE — Progress Notes (Signed)
Remote pacemaker transmission.   

## 2022-07-14 ENCOUNTER — Ambulatory Visit: Payer: Medicare Other | Attending: Cardiology | Admitting: Cardiology

## 2022-07-14 ENCOUNTER — Encounter: Payer: Self-pay | Admitting: Cardiology

## 2022-07-14 ENCOUNTER — Other Ambulatory Visit: Payer: Self-pay | Admitting: Cardiology

## 2022-07-14 VITALS — BP 100/60 | HR 64 | Ht 74.5 in | Wt 177.2 lb

## 2022-07-14 DIAGNOSIS — I5022 Chronic systolic (congestive) heart failure: Secondary | ICD-10-CM | POA: Diagnosis not present

## 2022-07-14 DIAGNOSIS — I422 Other hypertrophic cardiomyopathy: Secondary | ICD-10-CM | POA: Insufficient documentation

## 2022-07-14 DIAGNOSIS — Z950 Presence of cardiac pacemaker: Secondary | ICD-10-CM | POA: Insufficient documentation

## 2022-07-14 DIAGNOSIS — I447 Left bundle-branch block, unspecified: Secondary | ICD-10-CM | POA: Diagnosis not present

## 2022-07-14 DIAGNOSIS — I25110 Atherosclerotic heart disease of native coronary artery with unstable angina pectoris: Secondary | ICD-10-CM | POA: Diagnosis not present

## 2022-07-14 NOTE — Patient Instructions (Signed)
Medication Instructions:   STOP taking Plavix after 09/09/22 per Dr. Martinique   *If you need a refill on your cardiac medications before your next appointment, please call your pharmacy*  Lab Work: NONE ordered at this time of appointment   If you have labs (blood work) drawn today and your tests are completely normal, you will receive your results only by: [place] (if you have MyChart) OR A paper copy in the mail If you have any lab test that is abnormal or we need to change your treatment, we will call you to review the results.  Testing/Procedures: NONE ordered at this time of appointment   Follow-Up: At [facility], you and your health needs are our priority.  As part of our continuing mission to provide you with exceptional heart care, we have created designated Provider Care Teams.  These Care Teams include your primary Cardiologist (physician) and Advanced Practice Providers (APPs -  Physician Assistants and Nurse Practitioners) who all work together to provide you with the care you need, when you need it.  Your next appointment:   6 month(s)  Provider:   Peter Martinique, MD     Other Instructions

## 2022-07-20 DIAGNOSIS — E785 Hyperlipidemia, unspecified: Secondary | ICD-10-CM | POA: Diagnosis not present

## 2022-07-20 DIAGNOSIS — I83812 Varicose veins of left lower extremities with pain: Secondary | ICD-10-CM | POA: Diagnosis not present

## 2022-07-20 DIAGNOSIS — N182 Chronic kidney disease, stage 2 (mild): Secondary | ICD-10-CM | POA: Diagnosis not present

## 2022-07-20 DIAGNOSIS — D696 Thrombocytopenia, unspecified: Secondary | ICD-10-CM | POA: Diagnosis not present

## 2022-07-20 DIAGNOSIS — I240 Acute coronary thrombosis not resulting in myocardial infarction: Secondary | ICD-10-CM | POA: Diagnosis not present

## 2022-07-20 DIAGNOSIS — Z7901 Long term (current) use of anticoagulants: Secondary | ICD-10-CM | POA: Diagnosis not present

## 2022-07-20 DIAGNOSIS — R7301 Impaired fasting glucose: Secondary | ICD-10-CM | POA: Diagnosis not present

## 2022-07-20 DIAGNOSIS — F329 Major depressive disorder, single episode, unspecified: Secondary | ICD-10-CM | POA: Diagnosis not present

## 2022-07-20 DIAGNOSIS — Z950 Presence of cardiac pacemaker: Secondary | ICD-10-CM | POA: Diagnosis not present

## 2022-07-20 DIAGNOSIS — I130 Hypertensive heart and chronic kidney disease with heart failure and stage 1 through stage 4 chronic kidney disease, or unspecified chronic kidney disease: Secondary | ICD-10-CM | POA: Diagnosis not present

## 2022-07-20 DIAGNOSIS — I5022 Chronic systolic (congestive) heart failure: Secondary | ICD-10-CM | POA: Diagnosis not present

## 2022-07-20 DIAGNOSIS — I77810 Thoracic aortic ectasia: Secondary | ICD-10-CM | POA: Diagnosis not present

## 2022-08-09 DIAGNOSIS — Z7901 Long term (current) use of anticoagulants: Secondary | ICD-10-CM | POA: Diagnosis not present

## 2022-09-04 ENCOUNTER — Ambulatory Visit (INDEPENDENT_AMBULATORY_CARE_PROVIDER_SITE_OTHER): Payer: Medicare Other

## 2022-09-04 DIAGNOSIS — I495 Sick sinus syndrome: Secondary | ICD-10-CM

## 2022-09-06 LAB — CUP PACEART REMOTE DEVICE CHECK
Battery Remaining Longevity: 73 mo
Battery Voltage: 2.98 V
Brady Statistic AP VP Percent: 89.38 %
Brady Statistic AP VS Percent: 0.58 %
Brady Statistic AS VP Percent: 7.39 %
Brady Statistic AS VS Percent: 2.66 %
Brady Statistic RA Percent Paced: 91.05 %
Brady Statistic RV Percent Paced: 96.76 %
Date Time Interrogation Session: 20240318001910
Implantable Lead Connection Status: 753985
Implantable Lead Connection Status: 753985
Implantable Lead Connection Status: 753985
Implantable Lead Implant Date: 20060223
Implantable Lead Implant Date: 20060223
Implantable Lead Implant Date: 20220309
Implantable Lead Location: 753858
Implantable Lead Location: 753859
Implantable Lead Location: 753860
Implantable Lead Model: 4598
Implantable Lead Model: 5076
Implantable Lead Model: 5076
Implantable Pulse Generator Implant Date: 20220309
Lead Channel Impedance Value: 342 Ohm
Lead Channel Impedance Value: 399 Ohm
Lead Channel Impedance Value: 418 Ohm
Lead Channel Impedance Value: 418 Ohm
Lead Channel Impedance Value: 494 Ohm
Lead Channel Impedance Value: 494 Ohm
Lead Channel Impedance Value: 513 Ohm
Lead Channel Impedance Value: 570 Ohm
Lead Channel Impedance Value: 684 Ohm
Lead Channel Impedance Value: 684 Ohm
Lead Channel Impedance Value: 722 Ohm
Lead Channel Impedance Value: 931 Ohm
Lead Channel Impedance Value: 969 Ohm
Lead Channel Impedance Value: 969 Ohm
Lead Channel Pacing Threshold Amplitude: 0.5 V
Lead Channel Pacing Threshold Amplitude: 0.625 V
Lead Channel Pacing Threshold Amplitude: 1.5 V
Lead Channel Pacing Threshold Pulse Width: 0.4 ms
Lead Channel Pacing Threshold Pulse Width: 0.4 ms
Lead Channel Pacing Threshold Pulse Width: 1.2 ms
Lead Channel Sensing Intrinsic Amplitude: 2.5 mV
Lead Channel Sensing Intrinsic Amplitude: 2.5 mV
Lead Channel Sensing Intrinsic Amplitude: 6.75 mV
Lead Channel Sensing Intrinsic Amplitude: 6.75 mV
Lead Channel Setting Pacing Amplitude: 1.5 V
Lead Channel Setting Pacing Amplitude: 2.25 V
Lead Channel Setting Pacing Amplitude: 2.5 V
Lead Channel Setting Pacing Pulse Width: 0.4 ms
Lead Channel Setting Pacing Pulse Width: 1.2 ms
Lead Channel Setting Sensing Sensitivity: 1.2 mV
Zone Setting Status: 755011

## 2022-09-18 DIAGNOSIS — Z7901 Long term (current) use of anticoagulants: Secondary | ICD-10-CM | POA: Diagnosis not present

## 2022-10-17 NOTE — Progress Notes (Signed)
Remote pacemaker transmission.   

## 2022-10-18 DIAGNOSIS — Z7901 Long term (current) use of anticoagulants: Secondary | ICD-10-CM | POA: Diagnosis not present

## 2022-11-01 DIAGNOSIS — H2513 Age-related nuclear cataract, bilateral: Secondary | ICD-10-CM | POA: Diagnosis not present

## 2022-11-20 DIAGNOSIS — Z7901 Long term (current) use of anticoagulants: Secondary | ICD-10-CM | POA: Diagnosis not present

## 2022-12-04 ENCOUNTER — Ambulatory Visit (INDEPENDENT_AMBULATORY_CARE_PROVIDER_SITE_OTHER): Payer: Medicare Other

## 2022-12-04 DIAGNOSIS — I495 Sick sinus syndrome: Secondary | ICD-10-CM | POA: Diagnosis not present

## 2022-12-05 LAB — CUP PACEART REMOTE DEVICE CHECK
Battery Remaining Longevity: 71 mo
Battery Voltage: 2.97 V
Brady Statistic AP VP Percent: 88.47 %
Brady Statistic AP VS Percent: 0.6 %
Brady Statistic AS VP Percent: 7.08 %
Brady Statistic AS VS Percent: 3.85 %
Brady Statistic RA Percent Paced: 91.51 %
Brady Statistic RV Percent Paced: 95.55 %
Date Time Interrogation Session: 20240617031118
Implantable Lead Connection Status: 753985
Implantable Lead Connection Status: 753985
Implantable Lead Connection Status: 753985
Implantable Lead Implant Date: 20060223
Implantable Lead Implant Date: 20060223
Implantable Lead Implant Date: 20220309
Implantable Lead Location: 753858
Implantable Lead Location: 753859
Implantable Lead Location: 753860
Implantable Lead Model: 4598
Implantable Lead Model: 5076
Implantable Lead Model: 5076
Implantable Pulse Generator Implant Date: 20220309
Lead Channel Impedance Value: 1007 Ohm
Lead Channel Impedance Value: 1045 Ohm
Lead Channel Impedance Value: 342 Ohm
Lead Channel Impedance Value: 399 Ohm
Lead Channel Impedance Value: 418 Ohm
Lead Channel Impedance Value: 418 Ohm
Lead Channel Impedance Value: 475 Ohm
Lead Channel Impedance Value: 475 Ohm
Lead Channel Impedance Value: 494 Ohm
Lead Channel Impedance Value: 532 Ohm
Lead Channel Impedance Value: 646 Ohm
Lead Channel Impedance Value: 684 Ohm
Lead Channel Impedance Value: 722 Ohm
Lead Channel Impedance Value: 988 Ohm
Lead Channel Pacing Threshold Amplitude: 0.625 V
Lead Channel Pacing Threshold Amplitude: 0.625 V
Lead Channel Pacing Threshold Amplitude: 1.375 V
Lead Channel Pacing Threshold Pulse Width: 0.4 ms
Lead Channel Pacing Threshold Pulse Width: 0.4 ms
Lead Channel Pacing Threshold Pulse Width: 1.2 ms
Lead Channel Sensing Intrinsic Amplitude: 2.25 mV
Lead Channel Sensing Intrinsic Amplitude: 2.25 mV
Lead Channel Sensing Intrinsic Amplitude: 7 mV
Lead Channel Sensing Intrinsic Amplitude: 7 mV
Lead Channel Setting Pacing Amplitude: 1.5 V
Lead Channel Setting Pacing Amplitude: 2.25 V
Lead Channel Setting Pacing Amplitude: 2.5 V
Lead Channel Setting Pacing Pulse Width: 0.4 ms
Lead Channel Setting Pacing Pulse Width: 1.2 ms
Lead Channel Setting Sensing Sensitivity: 1.2 mV
Zone Setting Status: 755011

## 2022-12-07 DIAGNOSIS — L988 Other specified disorders of the skin and subcutaneous tissue: Secondary | ICD-10-CM | POA: Diagnosis not present

## 2022-12-07 DIAGNOSIS — D485 Neoplasm of uncertain behavior of skin: Secondary | ICD-10-CM | POA: Diagnosis not present

## 2022-12-07 DIAGNOSIS — Z85828 Personal history of other malignant neoplasm of skin: Secondary | ICD-10-CM | POA: Diagnosis not present

## 2022-12-07 DIAGNOSIS — L821 Other seborrheic keratosis: Secondary | ICD-10-CM | POA: Diagnosis not present

## 2022-12-21 NOTE — Progress Notes (Signed)
Cardiology Office Note   Date:  12/21/2022   ID:  Steven Macdonald, DOB 1934-11-09, MRN [id number]  PCP:  Rodrigo Ran, MD  Cardiologist:   Devinne Epstein [place], MD   No chief complaint on file.     History of Present Illness: Steven Macdonald is a 88 y.o. male who is seen for follow up CAD.  He has a history of atrial flutter s/p ablation, cardiomyopathy. chronic diastolic CHF, DM, hyperlipidemia, HTN, LBBB, orthostatic hypotension, sinus node dysfunction s/p pacemaker placement, CAD and PE on lifelong coumadin therapy. He is followed in our office by Dr. Graciela Husbands. He is known to have CAD with coronary CTA in June 2019 showing occlusion of the proximal LAD. Minimal disease in the RCA and Circumflex. Echo August 2022 with LVEF=30-35% with global hypokinesis, mild AI. He has been on a beta blocker and Entresto. He is on coumadin therapy given history of PE and atrial flutter. He was seen in Feb 2023 by Dr Clifton James as a work in for evaluation of chest pain.  Bisoprolol was stopped and Imdur 15 mg daily was started.  He was scheduled for cardiac cath with bridging Lovenox. Cardiac cath was performed by Dr Herbie Baltimore on 08/02/21. This showed a CTO of the proximal LAD with collaterals. Ranexa was added for therapy and Plavix.   He noted the medication has helped but he still has significant chest pain with exertion. Able to walk only a mile whereas before he was able to walk 3 miles. Some DOE. No palpitations. No bleeding.   He presented on 09/07/2021 for planned CTO PCI of LAD.  PCI of the LAD was successful and DES was placed using IVUS guidance. He tolerated the procedure well but was noted to be hypotensive following procedure requiring fluids. Hypotension persisted throughout the night. Bedside Echo showed no evidence of pericardial effusion. He was noted to have almost a 4 g/dL drop in her hemoglobin from 14.4 to 10.5. He was asymptomatic with this though. Abdominal/pelvic CT was ordered and revealed a mild  left-sided retroperitoneal hemorrhage, and mild hemorrhage within the left lower quadrant abdominal wall soft tissue. Formal Echo on 3/23 showed normal LV function with no mention of pericardial effusion. Home Coumadin was held. Hemoglobin stable at 10.3 on day of discharge. Initial plan was for triple therapy with Aspirin, Plavix, and Coumadin for 1 month at which time Aspirin can be stopped. However, given retroperitoneal bleed, will hold Coumadin for 1 week. Given PCI to LAD, will have to continue Aspirin and Plavix.  Imdur and Ranexa were discontinued. Hgb improved to 11.0 on outpatient follow up. Entresto also discontinued due to hypotension.  He continues to do well. No angina. Still walking 4 miles a day. No bleeding on coumadin. No dizziness or palpitations. Energy level is good.     Past Medical History:  Diagnosis Date   Arthritis    Atrial flutter (HCC)    RFCA    BPH (benign prostatic hypertrophy)    Cardiomyopathy, nonischemic -resolved    Echo 2008: EF 25% Cardiac CT 2009: Normal cors EF 31% Echo 3/10 EF 45% Echo 6/13: EF 50-55%     CHF (congestive heart failure) (HCC)    Chicken pox    Colon polyps    hyperplastic   Diabetes mellitus (HCC)    ED (erectile dysfunction)    Gilbert's syndrome    possible   Hemorrhoids    Herniated cervical disc    HLD (hyperlipidemia)    HTN (hypertension)  LBBB (left bundle branch block)    Orthostatic hypotension    Pacemaker  Medtronic    DOI 2/06   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HCC)    Pulmonary embolism (HCC)    Sinus node dysfunction [facility])         Past Surgical History:  Procedure Laterality Date   BIV UPGRADE N/A 08/25/2020   Procedure: BIV PPM UPGRADE;  Surgeon: Duke Salvia, MD;  Location: [facility] INVASIVE CV LAB;  Service: Cardiovascular;  Laterality: N/A;   CARDIOVERSION     COLONOSCOPY WITH PROPOFOL N/A 01/20/2021   Procedure: COLONOSCOPY WITH PROPOFOL;  Surgeon: Hilarie Fredrickson, MD;  Location: [facility];  Service: Endoscopy;   Laterality: N/A;   coronary ablation     CORONARY CTO INTERVENTION N/A 09/07/2021   Procedure: CORONARY CTO INTERVENTION;  Surgeon: [place], Caprisha Bridgett M, MD;  Location: [facility] INVASIVE CV LAB;  Service: Cardiovascular;  Laterality: N/A;   CORONARY ULTRASOUND/IVUS N/A 09/07/2021   Procedure: Intravascular Ultrasound/IVUS;  Surgeon: [place], Avalina Benko M, MD;  Location: [facility] INVASIVE CV LAB;  Service: Cardiovascular;  Laterality: N/A;   CYSTOSCOPY W/ TRANSURETHRAL RESECTION OF POSTERIOR URETHERAL VALVES     LEFT HEART CATH AND CORONARY ANGIOGRAPHY N/A 08/02/2021   Procedure: LEFT HEART CATH AND CORONARY ANGIOGRAPHY;  Surgeon: Marykay Lex, MD;  Location: [facility] INVASIVE CV LAB;  Service: Cardiovascular;  Laterality: N/A;   PACEMAKER GENERATOR CHANGE N/A 05/13/2014   Procedure: PACEMAKER GENERATOR CHANGE;  Surgeon: Duke Salvia, MD;  Location: [facility] CATH LAB;  Service: Cardiovascular;  Laterality: N/A;   PACEMAKER INSERTION     POLYPECTOMY  01/20/2021   Procedure: POLYPECTOMY;  Surgeon: Hilarie Fredrickson, MD;  Location: [facility]OSCOPY;  Service: Endoscopy;;     Current Outpatient Medications  Medication Sig Dispense Refill   Calcium Carb-Cholecalciferol (CALCIUM 600+D3 PO) Take 5,000 mg by mouth daily.     cholecalciferol (VITAMIN D) 1000 UNITS tablet Take 1,000 Units by mouth daily.     Cyanocobalamin (VITAMIN B-12) 1000 MCG SUBL Place 1,000 mcg under the tongue daily.     docusate sodium (COLACE) 100 MG capsule Take 100 mg by mouth 2 (two) times daily.     finasteride (PROSCAR) 5 MG tablet Take 5 mg by mouth every evening.     metoprolol succinate (TOPROL-XL) 25 MG 24 hr tablet Take by mouth daily.     mirtazapine (REMERON) 15 MG tablet Take 15 mg by mouth at bedtime.     Omega-3 Fatty Acids (FISH OIL) 1200 MG CAPS Take 2,400 mg by mouth daily.     polyethylene glycol (MIRALAX / GLYCOLAX) 17 g packet Take 17 g by mouth daily.     Probiotic Product (ALIGN PO) Take 4 mg by mouth daily.     PSYLLIUM PO Take 10 mLs by  mouth daily. Citrucel powder     rosuvastatin (CRESTOR) 20 MG tablet Take 20 mg by mouth daily.     sildenafil (REVATIO) 20 MG tablet Take 100 mg by mouth daily as needed (erectile dysfunction).     tamsulosin (FLOMAX) 0.4 MG CAPS capsule Take 0.4 mg by mouth every morning. Monday, Tuesday, Friday     warfarin (COUMADIN) 5 MG tablet Take 5 mg tablet by mouth on Monday, and Thursday     warfarin (COUMADIN) 7.5 MG tablet Take 7.5 mg by mouth every other day.     No current facility-administered medications for this visit.    Allergies:   Ezetimibe    Social History:  The patient  reports  that he quit smoking about 52 years ago. His smoking use included cigarettes. He has never used smokeless tobacco. He reports current alcohol use. He reports that he does not use drugs.   Family History:  The patient's family history includes Bladder Cancer in his father; Breast cancer in his mother; Colon polyps in his father.    ROS:  Please see the history of present illness.   Otherwise, review of systems are positive for none.   All other systems are reviewed and negative.    PHYSICAL EXAM: VS:  There were no vitals taken for this visit. , BMI There is no height or weight on file to calculate BMI. GEN: Well nourished, well developed, in no acute distress HEENT: normal Neck: no JVD, carotid bruits, or masses Cardiac: RRR; no murmurs, rubs, or gallops,no edema  Respiratory:  clear to auscultation bilaterally, normal work of breathing GI: soft, nontender, nondistended, + BS MS: no deformity or atrophy Skin: warm and dry, no rash Neuro:  Strength and sensation are intact Psych: euthymic mood, full affect   EKG Interpretation Date/Time:  Monday December 25 2022 11:11:51 EDT Ventricular Rate:  62 PR Interval:  196 QRS Duration:  160 QT Interval:  452 QTC Calculation: 458 R Axis:   218  Text Interpretation: AV dual-paced rhythm When compared with ECG of 24-Oct-2021 12:06, No significant change was  found Confirmed by [place], Darrow Barreiro [phone]) on 12/25/2022 11:20:25 AM     Recent Labs: No results found for requested labs within last 365 days.    Lipid Panel    Component Value Date/Time   CHOL 130 08/31/2021 1120   TRIG 81 08/31/2021 1120   HDL 54 08/31/2021 1120   CHOLHDL 2.4 08/31/2021 1120   LDLCALC 60 08/31/2021 1120     Dated 10/22/20: cholesterol 135, triglycerides 63, HDL 53, LDL 69.  Dated 05/04/21: A1c 5.5%. LFTs normal. Dated 12/05/21: cholesterol 119, triglycerides 63, HDL 39, LDL 67. CMET and CBC normal. A1c 5.6%. normal TSH.  Wt Readings from Last 3 Encounters:  07/14/22 177 lb 3.2 oz (80.4 kg)  01/02/22 175 lb (79.4 kg)  11/22/21 177 lb 6.4 oz (80.5 kg)      Other studies Reviewed: Additional studies/ records that were reviewed today include:   Coronary CTA 10/24/17: ADDENDUM REPORT: 10/24/2017 17:01   CLINICAL DATA:  Chest pain   EXAM: Cardiac CTA   MEDICATIONS: Sub lingual nitro.  4mg  x 2 and lopressor 5mg  IV   TECHNIQUE: The patient was scanned on a Siemens 192 slice scanner. Gantry rotation speed was 250 msecs. Collimation was 0.6 mm. A 100 kV prospective scan was triggered in the ascending thoracic aorta at 35-75% of the R-R interval. Average HR during the scan was 60 bpm. The 3D data set was interpreted on a dedicated work station using MPR, MIP and VRT modes. A total of 80cc of contrast was used.   FINDINGS: Non-cardiac: See separate report from [facility].   Pacemaker lead in right heart.   Calcium Score: 20 Agatston units.   Coronary Arteries: Right dominant with no anomalies   LM: No plaque or stenosis.   LAD system: The proximal LAD appears to be subtotally occluded.   Circumflex system: Small-moderate ramus, no significant disease. No plaque or stenosis in LCx system.   RCA system: Mixed plaque proximal RCA, no significant stenosis.   IMPRESSION: 1. Coronary artery calcium score 20 Agatston units. This places  the patient in the 8th percentile for age/gender, suggesting low risk  for future cardiac events.   2. The proximal LAD appears subtotally occluded. Will confirm by FFR.   Dalton Mclean     Electronically Signed   By: Marca Ancona M.D.   On: 10/24/2017 17:01  CLINICAL DATA:  Chest pain   EXAM: CT FFR   MEDICATIONS: No additional medications.   TECHNIQUE: The coronary CTA was sent for CT FFR.   FINDINGS: FFR 0.89 distal RCA FFR 0.86 distal LCx   The proximal LAD appears occluded. There is collateral filling distally.   IMPRESSION: Occluded proximal LAD.   Dalton Mclean     Electronically Signed   By: Marca Ancona M.D.   On: 10/25/2017 14:32    Echo 02/09/21: IMPRESSIONS     1. Moderate hypertrophy of the basal septum with otherwise mild  concentric LVH. Global hypokinesis worse in the septum. Compared with the  echo 06/2020, systolic function has improved. Left ventricular ejection  fraction, by estimation, is 30 to 35%. The  left ventricle has moderately decreased function. The left ventricle  demonstrates global hypokinesis. There is moderate left ventricular  hypertrophy. Left ventricular diastolic parameters are consistent with  Grade I diastolic dysfunction (impaired  relaxation).   2. Right ventricular systolic function is normal. The right ventricular  size is normal.   3. The mitral valve is normal in structure. Trivial mitral valve  regurgitation. No evidence of mitral stenosis.   4. The aortic valve is normal in structure. Aortic valve regurgitation is  mild. No aortic stenosis is present.   5. Aortic dilatation noted. There is mild dilatation of the aortic root,  measuring 39 mm. There is mild dilatation of the ascending aorta,  measuring 42 mm.   6. The inferior vena cava is normal in size with greater than 50%  respiratory variability, suggesting right atrial pressure of 3 mmHg.   LEFT HEART CATH AND CORONARY ANGIOGRAPHY    Conclusion      Mid LAD lesion is 100% stenosed. ->  Fills via multiple septal perforator and distal PDA collaterals all the way to the occlusion site.  Favorable for CTO PCI.   LV end diastolic pressure is low.   Very tortuous right brachial artery, required versa core wire catheter steering to manipulate   Summary Severe single-vessel CAD with CTO of the proximal to mid LAD with collateral flow filling the majority of the LAD from the RPDA septal perforators. Otherwise angiographically normal coronary arteries with a codominant system.     Recommendations: We will add Plavix after loading 300 mg prior to discharge Also start Ranexa 500 g twice daily He will be discharged home today and plan for staged PCI (CTO PCI) with Dr. [place] on March 22     Bryan Lemma, MD   Diagnostic Dominance: Co-dominant Interve Conclusion      Mid LAD lesion is 100% stenosed.   A drug-eluting stent was successfully placed using a SYNERGY [id number] [id number].   Post intervention, there is a 0% residual stenosis.   Successful CTO PCI of the LAD with IVUS guidance and DES x 1     Plan: observe overnight. Plan to resume coumadin tonight without bridging. ASA 81 mg x 1 month. Plavix 75 mg daily for at least 6 months.    Intervention  Intervention    Echo 09/08/21: IMPRESSIONS     1. Moderate hypertrophy of the basal septum with otherwise mild LVH.  Compared with the echo 04/2019, systolic function has improved. Left  ventricular ejection fraction, by  estimation, is 55 to 60%. The left  ventricle has normal function. There is  moderate left ventricular hypertrophy of the basal-septal segment. Left  ventricular diastolic parameters are consistent with Grade I diastolic  dysfunction (impaired relaxation).   2. Right ventricular systolic function is normal. The right ventricular  size is normal.   3. The mitral valve is normal in structure. Trivial mitral valve  regurgitation. No evidence of  mitral stenosis.   4. The aortic valve is tricuspid. There is mild calcification of the  aortic valve. There is mild thickening of the aortic valve. Aortic valve  regurgitation is trivial.   5. Aortic dilatation noted. There is mild dilatation of the aortic root,  measuring 38 mm. There is mild dilatation of the ascending aorta,  measuring 38 mm.   ASSESSMENT AND PLAN:  1.  CAD Now s/p successful CTO PCI with DES in March 2023. LV function with marked improvement post PCI with normal EF.  Anginal symptoms resolved. Continue Coumadin. Plavix discontinued.   2. Chronic systolic CHF with ischemic CM. EF substantially improved following PCI and is now normal. Now only on low dose Toprol.  3. History of PE on chronic coumadin.  4. Sinus node dysfunction s/p PPM/BIV ICD. Stable device function. 5. Hyperlipidemia. On statin. Goal LDL < 70. Last LDL 67. Due for follow up labs with Dr Waynard Edwards next month. 6. HCM. Patient does have chronic basal septal hypertrophy. No outflow gradient. On mild end of spectrum.     Disposition:   FU with me 6 months    Signed, Aydenn Gervin [place], MD  12/21/2022 9:48 AM    [facility] [address] [place], [place], [address] Phone [phone], Fax [phone]

## 2022-12-25 ENCOUNTER — Ambulatory Visit: Payer: Medicare Other | Attending: Cardiology | Admitting: Cardiology

## 2022-12-25 ENCOUNTER — Encounter: Payer: Self-pay | Admitting: Cardiology

## 2022-12-25 VITALS — BP 114/80 | HR 62 | Ht 75.0 in | Wt 176.2 lb

## 2022-12-25 DIAGNOSIS — Z950 Presence of cardiac pacemaker: Secondary | ICD-10-CM

## 2022-12-25 DIAGNOSIS — I5022 Chronic systolic (congestive) heart failure: Secondary | ICD-10-CM | POA: Diagnosis not present

## 2022-12-25 DIAGNOSIS — I25110 Atherosclerotic heart disease of native coronary artery with unstable angina pectoris: Secondary | ICD-10-CM

## 2022-12-25 DIAGNOSIS — I447 Left bundle-branch block, unspecified: Secondary | ICD-10-CM | POA: Diagnosis not present

## 2022-12-25 DIAGNOSIS — I422 Other hypertrophic cardiomyopathy: Secondary | ICD-10-CM

## 2022-12-25 NOTE — Progress Notes (Signed)
Remote pacemaker transmission.   

## 2022-12-25 NOTE — Patient Instructions (Signed)
Medication Instructions:  Continue same medications *If you need a refill on your cardiac medications before your next appointment, please call your pharmacy*   Lab Work: None ordered   Testing/Procedures: None ordered   Follow-Up: At [facility], you and your health needs are our priority.  As part of our continuing mission to provide you with exceptional heart care, we have created designated Provider Care Teams.  These Care Teams include your primary Cardiologist (physician) and Advanced Practice Providers (APPs -  Physician Assistants and Nurse Practitioners) who all work together to provide you with the care you need, when you need it.  We recommend signing up for the patient portal called "MyChart".  Sign up information is provided on this After Visit Summary.  MyChart is used to connect with patients for Virtual Visits (Telemedicine).  Patients are able to view lab/test results, encounter notes, upcoming appointments, etc.  Non-urgent messages can be sent to your provider as well.   To learn more about what you can do with MyChart, go to [email or web address].    Your next appointment:  6 months    Call in Sept to schedule Jan appointment     Provider:  Dr.Jordan   

## 2022-12-28 DIAGNOSIS — I480 Paroxysmal atrial fibrillation: Secondary | ICD-10-CM | POA: Diagnosis not present

## 2022-12-28 DIAGNOSIS — Z7901 Long term (current) use of anticoagulants: Secondary | ICD-10-CM | POA: Diagnosis not present

## 2022-12-29 DIAGNOSIS — I2699 Other pulmonary embolism without acute cor pulmonale: Secondary | ICD-10-CM | POA: Diagnosis not present

## 2022-12-29 DIAGNOSIS — M7918 Myalgia, other site: Secondary | ICD-10-CM | POA: Diagnosis not present

## 2022-12-29 DIAGNOSIS — I5022 Chronic systolic (congestive) heart failure: Secondary | ICD-10-CM | POA: Diagnosis not present

## 2022-12-29 DIAGNOSIS — N182 Chronic kidney disease, stage 2 (mild): Secondary | ICD-10-CM | POA: Diagnosis not present

## 2022-12-29 DIAGNOSIS — Z7901 Long term (current) use of anticoagulants: Secondary | ICD-10-CM | POA: Diagnosis not present

## 2022-12-29 DIAGNOSIS — I130 Hypertensive heart and chronic kidney disease with heart failure and stage 1 through stage 4 chronic kidney disease, or unspecified chronic kidney disease: Secondary | ICD-10-CM | POA: Diagnosis not present

## 2022-12-29 DIAGNOSIS — I480 Paroxysmal atrial fibrillation: Secondary | ICD-10-CM | POA: Diagnosis not present

## 2023-01-15 DIAGNOSIS — N401 Enlarged prostate with lower urinary tract symptoms: Secondary | ICD-10-CM | POA: Diagnosis not present

## 2023-01-16 DIAGNOSIS — I5032 Chronic diastolic (congestive) heart failure: Secondary | ICD-10-CM | POA: Insufficient documentation

## 2023-01-19 ENCOUNTER — Encounter: Payer: Self-pay | Admitting: Internal Medicine

## 2023-01-19 ENCOUNTER — Ambulatory Visit: Payer: Medicare Other | Attending: Internal Medicine | Admitting: Internal Medicine

## 2023-01-19 VITALS — BP 122/70 | HR 65 | Ht 75.0 in | Wt 175.0 lb

## 2023-01-19 DIAGNOSIS — I5032 Chronic diastolic (congestive) heart failure: Secondary | ICD-10-CM

## 2023-01-19 DIAGNOSIS — I447 Left bundle-branch block, unspecified: Secondary | ICD-10-CM | POA: Diagnosis not present

## 2023-01-19 DIAGNOSIS — Z950 Presence of cardiac pacemaker: Secondary | ICD-10-CM | POA: Diagnosis not present

## 2023-01-19 DIAGNOSIS — I495 Sick sinus syndrome: Secondary | ICD-10-CM | POA: Diagnosis not present

## 2023-01-19 DIAGNOSIS — I428 Other cardiomyopathies: Secondary | ICD-10-CM

## 2023-01-19 NOTE — Patient Instructions (Signed)

## 2023-01-19 NOTE — Progress Notes (Signed)
Patient Care Team: Rodrigo Ran, MD as PCP - General Duke Salvia, MD as PCP - Electrophysiology (Cardiology) [place], Peter M, MD as PCP - Cardiology (Cardiology)   HPI  Steven Macdonald is a 88 y.o. male Seen in followup for AFlutter s/p RFCA He has had no recurrent arrhythmia.  He had a previously implanted dual-chamber Medtronic pacemaker 2006  for sinus node dysfunction; Generator replacement 11/15 and CRT upgrade 3/21 because of progressive exercise intolerance with only modest post implantation improvement;  6/22 electrocardiographic guided reprogramming, lengthening AV delay and adding LV offset of +40 to shorten QRS duration.  Discontinued the propafenone with some trepidation given his hypotension and modest LV dysfunction  History of syncope (11/20) triggered by atrial arrhythmias and he was treated with propafenone for rhythm control; this was discontinued because of persistent LV dysfunction, albeit with some trepidation.  Sherryll Burger was added  Cardiac CT demonstrated LAD occlusion (per the notes from Dr. Ander Slade)      Developed more chest pain and underwent cath and PCI of CTO-LAD     The patient denies chest pain, shortness of breath, nocturnal dyspnea, orthopnea or peripheral edema.  There have been no palpitations, lightheadedness or syncope.     He has a history of pulmonary embolism and is on Coumadin.     Date Cr K Hgb  10/18   14.5   11/20 0.99 4.0 14.2  3/22 1.13 4.6 15.1  3/23 1.16 4.0 11     DATE TEST EF    10/09 CTA   Ca score 0  12/13 Echo   25 %    1/15 Myoview   Multiple perfusion defects   1/15 Echo   45-50 %    10/16 Echo  45-50%    11/20 Echo  40-45%    6/21 CTA   4.1cm Aortic root  01/22 Echo 20-25%    8/22 Echo  40-45%   2/23 LHC  CTO LAD>>PCI   3/23 Echo  55-60%          Past Medical History:  Diagnosis Date   Arthritis    Atrial flutter (HCC)    RFCA    BPH (benign prostatic hypertrophy)    Cardiomyopathy, nonischemic  -resolved    Echo 2008: EF 25% Cardiac CT 2009: Normal cors EF 31% Echo 3/10 EF 45% Echo 6/13: EF 50-55%     CHF (congestive heart failure) (HCC)    Chicken pox    Colon polyps    hyperplastic   Diabetes mellitus (HCC)    ED (erectile dysfunction)    Gilbert's syndrome    possible   Hemorrhoids    Herniated cervical disc    HLD (hyperlipidemia)    HTN (hypertension)    LBBB (left bundle branch block)    Orthostatic hypotension    Pacemaker  Medtronic    DOI 2/06   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HCC)    Pulmonary embolism (HCC)    Sinus node dysfunction (HCC)         Past Surgical History:  Procedure Laterality Date   BIV UPGRADE N/A 08/25/2020   Procedure: BIV PPM UPGRADE;  Surgeon: Duke Salvia, MD;  Location: [facility] INVASIVE CV LAB;  Service: Cardiovascular;  Laterality: N/A;   CARDIOVERSION     COLONOSCOPY WITH PROPOFOL N/A 01/20/2021   Procedure: COLONOSCOPY WITH PROPOFOL;  Surgeon: Hilarie Fredrickson, MD;  Location: [facility] ENDOSCOPY;  Service: Endoscopy;  Laterality: N/A;   coronary ablation  CORONARY CTO INTERVENTION N/A 09/07/2021   Procedure: CORONARY CTO INTERVENTION;  Surgeon: [place], Peter M, MD;  Location: [facility] INVASIVE CV LAB;  Service: Cardiovascular;  Laterality: N/A;   CORONARY ULTRASOUND/IVUS N/A 09/07/2021   Procedure: Intravascular Ultrasound/IVUS;  Surgeon: [place], Peter M, MD;  Location: [facility] INVASIVE CV LAB;  Service: Cardiovascular;  Laterality: N/A;   CYSTOSCOPY W/ TRANSURETHRAL RESECTION OF POSTERIOR URETHERAL VALVES     LEFT HEART CATH AND CORONARY ANGIOGRAPHY N/A 08/02/2021   Procedure: LEFT HEART CATH AND CORONARY ANGIOGRAPHY;  Surgeon: Marykay Lex, MD;  Location: [facility] INVASIVE CV LAB;  Service: Cardiovascular;  Laterality: N/A;   PACEMAKER GENERATOR CHANGE N/A 05/13/2014   Procedure: PACEMAKER GENERATOR CHANGE;  Surgeon: Duke Salvia, MD;  Location: [facility] CATH LAB;  Service: Cardiovascular;  Laterality: N/A;   PACEMAKER INSERTION     POLYPECTOMY  01/20/2021   Procedure:  POLYPECTOMY;  Surgeon: Hilarie Fredrickson, MD;  Location: [facility] ENDOSCOPY;  Service: Endoscopy;;    Current Outpatient Medications  Medication Sig Dispense Refill   Calcium Carb-Cholecalciferol (CALCIUM 600+D3 PO) Take 5,000 mg by mouth daily.     cholecalciferol (VITAMIN D) 1000 UNITS tablet Take 1,000 Units by mouth daily.     Cyanocobalamin (VITAMIN B-12) 1000 MCG SUBL Place 1,000 mcg under the tongue daily.     docusate sodium (COLACE) 100 MG capsule Take 100 mg by mouth 2 (two) times daily.     finasteride (PROSCAR) 5 MG tablet Take 5 mg by mouth every evening.     metoprolol succinate (TOPROL-XL) 25 MG 24 hr tablet Take by mouth daily.     mirtazapine (REMERON) 15 MG tablet Take 15 mg by mouth at bedtime.     Omega-3 Fatty Acids (FISH OIL) 1200 MG CAPS Take 2,400 mg by mouth daily.     polyethylene glycol (MIRALAX / GLYCOLAX) 17 g packet Take 17 g by mouth daily.     Probiotic Product (ALIGN PO) Take 4 mg by mouth daily.     PSYLLIUM PO Take 10 mLs by mouth daily. Citrucel powder     rosuvastatin (CRESTOR) 20 MG tablet Take 20 mg by mouth daily.     sildenafil (REVATIO) 20 MG tablet Take 100 mg by mouth daily as needed (erectile dysfunction).     tamsulosin (FLOMAX) 0.4 MG CAPS capsule Take 0.4 mg by mouth every morning. Monday, Tuesday, Friday     warfarin (COUMADIN) 5 MG tablet Take 5 mg tablet by mouth on Monday, and Thursday     warfarin (COUMADIN) 7.5 MG tablet Take 7.5 mg by mouth every other day.     No current facility-administered medications for this visit.    Allergies  Allergen Reactions   Ezetimibe Other (See Comments)    Unknown    Review of Systems negative except from HPI and PMH  Physical Exam BP 122/70   Pulse 65   Ht 6\' 3"  (1.905 m)   Wt 175 lb (79.4 kg)   SpO2 96%   BMI 21.87 kg/m  Well developed and well nourished in no acute distress HENT normal Neck supple with JVP-flat Clear Device pocket well healed; without hematoma or erythema.  There is no  tethering  Regular rate and rhythm, no murmur Abd-soft with active BS No Clubbing cyanosis  edema Skin-warm and dry A & Oriented  Grossly normal sensory and motor function  ECG AV pacing   with upright QRS lead V1 and negative QRS lead I  Device function is normal. Programming changes LV  output was increased marginally to try to maintain safety margin See Paceart for details    Assessment and  Plan  Sinus node dysfunction  Pacemaker-Medtronic . CRT    Nonischemic pacemaker cardiomyopathy-resolved  CHF chronic diastolic   LBBB   Pulmonary Embolism  Chronic therapy  Depression  Syncope  2/2 atrial tachycardia >> propafenone stopped  CTO s/p PCI   With cardiomyopathy continue GDMT with metoprolol further drugs have been limited by low blood pressure and orthostasis.  Dyspnea is much improved.  No symptoms of ischemia.  Continue warfarin and statins I have discussed again with him and reached out to Dr. Sherrye Payor about considering NOAC therapy as an alternative to warfarin both for the decreased ICH risk, the potentially improve mortality in the atrial fibrillation cohort as well as the adjunctive benefit of a NOAC as an alternative to antiplatelet therapy and a prior stent the patient versus warfarin.  No bleeding on his anticoagulant.  Will continue the warfarin.  Recurrent asymptomatic atrial tachycardia.  Propafenone has been discontinued in the context of his coronary disease.  If this were to recur we will try dronaderone

## 2023-01-22 DIAGNOSIS — Z7901 Long term (current) use of anticoagulants: Secondary | ICD-10-CM | POA: Diagnosis not present

## 2023-01-22 DIAGNOSIS — I480 Paroxysmal atrial fibrillation: Secondary | ICD-10-CM | POA: Diagnosis not present

## 2023-02-01 DIAGNOSIS — N182 Chronic kidney disease, stage 2 (mild): Secondary | ICD-10-CM | POA: Diagnosis not present

## 2023-02-01 DIAGNOSIS — I5022 Chronic systolic (congestive) heart failure: Secondary | ICD-10-CM | POA: Diagnosis not present

## 2023-02-01 DIAGNOSIS — E785 Hyperlipidemia, unspecified: Secondary | ICD-10-CM | POA: Diagnosis not present

## 2023-02-01 DIAGNOSIS — E291 Testicular hypofunction: Secondary | ICD-10-CM | POA: Diagnosis not present

## 2023-02-01 DIAGNOSIS — I130 Hypertensive heart and chronic kidney disease with heart failure and stage 1 through stage 4 chronic kidney disease, or unspecified chronic kidney disease: Secondary | ICD-10-CM | POA: Diagnosis not present

## 2023-02-01 DIAGNOSIS — I2510 Atherosclerotic heart disease of native coronary artery without angina pectoris: Secondary | ICD-10-CM | POA: Diagnosis not present

## 2023-02-01 DIAGNOSIS — N401 Enlarged prostate with lower urinary tract symptoms: Secondary | ICD-10-CM | POA: Diagnosis not present

## 2023-02-05 ENCOUNTER — Telehealth: Payer: Self-pay | Admitting: Internal Medicine

## 2023-02-05 NOTE — Telephone Encounter (Signed)
Patient states he has been experiencing convulsions/spasms in his chest. Patient denies chest pain. Patient denies swelling. He states this feeling occurs 8-10 times over and then stops, but it occurs multiple times throughout the day. He states this has been going on a while but has become more frequent over the past few days.

## 2023-02-05 NOTE — Telephone Encounter (Signed)
Spoke with pt who denies current CP, SOB or dizziness.  He states he feels as if he has episodes of his heart "lurching."  Requested pt send a pacemaker transmission for review.  Pt states he will need assistance doing this.  Pt advised will have device clinic contact him to assist with transmission.  Pt verbalizes understanding and agrees with current plan.

## 2023-02-05 NOTE — Telephone Encounter (Signed)
Patient describes visible muscle twitching to the left chest. Felt similar to muscle spasms or hiccup. Sunday it was pretty significant, has only had brief episodes "spasm" episodes today.  This likely is due to diaphragmatic stim.  Patient is asymptomatic otherwise and doing well.  Appointment made with device clinic this week, 02/08/2023 at 2 pm to evaluate and adjust vectors.  At OV on 01/19/2023, LV output voltage was increased on LV lead to promote better safety margin, likely resulting in the now felt stim.    Device manual transmission received. Normal Device function. CRT pacing <90% - likely due to frequent AF/RVR events on 8/18. 3 Fast Atrial events, appear 2 SVT, and 1 (6hr,69min episode of AF w/RVR).  Histograms show RVR during AT/AF events (total 6 hours). (Known Aflutter/fib, on OAC).  Patient denies any symptoms SOB, CP, Dizziness, or fatigue.  Appt in clinic made for next available on 02/08/23.  Patient instructed to call back to office to move up if twitching becomes more severe and/or uncomfortable and cannot wait until Thursday.  Also, if severe symptoms develop to go to ER. He is to increase fluid intake and monitor his blood pressures, reports systollic today of 97.  If BP continues to run low, he is to reach out to Dr. [place] and/or go to ER if continues to decrease with concerning symptoms.

## 2023-02-08 ENCOUNTER — Ambulatory Visit: Payer: Medicare Other | Attending: Cardiovascular Disease

## 2023-02-08 ENCOUNTER — Other Ambulatory Visit (HOSPITAL_COMMUNITY): Payer: Self-pay

## 2023-02-08 DIAGNOSIS — I2510 Atherosclerotic heart disease of native coronary artery without angina pectoris: Secondary | ICD-10-CM | POA: Diagnosis not present

## 2023-02-08 DIAGNOSIS — F329 Major depressive disorder, single episode, unspecified: Secondary | ICD-10-CM | POA: Diagnosis not present

## 2023-02-08 DIAGNOSIS — E785 Hyperlipidemia, unspecified: Secondary | ICD-10-CM | POA: Diagnosis not present

## 2023-02-08 DIAGNOSIS — Z0000 Encounter for general adult medical examination without abnormal findings: Secondary | ICD-10-CM | POA: Diagnosis not present

## 2023-02-08 DIAGNOSIS — Z7901 Long term (current) use of anticoagulants: Secondary | ICD-10-CM | POA: Diagnosis not present

## 2023-02-08 DIAGNOSIS — Z950 Presence of cardiac pacemaker: Secondary | ICD-10-CM | POA: Diagnosis not present

## 2023-02-08 DIAGNOSIS — I495 Sick sinus syndrome: Secondary | ICD-10-CM

## 2023-02-08 DIAGNOSIS — R82998 Other abnormal findings in urine: Secondary | ICD-10-CM | POA: Diagnosis not present

## 2023-02-08 DIAGNOSIS — I130 Hypertensive heart and chronic kidney disease with heart failure and stage 1 through stage 4 chronic kidney disease, or unspecified chronic kidney disease: Secondary | ICD-10-CM | POA: Diagnosis not present

## 2023-02-08 DIAGNOSIS — I5022 Chronic systolic (congestive) heart failure: Secondary | ICD-10-CM | POA: Diagnosis not present

## 2023-02-08 DIAGNOSIS — D6869 Other thrombophilia: Secondary | ICD-10-CM | POA: Diagnosis not present

## 2023-02-08 DIAGNOSIS — D696 Thrombocytopenia, unspecified: Secondary | ICD-10-CM | POA: Diagnosis not present

## 2023-02-08 DIAGNOSIS — I10 Essential (primary) hypertension: Secondary | ICD-10-CM | POA: Diagnosis not present

## 2023-02-08 DIAGNOSIS — I480 Paroxysmal atrial fibrillation: Secondary | ICD-10-CM | POA: Diagnosis not present

## 2023-02-08 DIAGNOSIS — N182 Chronic kidney disease, stage 2 (mild): Secondary | ICD-10-CM | POA: Diagnosis not present

## 2023-02-08 LAB — CUP PACEART INCLINIC DEVICE CHECK
Date Time Interrogation Session: 20240822185627
Implantable Lead Connection Status: 753985
Implantable Lead Connection Status: 753985
Implantable Lead Connection Status: 753985
Implantable Lead Implant Date: 20060223
Implantable Lead Implant Date: 20060223
Implantable Lead Implant Date: 20220309
Implantable Lead Location: 753858
Implantable Lead Location: 753859
Implantable Lead Location: 753860
Implantable Lead Model: 4598
Implantable Lead Model: 5076
Implantable Lead Model: 5076
Implantable Pulse Generator Implant Date: 20220309

## 2023-02-08 MED ORDER — ELIQUIS 5 MG PO TABS
5.0000 mg | ORAL_TABLET | Freq: Two times a day (BID) | ORAL | 3 refills | Status: AC
  Filled 2023-02-08: qty 60, 30d supply, fill #0
  Filled 2023-04-13: qty 60, 30d supply, fill #1
  Filled 2023-05-14: qty 60, 30d supply, fill #2
  Filled 2023-06-16: qty 60, 30d supply, fill #3

## 2023-02-08 NOTE — Patient Instructions (Addendum)
We made some adjustments to your device that should help. If you experience any more of the twitching sensations or any other concerns please call us at [phone].  Otherwise, the nurse will call you at 10am on 02/15/23 to follow up and get you to send [place] a manual transmission.   Thank you for your visit, have a nice day !

## 2023-02-08 NOTE — Progress Notes (Signed)
Patient brought in due to reports of diaphragmatic stim since increasing LV pacing output at 8/2/2-24 OV.  [place], MD[employer] rep, present and assisting with check today.  See interrogation report scanned to [facility] for full details.  Reprogrammed LV lead to LV 2 to Can, unable to reproduce stim.  LV pacing threshold test improved, 1.15mv@0 .8ms. Re-programmed LV output from 4.5 @.4 to 3.62mv@0 .8ms.    There were multiple atrial events noted on 02/04/23. Longest recorded, 5 hours 47 mins of AFib with RVR event. Patient reports having symptoms during that day in addition to stim.  There have been no episodes since, and other than the continuing stim, he has been feeling well. He has known AF and is taking Eliquis and metoprolol daily, no missed doses.  Will continue to monitor rates and symptoms.  Patient to send manual transmission in 1 week to re-evaluate changes made today and review AF and ventricular rate control.  He has our device clinic number if any continued symptoms after visit today with either stim or elevated heart rates.

## 2023-02-15 ENCOUNTER — Telehealth: Payer: Self-pay

## 2023-02-15 NOTE — Telephone Encounter (Signed)
Follow up call made today to re-assess patient's LV vector adjustments due to diaphragmatic stim experienced last week.  Also to re-assess Atrial burden.    Patient reports no further stim episodes.  There is 1 noted FAST A/V event , appears of AT/Flutter with fast ventricular rates. (190/191 bpm)  Event occurred on 8/26 about 827 pm, patient denies any symptoms and is doing well. Histograms note elevated v rates during recent atrial events.  8/18 and 8/26.  Known Aflutter, on Eliquis, no missed doses.  Currently taking all other meds as prescribed including metoprolol, no missed doses.  Overall burden is low.  (Back to 0%)

## 2023-02-20 NOTE — Telephone Encounter (Signed)
noted 

## 2023-03-05 ENCOUNTER — Ambulatory Visit (INDEPENDENT_AMBULATORY_CARE_PROVIDER_SITE_OTHER): Payer: Medicare Other

## 2023-03-05 DIAGNOSIS — I495 Sick sinus syndrome: Secondary | ICD-10-CM

## 2023-03-06 LAB — CUP PACEART REMOTE DEVICE CHECK
Battery Remaining Longevity: 49 mo
Battery Voltage: 2.96 V
Brady Statistic AP VP Percent: 86.86 %
Brady Statistic AP VS Percent: 0.6 %
Brady Statistic AS VP Percent: 7.6 %
Brady Statistic AS VS Percent: 4.94 %
Brady Statistic RA Percent Paced: 90.83 %
Brady Statistic RV Percent Paced: 94.45 %
Date Time Interrogation Session: 20240915214214
Implantable Lead Connection Status: 753985
Implantable Lead Connection Status: 753985
Implantable Lead Connection Status: 753985
Implantable Lead Implant Date: 20060223
Implantable Lead Implant Date: 20060223
Implantable Lead Implant Date: 20220309
Implantable Lead Location: 753858
Implantable Lead Location: 753859
Implantable Lead Location: 753860
Implantable Lead Model: 4598
Implantable Lead Model: 5076
Implantable Lead Model: 5076
Implantable Pulse Generator Implant Date: 20220309
Lead Channel Impedance Value: 1007 Ohm
Lead Channel Impedance Value: 1026 Ohm
Lead Channel Impedance Value: 1064 Ohm
Lead Channel Impedance Value: 342 Ohm
Lead Channel Impedance Value: 418 Ohm
Lead Channel Impedance Value: 437 Ohm
Lead Channel Impedance Value: 456 Ohm
Lead Channel Impedance Value: 475 Ohm
Lead Channel Impedance Value: 494 Ohm
Lead Channel Impedance Value: 513 Ohm
Lead Channel Impedance Value: 513 Ohm
Lead Channel Impedance Value: 703 Ohm
Lead Channel Impedance Value: 741 Ohm
Lead Channel Impedance Value: 760 Ohm
Lead Channel Pacing Threshold Amplitude: 0.625 V
Lead Channel Pacing Threshold Amplitude: 0.625 V
Lead Channel Pacing Threshold Amplitude: 2.25 V
Lead Channel Pacing Threshold Pulse Width: 0.4 ms
Lead Channel Pacing Threshold Pulse Width: 0.4 ms
Lead Channel Pacing Threshold Pulse Width: 0.8 ms
Lead Channel Sensing Intrinsic Amplitude: 10.625 mV
Lead Channel Sensing Intrinsic Amplitude: 10.625 mV
Lead Channel Sensing Intrinsic Amplitude: 2.25 mV
Lead Channel Sensing Intrinsic Amplitude: 2.25 mV
Lead Channel Setting Pacing Amplitude: 1.5 V
Lead Channel Setting Pacing Amplitude: 2.5 V
Lead Channel Setting Pacing Amplitude: 3.25 V
Lead Channel Setting Pacing Pulse Width: 0.4 ms
Lead Channel Setting Pacing Pulse Width: 0.8 ms
Lead Channel Setting Sensing Sensitivity: 1.2 mV
Zone Setting Status: 755011

## 2023-03-13 ENCOUNTER — Inpatient Hospital Stay: Payer: Medicare Other

## 2023-03-13 ENCOUNTER — Inpatient Hospital Stay: Payer: Medicare Other | Attending: Hematology | Admitting: Hematology

## 2023-03-13 VITALS — BP 130/76 | HR 65 | Temp 98.1°F | Resp 18 | Wt 179.2 lb

## 2023-03-13 DIAGNOSIS — Z8551 Personal history of malignant neoplasm of bladder: Secondary | ICD-10-CM | POA: Diagnosis not present

## 2023-03-13 DIAGNOSIS — Z7901 Long term (current) use of anticoagulants: Secondary | ICD-10-CM | POA: Insufficient documentation

## 2023-03-13 DIAGNOSIS — Z79899 Other long term (current) drug therapy: Secondary | ICD-10-CM | POA: Diagnosis not present

## 2023-03-13 DIAGNOSIS — Z950 Presence of cardiac pacemaker: Secondary | ICD-10-CM | POA: Insufficient documentation

## 2023-03-13 DIAGNOSIS — Z87891 Personal history of nicotine dependence: Secondary | ICD-10-CM | POA: Diagnosis not present

## 2023-03-13 DIAGNOSIS — Z83719 Family history of colon polyps, unspecified: Secondary | ICD-10-CM | POA: Insufficient documentation

## 2023-03-13 DIAGNOSIS — D696 Thrombocytopenia, unspecified: Secondary | ICD-10-CM | POA: Insufficient documentation

## 2023-03-13 DIAGNOSIS — Z803 Family history of malignant neoplasm of breast: Secondary | ICD-10-CM | POA: Insufficient documentation

## 2023-03-13 LAB — CBC WITH DIFFERENTIAL (CANCER CENTER ONLY)
Abs Immature Granulocytes: 0.01 10*3/uL (ref 0.00–0.07)
Basophils Absolute: 0.1 10*3/uL (ref 0.0–0.1)
Basophils Relative: 1 %
Eosinophils Absolute: 0.3 10*3/uL (ref 0.0–0.5)
Eosinophils Relative: 5 %
HCT: 44.1 % (ref 39.0–52.0)
Hemoglobin: 14.5 g/dL (ref 13.0–17.0)
Immature Granulocytes: 0 %
Lymphocytes Relative: 40 %
Lymphs Abs: 2.6 10*3/uL (ref 0.7–4.0)
MCH: 28.2 pg (ref 26.0–34.0)
MCHC: 32.9 g/dL (ref 30.0–36.0)
MCV: 85.8 fL (ref 80.0–100.0)
Monocytes Absolute: 0.6 10*3/uL (ref 0.1–1.0)
Monocytes Relative: 9 %
Neutro Abs: 2.9 10*3/uL (ref 1.7–7.7)
Neutrophils Relative %: 45 %
Platelet Count: 108 10*3/uL — ABNORMAL LOW (ref 150–400)
RBC: 5.14 MIL/uL (ref 4.22–5.81)
RDW: 14.4 % (ref 11.5–15.5)
Smear Review: NORMAL
WBC Count: 6.4 10*3/uL (ref 4.0–10.5)
nRBC: 0 % (ref 0.0–0.2)

## 2023-03-13 LAB — WBC/PLT IN CITRATE

## 2023-03-13 LAB — CMP (CANCER CENTER ONLY)
ALT: 15 U/L (ref 0–44)
AST: 26 U/L (ref 15–41)
Albumin: 4 g/dL (ref 3.5–5.0)
Alkaline Phosphatase: 53 U/L (ref 38–126)
Anion gap: 4 — ABNORMAL LOW (ref 5–15)
BUN: 18 mg/dL (ref 8–23)
CO2: 29 mmol/L (ref 22–32)
Calcium: 10.1 mg/dL (ref 8.9–10.3)
Chloride: 107 mmol/L (ref 98–111)
Creatinine: 1.43 mg/dL — ABNORMAL HIGH (ref 0.61–1.24)
GFR, Estimated: 48 mL/min — ABNORMAL LOW (ref >60)
Glucose, Bld: 93 mg/dL (ref 70–99)
Potassium: 4.1 mmol/L (ref 3.5–5.1)
Sodium: 140 mmol/L (ref 135–145)
Total Bilirubin: 1.3 mg/dL — ABNORMAL HIGH (ref 0.3–1.2)
Total Protein: 6.5 g/dL (ref 6.5–8.1)

## 2023-03-13 LAB — LACTATE DEHYDROGENASE: LDH: 156 U/L (ref 98–192)

## 2023-03-13 LAB — VITAMIN B12: Vitamin B-12: 852 pg/mL (ref 180–914)

## 2023-03-13 LAB — HEPATITIS C ANTIBODY: HCV Ab: NONREACTIVE

## 2023-03-13 LAB — IMMATURE PLATELET FRACTION: Immature Platelet Fraction: 3.6 % (ref 1.2–8.6)

## 2023-03-13 NOTE — Progress Notes (Signed)
HEMATOLOGY/ONCOLOGY CONSULTATION NOTE  Date of Service: 03/13/2023  Patient Care Team: Rodrigo Ran, MD as PCP - General Duke Salvia, MD as PCP - Electrophysiology (Cardiology) [place], Peter M, MD as PCP - Cardiology (Cardiology)  CHIEF COMPLAINTS/PURPOSE OF CONSULTATION:  Thrombocytopenia, unspecified.  HISTORY OF PRESENTING ILLNESS:  Steven Macdonald is a wonderful 88 y.o. male who has been referred to [place] by Dr. Rodrigo Ran for evaluation and management of Thrombocytopenia, unspecified. Chronic Low platelet counts. Last three lab results: platelets of 126 K on 12/09/2021, Platelets of 119 K on 07/20/2022, and platelets of 96 K on 02/01/2023.   Patient is accompanied by his wife during this visit. In the last 60-months to 1-year, patient notes his Warfarin has been changed to Eliquis. Patient had blood clots around late 2009, but he was placed on blood thinners in 2006. Patient had pacemaker placed in 2009, bu denies atrial fibrillations.   Patient notes he has skin bruises, but denies any abnormal bleeding. He denies any hematuria, black stool, or blood in stool. He denies any recent infection issues.   In the last 6 months, he has been feeling for fatigued and tired, which does not affect his daily activities. Patient associates fatigue to his age.  He regularly takes vitamin B-12 D-3 supplement.   Patient is complaint with all of his medications.   He denies any recent infection issues, fever, chills, night sweats, abdominal pain, abnormal bowel movement, chest pain, back pain, or leg swelling. He does complain of chronic constipation.   He denies any past medical history of liver disorders.   Patient currently drinks around 2 wine glasses a day. Patient shares half of wine bottle a day. Patient's wife notes that they used to drink 1 wine bottle a day, shared between them.   MEDICAL HISTORY:  Past Medical History:  Diagnosis Date   Arthritis    Atrial flutter (HCC)     RFCA    BPH (benign prostatic hypertrophy)    Cardiomyopathy, nonischemic -resolved    Echo 2008: EF 25% Cardiac CT 2009: Normal cors EF 31% Echo 3/10 EF 45% Echo 6/13: EF 50-55%     CHF (congestive heart failure) (HCC)    Chicken pox    Colon polyps    hyperplastic   Diabetes mellitus (HCC)    ED (erectile dysfunction)    Gilbert's syndrome    possible   Hemorrhoids    Herniated cervical disc    HLD (hyperlipidemia)    HTN (hypertension)    LBBB (left bundle branch block)    Orthostatic hypotension    Pacemaker  Medtronic    DOI 2/06   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HCC)    Pulmonary embolism (HCC)    Sinus node dysfunction (HCC)         SURGICAL HISTORY: Past Surgical History:  Procedure Laterality Date   BIV UPGRADE N/A 08/25/2020   Procedure: BIV PPM UPGRADE;  Surgeon: Duke Salvia, MD;  Location: [facility] INVASIVE CV LAB;  Service: Cardiovascular;  Laterality: N/A;   CARDIOVERSION     COLONOSCOPY WITH PROPOFOL N/A 01/20/2021   Procedure: COLONOSCOPY WITH PROPOFOL;  Surgeon: Hilarie Fredrickson, MD;  Location: [facility] ENDOSCOPY;  Service: Endoscopy;  Laterality: N/A;   coronary ablation     CORONARY CTO INTERVENTION N/A 09/07/2021   Procedure: CORONARY CTO INTERVENTION;  Surgeon: [place], Peter M, MD;  Location: [facility] INVASIVE CV LAB;  Service: Cardiovascular;  Laterality: N/A;   CORONARY ULTRASOUND/IVUS N/A 09/07/2021   Procedure:  Intravascular Ultrasound/IVUS;  Surgeon: [place], Peter M, MD;  Location: [facility] INVASIVE CV LAB;  Service: Cardiovascular;  Laterality: N/A;   CYSTOSCOPY W/ TRANSURETHRAL RESECTION OF POSTERIOR URETHERAL VALVES     LEFT HEART CATH AND CORONARY ANGIOGRAPHY N/A 08/02/2021   Procedure: LEFT HEART CATH AND CORONARY ANGIOGRAPHY;  Surgeon: Marykay Lex, MD;  Location: [facility] INVASIVE CV LAB;  Service: Cardiovascular;  Laterality: N/A;   PACEMAKER GENERATOR CHANGE N/A 05/13/2014   Procedure: PACEMAKER GENERATOR CHANGE;  Surgeon: Duke Salvia, MD;  Location: [facility] CATH LAB;  Service:  Cardiovascular;  Laterality: N/A;   PACEMAKER INSERTION     POLYPECTOMY  01/20/2021   Procedure: POLYPECTOMY;  Surgeon: Hilarie Fredrickson, MD;  Location: [facility] ENDOSCOPY;  Service: Endoscopy;;    SOCIAL HISTORY: Social History   Socioeconomic History   Marital status: Married    Spouse name: Not on file   Number of children: 4   Years of education: Not on file   Highest education level: Not on file  Occupational History   Occupation: lawyer    Employer: SELF-EMPLOYED  Tobacco Use   Smoking status: Former    Current packs/day: 0.00    Types: Cigarettes    Quit date: 10/17/1970    Years since quitting: 52.4   Smokeless tobacco: Never  Vaping Use   Vaping status: Never Used  Substance and Sexual Activity   Alcohol use: Yes    Comment: Drink every day   Drug use: No   Sexual activity: Not on file  Other Topics Concern   Not on file  Social History Narrative   Not on file   Social Determinants of Health   Financial Resource Strain: Not on file  Food Insecurity: Not on file  Transportation Needs: Not on file  Physical Activity: Not on file  Stress: Not on file  Social Connections: Unknown (10/28/2021)   Received [facility]   Social Network    Social Network: Not on file  Intimate Partner Violence: Unknown (09/20/2021)   Received from [facility]   HITS    Physically Hurt: Not on file    Insult or Talk Down To: Not on file    Threaten Physical Harm: Not on file    Scream or Curse: Not on file    FAMILY HISTORY: Family History  Problem Relation Age of Onset   Breast cancer Mother    Colon polyps Father        pre-cancerous   Bladder Cancer Father     ALLERGIES:  is allergic to ezetimibe.  MEDICATIONS:  Current Outpatient Medications  Medication Sig Dispense Refill   apixaban (ELIQUIS) 5 MG TABS tablet Take 1 tablet (5 mg total) by mouth 2 (two) times daily. 180 tablet 3   cholecalciferol (VITAMIN D) 1000 UNITS tablet Take  1,000 Units by mouth daily.     Cyanocobalamin (VITAMIN B-12) 1000 MCG SUBL Place 1,000 mcg under the tongue daily.     docusate sodium (COLACE) 100 MG capsule Take 100 mg by mouth 2 (two) times daily.     finasteride (PROSCAR) 5 MG tablet Take 5 mg by mouth every evening.     metoprolol succinate (TOPROL-XL) 25 MG 24 hr tablet Take by mouth daily.     mirtazapine (REMERON) 15 MG tablet Take 15 mg by mouth at bedtime.     polyethylene glycol (MIRALAX / GLYCOLAX) 17 g packet Take 17 g by mouth daily.     Probiotic Product (ALIGN  PO) Take 4 mg by mouth daily.     PSYLLIUM PO Take 10 mLs by mouth daily. Citrucel powder     rosuvastatin (CRESTOR) 20 MG tablet Take 20 mg by mouth daily.     sildenafil (REVATIO) 20 MG tablet Take 100 mg by mouth daily as needed (erectile dysfunction).     tamsulosin (FLOMAX) 0.4 MG CAPS capsule Take 0.4 mg by mouth every morning. Monday, Tuesday, Friday     No current facility-administered medications for this visit.    REVIEW OF SYSTEMS:    10 Point review of Systems was done is negative except as noted above.  PHYSICAL EXAMINATION: ECOG PERFORMANCE STATUS: 1 - Symptomatic but completely ambulatory  . Vitals:   03/13/23 1427  BP: 130/76  Pulse: 65  Resp: 18  Temp: 98.1 F (36.7 C)  SpO2: 98%   Filed Weights   03/13/23 1427  Weight: 179 lb 3.2 oz (81.3 kg)   .Body mass index is 22.4 kg/m.  GENERAL:alert, in no acute distress and comfortable SKIN: no acute rashes, no significant lesions EYES: conjunctiva are pink and non-injected, sclera anicteric OROPHARYNX: MMM, no exudates, no oropharyngeal erythema or ulceration NECK: supple, no JVD LYMPH:  no palpable lymphadenopathy in the cervical, axillary or inguinal regions LUNGS: clear to auscultation b/l with normal respiratory effort HEART: regular rate & rhythm ABDOMEN:  normoactive bowel sounds , non tender, not distended. Extremity: no pedal edema PSYCH: alert & oriented x 3 with fluent  speech NEURO: no focal motor/sensory deficits  LABORATORY DATA:  I have reviewed the data as listed  .    Latest Ref Rng & Units 03/13/2023    3:35 PM 09/12/2021    2:24 PM 09/09/2021    3:04 AM  CBC  WBC 4.0 - 10.5 K/uL 6.4  6.9  6.4   Hemoglobin 13.0 - 17.0 g/dL 16.1  09.6  04.5   Hematocrit 39.0 - 52.0 % 44.1  33.2  31.1   Platelets 150 - 400 K/uL 108  143  96     .    Latest Ref Rng & Units 03/13/2023    3:35 PM 09/08/2021    9:08 AM 09/08/2021    3:22 AM  CMP  Glucose 70 - 99 mg/dL 93  409  811   BUN 8 - 23 mg/dL 18  16  19    Creatinine 0.61 - 1.24 mg/dL 9.14  7.82  9.56   Sodium 135 - 145 mmol/L 140  137  141   Potassium 3.5 - 5.1 mmol/L 4.1  4.0  3.9   Chloride 98 - 111 mmol/L 107  108  113   CO2 22 - 32 mmol/L 29  25  26    Calcium 8.9 - 10.3 mg/dL 21.3  8.5  8.5   Total Protein 6.5 - 8.1 g/dL 6.5     Total Bilirubin 0.3 - 1.2 mg/dL 1.3     Alkaline Phos 38 - 126 U/L 53     AST 15 - 41 U/L 26     ALT 0 - 44 U/L 15        RADIOGRAPHIC STUDIES: I have personally reviewed the radiological images as listed and agreed with the findings in the report. CUP PACEART REMOTE DEVICE CHECK  Result Date: 03/06/2023 Scheduled remote reviewed. Normal device function.  1 Fast A&V classified event, 1:1 atrial driven tachycardia, 176 bpm. Effective CRT Pacing 88.8%. Next remote 91 days. [facility], [facility]   ASSESSMENT & PLAN:   Chronic Thrombocytopenia. -will  get lab workup during this visit.   PLAN: -Discussed the reason for today's visit. None of the labs show anemia.  -Educated the patient about thrombocytopenia and risk of bleeding when platelet is around 50 K. Currently, patient does not have risk of bleeding. -Discussed that the next step is for lab workup to see if the patient has any specific gene for thrombocytopenia.  -Educated the patient and his wife on what could cause thrombocytopenia  -Discussed with the patient that we have labs since 05/11/2019 which shows that the  patient has always been thrombocytopenic. One lab results from 05/12/2019 showed that platelets were clumped.  -Answered all of patient's and his wife's questions regarding thrombocytopenia.  -Discussed with the patient that he is currently not on any medication that can cause thrombocytopenia.  -Discussed some medications that can cause thrombocytopenia: such as ibuprofen.  -Discussed that alcohol could cause slight change in platelets.  -Discussed option of ultrasound of liver and spleen if needed after lab results, patient agrees.  -Patient will get lab workup during this visit.   . Orders Placed This Encounter  Procedures   WBC/PLT in Citrate    Standing Status:   Future    Number of Occurrences:   1    Standing Expiration Date:   03/12/2024   CBC with Differential (Cancer Center Only)    Standing Status:   Future    Number of Occurrences:   1    Standing Expiration Date:   03/12/2024   CMP (Cancer Center only)    Standing Status:   Future    Number of Occurrences:   1    Standing Expiration Date:   03/12/2024   Lactate dehydrogenase    Standing Status:   Future    Number of Occurrences:   1    Standing Expiration Date:   03/12/2024   Vitamin B12    Standing Status:   Future    Number of Occurrences:   1    Standing Expiration Date:   03/12/2024   Hepatitis C antibody    Standing Status:   Future    Number of Occurrences:   1    Standing Expiration Date:   03/12/2024   Immature Platelet Fraction    Standing Status:   Future    Number of Occurrences:   1    Standing Expiration Date:   03/12/2024    FOLLOW-UP: Labs today Phone visit with Dr Candise Che in 1 week   .The total time spent in the appointment was 45 minutes* .  All of the patient's questions were answered with apparent satisfaction. The patient knows to call the clinic with any problems, questions or concerns.   Wyvonnia Lora MD MS AAHIVMS [facility] Hematology/Oncology Physician [facility]  .*Total  Encounter Time as defined by the Centers for Medicare and Medicaid Services includes, in addition to the face-to-face time of a patient visit (documented in the note above) non-face-to-face time: obtaining and reviewing outside history, ordering and reviewing medications, tests or procedures, care coordination (communications with other health care professionals or caregivers) and documentation in the medical record.  03/13/2023 2:20 PM   I,Param Shah,acting as a scribe for Wyvonnia Lora, MD.,have documented all relevant documentation on the behalf of Wyvonnia Lora, MD,as directed by  Wyvonnia Lora, MD while in the presence of Wyvonnia Lora, MD.   .I have reviewed the above documentation for accuracy and completeness, and I agree with the above. Johney Maine MD

## 2023-03-20 ENCOUNTER — Inpatient Hospital Stay: Payer: Medicare Other | Attending: Hematology | Admitting: Hematology

## 2023-03-20 DIAGNOSIS — D696 Thrombocytopenia, unspecified: Secondary | ICD-10-CM | POA: Diagnosis not present

## 2023-03-20 NOTE — Progress Notes (Signed)
HEMATOLOGY/ONCOLOGY CLINIC NOTE  Date of Service: 03/20/2023  Patient Care Team: Rodrigo Ran, MD as PCP - General Duke Salvia, MD as PCP - Electrophysiology (Cardiology) [place]az[place], Peter M, MD as PCP - Cardiology (Cardiology)  CHIEF COMPLAINTS/PURPOSE OF CONSULTATION:  Thrombocytopenia, unspecified.  HISTORY OF PRESENTING ILLNESS:  Steven Macdonald is a wonderful 88 y.o. male who has been referred to [place] by Dr. Rodrigo Ran for evaluation and management of Thrombocytopenia, unspecified. Chronic Low platelet counts. Last three lab results: platelets of 126 K on 12/09/2021, Platelets of 119 K on 07/20/2022, and platelets of 96 K on 02/01/2023.   Patient is accompanied by his wife during this visit. In the last 61-months to 1-year, patient notes his Warfarin has been changed to Eliquis. Patient had blood clots around late 2009, but he was placed on blood thinners in 2006. Patient had pacemaker placed in 2009, bu denies atrial fibrillations.   Patient notes he has skin bruises, but denies any abnormal bleeding. He denies any hematuria, black stool, or blood in stool. He denies any recent infection issues.   In the last 6 months, he has been feeling for fatigued and tired, which does not affect his daily activities. Patient associates fatigue to his age.  He regularly takes vitamin B-12 D-3 supplement.   Patient is complaint with all of his medications.   He denies any recent infection issues, fever, chills, night sweats, abdominal pain, abnormal bowel movement, chest pain, back pain, or leg swelling. He does complain of chronic constipation.   He denies any past medical history of liver disorders.   Patient currently drinks around 2 wine glasses a day. Patient shares half of wine bottle a day. Patient's wife notes that they used to drink 1 wine bottle a day, shared between them.   INTERVAL HISTORY: Steven Macdonald is a wonderful 88 y.o. male who is here for continued evaluation and  management of Thrombocytopenia, unspecified. Chronic Low platelet counts. Patient was initially seen by me on 03/13/2023.   .I connected with Saunders Glance on 03/20/2023 at  3:30 PM EDT by telephone visit and verified that I am speaking with the correct person using two identifiers.   Patient notes he has been doing well overall since our last visit. He denies any new infection issues, fever, chills, night sweats, unexpected weight loss, chest pain, back pain, or leg swelling.  Discussed lab results from 03/13/2023 in detail with the patient.   I discussed the limitations, risks, security and privacy concerns of performing an evaluation and management service by telemedicine and the availability of in-person appointments. I also discussed with the patient that there may be a patient responsible charge related to this service. The patient expressed understanding and agreed to proceed.   Other persons participating in the visit and their role in the encounter: None   Patient's location: Home  Provider's location: [facility]   Chief Complaint: Thrombocytopenia   MEDICAL HISTORY:  Past Medical History:  Diagnosis Date   Arthritis    Atrial flutter (HCC)    RFCA    BPH (benign prostatic hypertrophy)    Cardiomyopathy, nonischemic -resolved    Echo 2008: EF 25% Cardiac CT 2009: Normal cors EF 31% Echo 3/10 EF 45% Echo 6/13: EF 50-55%     CHF (congestive heart failure) (HCC)    Chicken pox    Colon polyps    hyperplastic   Diabetes mellitus (HCC)    ED (erectile dysfunction)    Gilbert's  syndrome    possible   Hemorrhoids    Herniated cervical disc    HLD (hyperlipidemia)    HTN (hypertension)    LBBB (left bundle branch block)    Orthostatic hypotension    Pacemaker  Medtronic    DOI 2/06   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HCC)    Pulmonary embolism (HCC)    Sinus node dysfunction (HCC)         SURGICAL HISTORY: Past Surgical History:  Procedure Laterality Date   BIV UPGRADE N/A  08/25/2020   Procedure: BIV PPM UPGRADE;  Surgeon: Duke Salvia, MD;  Location: [facility] INVASIVE CV LAB;  Service: Cardiovascular;  Laterality: N/A;   CARDIOVERSION     COLONOSCOPY WITH PROPOFOL N/A 01/20/2021   Procedure: COLONOSCOPY WITH PROPOFOL;  Surgeon: Hilarie Fredrickson, MD;  Location: [facility] ENDOSCOPY;  Service: Endoscopy;  Laterality: N/A;   coronary ablation     CORONARY CTO INTERVENTION N/A 09/07/2021   Procedure: CORONARY CTO INTERVENTION;  Surgeon: [place]az[place], Peter M, MD;  Location: [facility] INVASIVE CV LAB;  Service: Cardiovascular;  Laterality: N/A;   CORONARY ULTRASOUND/IVUS N/A 09/07/2021   Procedure: Intravascular Ultrasound/IVUS;  Surgeon: [place]azil[place], Peter M, MD;  Location: [facility] INVASIVE CV LAB;  Service: Cardiovascular;  Laterality: N/A;   CYSTOSCOPY W/ TRANSURETHRAL RESECTION OF POSTERIOR URETHERAL VALVES     LEFT HEART CATH AND CORONARY ANGIOGRAPHY N/A 08/02/2021   Procedure: LEFT HEART CATH AND CORONARY ANGIOGRAPHY;  Surgeon: Marykay Lex, MD;  Location: [facility] INVASIVE CV LAB;  Service: Cardiovascular;  Laterality: N/A;   PACEMAKER GENERATOR CHANGE N/A 05/13/2014   Procedure: PACEMAKER GENERATOR CHANGE;  Surgeon: Duke Salvia, MD;  Location: [facility] CATH LAB;  Service: Cardiovascular;  Laterality: N/A;   PACEMAKER INSERTION     POLYPECTOMY  01/20/2021   Procedure: POLYPECTOMY;  Surgeon: Hilarie Fredrickson, MD;  Location: [facility] ENDOSCOPY;  Service: Endoscopy;;    SOCIAL HISTORY: Social History   Socioeconomic History   Marital status: Married    Spouse name: Not on file   Number of children: 4   Years of education: Not on file   Highest education level: Not on file  Occupational History   Occupation: lawyer    Employer: SELF-EMPLOYED  Tobacco Use   Smoking status: Former    Current packs/day: 0.00    Types: Cigarettes    Quit date: 10/17/1970    Years since quitting: 52.4   Smokeless tobacco: Never  Vaping Use   Vaping status: Never Used  Substance and Sexual Activity   Alcohol use: Yes     Comment: Drink every day   Drug use: No   Sexual activity: Not on file  Other Topics Concern   Not on file  Social History Narrative   Not on file   Social Determinants of Health   Financial Resource Strain: Not on file  Food Insecurity: Not on file  Transportation Needs: Not on file  Physical Activity: Not on file  Stress: Not on file  Social Connections: Unknown (10/28/2021)   Received from [facility], [facility]   Social Network    Social Network: Not on file  Intimate Partner Violence: Unknown (09/20/2021)   Received from [employer], [facility]   HITS    Physically Hurt: Not on file    Insult or Talk Down To: Not on file    Threaten Physical Harm: Not on file    Scream or Curse: Not on file    FAMILY HISTORY: Family History  Problem Relation Age  of Onset   Breast cancer Mother    Colon polyps Father        pre-cancerous   Bladder Cancer Father     ALLERGIES:  is allergic to ezetimibe.  MEDICATIONS:  Current Outpatient Medications  Medication Sig Dispense Refill   apixaban (ELIQUIS) 5 MG TABS tablet Take 1 tablet (5 mg total) by mouth 2 (two) times daily. 180 tablet 3   cholecalciferol (VITAMIN D) 1000 UNITS tablet Take 1,000 Units by mouth daily.     Cyanocobalamin (VITAMIN B-12) 1000 MCG SUBL Place 1,000 mcg under the tongue daily.     docusate sodium (COLACE) 100 MG capsule Take 100 mg by mouth 2 (two) times daily.     finasteride (PROSCAR) 5 MG tablet Take 5 mg by mouth every evening.     metoprolol succinate (TOPROL-XL) 25 MG 24 hr tablet Take by mouth daily.     mirtazapine (REMERON) 15 MG tablet Take 15 mg by mouth at bedtime.     polyethylene glycol (MIRALAX / GLYCOLAX) 17 g packet Take 17 g by mouth daily.     Probiotic Product (ALIGN PO) Take 4 mg by mouth daily.     PSYLLIUM PO Take 10 mLs by mouth daily. Citrucel powder     rosuvastatin (CRESTOR) 20 MG tablet Take 20 mg by mouth daily.     sildenafil (REVATIO) 20 MG tablet Take 100 mg by  mouth daily as needed (erectile dysfunction).     tamsulosin (FLOMAX) 0.4 MG CAPS capsule Take 0.4 mg by mouth every morning. Monday, Tuesday, Friday     No current facility-administered medications for this visit.    REVIEW OF SYSTEMS:    10 Point review of Systems was done is negative except as noted above.  PHYSICAL EXAMINATION:telemedicine visit  LABORATORY DATA:  I have reviewed the data as listed  .    Latest Ref Rng & Units 03/13/2023    3:35 PM 09/12/2021    2:24 PM 09/09/2021    3:04 AM  CBC  WBC 4.0 - 10.5 K/uL 6.4  6.9  6.4   Hemoglobin 13.0 - 17.0 g/dL 57.8  46.9  62.9   Hematocrit 39.0 - 52.0 % 44.1  33.2  31.1   Platelets 150 - 400 K/uL 108  143  96     .    Latest Ref Rng & Units 03/13/2023    3:35 PM 09/08/2021    9:08 AM 09/08/2021    3:22 AM  CMP  Glucose 70 - 99 mg/dL 93  528  413   BUN 8 - 23 mg/dL 18  16  19    Creatinine 0.61 - 1.24 mg/dL 2.44  0.10  2.72   Sodium 135 - 145 mmol/L 140  137  141   Potassium 3.5 - 5.1 mmol/L 4.1  4.0  3.9   Chloride 98 - 111 mmol/L 107  108  113   CO2 22 - 32 mmol/L 29  25  26    Calcium 8.9 - 10.3 mg/dL 53.6  8.5  8.5   Total Protein 6.5 - 8.1 g/dL 6.5     Total Bilirubin 0.3 - 1.2 mg/dL 1.3     Alkaline Phos 38 - 126 U/L 53     AST 15 - 41 U/L 26     ALT 0 - 44 U/L 15        RADIOGRAPHIC STUDIES: I have personally reviewed the radiological images as listed and agreed with the findings in the report. CUP  PACEART REMOTE DEVICE CHECK  Result Date: 03/06/2023 Scheduled remote reviewed. Normal device function.  1 Fast A&V classified event, 1:1 atrial driven tachycardia, 176 bpm. Effective CRT Pacing 88.8%. Next remote 91 days. [facility], [facility]   ASSESSMENT & PLAN:   Chronic Thrombocytopenia. -will get lab workup during this visit.   PLAN: -Discussed lab results from 03/13/2023 in detail with the patient. CBC shows patient is thrombocytopenic with platelets of 108 K. CMP shows elevated creatinine at 1.43, but stable  overall. WBC and platelet in citrate is undetected. Immature platelet fraction of 3.6%. Hep-C antibody not reactive. Vitamin-B12 level at 852. LDH level at 156.  -Recommend to stay hydrated, drink at least 2 L of water everyday.  -Follow-up with PCP regarding elevated creatine.  -Discussed with the patient that labs does not show any ITP or any other cancer. Discussed with the patient that thrombocytopenia can be caused by medicine, chronic alcohol usage, and other reasons.  -Answered all of patient's questions.  -Discussed with the patient that he can try to cut down or hold alcohol consumption for a month to see if platelet improves.   FOLLOW-UP: RTC with Dr. Candise Che as need if PLT<70k RTC with PCP  The total time spent in the appointment was 20 minutes* .  All of the patient's questions were answered with apparent satisfaction. The patient knows to call the clinic with any problems, questions or concerns.   Wyvonnia Lora MD MS AAHIVMS [facility] Hematology/Oncology Physician [facility]  .*Total Encounter Time as defined by the Centers for Medicare and Medicaid Services includes, in addition to the face-to-face time of a patient visit (documented in the note above) non-face-to-face time: obtaining and reviewing outside history, ordering and reviewing medications, tests or procedures, care coordination (communications with other health care professionals or caregivers) and documentation in the medical record.   I,Param Shah,acting as a Neurosurgeon for Wyvonnia Lora, MD.,have documented all relevant documentation on the behalf of Wyvonnia Lora, MD,as directed by  Wyvonnia Lora, MD while in the presence of Wyvonnia Lora, MD. .I have reviewed the above documentation for accuracy and completeness, and I agree with the above. Johney Maine MD

## 2023-03-21 NOTE — Progress Notes (Signed)
Remote pacemaker transmission.   

## 2023-03-24 DIAGNOSIS — Z23 Encounter for immunization: Secondary | ICD-10-CM | POA: Diagnosis not present

## 2023-04-13 ENCOUNTER — Other Ambulatory Visit (HOSPITAL_COMMUNITY): Payer: Self-pay

## 2023-04-27 DIAGNOSIS — Z23 Encounter for immunization: Secondary | ICD-10-CM | POA: Diagnosis not present

## 2023-05-14 ENCOUNTER — Other Ambulatory Visit (HOSPITAL_COMMUNITY): Payer: Self-pay

## 2023-06-04 ENCOUNTER — Ambulatory Visit: Payer: Medicare Other

## 2023-06-04 DIAGNOSIS — I495 Sick sinus syndrome: Secondary | ICD-10-CM

## 2023-06-04 LAB — CUP PACEART REMOTE DEVICE CHECK
Battery Remaining Longevity: 45 mo
Battery Voltage: 2.95 V
Brady Statistic AP VP Percent: 84.2 %
Brady Statistic AP VS Percent: 0.69 %
Brady Statistic AS VP Percent: 10.45 %
Brady Statistic AS VS Percent: 4.66 %
Brady Statistic RA Percent Paced: 87.89 %
Brady Statistic RV Percent Paced: 94.65 %
Date Time Interrogation Session: 20241216000636
Implantable Lead Connection Status: 753985
Implantable Lead Connection Status: 753985
Implantable Lead Connection Status: 753985
Implantable Lead Implant Date: 20060223
Implantable Lead Implant Date: 20060223
Implantable Lead Implant Date: 20220309
Implantable Lead Location: 753858
Implantable Lead Location: 753859
Implantable Lead Location: 753860
Implantable Lead Model: 4598
Implantable Lead Model: 5076
Implantable Lead Model: 5076
Implantable Pulse Generator Implant Date: 20220309
Lead Channel Impedance Value: 342 Ohm
Lead Channel Impedance Value: 399 Ohm
Lead Channel Impedance Value: 399 Ohm
Lead Channel Impedance Value: 437 Ohm
Lead Channel Impedance Value: 456 Ohm
Lead Channel Impedance Value: 494 Ohm
Lead Channel Impedance Value: 494 Ohm
Lead Channel Impedance Value: 513 Ohm
Lead Channel Impedance Value: 665 Ohm
Lead Channel Impedance Value: 703 Ohm
Lead Channel Impedance Value: 703 Ohm
Lead Channel Impedance Value: 931 Ohm
Lead Channel Impedance Value: 950 Ohm
Lead Channel Impedance Value: 969 Ohm
Lead Channel Pacing Threshold Amplitude: 0.625 V
Lead Channel Pacing Threshold Amplitude: 0.625 V
Lead Channel Pacing Threshold Amplitude: 2.125 V
Lead Channel Pacing Threshold Pulse Width: 0.4 ms
Lead Channel Pacing Threshold Pulse Width: 0.4 ms
Lead Channel Pacing Threshold Pulse Width: 0.8 ms
Lead Channel Sensing Intrinsic Amplitude: 2.125 mV
Lead Channel Sensing Intrinsic Amplitude: 2.125 mV
Lead Channel Sensing Intrinsic Amplitude: 8.375 mV
Lead Channel Sensing Intrinsic Amplitude: 8.375 mV
Lead Channel Setting Pacing Amplitude: 1.5 V
Lead Channel Setting Pacing Amplitude: 2.5 V
Lead Channel Setting Pacing Amplitude: 3.25 V
Lead Channel Setting Pacing Pulse Width: 0.4 ms
Lead Channel Setting Pacing Pulse Width: 0.8 ms
Lead Channel Setting Sensing Sensitivity: 1.2 mV
Zone Setting Status: 755011

## 2023-06-16 ENCOUNTER — Other Ambulatory Visit (HOSPITAL_COMMUNITY): Payer: Self-pay

## 2023-06-19 NOTE — Progress Notes (Signed)
 Cardiology Office Note   Date:  06/25/2023   ID:  Steven Macdonald, DOB [birth year], MRN [id number]  PCP:  Shayne Anes, MD  Cardiologist:   Minah Axelrod, MD   Chief Complaint  Patient presents with   Follow-up    6 months.      History of Present Illness: Steven Macdonald is a 88 y.o. male who is seen for follow up CAD.  He has a history of atrial flutter s/p ablation, cardiomyopathy. chronic diastolic CHF, DM, hyperlipidemia, HTN, LBBB, orthostatic hypotension, sinus node dysfunction s/p pacemaker placement, CAD and PE on lifelong coumadin  therapy. He is followed in our office by Dr. Fernande. He is known to have CAD with coronary CTA in June 2019 showing occlusion of the proximal LAD. Minimal disease in the RCA and Circumflex. Echo August 2022 with LVEF=30-35% with global hypokinesis, mild AI. He has been on a beta blocker and Entresto . He is on coumadin  therapy given history of PE and atrial flutter. He was seen in Feb 2023 by Dr Verlin as a work in for evaluation of chest pain.  Bisoprolol  was stopped and Imdur  15 mg daily was started.  He was scheduled for cardiac cath with bridging Lovenox . Cardiac cath was performed by Dr Anner on 08/02/21. This showed a CTO of the proximal LAD with collaterals. Ranexa  was added for therapy and Plavix .   He presented on 09/07/2021 for planned CTO PCI of LAD.  PCI of the LAD was successful and DES was placed using IVUS guidance. He tolerated the procedure well but was noted to be hypotensive following procedure requiring fluids. Hypotension persisted throughout the night. Bedside Echo showed no evidence of pericardial effusion. He was noted to have almost a 4 g/dL drop in her hemoglobin from 14.4 to 10.5. He was asymptomatic with this though. Abdominal/pelvic CT was ordered and revealed a mild left-sided retroperitoneal hemorrhage, and mild hemorrhage within the left lower quadrant abdominal wall soft tissue. Formal Echo on 3/23 showed normal LV function with  no mention of pericardial effusion. Home Coumadin  was held. Hemoglobin stable at 10.3 on day of discharge. Initial plan was for triple therapy with Aspirin , Plavix , and Coumadin  for 1 month at which time Aspirin  can be stopped. However, given retroperitoneal bleed, will hold Coumadin  for 1 week. Given PCI to LAD, will have to continue Aspirin  and Plavix .  Imdur  and Ranexa  were discontinued. Hgb improved to 11.0 on outpatient follow up. Entresto  also discontinued due to hypotension.  He continues to do well. No angina. Still walking 4 miles a day. No bleeding. Now switched from Coumadin  to Eliquis . No dizziness or palpitations. Energy level is good.     Past Medical History:  Diagnosis Date   Arthritis    Atrial flutter (HCC)    RFCA    BPH (benign prostatic hypertrophy)    Cardiomyopathy, nonischemic -resolved    Echo 2008: EF 25% Cardiac CT 2009: Normal cors EF 31% Echo 3/10 EF 45% Echo 6/13: EF 50-55%     CHF (congestive heart failure) (HCC)    Chicken pox    Colon polyps    hyperplastic   Diabetes mellitus (HCC)    ED (erectile dysfunction)    Gilbert's syndrome    possible   Hemorrhoids    Herniated cervical disc    HLD (hyperlipidemia)    HTN (hypertension)    LBBB (left bundle branch block)    Orthostatic hypotension    Pacemaker  Medtronic    DOI 2/06  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HCC)    Pulmonary embolism (HCC)    Sinus node dysfunction [facility])         Past Surgical History:  Procedure Laterality Date   BIV UPGRADE N/A 08/25/2020   Procedure: BIV PPM UPGRADE;  Surgeon: Fernande Elspeth BROCKS, MD;  Location: [facility] INVASIVE CV LAB;  Service: Cardiovascular;  Laterality: N/A;   CARDIOVERSION     COLONOSCOPY WITH PROPOFOL  N/A 01/20/2021   Procedure: COLONOSCOPY WITH PROPOFOL ;  Surgeon: Abran Norleen SAILOR, MD;  Location: [facility] ENDOSCOPY;  Service: Endoscopy;  Laterality: N/A;   coronary ablation     CORONARY CTO INTERVENTION N/A 09/07/2021   Procedure: CORONARY CTO INTERVENTION;  Surgeon: Seletha Zimmermann,  Royer Cristobal M, MD;  Location: [facility] INVASIVE CV LAB;  Service: Cardiovascular;  Laterality: N/A;   CORONARY ULTRASOUND/IVUS N/A 09/07/2021   Procedure: Intravascular Ultrasound/IVUS;  Surgeon: Jancarlo Biermann M, MD;  Location: [facility] INVASIVE CV LAB;  Service: Cardiovascular;  Laterality: N/A;   CYSTOSCOPY W/ TRANSURETHRAL RESECTION OF POSTERIOR URETHERAL VALVES     LEFT HEART CATH AND CORONARY ANGIOGRAPHY N/A 08/02/2021   Procedure: LEFT HEART CATH AND CORONARY ANGIOGRAPHY;  Surgeon: Anner Alm ORN, MD;  Location: [facility] INVASIVE CV LAB;  Service: Cardiovascular;  Laterality: N/A;   PACEMAKER GENERATOR CHANGE N/A 05/13/2014   Procedure: PACEMAKER GENERATOR CHANGE;  Surgeon: Elspeth BROCKS Fernande, MD;  Location: [facility] CATH LAB;  Service: Cardiovascular;  Laterality: N/A;   PACEMAKER INSERTION     POLYPECTOMY  01/20/2021   Procedure: POLYPECTOMY;  Surgeon: Abran Norleen SAILOR, MD;  Location: [facility] ENDOSCOPY;  Service: Endoscopy;;     Current Outpatient Medications  Medication Sig Dispense Refill   apixaban  (ELIQUIS ) 5 MG TABS tablet Take 1 tablet (5 mg total) by mouth 2 (two) times daily. 180 tablet 3   cholecalciferol  (VITAMIN D ) 1000 UNITS tablet Take 1,000 Units by mouth daily.     Cyanocobalamin  (VITAMIN B-12) 1000 MCG SUBL Place 1,000 mcg under the tongue daily.     docusate sodium  (COLACE) 100 MG capsule Take 100 mg by mouth 2 (two) times daily.     finasteride  (PROSCAR ) 5 MG tablet Take 5 mg by mouth every evening.     metoprolol  succinate (TOPROL -XL) 25 MG 24 hr tablet Take by mouth daily.     mirtazapine  (REMERON ) 15 MG tablet Take 15 mg by mouth at bedtime.     polyethylene glycol (MIRALAX  / GLYCOLAX ) 17 g packet Take 17 g by mouth daily.     Probiotic Product (ALIGN PO) Take 4 mg by mouth daily.     PSYLLIUM PO Take 10 mLs by mouth daily. Citrucel powder     rosuvastatin  (CRESTOR ) 20 MG tablet Take 20 mg by mouth daily.     sildenafil  (REVATIO ) 20 MG tablet Take 100 mg by mouth daily as needed (erectile dysfunction).      tamsulosin  (FLOMAX ) 0.4 MG CAPS capsule Take 0.4 mg by mouth every morning. Monday, Tuesday, Friday     No current facility-administered medications for this visit.    Allergies:   Ezetimibe    Social History:  The patient  reports that he quit smoking about 52 years ago. His smoking use included cigarettes. He has never used smokeless tobacco. He reports current alcohol  use. He reports that he does not use drugs.   Family History:  The patient's family history includes Bladder Cancer in his father; Breast cancer in his mother; Colon polyps in his father.    ROS:  Please see the history of present illness.  Otherwise, review of systems are positive for none.   All other systems are reviewed and negative.    PHYSICAL EXAM: VS:  BP 110/70 (BP Location: Left Arm, Patient Position: Sitting, Cuff Size: Normal)   Pulse 72   Ht 6' 3 (1.905 m)   Wt 175 lb (79.4 kg)   BMI 21.87 kg/m  , BMI Body mass index is 21.87 kg/m. GEN: Well nourished, well developed, in no acute distress HEENT: normal Neck: no JVD, carotid bruits, or masses Cardiac: RRR; no murmurs, rubs, or gallops,no edema  Respiratory:  clear to auscultation bilaterally, normal work of breathing GI: soft, nontender, nondistended, + BS MS: no deformity or atrophy Skin: warm and dry, no rash Neuro:  Strength and sensation are intact Psych: euthymic mood, full affect         Recent Labs: 03/13/2023: ALT 15; BUN 18; Creatinine 1.43; Hemoglobin 14.5; Platelet Count 108; Potassium 4.1; Sodium 140    Lipid Panel    Component Value Date/Time   CHOL 130 08/31/2021 1120   TRIG 81 08/31/2021 1120   HDL 54 08/31/2021 1120   CHOLHDL 2.4 08/31/2021 1120   LDLCALC 60 08/31/2021 1120     Dated 10/22/20: cholesterol 135, triglycerides 63, HDL 53, LDL 69.  Dated 05/04/21: A1c 5.5%. LFTs normal. Dated 12/05/21: cholesterol 119, triglycerides 63, HDL 39, LDL 67. CMET and CBC normal. A1c 5.6%. normal TSH. Dated 02/01/23:  cholesterol 127, triglycerides 83, HDL 50, LDL 60.   Wt Readings from Last 3 Encounters:  06/25/23 175 lb (79.4 kg)  03/13/23 179 lb 3.2 oz (81.3 kg)  01/19/23 175 lb (79.4 kg)      Other studies Reviewed: Additional studies/ records that were reviewed today include:   Coronary CTA 10/24/17: ADDENDUM REPORT: 10/24/2017 17:01   CLINICAL DATA:  Chest pain   EXAM: Cardiac CTA   MEDICATIONS: Sub lingual nitro.  4mg  x 2 and lopressor  5mg  IV   TECHNIQUE: The patient was scanned on a Siemens 192 slice scanner. Gantry rotation speed was 250 msecs. Collimation was 0.6 mm. A 100 kV prospective scan was triggered in the ascending thoracic aorta at 35-75% of the R-R interval. Average HR during the scan was 60 bpm. The 3D data set was interpreted on a dedicated work station using MPR, MIP and VRT modes. A total of 80cc of contrast was used.   FINDINGS: Non-cardiac: See separate report from [facility].   Pacemaker lead in right heart.   Calcium  Score: 20 Agatston units.   Coronary Arteries: Right dominant with no anomalies   LM: No plaque or stenosis.   LAD system: The proximal LAD appears to be subtotally occluded.   Circumflex system: Small-moderate ramus, no significant disease. No plaque or stenosis in LCx system.   RCA system: Mixed plaque proximal RCA, no significant stenosis.   IMPRESSION: 1. Coronary artery calcium  score 20 Agatston units. This places the patient in the 8th percentile for age/gender, suggesting low risk for future cardiac events.   2. The proximal LAD appears subtotally occluded. Will confirm by FFR.   Dalton Mclean     Electronically Signed   By: Ezra Shuck M.D.   On: 10/24/2017 17:01  CLINICAL DATA:  Chest pain   EXAM: CT FFR   MEDICATIONS: No additional medications.   TECHNIQUE: The coronary CTA was sent for CT FFR.   FINDINGS: FFR 0.89 distal RCA FFR 0.86 distal LCx   The proximal LAD appears occluded. There is  collateral filling distally.  IMPRESSION: Occluded proximal LAD.   Dalton Mclean     Electronically Signed   By: Ezra Shuck M.D.   On: 10/25/2017 14:32    Echo 02/09/21: IMPRESSIONS     1. Moderate hypertrophy of the basal septum with otherwise mild  concentric LVH. Global hypokinesis worse in the septum. Compared with the  echo 06/2020, systolic function has improved. Left ventricular ejection  fraction, by estimation, is 30 to 35%. The  left ventricle has moderately decreased function. The left ventricle  demonstrates global hypokinesis. There is moderate left ventricular  hypertrophy. Left ventricular diastolic parameters are consistent with  Grade I diastolic dysfunction (impaired  relaxation).   2. Right ventricular systolic function is normal. The right ventricular  size is normal.   3. The mitral valve is normal in structure. Trivial mitral valve  regurgitation. No evidence of mitral stenosis.   4. The aortic valve is normal in structure. Aortic valve regurgitation is  mild. No aortic stenosis is present.   5. Aortic dilatation noted. There is mild dilatation of the aortic root,  measuring 39 mm. There is mild dilatation of the ascending aorta,  measuring 42 mm.   6. The inferior vena cava is normal in size with greater than 50%  respiratory variability, suggesting right atrial pressure of 3 mmHg.   LEFT HEART CATH AND CORONARY ANGIOGRAPHY   Conclusion      Mid LAD lesion is 100% stenosed. ->  Fills via multiple septal perforator and distal PDA collaterals all the way to the occlusion site.  Favorable for CTO PCI.   LV end diastolic pressure is low.   Very tortuous right brachial artery, required versa core wire catheter steering to manipulate   Summary Severe single-vessel CAD with CTO of the proximal to mid LAD with collateral flow filling the majority of the LAD from the RPDA septal perforators. Otherwise angiographically normal coronary arteries with a  codominant system.     Recommendations: We will add Plavix  after loading 300 mg prior to discharge Also start Ranexa  500 g twice daily He will be discharged home today and plan for staged PCI (CTO PCI) with Dr. Adan Beal on March 22     Alm Clay, MD   Diagnostic Dominance: Co-dominant Interve Conclusion      Mid LAD lesion is 100% stenosed.   A drug-eluting stent was successfully placed using a SYNERGY XD 3.0X48.   Post intervention, there is a 0% residual stenosis.   Successful CTO PCI of the LAD with IVUS guidance and DES x 1     Plan: observe overnight. Plan to resume coumadin  tonight without bridging. ASA 81 mg x 1 month. Plavix  75 mg daily for at least 6 months.    Intervention  Intervention    Echo 09/08/21: IMPRESSIONS     1. Moderate hypertrophy of the basal septum with otherwise mild LVH.  Compared with the echo 04/2019, systolic function has improved. Left  ventricular ejection fraction, by estimation, is 55 to 60%. The left  ventricle has normal function. There is  moderate left ventricular hypertrophy of the basal-septal segment. Left  ventricular diastolic parameters are consistent with Grade I diastolic  dysfunction (impaired relaxation).   2. Right ventricular systolic function is normal. The right ventricular  size is normal.   3. The mitral valve is normal in structure. Trivial mitral valve  regurgitation. No evidence of mitral stenosis.   4. The aortic valve is tricuspid. There is mild calcification of the  aortic valve. There  is mild thickening of the aortic valve. Aortic valve  regurgitation is trivial.   5. Aortic dilatation noted. There is mild dilatation of the aortic root,  measuring 38 mm. There is mild dilatation of the ascending aorta,  measuring 38 mm.   ASSESSMENT AND PLAN:  1.  CAD Now s/p successful CTO PCI with DES in March 2023. LV function with marked improvement post PCI with normal EF.  Anginal symptoms resolved. Continue  Eliquis  2. Chronic systolic CHF with ischemic CM. EF substantially improved following PCI.  Now only on low dose Toprol .  Will repeat Echo since it has been 2 years 3. History of PE on chronic Eliquis  4. Sinus node dysfunction s/p PPM/BIV ICD. Stable device function. 5. Hyperlipidemia. On statin. Goal LDL < 70. Last LDL 60.  6. HCM. Patient does have chronic basal septal hypertrophy. No outflow gradient. On mild end of spectrum. Will update Echo    Disposition:   FU with me 6 months    Signed, Jermisha Hoffart, MD  06/25/2023 3:14 PM    [facility] [address], [place], [place], [address] Phone [phone], Fax [phone]

## 2023-06-25 ENCOUNTER — Encounter: Payer: Self-pay | Admitting: Cardiology

## 2023-06-25 ENCOUNTER — Ambulatory Visit: Payer: Medicare Other | Attending: Cardiology | Admitting: Cardiology

## 2023-06-25 VITALS — BP 110/70 | HR 72 | Ht 75.0 in | Wt 175.0 lb

## 2023-06-25 DIAGNOSIS — I25110 Atherosclerotic heart disease of native coronary artery with unstable angina pectoris: Secondary | ICD-10-CM | POA: Diagnosis not present

## 2023-06-25 DIAGNOSIS — I5022 Chronic systolic (congestive) heart failure: Secondary | ICD-10-CM | POA: Insufficient documentation

## 2023-06-25 DIAGNOSIS — Z950 Presence of cardiac pacemaker: Secondary | ICD-10-CM | POA: Insufficient documentation

## 2023-06-25 DIAGNOSIS — I422 Other hypertrophic cardiomyopathy: Secondary | ICD-10-CM | POA: Insufficient documentation

## 2023-06-25 DIAGNOSIS — I447 Left bundle-branch block, unspecified: Secondary | ICD-10-CM | POA: Diagnosis not present

## 2023-06-25 NOTE — Patient Instructions (Signed)
 Medication Instructions:  Continue same medications *If you need a refill on your cardiac medications before your next appointment, please call your pharmacy*   Lab Work: None ordered   Testing/Procedures: Schedule Echo  first available   Follow-Up: At [facility], you and your health needs are our priority.  As part of our continuing mission to provide you with exceptional heart care, we have created designated Provider Care Teams.  These Care Teams include your primary Cardiologist (physician) and Advanced Practice Providers (APPs -  Physician Assistants and Nurse Practitioners) who all work together to provide you with the care you need, when you need it.  We recommend signing up for the patient portal called MyChart.  Sign up information is provided on this After Visit Summary.  MyChart is used to connect with patients for Virtual Visits (Telemedicine).  Patients are able to view lab/test results, encounter notes, upcoming appointments, etc.  Non-urgent messages can be sent to your provider as well.   To learn more about what you can do with MyChart, go to [email or web address].    Your next appointment:  6 months   Call in March to schedule July appointment     Provider:  Dr.Jordan

## 2023-06-25 NOTE — Addendum Note (Signed)
 Addended by: Neoma Laming on: 06/25/2023 03:19 PM   Modules accepted: Orders

## 2023-07-11 NOTE — Progress Notes (Signed)
Remote pacemaker transmission.   

## 2023-07-26 ENCOUNTER — Ambulatory Visit (HOSPITAL_COMMUNITY)
Admission: RE | Admit: 2023-07-26 | Discharge: 2023-07-26 | Disposition: A | Discharge status: Home / Self Care | Payer: Medicare Other | Source: Ambulatory Visit | Attending: Cardiovascular Disease | Admitting: Cardiovascular Disease

## 2023-07-26 DIAGNOSIS — I25110 Atherosclerotic heart disease of native coronary artery with unstable angina pectoris: Secondary | ICD-10-CM | POA: Insufficient documentation

## 2023-07-26 DIAGNOSIS — Z950 Presence of cardiac pacemaker: Secondary | ICD-10-CM | POA: Insufficient documentation

## 2023-07-26 DIAGNOSIS — I447 Left bundle-branch block, unspecified: Secondary | ICD-10-CM | POA: Insufficient documentation

## 2023-07-26 DIAGNOSIS — I422 Other hypertrophic cardiomyopathy: Secondary | ICD-10-CM | POA: Insufficient documentation

## 2023-07-26 DIAGNOSIS — I5022 Chronic systolic (congestive) heart failure: Secondary | ICD-10-CM | POA: Diagnosis not present

## 2023-07-26 LAB — ECHOCARDIOGRAM COMPLETE
AR max vel: 2.53 cm2
AV Area VTI: 2.56 cm2
AV Area mean vel: 2.5 cm2
AV Mean grad: 2 mm[Hg]
AV Peak grad: 4.5 mm[Hg]
Ao pk vel: 1.06 m/s
Area-P 1/2: 2.14 cm2
MV M vel: 2.43 m/s
MV Peak grad: 23.6 mm[Hg]
P 1/2 time: 589 ms
S' Lateral: 2.72 cm

## 2023-08-23 DIAGNOSIS — D6869 Other thrombophilia: Secondary | ICD-10-CM | POA: Diagnosis not present

## 2023-08-23 DIAGNOSIS — I480 Paroxysmal atrial fibrillation: Secondary | ICD-10-CM | POA: Diagnosis not present

## 2023-08-23 DIAGNOSIS — Z7901 Long term (current) use of anticoagulants: Secondary | ICD-10-CM | POA: Diagnosis not present

## 2023-08-23 DIAGNOSIS — I10 Essential (primary) hypertension: Secondary | ICD-10-CM | POA: Diagnosis not present

## 2023-08-23 DIAGNOSIS — M79604 Pain in right leg: Secondary | ICD-10-CM | POA: Diagnosis not present

## 2023-09-03 ENCOUNTER — Ambulatory Visit (INDEPENDENT_AMBULATORY_CARE_PROVIDER_SITE_OTHER): Payer: Medicare Other

## 2023-09-03 DIAGNOSIS — I495 Sick sinus syndrome: Secondary | ICD-10-CM

## 2023-09-07 LAB — CUP PACEART REMOTE DEVICE CHECK
Battery Remaining Longevity: 40 mo
Battery Voltage: 2.94 V
Brady Statistic AP VP Percent: 85.61 %
Brady Statistic AP VS Percent: 0.68 %
Brady Statistic AS VP Percent: 9.84 %
Brady Statistic AS VS Percent: 3.87 %
Brady Statistic RA Percent Paced: 88.85 %
Brady Statistic RV Percent Paced: 95.45 %
Date Time Interrogation Session: 20250321103249
Implantable Lead Connection Status: 753985
Implantable Lead Connection Status: 753985
Implantable Lead Connection Status: 753985
Implantable Lead Implant Date: 20060223
Implantable Lead Implant Date: 20060223
Implantable Lead Implant Date: 20220309
Implantable Lead Location: 753858
Implantable Lead Location: 753859
Implantable Lead Location: 753860
Implantable Lead Model: 4598
Implantable Lead Model: 5076
Implantable Lead Model: 5076
Implantable Pulse Generator Implant Date: 20220309
Lead Channel Impedance Value: 1026 Ohm
Lead Channel Impedance Value: 1045 Ohm
Lead Channel Impedance Value: 1083 Ohm
Lead Channel Impedance Value: 323 Ohm
Lead Channel Impedance Value: 399 Ohm
Lead Channel Impedance Value: 399 Ohm
Lead Channel Impedance Value: 456 Ohm
Lead Channel Impedance Value: 456 Ohm
Lead Channel Impedance Value: 494 Ohm
Lead Channel Impedance Value: 513 Ohm
Lead Channel Impedance Value: 532 Ohm
Lead Channel Impedance Value: 665 Ohm
Lead Channel Impedance Value: 722 Ohm
Lead Channel Impedance Value: 779 Ohm
Lead Channel Pacing Threshold Amplitude: 0.625 V
Lead Channel Pacing Threshold Amplitude: 0.75 V
Lead Channel Pacing Threshold Amplitude: 2.5 V
Lead Channel Pacing Threshold Pulse Width: 0.4 ms
Lead Channel Pacing Threshold Pulse Width: 0.4 ms
Lead Channel Pacing Threshold Pulse Width: 0.8 ms
Lead Channel Sensing Intrinsic Amplitude: 2.25 mV
Lead Channel Sensing Intrinsic Amplitude: 2.25 mV
Lead Channel Sensing Intrinsic Amplitude: 5.75 mV
Lead Channel Sensing Intrinsic Amplitude: 5.75 mV
Lead Channel Setting Pacing Amplitude: 1.5 V
Lead Channel Setting Pacing Amplitude: 2.5 V
Lead Channel Setting Pacing Amplitude: 3.25 V
Lead Channel Setting Pacing Pulse Width: 0.4 ms
Lead Channel Setting Pacing Pulse Width: 0.8 ms
Lead Channel Setting Sensing Sensitivity: 1.2 mV
Zone Setting Status: 755011

## 2023-09-24 DIAGNOSIS — Z85828 Personal history of other malignant neoplasm of skin: Secondary | ICD-10-CM | POA: Diagnosis not present

## 2023-09-24 DIAGNOSIS — L820 Inflamed seborrheic keratosis: Secondary | ICD-10-CM | POA: Diagnosis not present

## 2023-10-01 ENCOUNTER — Encounter: Payer: Self-pay | Admitting: Internal Medicine

## 2023-10-15 DIAGNOSIS — H6122 Impacted cerumen, left ear: Secondary | ICD-10-CM | POA: Diagnosis not present

## 2023-10-23 NOTE — Progress Notes (Signed)
 Remote pacemaker transmission.

## 2023-10-23 NOTE — Addendum Note (Signed)
 Addended by: Edra Govern D on: 10/23/2023 02:10 PM   Modules accepted: Orders

## 2023-11-16 DIAGNOSIS — H52223 Regular astigmatism, bilateral: Secondary | ICD-10-CM | POA: Diagnosis not present

## 2023-11-16 DIAGNOSIS — H26493 Other secondary cataract, bilateral: Secondary | ICD-10-CM | POA: Diagnosis not present

## 2023-12-10 ENCOUNTER — Ambulatory Visit (INDEPENDENT_AMBULATORY_CARE_PROVIDER_SITE_OTHER)

## 2023-12-10 DIAGNOSIS — I495 Sick sinus syndrome: Secondary | ICD-10-CM

## 2023-12-11 ENCOUNTER — Ambulatory Visit: Payer: Self-pay | Admitting: Cardiology

## 2023-12-11 LAB — CUP PACEART REMOTE DEVICE CHECK
Battery Remaining Longevity: 34 mo
Battery Voltage: 2.93 V
Brady Statistic AP VP Percent: 84.78 %
Brady Statistic AP VS Percent: 0.63 %
Brady Statistic AS VP Percent: 11.28 %
Brady Statistic AS VS Percent: 3.32 %
Brady Statistic RA Percent Paced: 87.74 %
Brady Statistic RV Percent Paced: 96.04 %
Date Time Interrogation Session: 20250623000924
Implantable Lead Connection Status: 753985
Implantable Lead Connection Status: 753985
Implantable Lead Connection Status: 753985
Implantable Lead Implant Date: 20060223
Implantable Lead Implant Date: 20060223
Implantable Lead Implant Date: 20220309
Implantable Lead Location: 753858
Implantable Lead Location: 753859
Implantable Lead Location: 753860
Implantable Lead Model: 4598
Implantable Lead Model: 5076
Implantable Lead Model: 5076
Implantable Pulse Generator Implant Date: 20220309
Lead Channel Impedance Value: 1064 Ohm
Lead Channel Impedance Value: 1083 Ohm
Lead Channel Impedance Value: 1140 Ohm
Lead Channel Impedance Value: 342 Ohm
Lead Channel Impedance Value: 418 Ohm
Lead Channel Impedance Value: 418 Ohm
Lead Channel Impedance Value: 456 Ohm
Lead Channel Impedance Value: 475 Ohm
Lead Channel Impedance Value: 494 Ohm
Lead Channel Impedance Value: 513 Ohm
Lead Channel Impedance Value: 513 Ohm
Lead Channel Impedance Value: 684 Ohm
Lead Channel Impedance Value: 741 Ohm
Lead Channel Impedance Value: 798 Ohm
Lead Channel Pacing Threshold Amplitude: 0.5 V
Lead Channel Pacing Threshold Amplitude: 0.625 V
Lead Channel Pacing Threshold Amplitude: 2.125 V
Lead Channel Pacing Threshold Pulse Width: 0.4 ms
Lead Channel Pacing Threshold Pulse Width: 0.4 ms
Lead Channel Pacing Threshold Pulse Width: 0.8 ms
Lead Channel Sensing Intrinsic Amplitude: 2.125 mV
Lead Channel Sensing Intrinsic Amplitude: 2.125 mV
Lead Channel Sensing Intrinsic Amplitude: 6.5 mV
Lead Channel Sensing Intrinsic Amplitude: 6.5 mV
Lead Channel Setting Pacing Amplitude: 1.5 V
Lead Channel Setting Pacing Amplitude: 2.5 V
Lead Channel Setting Pacing Amplitude: 3.25 V
Lead Channel Setting Pacing Pulse Width: 0.4 ms
Lead Channel Setting Pacing Pulse Width: 0.8 ms
Lead Channel Setting Sensing Sensitivity: 1.2 mV
Zone Setting Status: 755011

## 2023-12-19 ENCOUNTER — Other Ambulatory Visit (INDEPENDENT_AMBULATORY_CARE_PROVIDER_SITE_OTHER): Payer: Self-pay

## 2023-12-19 ENCOUNTER — Ambulatory Visit (INDEPENDENT_AMBULATORY_CARE_PROVIDER_SITE_OTHER): Admitting: Orthopaedic Surgery

## 2023-12-19 DIAGNOSIS — M545 Low back pain, unspecified: Secondary | ICD-10-CM

## 2023-12-19 MED ORDER — PREDNISONE 50 MG PO TABS: ORAL_TABLET | ORAL | 0 refills | Status: DC

## 2023-12-19 MED ORDER — GABAPENTIN 100 MG PO CAPS: 100.0000 mg | ORAL_CAPSULE | Freq: Every day | ORAL | 0 refills | Status: DC

## 2023-12-19 NOTE — Progress Notes (Signed)
 The patient is an 89 year old gentleman who comes in with about 3 weeks of worsening low back pain that is a sharp shooting pain to the left side.  He denies any change in bowel or bladder function.  He says the pain is with standing and walking.  He does like to walk daily for exercise.  He denies any radicular pain going down his legs.  He denies any weakness in his legs.  He is not a diabetic.  He is on Eliquis  so he cannot take blood thinning medications.  When I have him stand he does have poor posture as a relates to him fully standing up.  He has significant limitations with flexion and he can only reach over to about the mid thigh to knee level with both hands.  Back extension is somewhat limited as well and it does cause pain and seems like his pain is to palpation in the lower aspect of the lumbar spine more to the left side than the right.  There is no rash that I can see.  He has good strength in his bilateral lower extremities on isolated exam of both extremities and normal sensation.  X-rays of the lumbar spine shows significant degenerative changes at all levels.  There is loss of lumbar lordosis, significant to space narrowing and osteophytes at multiple levels throughout the lumbar spine and the lower posterior elements.  Likely this is some degenerative changes from his lumbar spine that are getting worse at the facet joints.  I would like to start him on prednisone  for a few days to see if this will calm down his inflammation as well as Neurontin at night.  The Neurontin will be just 100 mg but he can increase this to 200 if he is tolerating it well.  The prednisone  we 50 mg daily for 5 days.  At this point we will also obtain a MRI of his lumbar spine to see if there is nerve compression to the left side or any area that he could potentially tolerate a steroid injection.  I have recommended topical Voltaren gel as well which will be safer with being on blood thinning medications.  All  question concerns were answered addressed.  Will see him back once we have the MRI of his lumbar spine.

## 2023-12-20 ENCOUNTER — Other Ambulatory Visit: Payer: Self-pay

## 2023-12-20 DIAGNOSIS — M545 Low back pain, unspecified: Secondary | ICD-10-CM

## 2023-12-31 ENCOUNTER — Ambulatory Visit: Admitting: Orthopaedic Surgery

## 2024-01-03 NOTE — CV Procedure (Signed)
  Device system confirmed to be MRI conditional, with implant date > 6 weeks ago, and no evidence of abandoned or epicardial leads in review of most recent CXR  Device last cleared by EP Provider: Prentice Passey 01/03/24  Clearance is good through for 1 year as long as parameters remain stable at time of check. If pt undergoes a cardiac device procedure during that time, they should be re-cleared.   Tachy-therapies to be programmed off if applicable with device back to pre-MRI settings after completion of exam.  Medtronic - Programming recommendation received through Medtronic App/Tablet  Steven Macdonald, RT  01/03/2024 10:45 AM

## 2024-01-11 ENCOUNTER — Ambulatory Visit (HOSPITAL_COMMUNITY)
Admission: RE | Admit: 2024-01-11 | Discharge: 2024-01-11 | Disposition: A | Discharge status: Home / Self Care | Source: Ambulatory Visit | Attending: Orthopaedic Surgery | Admitting: Orthopaedic Surgery

## 2024-01-13 NOTE — Progress Notes (Unsigned)
 Cardiology Office Note:  .   Date:  01/13/2024  ID:  Pasco KATHEE Sharps, DOB 1934-07-16, MRN [id number] PCP: Shayne Anes, MD  [facility] Providers Cardiologist:  Peter Swaziland, MD Electrophysiologist:  Elspeth Sage, MD {  History of Present Illness: .   Steven Macdonald is a 89 y.o. male w/PMHx of  DM, HTN, LBBB, P.embolism CAD(PCI of CTO/LAD 2023) HCM (w/o obstruction) AFlutter > ablated  CM (felt to be NICM, perhaps RV pacing) with improved LVEF SCAF noted via his PPM  He saw Dr. Sage last 01/19/23,  Normal device function, LV outputs to keep safety margin Discussed perhaps warfarin > OAC, though not changes Recurrent symptomatic Atach > if more, will try Multaq   He saw Dr. Swaziland most recently 06/25/23, no symptoms of angina, walking 4 miles/day Planned for an updated echo  LVEF ~45-50% (from 55-60% in 2023) Mild-mod AI   Today's visit is scheduled as an annual visit ROS:   He feels well With the summer and especially these very hot weeks, he is only walkin ~ a day (usually 4) No CP, palpitations or cardiac awareness of any kind No SOB No near syncope or syncope No bleeding or signs of bleeding   Device information MDT dual chamber PPM implanted 08/11/2004, gen change 2015 > upgrade > CRT-P  08/25/2020  Has had attempts at EKG Opt  Arrhythmia/AAD hx AFlutter (ablated remotely)  Reports of syncope in 2020 secondary to atrial arrhythmias Treated w/propafenone  > stopped 2/2 development of LV dysfunction  ATach has been described AFib noted via his PPM  Studies Reviewed: SABRA    EKG done today and reviewed by myself:  AV paced 62bpm, morphology looks OK,  DEVICE interrogation done today and reviewed by myself Battery and lead measurements are good One logged NSVT is a 1:1 tachycardia + fast AV/AT/AF At least 2 events are AFib, both very brief The rest are 1:1 tachy events Effective BP only 90%   07/26/23: TTE  1. Left ventricular ejection  fraction, by estimation, is 45 to 50%. The  left ventricle has mildly decreased function. The left ventricle has no  regional wall motion abnormalities. There is moderate asymmetric left  ventricular hypertrophy of the  basal-septal segment. Left ventricular diastolic parameters are consistent  with Grade I diastolic dysfunction (impaired relaxation).   2. Right ventricular systolic function is normal. The right ventricular  size is normal. There is normal pulmonary artery systolic pressure. The  estimated right ventricular systolic pressure is 21.5 mmHg.   3. The mitral valve is grossly normal. Mild mitral valve regurgitation.   4. The aortic valve is tricuspid. Aortic valve regurgitation is mild to  moderate.   5. Aneurysm of the ascending aorta, measuring 40 mm.   6. The inferior vena cava is normal in size with greater than 50%  respiratory variability, suggesting right atrial pressure of 3 mmHg.    09/08/21: TTE  1. Moderate hypertrophy of the basal septum with otherwise mild LVH.  Compared with the echo 04/2019, systolic function has improved. Left  ventricular ejection fraction, by estimation, is 55 to 60%. The left  ventricle has normal function. There is  moderate left ventricular hypertrophy of the basal-septal segment. Left  ventricular diastolic parameters are consistent with Grade I diastolic  dysfunction (impaired relaxation).   2. Right ventricular systolic function is normal. The right ventricular  size is normal.   3. The mitral valve is normal in structure. Trivial mitral valve  regurgitation. No  evidence of mitral stenosis.   4. The aortic valve is tricuspid. There is mild calcification of the  aortic valve. There is mild thickening of the aortic valve. Aortic valve  regurgitation is trivial.   5. Aortic dilatation noted. There is mild dilatation of the aortic root,  measuring 38 mm. There is mild dilatation of the ascending aorta,  measuring 38 mm.     Risk  Assessment/Calculations:    Physical Exam:   VS:  There were no vitals taken for this visit.   Wt Readings from Last 3 Encounters:  06/25/23 175 lb (79.4 kg)  03/13/23 179 lb 3.2 oz (81.3 kg)  01/19/23 175 lb (79.4 kg)    GEN: Well nourished, well developed in no acute distress NECK: No JVD; No carotid bruits CARDIAC: RRR, no murmurs, rubs, gallops RESPIRATORY:  CTA b/l without rales, wheezing or rhonchi  ABDOMEN: Soft, non-tender, non-distended EXTREMITIES: No edema; No deformity   PPM site (R side) : is stable, no thinning, fluctuation, tethering  ASSESSMENT AND PLAN: .    CRT-P intact function no programming changes made  ATach <1% burden He has had SCAFib, these are very brief Already on Eliquis  (hx of PE)  CAD No symptoms C/w Dr. Elston  NICM LVEF down a bit from last 45-50% Chronically appears BP% ~ 90% Observed PVCs and his ATach likely the culprit He feels quite well and is not inclined to want to make changes only rediucing his walking 2/2 the heat OptiVol looks great No symptoms or exam findings of volume OL C/w Dr. Elston   Discussed need for a new EP MD, Dr. Cindie is reading his remotes Discussed BP% and ideally would be higher EKG morphology by his last EKG looks OK PATs/PVCs > could increase his BB, though he is not inclined to want to make changes Will have him back in 3-4 mo to meet Dr. Cindie, revisit BP% and recs   Dispo: as above, sooner if needed  Signed, Charlies Macario Arthur, PA-C

## 2024-01-14 DIAGNOSIS — N401 Enlarged prostate with lower urinary tract symptoms: Secondary | ICD-10-CM | POA: Diagnosis not present

## 2024-01-14 DIAGNOSIS — R338 Other retention of urine: Secondary | ICD-10-CM | POA: Diagnosis not present

## 2024-01-14 DIAGNOSIS — Z7901 Long term (current) use of anticoagulants: Secondary | ICD-10-CM | POA: Diagnosis not present

## 2024-01-15 ENCOUNTER — Ambulatory Visit: Attending: Physician Assistant | Admitting: Physician Assistant

## 2024-01-15 ENCOUNTER — Encounter: Payer: Self-pay | Admitting: Physician Assistant

## 2024-01-15 VITALS — BP 112/60 | HR 62 | Ht 75.0 in | Wt 176.2 lb

## 2024-01-15 DIAGNOSIS — I4719 Other supraventricular tachycardia: Secondary | ICD-10-CM | POA: Diagnosis not present

## 2024-01-15 DIAGNOSIS — I428 Other cardiomyopathies: Secondary | ICD-10-CM | POA: Diagnosis not present

## 2024-01-15 DIAGNOSIS — I493 Ventricular premature depolarization: Secondary | ICD-10-CM | POA: Insufficient documentation

## 2024-01-15 DIAGNOSIS — Z950 Presence of cardiac pacemaker: Secondary | ICD-10-CM | POA: Insufficient documentation

## 2024-01-15 DIAGNOSIS — I480 Paroxysmal atrial fibrillation: Secondary | ICD-10-CM | POA: Diagnosis present

## 2024-01-15 LAB — CUP PACEART INCLINIC DEVICE CHECK
Battery Remaining Longevity: 34 mo
Battery Voltage: 2.93 V
Brady Statistic AP VP Percent: 85 %
Brady Statistic AP VS Percent: 0.66 %
Brady Statistic AS VP Percent: 10.42 %
Brady Statistic AS VS Percent: 3.93 %
Brady Statistic RA Percent Paced: 88.3 %
Brady Statistic RV Percent Paced: 95.41 %
Date Time Interrogation Session: 20250729132652
Implantable Lead Connection Status: 753985
Implantable Lead Connection Status: 753985
Implantable Lead Connection Status: 753985
Implantable Lead Implant Date: 20060223
Implantable Lead Implant Date: 20060223
Implantable Lead Implant Date: 20220309
Implantable Lead Location: 753858
Implantable Lead Location: 753859
Implantable Lead Location: 753860
Implantable Lead Model: 4598
Implantable Lead Model: 5076
Implantable Lead Model: 5076
Implantable Pulse Generator Implant Date: 20220309
Lead Channel Impedance Value: 1026 Ohm
Lead Channel Impedance Value: 1045 Ohm
Lead Channel Impedance Value: 1083 Ohm
Lead Channel Impedance Value: 399 Ohm
Lead Channel Impedance Value: 399 Ohm
Lead Channel Impedance Value: 437 Ohm
Lead Channel Impedance Value: 494 Ohm
Lead Channel Impedance Value: 513 Ohm
Lead Channel Impedance Value: 513 Ohm
Lead Channel Impedance Value: 570 Ohm
Lead Channel Impedance Value: 589 Ohm
Lead Channel Impedance Value: 665 Ohm
Lead Channel Impedance Value: 703 Ohm
Lead Channel Impedance Value: 760 Ohm
Lead Channel Pacing Threshold Amplitude: 0.625 V
Lead Channel Pacing Threshold Amplitude: 0.625 V
Lead Channel Pacing Threshold Amplitude: 2.125 V
Lead Channel Pacing Threshold Pulse Width: 0.4 ms
Lead Channel Pacing Threshold Pulse Width: 0.4 ms
Lead Channel Pacing Threshold Pulse Width: 0.8 ms
Lead Channel Sensing Intrinsic Amplitude: 2.25 mV
Lead Channel Sensing Intrinsic Amplitude: 3.5 mV
Lead Channel Sensing Intrinsic Amplitude: 7.375 mV
Lead Channel Sensing Intrinsic Amplitude: 8 mV
Lead Channel Setting Pacing Amplitude: 1.5 V
Lead Channel Setting Pacing Amplitude: 2.5 V
Lead Channel Setting Pacing Amplitude: 3.25 V
Lead Channel Setting Pacing Pulse Width: 0.4 ms
Lead Channel Setting Pacing Pulse Width: 0.8 ms
Lead Channel Setting Sensing Sensitivity: 1.2 mV
Zone Setting Status: 755011

## 2024-01-15 NOTE — Patient Instructions (Addendum)
 Medication Instructions:  Your physician recommends that you continue on your current medications as directed. Please refer to the Current Medication list given to you today.  *If you need a refill on your cardiac medications before your next appointment, please call your pharmacy*  Follow-Up: At [facility], you and your health needs are our priority.  As part of our continuing mission to provide you with exceptional heart care, our providers are all part of one team.  This team includes your primary Cardiologist (physician) and Advanced Practice Providers or APPs (Physician Assistants and Nurse Practitioners) who all work together to provide you with the care you need, when you need it.  Your next appointment:   2-4 month(s)  Provider:   Ole Holts, MD   We recommend signing up for the patient portal called MyChart.  Sign up information is provided on this After Visit Summary.  MyChart is used to connect with patients for Virtual Visits (Telemedicine).  Patients are able to view lab/test results, encounter notes, upcoming appointments, etc.  Non-urgent messages can be sent to your provider as well.   To learn more about what you can do with MyChart, go to [email or web address].

## 2024-01-16 NOTE — Addendum Note (Signed)
 Addended by: TAWNI DRILLING D on: 01/16/2024 10:24 AM   Modules accepted: Orders

## 2024-01-16 NOTE — Progress Notes (Signed)
 Remote pacemaker transmission.

## 2024-01-21 ENCOUNTER — Ambulatory Visit: Payer: Self-pay | Admitting: Cardiology

## 2024-01-24 ENCOUNTER — Encounter: Payer: Self-pay | Admitting: Orthopaedic Surgery

## 2024-01-24 ENCOUNTER — Ambulatory Visit (INDEPENDENT_AMBULATORY_CARE_PROVIDER_SITE_OTHER): Admitting: Orthopaedic Surgery

## 2024-01-24 DIAGNOSIS — M545 Low back pain, unspecified: Secondary | ICD-10-CM

## 2024-01-24 NOTE — Progress Notes (Signed)
 Cardiology Office Note   Date:  01/29/2024   ID:  Steven Macdonald, DOB Oct 18, 1934, MRN [id number]  PCP:  Shayne Anes, MD  Cardiologist:   Rithy Mandley Swaziland, MD   Chief Complaint  Patient presents with   Coronary Artery Disease      History of Present Illness: Steven Macdonald is a 89 y.o. male who is seen for follow up CAD.  He has a history of atrial flutter s/p ablation, cardiomyopathy. chronic diastolic CHF, DM, hyperlipidemia, HTN, LBBB, orthostatic hypotension, sinus node dysfunction s/p pacemaker placement, CAD and PE on lifelong coumadin  therapy. He is followed in our office by Dr. Fernande. He is known to have CAD with coronary CTA in June 2019 showing occlusion of the proximal LAD. Minimal disease in the RCA and Circumflex. Echo August 2022 with LVEF=30-35% with global hypokinesis, mild AI. He has been on a beta blocker and Entresto . He is on coumadin  therapy given history of PE and atrial flutter. He was seen in Feb 2023 by Dr Verlin as a work in for evaluation of chest pain.  Bisoprolol  was stopped and Imdur  15 mg daily was started.  He was scheduled for cardiac cath with bridging Lovenox . Cardiac cath was performed by Dr Anner on 08/02/21. This showed a CTO of the proximal LAD with collaterals. Ranexa  was added for therapy and Plavix .   He presented on 09/07/2021 for planned CTO PCI of LAD.  PCI of the LAD was successful and DES was placed using IVUS guidance. He tolerated the procedure well but was noted to be hypotensive following procedure requiring fluids. Hypotension persisted throughout the night. Bedside Echo showed no evidence of pericardial effusion. He was noted to have almost a 4 g/dL drop in her hemoglobin from 14.4 to 10.5. He was asymptomatic with this though. Abdominal/pelvic CT was ordered and revealed a mild left-sided retroperitoneal hemorrhage, and mild hemorrhage within the left lower quadrant abdominal wall soft tissue. Formal Echo on 3/23 showed normal LV function  with no mention of pericardial effusion. Home Coumadin  was held. Hemoglobin stable at 10.3 on day of discharge. Initial plan was for triple therapy with Aspirin , Plavix , and Coumadin  for 1 month at which time Aspirin  can be stopped. However, given retroperitoneal bleed, will hold Coumadin  for 1 week. Given PCI to LAD, will have to continue Aspirin  and Plavix .  Imdur  and Ranexa  were discontinued. Hgb improved to 11.0 on outpatient follow up. Entresto  also discontinued due to hypotension.  He continues to do well. No angina. Still walking daily. No bleeding.  No dizziness or palpitations. Energy level is good.     Past Medical History:  Diagnosis Date   Arthritis    Atrial flutter (HCC)    RFCA    BPH (benign prostatic hypertrophy)    Cardiomyopathy, nonischemic -resolved    Echo 2008: EF 25% Cardiac CT 2009: Normal cors EF 31% Echo 3/10 EF 45% Echo 6/13: EF 50-55%     CHF (congestive heart failure) (HCC)    Chicken pox    Colon polyps    hyperplastic   Diabetes mellitus (HCC)    ED (erectile dysfunction)    Gilbert's syndrome    possible   Hemorrhoids    Herniated cervical disc    HLD (hyperlipidemia)    HTN (hypertension)    LBBB (left bundle branch block)    Orthostatic hypotension    Pacemaker  Medtronic    DOI 2/06   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HCC)    Pulmonary embolism (HCC)  Sinus node dysfunction [facility])         Past Surgical History:  Procedure Laterality Date   BIV UPGRADE N/A 08/25/2020   Procedure: BIV PPM UPGRADE;  Surgeon: Fernande Elspeth BROCKS, MD;  Location: [facility] INVASIVE CV LAB;  Service: Cardiovascular;  Laterality: N/A;   CARDIOVERSION     COLONOSCOPY WITH PROPOFOL  N/A 01/20/2021   Procedure: COLONOSCOPY WITH PROPOFOL ;  Surgeon: Abran Norleen SAILOR, MD;  Location: [facility] ENDOSCOPY;  Service: Endoscopy;  Laterality: N/A;   coronary ablation     CORONARY CTO INTERVENTION N/A 09/07/2021   Procedure: CORONARY CTO INTERVENTION;  Surgeon: Swaziland, Ajay Strubel M, MD;  Location: [facility] INVASIVE CV  LAB;  Service: Cardiovascular;  Laterality: N/A;   CORONARY ULTRASOUND/IVUS N/A 09/07/2021   Procedure: Intravascular Ultrasound/IVUS;  Surgeon: Swaziland, Mirha Brucato M, MD;  Location: [facility] INVASIVE CV LAB;  Service: Cardiovascular;  Laterality: N/A;   CYSTOSCOPY W/ TRANSURETHRAL RESECTION OF POSTERIOR URETHERAL VALVES     LEFT HEART CATH AND CORONARY ANGIOGRAPHY N/A 08/02/2021   Procedure: LEFT HEART CATH AND CORONARY ANGIOGRAPHY;  Surgeon: Anner Alm ORN, MD;  Location: [facility] INVASIVE CV LAB;  Service: Cardiovascular;  Laterality: N/A;   PACEMAKER GENERATOR CHANGE N/A 05/13/2014   Procedure: PACEMAKER GENERATOR CHANGE;  Surgeon: Elspeth BROCKS Fernande, MD;  Location: [facility] CATH LAB;  Service: Cardiovascular;  Laterality: N/A;   PACEMAKER INSERTION     POLYPECTOMY  01/20/2021   Procedure: POLYPECTOMY;  Surgeon: Abran Norleen SAILOR, MD;  Location: [facility] ENDOSCOPY;  Service: Endoscopy;;     Current Outpatient Medications  Medication Sig Dispense Refill   apixaban  (ELIQUIS ) 5 MG TABS tablet Take 1 tablet (5 mg total) by mouth 2 (two) times daily. 180 tablet 3   cholecalciferol  (VITAMIN D ) 1000 UNITS tablet Take 1,000 Units by mouth daily.     Cyanocobalamin  (VITAMIN B-12) 1000 MCG SUBL Place 1,000 mcg under the tongue daily.     docusate sodium  (COLACE) 100 MG capsule Take 100 mg by mouth 2 (two) times daily.     finasteride  (PROSCAR ) 5 MG tablet Take 5 mg by mouth every evening.     metoprolol  succinate (TOPROL -XL) 25 MG 24 hr tablet Take by mouth daily.     mirtazapine  (REMERON ) 15 MG tablet Take 15 mg by mouth at bedtime.     polyethylene glycol (MIRALAX  / GLYCOLAX ) 17 g packet Take 17 g by mouth daily.     Probiotic Product (ALIGN PO) Take 4 mg by mouth daily.     PSYLLIUM PO Take 10 mLs by mouth daily. Citrucel powder     rosuvastatin  (CRESTOR ) 20 MG tablet Take 20 mg by mouth daily.     tamsulosin  (FLOMAX ) 0.4 MG CAPS capsule Take 0.4 mg by mouth every morning. Monday, Tuesday, Friday     No current  facility-administered medications for this visit.    Allergies:   Ezetimibe    Social History:  The patient  reports that he quit smoking about 53 years ago. His smoking use included cigarettes. He has never used smokeless tobacco. He reports current alcohol  use. He reports that he does not use drugs.   Family History:  The patient's family history includes Bladder Cancer in his father; Breast cancer in his mother; Colon polyps in his father.    ROS:  Please see the history of present illness.   Otherwise, review of systems are positive for none.   All other systems are reviewed and negative.    PHYSICAL EXAM: VS:  BP 130/82 (BP Location: Right  Arm, Patient Position: Sitting, Cuff Size: Normal)   Pulse 64   Ht 6' 2 (1.88 m)   Wt 176 lb (79.8 kg)   SpO2 95%   BMI 22.60 kg/m  , BMI Body mass index is 22.6 kg/m. GEN: Well nourished, well developed, in no acute distress HEENT: normal Neck: no JVD, carotid bruits, or masses Cardiac: RRR; no murmurs, rubs, or gallops,no edema  Respiratory:  clear to auscultation bilaterally, normal work of breathing GI: soft, nontender, nondistended, + BS MS: no deformity or atrophy Skin: warm and dry, no rash Neuro:  Strength and sensation are intact Psych: euthymic mood, full affect         Recent Labs: 03/13/2023: ALT 15; BUN 18; Creatinine 1.43; Hemoglobin 14.5; Platelet Count 108; Potassium 4.1; Sodium 140    Lipid Panel    Component Value Date/Time   CHOL 130 08/31/2021 1120   TRIG 81 08/31/2021 1120   HDL 54 08/31/2021 1120   CHOLHDL 2.4 08/31/2021 1120   LDLCALC 60 08/31/2021 1120     Dated 10/22/20: cholesterol 135, triglycerides 63, HDL 53, LDL 69.  Dated 05/04/21: A1c 5.5%. LFTs normal. Dated 12/05/21: cholesterol 119, triglycerides 63, HDL 39, LDL 67. CMET and CBC normal. A1c 5.6%. normal TSH. Dated 02/01/23: cholesterol 127, triglycerides 83, HDL 50, LDL 60.   Wt Readings from Last 3 Encounters:  01/29/24 176 lb (79.8 kg)   01/15/24 176 lb 3.2 oz (79.9 kg)  06/25/23 175 lb (79.4 kg)      Other studies Reviewed: Additional studies/ records that were reviewed today include:   Coronary CTA 10/24/17: ADDENDUM REPORT: 10/24/2017 17:01   CLINICAL DATA:  Chest pain   EXAM: Cardiac CTA   MEDICATIONS: Sub lingual nitro.  4mg  x 2 and lopressor  5mg  IV   TECHNIQUE: The patient was scanned on a Siemens 192 slice scanner. Gantry rotation speed was 250 msecs. Collimation was 0.6 mm. A 100 kV prospective scan was triggered in the ascending thoracic aorta at 35-75% of the R-R interval. Average HR during the scan was 60 bpm. The 3D data set was interpreted on a dedicated work station using MPR, MIP and VRT modes. A total of 80cc of contrast was used.   FINDINGS: Non-cardiac: See separate report from [facility].   Pacemaker lead in right heart.   Calcium  Score: 20 Agatston units.   Coronary Arteries: Right dominant with no anomalies   LM: No plaque or stenosis.   LAD system: The proximal LAD appears to be subtotally occluded.   Circumflex system: Small-moderate ramus, no significant disease. No plaque or stenosis in LCx system.   RCA system: Mixed plaque proximal RCA, no significant stenosis.   IMPRESSION: 1. Coronary artery calcium  score 20 Agatston units. This places the patient in the 8th percentile for age/gender, suggesting low risk for future cardiac events.   2. The proximal LAD appears subtotally occluded. Will confirm by FFR.   Dalton Mclean     Electronically Signed   By: Ezra Shuck M.D.   On: 10/24/2017 17:01  CLINICAL DATA:  Chest pain   EXAM: CT FFR   MEDICATIONS: No additional medications.   TECHNIQUE: The coronary CTA was sent for CT FFR.   FINDINGS: FFR 0.89 distal RCA FFR 0.86 distal LCx   The proximal LAD appears occluded. There is collateral filling distally.   IMPRESSION: Occluded proximal LAD.   Dalton Mclean     Electronically Signed    By: Ezra Shuck M.D.   On: 10/25/2017  14:32    Echo 02/09/21: IMPRESSIONS     1. Moderate hypertrophy of the basal septum with otherwise mild  concentric LVH. Global hypokinesis worse in the septum. Compared with the  echo 06/2020, systolic function has improved. Left ventricular ejection  fraction, by estimation, is 30 to 35%. The  left ventricle has moderately decreased function. The left ventricle  demonstrates global hypokinesis. There is moderate left ventricular  hypertrophy. Left ventricular diastolic parameters are consistent with  Grade I diastolic dysfunction (impaired  relaxation).   2. Right ventricular systolic function is normal. The right ventricular  size is normal.   3. The mitral valve is normal in structure. Trivial mitral valve  regurgitation. No evidence of mitral stenosis.   4. The aortic valve is normal in structure. Aortic valve regurgitation is  mild. No aortic stenosis is present.   5. Aortic dilatation noted. There is mild dilatation of the aortic root,  measuring 39 mm. There is mild dilatation of the ascending aorta,  measuring 42 mm.   6. The inferior vena cava is normal in size with greater than 50%  respiratory variability, suggesting right atrial pressure of 3 mmHg.   LEFT HEART CATH AND CORONARY ANGIOGRAPHY   Conclusion      Mid LAD lesion is 100% stenosed. ->  Fills via multiple septal perforator and distal PDA collaterals all the way to the occlusion site.  Favorable for CTO PCI.   LV end diastolic pressure is low.   Very tortuous right brachial artery, required versa core wire catheter steering to manipulate   Summary Severe single-vessel CAD with CTO of the proximal to mid LAD with collateral flow filling the majority of the LAD from the RPDA septal perforators. Otherwise angiographically normal coronary arteries with a codominant system.     Recommendations: We will add Plavix  after loading 300 mg prior to discharge Also start Ranexa   500 g twice daily He will be discharged home today and plan for staged PCI (CTO PCI) with Dr. Swaziland on March 22     Alm Clay, MD   Diagnostic Dominance: Co-dominant Interve Conclusion      Mid LAD lesion is 100% stenosed.   A drug-eluting stent was successfully placed using a SYNERGY XD 3.0X48.   Post intervention, there is a 0% residual stenosis.   Successful CTO PCI of the LAD with IVUS guidance and DES x 1     Plan: observe overnight. Plan to resume coumadin  tonight without bridging. ASA 81 mg x 1 month. Plavix  75 mg daily for at least 6 months.    Intervention  Intervention    Echo 09/08/21: IMPRESSIONS     1. Moderate hypertrophy of the basal septum with otherwise mild LVH.  Compared with the echo 04/2019, systolic function has improved. Left  ventricular ejection fraction, by estimation, is 55 to 60%. The left  ventricle has normal function. There is  moderate left ventricular hypertrophy of the basal-septal segment. Left  ventricular diastolic parameters are consistent with Grade I diastolic  dysfunction (impaired relaxation).   2. Right ventricular systolic function is normal. The right ventricular  size is normal.   3. The mitral valve is normal in structure. Trivial mitral valve  regurgitation. No evidence of mitral stenosis.   4. The aortic valve is tricuspid. There is mild calcification of the  aortic valve. There is mild thickening of the aortic valve. Aortic valve  regurgitation is trivial.   5. Aortic dilatation noted. There is mild dilatation of the  aortic root,  measuring 38 mm. There is mild dilatation of the ascending aorta,  measuring 38 mm.   Echo 07/26/23: IMPRESSIONS     1. Left ventricular ejection fraction, by estimation, is 45 to 50%. The  left ventricle has mildly decreased function. The left ventricle has no  regional wall motion abnormalities. There is moderate asymmetric left  ventricular hypertrophy of the  basal-septal  segment. Left ventricular diastolic parameters are consistent  with Grade I diastolic dysfunction (impaired relaxation).   2. Right ventricular systolic function is normal. The right ventricular  size is normal. There is normal pulmonary artery systolic pressure. The  estimated right ventricular systolic pressure is 21.5 mmHg.   3. The mitral valve is grossly normal. Mild mitral valve regurgitation.   4. The aortic valve is tricuspid. Aortic valve regurgitation is mild to  moderate.   5. Aneurysm of the ascending aorta, measuring 40 mm.   6. The inferior vena cava is normal in size with greater than 50%  respiratory variability, suggesting right atrial pressure of 3 mmHg.    ASSESSMENT AND PLAN:  1.  CAD Now s/p successful CTO PCI with DES in March 2023. LV function with improvement post PCI. Anginal symptoms resolved. Continue Eliquis  2. Chronic systolic CHF with ischemic CM. EF substantially improved following PCI. Last EF 45-50%. Some of LV dysfunction may be related to paced rhythm. Now only on low dose Toprol .   3. History of PE on chronic Eliquis  4. Sinus node dysfunction s/p PPM/BIV ICD. Stable device function. Follow up with Dr Cindie 5. Hyperlipidemia. On statin. Goal LDL < 70. Last LDL 60. Due for follow up with Dr Shayne in November 6. HCM. Patient does have chronic basal septal hypertrophy. No outflow gradient. Mild LV dysfunction. Class 1-2    Disposition:   FU with me 6 months    Signed, Ellanora Rayborn Swaziland, MD  01/29/2024 9:59 AM    [facility] [address], [place], [place], [address] Phone [phone], Fax [phone]

## 2024-01-24 NOTE — Progress Notes (Signed)
 The patient is a 89 year old gentleman who comes in today for follow-up as a relates to acute left-sided low back pain with some radicular symptoms.  He is 89 years old.  We were going to obtain an MRI of his lumbar spine but given his pacemaker he really did not want an MRI and actually the 5 to 6 days of prednisone  we put him on as well as topical Voltaren gel as helped quite a bit.  He cannot take traditional anti-inflammatories due to being on blood thinning medication.  Overall though he says he feels much better.  On exam he is walking much more upright and looks better overall.  He does not have a positive straight leg raise and is much more comfortable.  We even talked about the possibility of physical therapy but right now he is doing well enough that we will hold off on therapy or any other treatment modalities.  I did let him know that if he gets into September or October he would like to have another steroid run or taper he will let us  know.  He can continue the Voltaren gel since he is using just a small amount twice daily and we can always order physical therapy if he starts to have a different direction.  If he really got significantly worse we will order a CT myelogram of the lumbar spine.  All questions and concerns were answered and addressed.

## 2024-01-29 ENCOUNTER — Encounter: Payer: Self-pay | Admitting: Cardiology

## 2024-01-29 ENCOUNTER — Ambulatory Visit: Attending: Cardiology | Admitting: Cardiology

## 2024-01-29 VITALS — BP 130/82 | HR 64 | Ht 74.0 in | Wt 176.0 lb

## 2024-01-29 DIAGNOSIS — I422 Other hypertrophic cardiomyopathy: Secondary | ICD-10-CM | POA: Diagnosis not present

## 2024-01-29 DIAGNOSIS — I25110 Atherosclerotic heart disease of native coronary artery with unstable angina pectoris: Secondary | ICD-10-CM | POA: Insufficient documentation

## 2024-01-29 DIAGNOSIS — I428 Other cardiomyopathies: Secondary | ICD-10-CM | POA: Insufficient documentation

## 2024-01-29 DIAGNOSIS — I447 Left bundle-branch block, unspecified: Secondary | ICD-10-CM | POA: Insufficient documentation

## 2024-01-29 DIAGNOSIS — I5022 Chronic systolic (congestive) heart failure: Secondary | ICD-10-CM | POA: Diagnosis not present

## 2024-01-29 DIAGNOSIS — I495 Sick sinus syndrome: Secondary | ICD-10-CM | POA: Insufficient documentation

## 2024-01-29 DIAGNOSIS — Z95811 Presence of heart assist device: Secondary | ICD-10-CM | POA: Insufficient documentation

## 2024-01-29 NOTE — Patient Instructions (Signed)
 Medication Instructions:  Continue same medications *If you need a refill on your cardiac medications before your next appointment, please call your pharmacy*  Lab Work: None ordered  Testing/Procedures: None ordered  Follow-Up: At [facility], you and your health needs are our priority.  As part of our continuing mission to provide you with exceptional heart care, our providers are all part of one team.  This team includes your primary Cardiologist (physician) and Advanced Practice Providers or APPs (Physician Assistants and Nurse Practitioners) who all work together to provide you with the care you need, when you need it.  Your next appointment:  6 months   Call in Oct to schedule Feb appointment     Provider:  Dr.Jordan   We recommend signing up for the patient portal called MyChart.  Sign up information is provided on this After Visit Summary.  MyChart is used to connect with patients for Virtual Visits (Telemedicine).  Patients are able to view lab/test results, encounter notes, upcoming appointments, etc.  Non-urgent messages can be sent to your provider as well.   To learn more about what you can do with MyChart, go to [email or web address].

## 2024-02-06 DIAGNOSIS — L821 Other seborrheic keratosis: Secondary | ICD-10-CM | POA: Diagnosis not present

## 2024-02-06 DIAGNOSIS — L858 Other specified epidermal thickening: Secondary | ICD-10-CM | POA: Diagnosis not present

## 2024-02-06 DIAGNOSIS — Z85828 Personal history of other malignant neoplasm of skin: Secondary | ICD-10-CM | POA: Diagnosis not present

## 2024-03-10 ENCOUNTER — Ambulatory Visit (INDEPENDENT_AMBULATORY_CARE_PROVIDER_SITE_OTHER)

## 2024-03-10 DIAGNOSIS — I428 Other cardiomyopathies: Secondary | ICD-10-CM

## 2024-03-11 LAB — CUP PACEART REMOTE DEVICE CHECK
Battery Remaining Longevity: 28 mo
Battery Voltage: 2.92 V
Brady Statistic AP VP Percent: 84.65 %
Brady Statistic AP VS Percent: 0.61 %
Brady Statistic AS VP Percent: 11.75 %
Brady Statistic AS VS Percent: 2.99 %
Brady Statistic RA Percent Paced: 87.55 %
Brady Statistic RV Percent Paced: 96.39 %
Date Time Interrogation Session: 20250922000056
Implantable Lead Connection Status: 753985
Implantable Lead Connection Status: 753985
Implantable Lead Connection Status: 753985
Implantable Lead Implant Date: 20060223
Implantable Lead Implant Date: 20060223
Implantable Lead Implant Date: 20220309
Implantable Lead Location: 753858
Implantable Lead Location: 753859
Implantable Lead Location: 753860
Implantable Lead Model: 4598
Implantable Lead Model: 5076
Implantable Lead Model: 5076
Implantable Pulse Generator Implant Date: 20220309
Lead Channel Impedance Value: 1045 Ohm
Lead Channel Impedance Value: 1102 Ohm
Lead Channel Impedance Value: 1121 Ohm
Lead Channel Impedance Value: 342 Ohm
Lead Channel Impedance Value: 399 Ohm
Lead Channel Impedance Value: 399 Ohm
Lead Channel Impedance Value: 456 Ohm
Lead Channel Impedance Value: 475 Ohm
Lead Channel Impedance Value: 494 Ohm
Lead Channel Impedance Value: 494 Ohm
Lead Channel Impedance Value: 551 Ohm
Lead Channel Impedance Value: 665 Ohm
Lead Channel Impedance Value: 703 Ohm
Lead Channel Impedance Value: 798 Ohm
Lead Channel Pacing Threshold Amplitude: 0.625 V
Lead Channel Pacing Threshold Amplitude: 0.625 V
Lead Channel Pacing Threshold Amplitude: 2.5 V
Lead Channel Pacing Threshold Pulse Width: 0.4 ms
Lead Channel Pacing Threshold Pulse Width: 0.4 ms
Lead Channel Pacing Threshold Pulse Width: 0.8 ms
Lead Channel Sensing Intrinsic Amplitude: 2.25 mV
Lead Channel Sensing Intrinsic Amplitude: 2.25 mV
Lead Channel Sensing Intrinsic Amplitude: 8.875 mV
Lead Channel Sensing Intrinsic Amplitude: 8.875 mV
Lead Channel Setting Pacing Amplitude: 1.5 V
Lead Channel Setting Pacing Amplitude: 2.5 V
Lead Channel Setting Pacing Amplitude: 3.25 V
Lead Channel Setting Pacing Pulse Width: 0.4 ms
Lead Channel Setting Pacing Pulse Width: 0.8 ms
Lead Channel Setting Sensing Sensitivity: 1.2 mV
Zone Setting Status: 755011

## 2024-03-12 ENCOUNTER — Ambulatory Visit: Payer: Self-pay | Admitting: Cardiology

## 2024-03-12 NOTE — Progress Notes (Signed)
 Remote PPM Transmission

## 2024-03-25 NOTE — Progress Notes (Unsigned)
  Electrophysiology Office Follow up Visit Note:    Date:  03/26/2024   ID:  Steven Macdonald, DOB Nov 06, 1934, MRN [id number]  PCP:  Shayne Anes, MD  [facility] Cardiologist:  Peter Swaziland, MD  [facility] Electrophysiologist:  OLE ONEIDA HOLTS, MD    Interval History:     Steven Macdonald is a 89 y.o. male who presents for a follow up visit.   The patient was last seen by [facility] January 15, 2024.  The patient was previously followed by Dr. Fernande.  The patient has a CRT-P in situ.  Also with a history of atrial tachycardia on Eliquis  given history of pulmonary embolism.  He is doing well.  No problems with his device.  No syncope or presyncope.  No signs of heart failure.      Past medical, surgical, social and family history were reviewed.  ROS:   Please see the history of present illness.    All other systems reviewed and are negative.  EKGs/Labs/Other Studies Reviewed:    The following studies were reviewed today:  March 26, 2024 in-clinic device interrogation personally reviewed Battery longevity just over 2 years BiV pacing 92.4% Presenting rhythm with rare PVCs        Physical Exam:    VS:  BP 118/72 (BP Location: Right Arm, Cuff Size: Normal)   Pulse 62   Ht 6' 3 (1.905 m)   Wt 176 lb 9.6 oz (80.1 kg)   BMI 22.07 kg/m     Wt Readings from Last 3 Encounters:  03/26/24 176 lb 9.6 oz (80.1 kg)  01/29/24 176 lb (79.8 kg)  01/15/24 176 lb 3.2 oz (79.9 kg)     GEN: no distress CARD: RRR, No MRG.  Generator pocket well-healed RESP: No IWOB. CTAB.      ASSESSMENT:    1. NICM (nonischemic cardiomyopathy) (HCC)   2. Cardiac resynchronization therapy pacemaker (CRT-P) in place   3. Atrial tachycardia    PLAN:    In order of problems listed above:  #Chronic systolic heart failure #Nonischemic cardiomyopathy #CRT-P in situ NYHA class II.  Warm and dry on exam.  Continue metoprolol . BiV pacing percentage 92  #Atrial tachycardia #History of  pulmonary embolism Continue Eliquis   I did discuss my upcoming transition away from the practice.  We will get him transitioned over to one of my partners.  Follow-up 1 year with APP  Signed, OLE HOLTS, MD, [facility], [facility] 03/26/2024 2:12 PM    Electrophysiology [facility]

## 2024-03-26 ENCOUNTER — Ambulatory Visit: Attending: Cardiology | Admitting: Cardiology

## 2024-03-26 VITALS — BP 118/72 | HR 62 | Ht 75.0 in | Wt 176.6 lb

## 2024-03-26 DIAGNOSIS — I4719 Other supraventricular tachycardia: Secondary | ICD-10-CM | POA: Diagnosis not present

## 2024-03-26 DIAGNOSIS — I428 Other cardiomyopathies: Secondary | ICD-10-CM | POA: Insufficient documentation

## 2024-03-26 DIAGNOSIS — Z950 Presence of cardiac pacemaker: Secondary | ICD-10-CM | POA: Diagnosis not present

## 2024-03-26 LAB — CUP PACEART INCLINIC DEVICE CHECK
Date Time Interrogation Session: 20251008192100
Implantable Lead Connection Status: 753985
Implantable Lead Connection Status: 753985
Implantable Lead Connection Status: 753985
Implantable Lead Implant Date: 20060223
Implantable Lead Implant Date: 20060223
Implantable Lead Implant Date: 20220309
Implantable Lead Location: 753858
Implantable Lead Location: 753859
Implantable Lead Location: 753860
Implantable Lead Model: 4598
Implantable Lead Model: 5076
Implantable Lead Model: 5076
Implantable Pulse Generator Implant Date: 20220309

## 2024-03-26 NOTE — Patient Instructions (Signed)
 Medication Instructions:  Your physician recommends that you continue on your current medications as directed. Please refer to the Current Medication list given to you today.  *If you need a refill on your cardiac medications before your next appointment, please call your pharmacy*   Follow-Up: At [facility], you and your health needs are our priority.  As part of our continuing mission to provide you with exceptional heart care, our providers are all part of one team.  This team includes your primary Cardiologist (physician) and Advanced Practice Providers or APPs (Physician Assistants and Nurse Practitioners) who all work together to provide you with the care you need, when you need it.  Your next appointment:   1 year(s)  Provider:   You will see one of the following Advanced Practice Providers on your designated Care Team:   Charlies Arthur, PA-C Michael Andy Tillery, PA-C Suzann Riddle, NP Daphne Barrack, NP Artist Pouch, PA-C    Thank you for choosing [facility]!!   [phone]

## 2024-03-31 ENCOUNTER — Ambulatory Visit: Payer: Self-pay | Admitting: Cardiology

## 2024-04-05 DIAGNOSIS — Z23 Encounter for immunization: Secondary | ICD-10-CM | POA: Diagnosis not present

## 2024-04-21 ENCOUNTER — Encounter: Payer: Self-pay | Admitting: Radiology

## 2024-04-23 DIAGNOSIS — Z23 Encounter for immunization: Secondary | ICD-10-CM | POA: Diagnosis not present

## 2024-04-24 DIAGNOSIS — R5383 Other fatigue: Secondary | ICD-10-CM | POA: Diagnosis not present

## 2024-04-24 DIAGNOSIS — Z1152 Encounter for screening for COVID-19: Secondary | ICD-10-CM | POA: Diagnosis not present

## 2024-04-24 DIAGNOSIS — R42 Dizziness and giddiness: Secondary | ICD-10-CM | POA: Diagnosis not present

## 2024-04-24 DIAGNOSIS — R000 Tachycardia, unspecified: Secondary | ICD-10-CM | POA: Diagnosis not present

## 2024-04-30 DIAGNOSIS — E785 Hyperlipidemia, unspecified: Secondary | ICD-10-CM | POA: Diagnosis not present

## 2024-04-30 DIAGNOSIS — E291 Testicular hypofunction: Secondary | ICD-10-CM | POA: Diagnosis not present

## 2024-05-12 ENCOUNTER — Ambulatory Visit: Admitting: Physician Assistant

## 2024-05-12 ENCOUNTER — Encounter: Payer: Self-pay | Admitting: Physician Assistant

## 2024-05-12 DIAGNOSIS — M7021 Olecranon bursitis, right elbow: Secondary | ICD-10-CM | POA: Diagnosis not present

## 2024-05-12 DIAGNOSIS — M25521 Pain in right elbow: Secondary | ICD-10-CM

## 2024-05-12 MED ORDER — LIDOCAINE HCL 1 % IJ SOLN
0.5000 mL | INTRAMUSCULAR | Status: AC | PRN
  Administered 2024-05-12: .5 mL

## 2024-05-12 MED ORDER — LIDOCAINE HCL 2 % IJ SOLN
5.0000 mL | INTRAMUSCULAR | Status: AC | PRN
  Administered 2024-05-12: 5 mL

## 2024-05-12 NOTE — Progress Notes (Signed)
   Procedure Note  Patient: Steven Macdonald             Date of Birth: 1934/11/03           MRN: [id number]             Visit Date: 05/12/2024 HPI: Mr. Barro comes in today for right elbow swelling for the last 2 to 3 weeks.  History of olecranon bursitis in the past in 2022.  Has had no injury to the right elbow.  He is having no pain.  Denies any fevers chills.  He is nondiabetic.  Review of systems see HPI otherwise negative  Physical exam: General well-developed well-nourished male no acute distress.  Affect appropriate.  Ambulates without any assistive device. Respirations: Nonlabored Right elbow obvious olecranon bursitis.  No significant warmth slight erythema.  No drainage.  Full range of motion of the right elbow without pain.  Full supination pronation right forearm.  Procedures: Visit Diagnoses:  1. Olecranon bursitis, right elbow     Medium Joint Inj: R olecranon bursa on 05/12/2024 3:11 PM Medications: 0.5 mL lidocaine  1 %; 5 mL lidocaine  2 % Aspirate: 30 mL yellow and blood-tinged Consent was given by the patient. Immediately prior to procedure a time out was called to verify the correct patient, procedure, equipment, support staff and site/side marked as required. Patient was prepped and draped in the usual sterile fashion.    Plan: Will see him back in 2 weeks to see how he is doing overall.  Sooner if there is any questions concerns.  Elbow was wrapped with an Ace bandage today he will leave this on the till tomorrow morning and remove it.  Questions were encouraged and answered.

## 2024-05-13 DIAGNOSIS — R82998 Other abnormal findings in urine: Secondary | ICD-10-CM | POA: Diagnosis not present

## 2024-05-29 ENCOUNTER — Ambulatory Visit: Admitting: Physician Assistant

## 2024-05-29 ENCOUNTER — Encounter: Payer: Self-pay | Admitting: Physician Assistant

## 2024-05-29 DIAGNOSIS — M7021 Olecranon bursitis, right elbow: Secondary | ICD-10-CM

## 2024-05-29 NOTE — Progress Notes (Signed)
 HPI: Steven Macdonald returns today 2 weeks status post right olecranon bursa aspiration.  He is overall doing well.  He has no tenderness.  No recurrence.  He has good range of motion of the elbow without pain.  Right elbow: No abnormal warmth erythema or effusion.  Right olecranon bursitis  Plan: He will follow-up as needed.  No charge for today's office visit.

## 2024-06-09 ENCOUNTER — Ambulatory Visit

## 2024-06-09 DIAGNOSIS — I428 Other cardiomyopathies: Secondary | ICD-10-CM

## 2024-06-10 LAB — CUP PACEART REMOTE DEVICE CHECK
Battery Remaining Longevity: 23 mo
Battery Voltage: 2.91 V
Brady Statistic AP VP Percent: 80.71 %
Brady Statistic AP VS Percent: 0.58 %
Brady Statistic AS VP Percent: 15.78 %
Brady Statistic AS VS Percent: 2.93 %
Brady Statistic RA Percent Paced: 83.54 %
Brady Statistic RV Percent Paced: 96.48 %
Date Time Interrogation Session: 20251222050357
Implantable Lead Connection Status: 753985
Implantable Lead Connection Status: 753985
Implantable Lead Connection Status: 753985
Implantable Lead Implant Date: 20060223
Implantable Lead Implant Date: 20060223
Implantable Lead Implant Date: 20220309
Implantable Lead Location: 753858
Implantable Lead Location: 753859
Implantable Lead Location: 753860
Implantable Lead Model: 4598
Implantable Lead Model: 5076
Implantable Lead Model: 5076
Implantable Pulse Generator Implant Date: 20220309
Lead Channel Impedance Value: 1083 Ohm
Lead Channel Impedance Value: 1121 Ohm
Lead Channel Impedance Value: 1178 Ohm
Lead Channel Impedance Value: 342 Ohm
Lead Channel Impedance Value: 399 Ohm
Lead Channel Impedance Value: 418 Ohm
Lead Channel Impedance Value: 456 Ohm
Lead Channel Impedance Value: 475 Ohm
Lead Channel Impedance Value: 475 Ohm
Lead Channel Impedance Value: 513 Ohm
Lead Channel Impedance Value: 513 Ohm
Lead Channel Impedance Value: 684 Ohm
Lead Channel Impedance Value: 741 Ohm
Lead Channel Impedance Value: 836 Ohm
Lead Channel Pacing Threshold Amplitude: 0.625 V
Lead Channel Pacing Threshold Amplitude: 0.625 V
Lead Channel Pacing Threshold Amplitude: 2.125 V
Lead Channel Pacing Threshold Pulse Width: 0.4 ms
Lead Channel Pacing Threshold Pulse Width: 0.4 ms
Lead Channel Pacing Threshold Pulse Width: 0.8 ms
Lead Channel Sensing Intrinsic Amplitude: 2.375 mV
Lead Channel Sensing Intrinsic Amplitude: 2.375 mV
Lead Channel Sensing Intrinsic Amplitude: 5.875 mV
Lead Channel Sensing Intrinsic Amplitude: 5.875 mV
Lead Channel Setting Pacing Amplitude: 1.5 V
Lead Channel Setting Pacing Amplitude: 2.5 V
Lead Channel Setting Pacing Amplitude: 3.25 V
Lead Channel Setting Pacing Pulse Width: 0.4 ms
Lead Channel Setting Pacing Pulse Width: 0.8 ms
Lead Channel Setting Sensing Sensitivity: 1.2 mV
Zone Setting Status: 755011

## 2024-06-11 NOTE — Progress Notes (Signed)
 Remote PPM Transmission

## 2024-06-16 ENCOUNTER — Ambulatory Visit: Payer: Self-pay | Admitting: Cardiology

## 2024-07-10 ENCOUNTER — Encounter: Payer: Self-pay | Admitting: Physician Assistant

## 2024-07-10 ENCOUNTER — Ambulatory Visit: Admitting: Physician Assistant

## 2024-07-10 ENCOUNTER — Other Ambulatory Visit: Payer: Self-pay

## 2024-07-10 DIAGNOSIS — M7021 Olecranon bursitis, right elbow: Secondary | ICD-10-CM | POA: Diagnosis not present

## 2024-07-10 MED ORDER — DOXYCYCLINE HYCLATE 100 MG PO TABS: 100.0000 mg | ORAL_TABLET | Freq: Two times a day (BID) | ORAL | 0 refills | Status: AC

## 2024-07-10 NOTE — Progress Notes (Signed)
" ° °  Procedure Note  Patient: Steven Macdonald             Date of Birth: [birth year]           MRN: [id number]             Visit Date: 07/10/2024 HPI: Mr. Eskelson is well-known to Dr. Vernetta service.  Comes in with recurrent right elbow bursitis.  We last saw him on 05/12/2024 and the elbow olecranon bursitis was aspirated and patient did well until about 2 weeks ago when he developed some swelling over the right olecranon bursa region.  He has had no known injury.  States has had some drainage over the last few days and some swelling into the forearm.  Also notes some redness about the forearm.  He denies any fevers chills.  Review of systems: See HPI.  Temperature 97.7 F General: Well-developed well-nourished male no acute distress.  Affect appropriate Right elbow good range of motion.  Significant erythema and swelling over the olecranon bursa region.  Some slight redness into the forearm.  Right elbow lateral view: Subacute anterior postsurgical.  No evidence of osteomyelitis.  Mild osteoarthritis of the elbow.  Procedures: Visit Diagnoses:  1. Olecranon bursitis, right elbow     Incision & Drainage  Date/Time: 07/10/2024 5:31 PM  Performed by: Gretta Bertrum ORN, PA-C Authorized by: Gretta Bertrum ORN, PA-C   Consent:    Consent obtained:  Verbal   Consent given by:  Patient   Risks, benefits, and alternatives were discussed: yes     Risks discussed:  Pain and incomplete drainage   Alternatives discussed:  Alternative treatment Location:    Type:  Bursa   Location:  Upper extremity   Upper extremity location:  Elbow   Elbow location:  R elbow Pre-procedure details:    Skin preparation:  Povidone-iodine Sedation:    Sedation type:  None Anesthesia:    Anesthesia method:  Local infiltration   Local anesthetic:  Lidocaine  1% w/o epi Procedure type:    Complexity:  Simple Procedure details:    Needle aspiration: no     Incision types:  Single straight   Incision depth:   Subcutaneous   Scalpel blade:  10   Wound management:  Irrigated with saline   Drainage:  Bloody and serosanguinous   Drainage amount:  Moderate   Wound treatment:  Wound left open   Packing materials:  None Post-procedure details:    Procedure completion:  Tolerated well, no immediate complications   Plan: The wound was dressed with 4 x 4's ABD Curlex and Ace bandage.  He will leave the dressing clean dry and intact until follow-up on this coming Monday.  Questions were encouraged and answered.  We did place him on doxycycline  100 mg twice daily for 14 days.  Radiograph of the elbow was obtained today as patient was worried about any kind of bony infection.  Radiographs were reviewed.  Radiographs showed no evidence of osteomyelitis.  "

## 2024-07-14 ENCOUNTER — Ambulatory Visit: Admitting: Physician Assistant

## 2024-07-16 ENCOUNTER — Ambulatory Visit: Admitting: Physician Assistant

## 2024-07-16 ENCOUNTER — Encounter: Payer: Self-pay | Admitting: Physician Assistant

## 2024-07-16 DIAGNOSIS — M7021 Olecranon bursitis, right elbow: Secondary | ICD-10-CM

## 2024-07-16 NOTE — Progress Notes (Signed)
 HPI: Patient status post I&D right elbow olecranon bursitis.  Now 6 days status post procedure.  He has had no adverse events taking the doxycycline .  Denies any fevers chills.  Physical exam:  General: No acute distress. Right elbow dressings removed.  Decreased erythema over the right olecranon bursa region.  No active drainage.  Good range of motion of the elbow.  Impression: 6-day status post right elbow I&D olecranon bursitis  Plan: He will continue compression of the right elbow with dressings that are given.  Will place a 4 x 4 pad and then an Ace wrap about the elbow for compression purposes.  He can remove this for hygiene purposes.  Follow-up with Dr. Vernetta in 1 week see how he is doing overall.  Continue his doxycycline  until finished.  Questions were encouraged and answered.

## 2024-07-24 ENCOUNTER — Encounter: Payer: Self-pay | Admitting: Orthopaedic Surgery

## 2024-07-24 ENCOUNTER — Ambulatory Visit: Admitting: Orthopaedic Surgery

## 2024-07-24 DIAGNOSIS — M7021 Olecranon bursitis, right elbow: Secondary | ICD-10-CM

## 2024-07-24 NOTE — Progress Notes (Signed)
 The patient is an 89 year old gentleman who is following up after having an in office procedure on his right elbow olecranon bursitis.  Tory was able to wash it out in the office last week.  Has been on antibiotics and having a wrap around it.  On exam it looks much better overall.  There is no redness and there is no significant fluid collection at all.  There is no drainage either.  There is a small wound that will likely heal with time.  I am going to have him place a small amount of mupirocin  ointment on the wound daily with a Band-Aid.  Will see him back in about 3 weeks to see how he is doing overall but overall he does look better.

## 2024-08-01 ENCOUNTER — Ambulatory Visit: Admitting: Cardiology

## 2024-08-14 ENCOUNTER — Ambulatory Visit: Admitting: Physician Assistant

## 2024-09-08 ENCOUNTER — Encounter

## 2024-12-08 ENCOUNTER — Encounter

## 2025-03-09 ENCOUNTER — Encounter
